# Patient Record
Sex: Male | Born: 1965
Health system: Southern US, Community
[De-identification: ages and names within clinical notes are randomized; demographics above are authoritative.]

## PROBLEM LIST (undated history)

## (undated) DIAGNOSIS — C801 Malignant (primary) neoplasm, unspecified: Secondary | ICD-10-CM

## (undated) DIAGNOSIS — I10 Essential (primary) hypertension: Secondary | ICD-10-CM

## (undated) DIAGNOSIS — C649 Malignant neoplasm of unspecified kidney, except renal pelvis: Secondary | ICD-10-CM

## (undated) DIAGNOSIS — G4730 Sleep apnea, unspecified: Secondary | ICD-10-CM

## (undated) DIAGNOSIS — N289 Disorder of kidney and ureter, unspecified: Secondary | ICD-10-CM

## (undated) DIAGNOSIS — E039 Hypothyroidism, unspecified: Secondary | ICD-10-CM

## (undated) HISTORY — DX: Disorder of kidney and ureter, unspecified: N28.9

## (undated) HISTORY — PX: TONSILLECTOMY: SUR1361

---

## 2019-08-05 HISTORY — PX: OTHER SURGICAL HISTORY: SHX169

## 2019-11-05 HISTORY — PX: LUNG BIOPSY: SHX232

## 2019-12-08 IMAGING — CT PET^[id number]_[ID] (ADULT)
1 of 3 series · 5 of 20 positions shown, 7 images · non-contrast
Comparison: none

____________________________________________

CEOLA [DATE] CEOLA [DATE]
REASON FOR CONSULTATION: F/U PE [PHONE_NUMBER]
EXAM: CHEST ROUTINE
No significant interval changes are seen as compared to a prior exam of
[DATE].
Again noted are multiple rib fracture deformities on the LEFT. A
moderate LEFT pleural effusion or LEFT hemothorax persists but does not
appear to have changed appreciably. Also noted is a small pneumothorax
on the RIGHT.

[Series 4: ct wb · axial · 0.98mm/px · z∈[+514,+1159]mm · 5 of 323 slices shown, 7 images]
[im 54/323  soft-tissue]
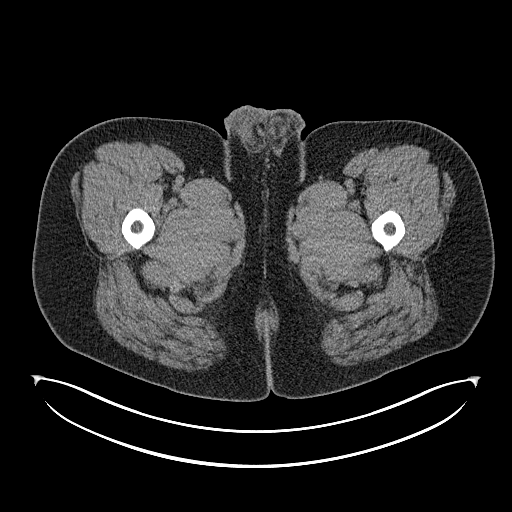
[im 54/323  bone]
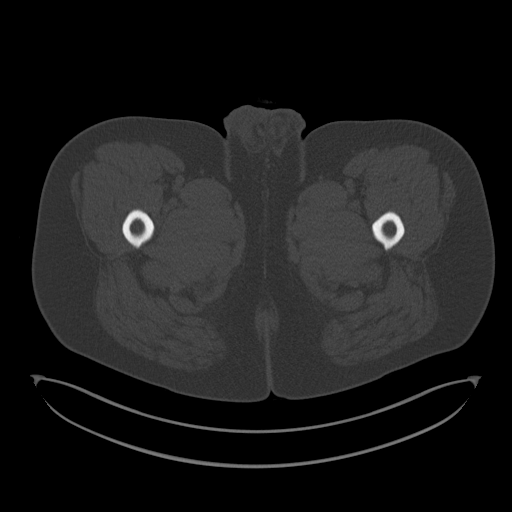
[im 108/323  bone]
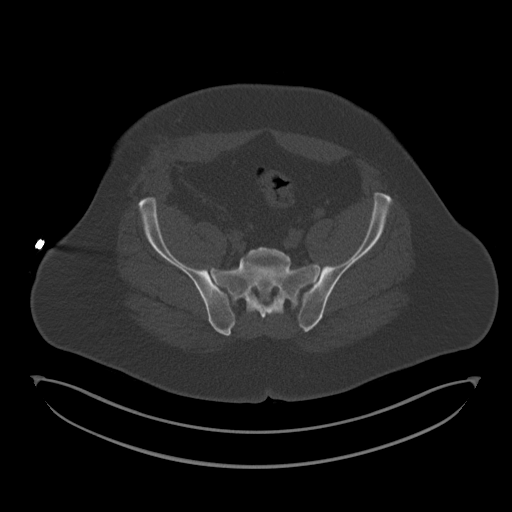
[im 162/323  bone]
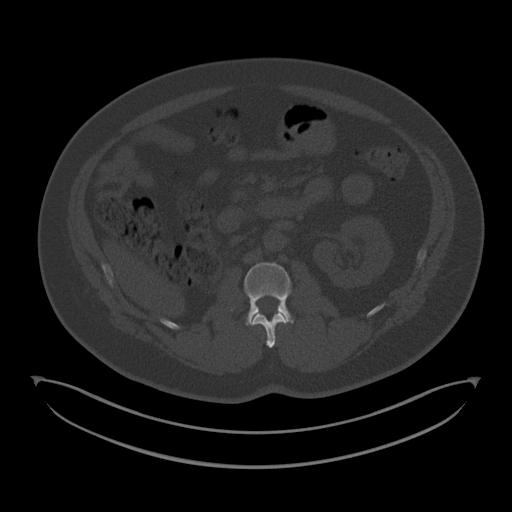
[im 215/323  bone]
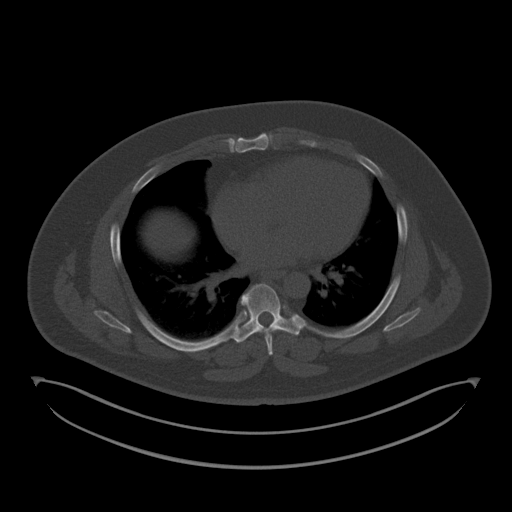
[im 269/323  soft-tissue]
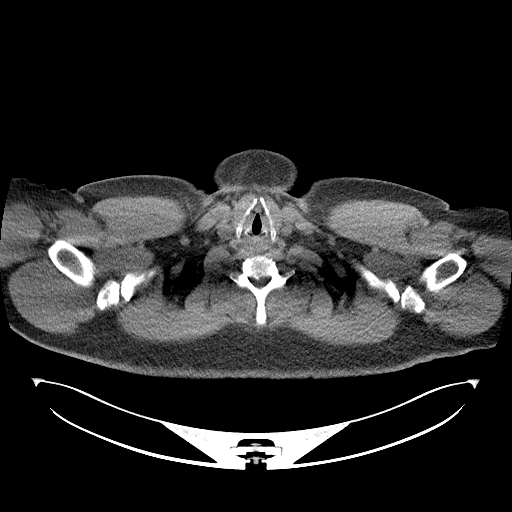
[im 269/323  bone]
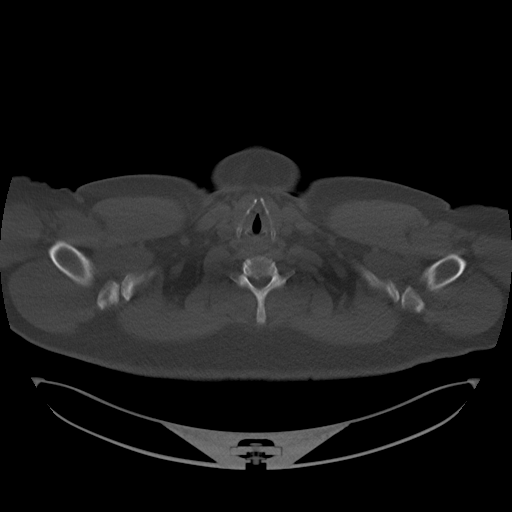

[5 of 20 positions shown; findings below may reference images not displayed]

CONCLUSION: Stable appearing chest as compared to the exam of 2 days
 ago.
 Multiple LEFT rib fractures are again seen.
 There are bilateral effusions with that on the LEFT being larger.

## 2020-05-26 ENCOUNTER — Telehealth: Payer: Self-pay | Admitting: Oncology

## 2020-05-26 NOTE — Telephone Encounter (Signed)
Received a new pt referral from [facility] in [place] for renal cell carcinoma. Michael [name] has been cld and scheduled to see Dr. Alen Blew on 7/29 at 2pm. Pt aware to arrive 15 minutes early.

## 2020-06-01 ENCOUNTER — Other Ambulatory Visit: Payer: Self-pay

## 2020-06-01 ENCOUNTER — Inpatient Hospital Stay: Payer: BC Managed Care – PPO | Attending: Oncology | Admitting: Oncology

## 2020-06-01 VITALS — BP 142/91 | HR 76 | Temp 97.9°F | Resp 18 | Ht 66.0 in | Wt 250.0 lb

## 2020-06-01 DIAGNOSIS — C649 Malignant neoplasm of unspecified kidney, except renal pelvis: Secondary | ICD-10-CM | POA: Diagnosis not present

## 2020-06-01 MED ORDER — PREDNISONE 5 MG PO TABS
5.0000 mg | ORAL_TABLET | Freq: Every day | ORAL | 0 refills | Status: DC
Start: 2020-06-01 — End: 2021-05-17

## 2020-06-01 NOTE — Progress Notes (Signed)
Reason for the request:    Renal cell carcinoma  HPI: I was asked by Dr. Michel Harrow to evaluate [name] for the diagnosis of advanced renal cell carcinoma.  He is a 54 year old man found to have a right kidney mass on a CT scan in September 2020.  At that time the right kidney mass was measuring 7.7 x 7.5 x 4.8 cm with central necrosis.  He underwent a right nephrectomy on August 27, 2019 performed while he was living in [place].  The final pathology showed clear cell renal cell carcinoma, nuclear grade 3 with 10% necrosis negative margins.  Final pathological stage was T2ANX.  He remained on active surveillance at that time and developed lymphadenopathy based on his chest CT scan in January 2021.  He had a right paratracheal and bilateral hilar adenopathy.  PET CT scan in February 2021 showed FDG avid disease although inflammation could not be ruled out.  He underwent a bronchoscopy and EBUS and biopsy at that time showed a metastatic renal cell carcinoma.  CT scan in March 2021 showed a 1.2 cm perihilar left lower lobe nodule multiple small pulmonary nodules as well as his lymphadenopathy.  On January 28, 2020 he was started on axitinib 5 mg twice daily with Pembrolizumab.  On April 16 he was evaluated and found to have elevated liver function tests and treatment was withheld at that time.  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  In June 2021 his AST was down to 31 ALT of 77 and was prednisone was tapered almost to off.  He relocated from [place] to this area and here to establish care.   Clinically, he reports no major complaints at this time.  He denies any nausea, vomiting or abdominal pain.  He denies any worsening shortness of breath or difficulty breathing.  He does not report any headaches, blurry vision, syncope or seizures. Does not report any fevers, chills or sweats.  Does not report any cough, wheezing or hemoptysis.  Does not report any chest  pain, palpitation, orthopnea or leg edema.  Does not report any nausea, vomiting or abdominal pain.  Does not report any constipation or diarrhea.  Does not report any skeletal complaints.    Does not report frequency, urgency or hematuria.  Does not report any skin rashes or lesions. Does not report any heat or cold intolerance.  Does not report any lymphadenopathy or petechiae.  Does not report any anxiety or depression.  Remaining review of systems is negative.    Past medical history significant for hypertension and hypothyroidism.     Medication: He is currently on amlodipine, prednisone 10 mg and levothyroxine.    No family with malignancy or blood disorder.  Social History   Socioeconomic History  . Marital status: Married    Spouse name: Not on file  . Number of children: Not on file  . Years of education: Not on file  . Highest education level: Not on file  Occupational History  . Not on file  Tobacco Use  . Smoking status: Not on file  Substance and Sexual Activity  . Alcohol use: Not on file  . Drug use: Not on file  . Sexual activity: Not on file  Other Topics Concern  . Not on file  Social History Narrative  . Not on file   Social Determinants of Health   Financial Resource Strain:   . Difficulty of Paying Living Expenses:   Food Insecurity:   .  Worried About Charity fundraiser in the Last Year:   . Arboriculturist in the Last Year:   Transportation Needs:   . Film/video editor (Medical):   Marland Kitchen Lack of Transportation (Non-Medical):   Physical Activity:   . Days of Exercise per Week:   . Minutes of Exercise per Session:   Stress:   . Feeling of Stress :   Social Connections:   . Frequency of Communication with Friends and Family:   . Frequency of Social Gatherings with Friends and Family:   . Attends Religious Services:   . Active Member of Clubs or Organizations:   . Attends Archivist Meetings:   Marland Kitchen Marital Status:   Intimate Partner  Violence:   . Fear of Current or Ex-Partner:   . Emotionally Abused:   Marland Kitchen Physically Abused:   . Sexually Abused:   :  Pertinent items are noted in HPI.  Exam: Blood pressure (!) 142/91, pulse 76, temperature 97.9 F (36.6 C), temperature source Temporal, resp. rate 18, height 5\' 6"  (1.676 m), weight (!) 250 lb (113.4 kg), SpO2 98 %.  ECOG 0 General appearance: alert and cooperative appeared without distress. Head: atraumatic without any abnormalities. Eyes: conjunctivae/corneas clear. PERRL.  Sclera anicteric. Throat: lips, mucosa, and tongue normal; without oral thrush or ulcers. Resp: clear to auscultation bilaterally without rhonchi, wheezes or dullness to percussion. Cardio: regular rate and rhythm, S1, S2 normal, no murmur, click, rub or gallop GI: soft, non-tender; bowel sounds normal; no masses,  no organomegaly Skin: Skin color, texture, turgor normal. No rashes or lesions Lymph nodes: Cervical, supraclavicular, and axillary nodes normal. Neurologic: Grossly normal without any motor, sensory or deep tendon reflexes. Musculoskeletal: No joint deformity or effusion.    Assessment and Plan:   54 year old with:  1.  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  He was initially diagnosed with large kidney mass and nephrectomy in October 2020 showed clear-cell histology with localized disease and T2 lesion.  He was started therapy with axitinib and Pembrolizumab although therapy was interrupted in April 2021 after increase in his liver function tests of therapy was withheld and was treated with prednisone taper.  The natural course of this disease and treatment options were discussed.  His options would include restarting his therapy with Pembrolizumab alone, Pembrolizumab and axitinib, axitinib alone or combination of immunotherapy with ipilimumab and nivolumab.     Risks and benefits of all these options were discussed at this time and after discussion, the plan is to repeat imaging studies to update his staging work-up.  If his liver function tests has not normalized I will be open to rechallenging him with Pembrolizumab and lower dose of axitinib.  He did experience hand-foot syndrome as well as mucositis and weight loss associated with axitinib and will consider restarting it at a lower dose pending the results of the scan.   2.  Immune mediated complications: He did develop hepatitis which appears to have resolved at this time.  I have recommended tapering his prednisone completely.  He will take 5 mg daily for a week and then discontinue.  Other complications that include pneumonitis, colitis, thyroid disease will be monitored periodically.  3.  Hypertension: He is currently on amlodipine blood pressure manageable at this time.  4.  Goals of care and prognosis: Therapy is palliative at this time.  Curative options do not exist.  Aggressive measures are warranted given his excellent performance status.  5.  Follow-up: Will be in the near future after completing his staging work-up.  60  minutes were dedicated to this visit. The time was spent on reviewing laboratory data, imaging studies, discussing treatment options, and answering questions regarding future plan.     A copy of this consult has been forwarded to the requesting physician.

## 2020-06-02 ENCOUNTER — Telehealth: Payer: Self-pay | Admitting: Oncology

## 2020-06-02 NOTE — Telephone Encounter (Signed)
Scheduled appointment per 7/30 scheduling message. Patient is aware of appointment date and time.

## 2020-06-08 ENCOUNTER — Other Ambulatory Visit: Payer: BC Managed Care – PPO

## 2020-06-12 ENCOUNTER — Other Ambulatory Visit: Payer: Self-pay

## 2020-06-12 ENCOUNTER — Ambulatory Visit: Payer: BC Managed Care – PPO | Admitting: Oncology

## 2020-06-12 ENCOUNTER — Inpatient Hospital Stay: Payer: BC Managed Care – PPO | Attending: Oncology

## 2020-06-12 DIAGNOSIS — J984 Other disorders of lung: Secondary | ICD-10-CM | POA: Insufficient documentation

## 2020-06-12 DIAGNOSIS — C649 Malignant neoplasm of unspecified kidney, except renal pelvis: Secondary | ICD-10-CM

## 2020-06-12 DIAGNOSIS — C641 Malignant neoplasm of right kidney, except renal pelvis: Secondary | ICD-10-CM | POA: Insufficient documentation

## 2020-06-12 DIAGNOSIS — Z905 Acquired absence of kidney: Secondary | ICD-10-CM | POA: Insufficient documentation

## 2020-06-12 DIAGNOSIS — Z880 Allergy status to penicillin: Secondary | ICD-10-CM | POA: Diagnosis not present

## 2020-06-12 DIAGNOSIS — R7989 Other specified abnormal findings of blood chemistry: Secondary | ICD-10-CM | POA: Diagnosis not present

## 2020-06-12 DIAGNOSIS — I700 Atherosclerosis of aorta: Secondary | ICD-10-CM | POA: Diagnosis not present

## 2020-06-12 DIAGNOSIS — R911 Solitary pulmonary nodule: Secondary | ICD-10-CM | POA: Insufficient documentation

## 2020-06-12 DIAGNOSIS — K754 Autoimmune hepatitis: Secondary | ICD-10-CM | POA: Insufficient documentation

## 2020-06-12 DIAGNOSIS — I10 Essential (primary) hypertension: Secondary | ICD-10-CM | POA: Insufficient documentation

## 2020-06-12 DIAGNOSIS — Z79899 Other long term (current) drug therapy: Secondary | ICD-10-CM | POA: Diagnosis not present

## 2020-06-12 LAB — CMP (CANCER CENTER ONLY)
ALT: 30 U/L (ref 0–44)
AST: 26 U/L (ref 15–41)
Albumin: 3.8 g/dL (ref 3.5–5.0)
Alkaline Phosphatase: 78 U/L (ref 38–126)
Anion gap: 7 (ref 5–15)
BUN: 14 mg/dL (ref 6–20)
CO2: 28 mmol/L (ref 22–32)
Calcium: 9.4 mg/dL (ref 8.9–10.3)
Chloride: 106 mmol/L (ref 98–111)
Creatinine: 1.49 mg/dL — ABNORMAL HIGH (ref 0.61–1.24)
GFR, Est AFR Am: >60 mL/min (ref >60)
GFR, Estimated: 53 mL/min — ABNORMAL LOW (ref >60)
Glucose, Bld: 94 mg/dL (ref 70–99)
Potassium: 4.3 mmol/L (ref 3.5–5.1)
Sodium: 141 mmol/L (ref 135–145)
Total Bilirubin: 0.8 mg/dL (ref 0.3–1.2)
Total Protein: 6.8 g/dL (ref 6.5–8.1)

## 2020-06-12 LAB — CBC WITH DIFFERENTIAL (CANCER CENTER ONLY)
Abs Immature Granulocytes: 0.04 10*3/uL (ref 0.00–0.07)
Basophils Absolute: 0 10*3/uL (ref 0.0–0.1)
Basophils Relative: 1 %
Eosinophils Absolute: 0.2 10*3/uL (ref 0.0–0.5)
Eosinophils Relative: 3 %
HCT: 43.8 % (ref 39.0–52.0)
Hemoglobin: 14.7 g/dL (ref 13.0–17.0)
Immature Granulocytes: 1 %
Lymphocytes Relative: 33 %
Lymphs Abs: 2.4 10*3/uL (ref 0.7–4.0)
MCH: 30.8 pg (ref 26.0–34.0)
MCHC: 33.6 g/dL (ref 30.0–36.0)
MCV: 91.8 fL (ref 80.0–100.0)
Monocytes Absolute: 0.6 10*3/uL (ref 0.1–1.0)
Monocytes Relative: 9 %
Neutro Abs: 4 10*3/uL (ref 1.7–7.7)
Neutrophils Relative %: 53 %
Platelet Count: 222 10*3/uL (ref 150–400)
RBC: 4.77 MIL/uL (ref 4.22–5.81)
RDW: 15.1 % (ref 11.5–15.5)
WBC Count: 7.3 10*3/uL (ref 4.0–10.5)
nRBC: 0 % (ref 0.0–0.2)

## 2020-06-12 LAB — TSH: TSH: 82.301 u[IU]/mL — ABNORMAL HIGH (ref 0.320–4.118)

## 2020-06-13 ENCOUNTER — Ambulatory Visit (HOSPITAL_COMMUNITY)
Admission: RE | Admit: 2020-06-13 | Discharge: 2020-06-13 | Disposition: A | Discharge status: Home / Self Care | Payer: BC Managed Care – PPO | Source: Ambulatory Visit | Attending: Oncology | Admitting: Oncology

## 2020-06-13 ENCOUNTER — Encounter (HOSPITAL_COMMUNITY): Payer: Self-pay

## 2020-06-13 DIAGNOSIS — R918 Other nonspecific abnormal finding of lung field: Secondary | ICD-10-CM | POA: Diagnosis not present

## 2020-06-13 DIAGNOSIS — C7800 Secondary malignant neoplasm of unspecified lung: Secondary | ICD-10-CM | POA: Diagnosis not present

## 2020-06-13 DIAGNOSIS — C649 Malignant neoplasm of unspecified kidney, except renal pelvis: Secondary | ICD-10-CM | POA: Insufficient documentation

## 2020-06-13 DIAGNOSIS — K7689 Other specified diseases of liver: Secondary | ICD-10-CM | POA: Diagnosis not present

## 2020-06-13 DIAGNOSIS — R590 Localized enlarged lymph nodes: Secondary | ICD-10-CM | POA: Diagnosis not present

## 2020-06-13 DIAGNOSIS — I700 Atherosclerosis of aorta: Secondary | ICD-10-CM | POA: Diagnosis not present

## 2020-06-13 MED ORDER — IOHEXOL 300 MG/ML  SOLN
100.0000 mL | Freq: Once | INTRAMUSCULAR | Status: AC | PRN
  Administered 2020-06-13: 100 mL via INTRAVENOUS

## 2020-06-13 MED ORDER — SODIUM CHLORIDE (PF) 0.9 % IJ SOLN
INTRAMUSCULAR | Status: AC
  Filled 2020-06-13: qty 50

## 2020-06-14 ENCOUNTER — Other Ambulatory Visit: Payer: Self-pay

## 2020-06-14 ENCOUNTER — Inpatient Hospital Stay (HOSPITAL_BASED_OUTPATIENT_CLINIC_OR_DEPARTMENT_OTHER): Payer: BC Managed Care – PPO | Admitting: Oncology

## 2020-06-14 VITALS — BP 136/83 | HR 85 | Temp 97.8°F | Resp 18 | Ht 66.0 in | Wt 258.5 lb

## 2020-06-14 DIAGNOSIS — C649 Malignant neoplasm of unspecified kidney, except renal pelvis: Secondary | ICD-10-CM | POA: Diagnosis not present

## 2020-06-14 DIAGNOSIS — Z905 Acquired absence of kidney: Secondary | ICD-10-CM | POA: Diagnosis not present

## 2020-06-14 DIAGNOSIS — K754 Autoimmune hepatitis: Secondary | ICD-10-CM | POA: Diagnosis not present

## 2020-06-14 DIAGNOSIS — I10 Essential (primary) hypertension: Secondary | ICD-10-CM | POA: Diagnosis not present

## 2020-06-14 DIAGNOSIS — R911 Solitary pulmonary nodule: Secondary | ICD-10-CM | POA: Diagnosis not present

## 2020-06-14 DIAGNOSIS — I700 Atherosclerosis of aorta: Secondary | ICD-10-CM | POA: Diagnosis not present

## 2020-06-14 DIAGNOSIS — Z880 Allergy status to penicillin: Secondary | ICD-10-CM | POA: Diagnosis not present

## 2020-06-14 DIAGNOSIS — R7989 Other specified abnormal findings of blood chemistry: Secondary | ICD-10-CM | POA: Diagnosis not present

## 2020-06-14 DIAGNOSIS — J984 Other disorders of lung: Secondary | ICD-10-CM | POA: Diagnosis not present

## 2020-06-14 DIAGNOSIS — C641 Malignant neoplasm of right kidney, except renal pelvis: Secondary | ICD-10-CM | POA: Diagnosis not present

## 2020-06-14 DIAGNOSIS — Z79899 Other long term (current) drug therapy: Secondary | ICD-10-CM | POA: Diagnosis not present

## 2020-06-14 MED ORDER — LEVOTHYROXINE SODIUM 75 MCG PO TABS
75.0000 ug | ORAL_TABLET | Freq: Every morning | ORAL | 3 refills | Status: DC
Start: 2020-06-14 — End: 2020-08-31

## 2020-06-14 NOTE — Progress Notes (Addendum)
Hematology and Oncology Follow Up Visit  Michael [name] [id number] 12-28-1965 54 y.o. 06/14/2020 3:40 PM System, Pcp Not InNo ref. provider found   Principle Diagnosis: 54 year old man with stage IV clear-cell renal cell carcinoma documented in March 2021.  He was found to have hilar adenopathy and pulmonary nodules.  He was initially diagnosed with localized disease in 2020.   Prior Therapy: He underwent a right nephrectomy on August 27, 2019 performed while he was living in [place].  The final pathology showed clear cell renal cell carcinoma, nuclear grade 3 with 10% necrosis negative margins.  Final pathological stage was T2ANX.  He is status post Pembrolizumab and axitinib started in March 2021.  He received two cycles of therapy.  Therapy was interrupted because of increased LFTs and autoimmune hepatitis.    He has been receiving his therapy in [place] and relocated to this area.  Current therapy: Under evaluation to start therapy.  Interim History: Michael [name] returns today for a follow-up visit.  Since last visit, he reports no major changes in his health.  He denies any recent nausea, fatigue or shortness of breath.  He denies any abdominal pain or dyspnea exertion.  His performance status and quality of life intact.     Medications: I have reviewed the patient's current medications.  Current Outpatient Medications  Medication Sig Dispense Refill  . amLODipine (NORVASC) 10 MG tablet Take 10 mg by mouth daily.    . Levothyroxine Sodium (SYNTHROID PO) Take 0.5 mg by mouth every morning.    . montelukast (SINGULAIR) 10 MG tablet Take 10 mg by mouth at bedtime.    . predniSONE (DELTASONE) 5 MG tablet Take 1 tablet (5 mg total) by mouth daily with breakfast. 7 tablet 0  . tadalafil (CIALIS) 20 MG tablet Take 20 mg by mouth daily as needed for erectile dysfunction.     No current facility-administered medications for this visit.     Allergies:  Allergies  Allergen Reactions  .  Penicillins       Physical Exam: Blood pressure 136/83, pulse 85, temperature 97.8 F (36.6 C), temperature source Axillary, resp. rate 18, height 5\' 6"  (1.676 m), weight 258 lb 8 oz (117.3 kg), SpO2 98 %.   ECOG: 0   General appearance: Comfortable appearing without any discomfort Head: Normocephalic without any trauma Oropharynx: Mucous membranes are moist and pink without any thrush or ulcers. Eyes: Pupils are equal and round reactive to light. Lymph nodes: No cervical, supraclavicular, inguinal or axillary lymphadenopathy.   Heart:regular rate and rhythm.  S1 and S2 without leg edema. Lung: Clear without any rhonchi or wheezes.  No dullness to percussion. Abdomin: Soft, nontender, nondistended with good bowel sounds.  No hepatosplenomegaly. Musculoskeletal: No joint deformity or effusion.  Full range of motion noted. Neurological: No deficits noted on motor, sensory and deep tendon reflex exam. Skin: No petechial rash or dryness.  Appeared moist.      Lab Results: Lab Results  Component Value Date   WBC 7.3 06/12/2020   HGB 14.7 06/12/2020   HCT 43.8 06/12/2020   MCV 91.8 06/12/2020   PLT 222 06/12/2020     Chemistry      Component Value Date/Time   NA 141 06/12/2020 0835   K 4.3 06/12/2020 0835   CL 106 06/12/2020 0835   CO2 28 06/12/2020 0835   BUN 14 06/12/2020 0835   CREATININE 1.49 (H) 06/12/2020 0835      Component Value Date/Time   CALCIUM 9.4 06/12/2020 0835  ALKPHOS 78 06/12/2020 0835   AST 26 06/12/2020 0835   ALT 30 06/12/2020 0835   BILITOT 0.8 06/12/2020 0835       Radiological Studies: CT Abdomen Pelvis W Wo Contrast  Result Date: 06/13/2020 CLINICAL DATA:  Right renal cell carcinoma, status post right nephrectomy. Pulmonary metastases. Previous care in [place] with no comparative imaging. EXAM: CT CHEST WITH CONTRAST CT ABDOMEN AND PELVIS WITH AND WITHOUT CONTRAST TECHNIQUE: Multidetector CT imaging of the chest was performed during  intravenous contrast administration. Multidetector CT imaging of the abdomen and pelvis was performed following the standard protocol before and during bolus administration of intravenous contrast. CONTRAST:  177mL OMNIPAQUE IOHEXOL 300 MG/ML  SOLN COMPARISON:  None. FINDINGS: CT CHEST FINDINGS Cardiovascular: Heart size upper normal. No pericardial effusion. Atherosclerotic calcification is noted in the wall of the thoracic aorta. Mediastinum/Nodes: No mediastinal lymphadenopathy. 14 mm short axis left inferior hilar lymph node visible on 52/11. Small lymph nodes are noted in the right hilum including 10 mm short axis right hilar node on 56/11. The esophagus has normal imaging features. There is no axillary lymphadenopathy. Lungs/Pleura: 4 mm anterior right upper lobe nodule visible on 55/15. 2 mm right middle lobe nodule identified on 90/15. 6 mm right lower lobe nodule identified on 102/15. 4 mm left lower lobe nodule identified on 01/30 2/15. No focal airspace consolidation No pleural effusion. Musculoskeletal: No worrisome lytic or sclerotic osseous abnormality. CT ABDOMEN AND PELVIS FINDINGS Hepatobiliary: Multiple low-density hepatic lesions are evident ranging in size from several mm up to about 2.8 cm. The more dominant lesions approach water density and are compatible with cysts. The smaller lesions are too small to characterize but likely benign. No overtly suspicious enhancing abnormality within the liver parenchyma. There is no evidence for gallstones, gallbladder wall thickening, or pericholecystic fluid. No intrahepatic or extrahepatic biliary dilation. Pancreas: No focal mass lesion. No dilatation of the main duct. No intraparenchymal cyst. No peripancreatic edema. Spleen: No splenomegaly. No focal mass lesion. Adrenals/Urinary Tract: No adrenal nodule or mass. Right nephrectomy without unexpected or suspicious soft tissue in the nephrectomy bed. Left kidney unremarkable. Left ureter has normal CT  imaging features. The urinary bladder appears normal for the degree of distention. Stomach/Bowel: Stomach is unremarkable. No gastric wall thickening. No evidence of outlet obstruction. Duodenum is normally positioned as is the ligament of Treitz. No small bowel wall thickening. No small bowel dilatation. The terminal ileum is normal. The appendix is normal. No gross colonic mass. No colonic wall thickening. Vascular/Lymphatic: No abdominal aortic aneurysm. No abdominal aortic atherosclerotic calcification. There is no gastrohepatic or hepatoduodenal ligament lymphadenopathy. No retroperitoneal or mesenteric lymphadenopathy. No pelvic sidewall lymphadenopathy. Reproductive: The prostate gland and seminal vesicles are unremarkable. Other: No intraperitoneal free fluid. Musculoskeletal: No worrisome lytic or sclerotic osseous abnormality. IMPRESSION: 1. Multiple tiny bilateral pulmonary nodules measuring up to 6 mm. Given the history of renal cell carcinoma, metastatic disease is a consideration. 2. Mild left hilar lymphadenopathy, indeterminate. Small right hilar and mediastinal lymph nodes do not meet CT size criteria for pathologic enlargement. 3. Status post right nephrectomy without evidence for local recurrence. 4. Multiple low-density hepatic lesions, most consistent with cysts. The smaller lesions are too small to characterize but likely benign. Attention on follow-up recommended. 5. Aortic Atherosclerosis (ICD10-I70.0). Electronically Signed   By: Misty Stanley M.D.   On: 06/13/2020 09:36   CT Chest W Contrast  Result Date: 06/13/2020 CLINICAL DATA:  Right renal cell carcinoma, status post right nephrectomy. Pulmonary  metastases. Previous care in [place] with no comparative imaging. EXAM: CT CHEST WITH CONTRAST CT ABDOMEN AND PELVIS WITH AND WITHOUT CONTRAST TECHNIQUE: Multidetector CT imaging of the chest was performed during intravenous contrast administration. Multidetector CT imaging of the abdomen  and pelvis was performed following the standard protocol before and during bolus administration of intravenous contrast. CONTRAST:  14mL OMNIPAQUE IOHEXOL 300 MG/ML  SOLN COMPARISON:  None. FINDINGS: CT CHEST FINDINGS Cardiovascular: Heart size upper normal. No pericardial effusion. Atherosclerotic calcification is noted in the wall of the thoracic aorta. Mediastinum/Nodes: No mediastinal lymphadenopathy. 14 mm short axis left inferior hilar lymph node visible on 52/11. Small lymph nodes are noted in the right hilum including 10 mm short axis right hilar node on 56/11. The esophagus has normal imaging features. There is no axillary lymphadenopathy. Lungs/Pleura: 4 mm anterior right upper lobe nodule visible on 55/15. 2 mm right middle lobe nodule identified on 90/15. 6 mm right lower lobe nodule identified on 102/15. 4 mm left lower lobe nodule identified on 01/30 2/15. No focal airspace consolidation No pleural effusion. Musculoskeletal: No worrisome lytic or sclerotic osseous abnormality. CT ABDOMEN AND PELVIS FINDINGS Hepatobiliary: Multiple low-density hepatic lesions are evident ranging in size from several mm up to about 2.8 cm. The more dominant lesions approach water density and are compatible with cysts. The smaller lesions are too small to characterize but likely benign. No overtly suspicious enhancing abnormality within the liver parenchyma. There is no evidence for gallstones, gallbladder wall thickening, or pericholecystic fluid. No intrahepatic or extrahepatic biliary dilation. Pancreas: No focal mass lesion. No dilatation of the main duct. No intraparenchymal cyst. No peripancreatic edema. Spleen: No splenomegaly. No focal mass lesion. Adrenals/Urinary Tract: No adrenal nodule or mass. Right nephrectomy without unexpected or suspicious soft tissue in the nephrectomy bed. Left kidney unremarkable. Left ureter has normal CT imaging features. The urinary bladder appears normal for the degree of  distention. Stomach/Bowel: Stomach is unremarkable. No gastric wall thickening. No evidence of outlet obstruction. Duodenum is normally positioned as is the ligament of Treitz. No small bowel wall thickening. No small bowel dilatation. The terminal ileum is normal. The appendix is normal. No gross colonic mass. No colonic wall thickening. Vascular/Lymphatic: No abdominal aortic aneurysm. No abdominal aortic atherosclerotic calcification. There is no gastrohepatic or hepatoduodenal ligament lymphadenopathy. No retroperitoneal or mesenteric lymphadenopathy. No pelvic sidewall lymphadenopathy. Reproductive: The prostate gland and seminal vesicles are unremarkable. Other: No intraperitoneal free fluid. Musculoskeletal: No worrisome lytic or sclerotic osseous abnormality. IMPRESSION: 1. Multiple tiny bilateral pulmonary nodules measuring up to 6 mm. Given the history of renal cell carcinoma, metastatic disease is a consideration. 2. Mild left hilar lymphadenopathy, indeterminate. Small right hilar and mediastinal lymph nodes do not meet CT size criteria for pathologic enlargement. 3. Status post right nephrectomy without evidence for local recurrence. 4. Multiple low-density hepatic lesions, most consistent with cysts. The smaller lesions are too small to characterize but likely benign. Attention on follow-up recommended. 5. Aortic Atherosclerosis (ICD10-I70.0). Electronically Signed   By: Misty Stanley M.D.   On: 06/13/2020 09:36     Impression and Plan:  54 year old with:  1.    Right kidney cancer diagnosed in October 2020.  He developed stage IV clear-cell renal cell carcinoma documented in February 2021 with pulmonary involvement including a parenchymal small nodules as well as adenopathy.   He has received two cycles of therapy utilizing Pembrolizumab and axitinib was therapy discontinued and April 2021 after developing acute autoimmune hepatitis and complications of  anorexia and hand-foot syndrome from  axitinib.  CT scan obtained on 06/13/2020 for staging purposes were reviewed today which showed significant improvement in his disease including regression of his pulmonary adenopathy and pulmonary nodules.  Treatment options were discussed at this time including restarting Pembrolizumab alone, Pembrolizumab and axitinib versus continued active surveillance.  The plan at this point is to consult with Dr. Latanya Presser based on the patient's request prior to proceeding with treatment and he is agreeable to proceed.   2.  Immune mediated complications: His liver function test has normalized although his TSH remains elevated and will increase his Synthroid dose to 75 mcg.  3.  Hypertension:  Continues to be under control with amlodipine.  Blood pressure is adequately controlled.  4.  Goals of care and prognosis: His disease is incurable although aggressive measures are warranted given his excellent performance status.  5.  Follow-up:  Will be in the near future to start therapy.  30  minutes were spent on this encounter.  The time was dedicated to reviewing his disease status, reviewing imaging studies and outlining future plan of care.     Zola Button, MD 8/11/20213:40 PM    Addendum on 06/15/2020: His case was discussed with Dr. Latanya Presser and after discussion, we opted to restart axitinib first at a lower dose of 5 mg daily and assess his LFTs in 2 to 3 weeks.  If he tolerates it well then we will add Pembrolizumab at that time at a full dose and monitor his labs weekly.  This was discussed with him and he is agreeable to proceed.

## 2020-06-15 ENCOUNTER — Telehealth: Payer: Self-pay | Admitting: Pharmacist

## 2020-06-15 ENCOUNTER — Telehealth: Payer: Self-pay | Admitting: Oncology

## 2020-06-15 DIAGNOSIS — C649 Malignant neoplasm of unspecified kidney, except renal pelvis: Secondary | ICD-10-CM

## 2020-06-15 MED ORDER — AXITINIB 5 MG PO TABS
5.0000 mg | ORAL_TABLET | Freq: Every day | ORAL | 0 refills | Status: DC
Start: 2020-06-15 — End: 2020-06-21

## 2020-06-15 NOTE — Addendum Note (Signed)
Addended by: Wyatt Portela on: 06/15/2020 10:01 AM   Modules accepted: Orders

## 2020-06-15 NOTE — Telephone Encounter (Signed)
Scheduled appointments per 8/12 scheduling message. Patient is aware of appointments dates and times.

## 2020-06-15 NOTE — Telephone Encounter (Signed)
Oral Oncology Pharmacist Encounter  Received new prescription for reinitiation of Inlyta (axitinib) for the treatment of stage IV clear-cell renal cell carcinoma in conjunction with pembrolizumab, planned duration until disease progression or unacceptable drug toxicity.  Prescription dose and frequency assessed for appropriateness. Patient will be started on once daily dosing of Inlyta versus the typical twice daily dosing due to issues with anorexia and hand-foot syndrome with Inlyta.  CBC w/ Diff, CMP and TSH from 06/12/2020 assessed, Scr 1.49 mg/dL (CrCl ~96 mL/min), likely secondary to disease - no dose adjustments required. TSH elevated (82.301) - patient started on levothyroxine 75 mcg/d  Current medication list in Epic reviewed, no relevant/significant DDIs with Inlyta identified.  Evaluated chart and no patient barriers to medication adherence noted.   Prescription has been e-scribed to the [facility] for benefits analysis and approval.  Oral Oncology Clinic will continue to follow for insurance authorization, copayment issues, initial counseling and start date.  Leron Croak, PharmD, BCPS Hematology/Oncology Clinical Pharmacist [facility] [phone] 06/15/2020 10:33 AM

## 2020-06-20 ENCOUNTER — Telehealth: Payer: Self-pay

## 2020-06-20 NOTE — Telephone Encounter (Signed)
PROGRESS NOTE: Faxed signed forms from Dr Alen Blew for pt to [facility]ology to fax# [phone]. Fax came back as complete and placed in bin at nurse desk

## 2020-06-21 ENCOUNTER — Telehealth: Payer: Self-pay

## 2020-06-21 MED ORDER — AXITINIB 5 MG PO TABS: 5.0000 mg | ORAL_TABLET | Freq: Every day | ORAL | 0 refills | Status: DC

## 2020-06-21 NOTE — Telephone Encounter (Signed)
Oral Oncology Patient Advocate Encounter  Received notification from [place] that prior authorization for Inlyta is required.  PA submitted on CoverMyMeds Key [id number] Status is pending  Oral Oncology Clinic will continue to follow.  [place] [facility] [place] Phone [phone] Fax [phone] 06/21/2020 12:18 PM

## 2020-06-21 NOTE — Telephone Encounter (Signed)
PA request was cancelled due to a previous authorized PA good through 01/17/21  [place] [facility] [place] Phone [phone] Fax [phone] 06/21/2020 12:37 PM

## 2020-06-22 NOTE — Telephone Encounter (Signed)
Oral Chemotherapy Pharmacist Encounter   Attempted to reach patient to provide update and offer for counseling on oral medication: Inlyta (axitinib).  No answer.  Left voicemail for patient to call back to discuss details of medication acquisition and medication counseling session.  Leron Croak, PharmD, BCPS Hematology/Oncology Clinical Pharmacist [facility] [phone] 06/22/2020 10:16 AM

## 2020-06-22 NOTE — Telephone Encounter (Signed)
Oral Chemotherapy Pharmacist Encounter Inlyta is required to be filled through [place] - patient is aware [facility] will reach out to patient regarding shipment. Patient knows to start medication once it is delivered.   I spoke with patient for overview of: Inlyta (axitinib) for the treatment of stage IV clear-cell renal cell carcinoma, planned duration until disease progression or unacceptable toxicity.   Counseled patient on administration, dosing, side effects, monitoring, drug-food interactions, safe handling, storage, and disposal.  Patient will take Inlyta 5mg  tablets, 1 tablet by mouth once daily, without regard to food, with a glass of water.  Patient instructed to avoid grapefruit and grapefruit juice while on therapy with Inlyta.  Inlyta start date: Patient instructed to start medication once received from [facility]  Adverse effects include but are not limited to: hypertension, hand-foot syndrome, nausea, vomiting, diarrhea, fatigue, dysphonia (hoarseness), and abnormal laboratory values.   Patient will obtain anti diarrheal and alert the office of 4 or more loose stools above baseline.  Inlyta will be held at least 24 hours prior to surgery and resumed at discretion of treating physician based on wound healing  Reviewed with patient importance of keeping a medication schedule and plan for any missed doses. No barriers to medication adherence identified.  Medication reconciliation performed and medication/allergy list updated.  All questions answered.  Mr. Rhinehart voiced understanding and appreciation.   Patient knows to call the office with questions or concerns.  Leron Croak, PharmD, BCPS Hematology/Oncology Clinical Pharmacist [facility] [phone] 06/22/2020 1:26 PM

## 2020-07-04 ENCOUNTER — Telehealth: Payer: Self-pay | Admitting: Oncology

## 2020-07-04 NOTE — Telephone Encounter (Signed)
Called pt per 8/31 sch msg - left message for patient with new appt date and time

## 2020-07-05 ENCOUNTER — Inpatient Hospital Stay: Payer: BC Managed Care – PPO

## 2020-07-06 ENCOUNTER — Inpatient Hospital Stay: Payer: BC Managed Care – PPO | Admitting: Oncology

## 2020-07-13 ENCOUNTER — Emergency Department (HOSPITAL_COMMUNITY)
Admission: EM | Admit: 2020-07-13 | Discharge: 2020-07-13 | Disposition: A | Discharge status: Home / Self Care | Payer: BC Managed Care – PPO | Attending: Emergency Medicine | Admitting: Emergency Medicine

## 2020-07-13 ENCOUNTER — Other Ambulatory Visit: Payer: Self-pay

## 2020-07-13 ENCOUNTER — Emergency Department (HOSPITAL_COMMUNITY): Payer: BC Managed Care – PPO

## 2020-07-13 ENCOUNTER — Encounter (HOSPITAL_COMMUNITY): Payer: Self-pay

## 2020-07-13 ENCOUNTER — Inpatient Hospital Stay: Payer: BC Managed Care – PPO | Attending: Oncology | Admitting: Oncology

## 2020-07-13 ENCOUNTER — Inpatient Hospital Stay: Payer: BC Managed Care – PPO

## 2020-07-13 VITALS — BP 104/76 | HR 80 | Temp 98.9°F | Resp 18 | Ht 66.0 in | Wt 255.9 lb

## 2020-07-13 DIAGNOSIS — R7401 Elevation of levels of liver transaminase levels: Secondary | ICD-10-CM | POA: Insufficient documentation

## 2020-07-13 DIAGNOSIS — R109 Unspecified abdominal pain: Secondary | ICD-10-CM | POA: Diagnosis not present

## 2020-07-13 DIAGNOSIS — Z79899 Other long term (current) drug therapy: Secondary | ICD-10-CM | POA: Diagnosis not present

## 2020-07-13 DIAGNOSIS — E039 Hypothyroidism, unspecified: Secondary | ICD-10-CM | POA: Diagnosis not present

## 2020-07-13 DIAGNOSIS — Z20822 Contact with and (suspected) exposure to covid-19: Secondary | ICD-10-CM | POA: Diagnosis not present

## 2020-07-13 DIAGNOSIS — R5383 Other fatigue: Secondary | ICD-10-CM | POA: Diagnosis not present

## 2020-07-13 DIAGNOSIS — R509 Fever, unspecified: Secondary | ICD-10-CM

## 2020-07-13 DIAGNOSIS — R7989 Other specified abnormal findings of blood chemistry: Secondary | ICD-10-CM | POA: Diagnosis not present

## 2020-07-13 DIAGNOSIS — Z905 Acquired absence of kidney: Secondary | ICD-10-CM | POA: Diagnosis not present

## 2020-07-13 DIAGNOSIS — K754 Autoimmune hepatitis: Secondary | ICD-10-CM | POA: Insufficient documentation

## 2020-07-13 DIAGNOSIS — R079 Chest pain, unspecified: Secondary | ICD-10-CM | POA: Diagnosis not present

## 2020-07-13 DIAGNOSIS — I10 Essential (primary) hypertension: Secondary | ICD-10-CM | POA: Diagnosis not present

## 2020-07-13 DIAGNOSIS — C641 Malignant neoplasm of right kidney, except renal pelvis: Secondary | ICD-10-CM | POA: Insufficient documentation

## 2020-07-13 DIAGNOSIS — R1011 Right upper quadrant pain: Secondary | ICD-10-CM | POA: Diagnosis not present

## 2020-07-13 DIAGNOSIS — K820 Obstruction of gallbladder: Secondary | ICD-10-CM | POA: Diagnosis not present

## 2020-07-13 DIAGNOSIS — C649 Malignant neoplasm of unspecified kidney, except renal pelvis: Secondary | ICD-10-CM | POA: Diagnosis not present

## 2020-07-13 DIAGNOSIS — K7689 Other specified diseases of liver: Secondary | ICD-10-CM | POA: Diagnosis not present

## 2020-07-13 DIAGNOSIS — Z880 Allergy status to penicillin: Secondary | ICD-10-CM | POA: Insufficient documentation

## 2020-07-13 DIAGNOSIS — Z7952 Long term (current) use of systemic steroids: Secondary | ICD-10-CM | POA: Diagnosis not present

## 2020-07-13 HISTORY — DX: Malignant (primary) neoplasm, unspecified: C80.1

## 2020-07-13 LAB — CBC WITH DIFFERENTIAL/PLATELET
Abs Immature Granulocytes: 0.02 10*3/uL (ref 0.00–0.07)
Basophils Absolute: 0.1 10*3/uL (ref 0.0–0.1)
Basophils Relative: 1 %
Eosinophils Absolute: 0.3 10*3/uL (ref 0.0–0.5)
Eosinophils Relative: 3 %
HCT: 42.4 % (ref 39.0–52.0)
Hemoglobin: 14.6 g/dL (ref 13.0–17.0)
Immature Granulocytes: 0 %
Lymphocytes Relative: 23 %
Lymphs Abs: 2.1 10*3/uL (ref 0.7–4.0)
MCH: 30.9 pg (ref 26.0–34.0)
MCHC: 34.4 g/dL (ref 30.0–36.0)
MCV: 89.6 fL (ref 80.0–100.0)
Monocytes Absolute: 1.1 10*3/uL — ABNORMAL HIGH (ref 0.1–1.0)
Monocytes Relative: 12 %
Neutro Abs: 5.6 10*3/uL (ref 1.7–7.7)
Neutrophils Relative %: 61 %
Platelets: 184 10*3/uL (ref 150–400)
RBC: 4.73 MIL/uL (ref 4.22–5.81)
RDW: 12.9 % (ref 11.5–15.5)
WBC: 9.2 10*3/uL (ref 4.0–10.5)
nRBC: 0 % (ref 0.0–0.2)

## 2020-07-13 LAB — SARS CORONAVIRUS 2 BY RT PCR (HOSPITAL ORDER, PERFORMED IN ~~LOC~~ HOSPITAL LAB): SARS Coronavirus 2: NEGATIVE

## 2020-07-13 LAB — COMPREHENSIVE METABOLIC PANEL
ALT: 533 U/L — ABNORMAL HIGH (ref 0–44)
AST: 337 U/L — ABNORMAL HIGH (ref 15–41)
Albumin: 4 g/dL (ref 3.5–5.0)
Alkaline Phosphatase: 75 U/L (ref 38–126)
Anion gap: 9 (ref 5–15)
BUN: 17 mg/dL (ref 6–20)
CO2: 23 mmol/L (ref 22–32)
Calcium: 8.9 mg/dL (ref 8.9–10.3)
Chloride: 101 mmol/L (ref 98–111)
Creatinine, Ser: 1.61 mg/dL — ABNORMAL HIGH (ref 0.61–1.24)
GFR calc Af Amer: 56 mL/min — ABNORMAL LOW (ref >60)
GFR calc non Af Amer: 48 mL/min — ABNORMAL LOW (ref >60)
Glucose, Bld: 119 mg/dL — ABNORMAL HIGH (ref 70–99)
Potassium: 3.6 mmol/L (ref 3.5–5.1)
Sodium: 133 mmol/L — ABNORMAL LOW (ref 135–145)
Total Bilirubin: 1.5 mg/dL — ABNORMAL HIGH (ref 0.3–1.2)
Total Protein: 7.6 g/dL (ref 6.5–8.1)

## 2020-07-13 LAB — URINALYSIS, ROUTINE W REFLEX MICROSCOPIC
Bacteria, UA: NONE SEEN
Bilirubin Urine: NEGATIVE
Glucose, UA: NEGATIVE mg/dL
Ketones, ur: NEGATIVE mg/dL
Leukocytes,Ua: NEGATIVE
Nitrite: NEGATIVE
Protein, ur: 30 mg/dL — AB
Specific Gravity, Urine: 1.023 (ref 1.005–1.030)
pH: 5 (ref 5.0–8.0)

## 2020-07-13 LAB — LACTIC ACID, PLASMA: Lactic Acid, Venous: 1 mmol/L (ref 0.5–1.9)

## 2020-07-13 IMAGING — DX DG CHEST 1V PORT
1 series · 1 of 1 positions shown · non-contrast
Comparison: None.

CLINICAL DATA: Fever beginning earlier today. Renal cancer patient.

EXAM:
PORTABLE CHEST 1 VIEW

[chest ap]
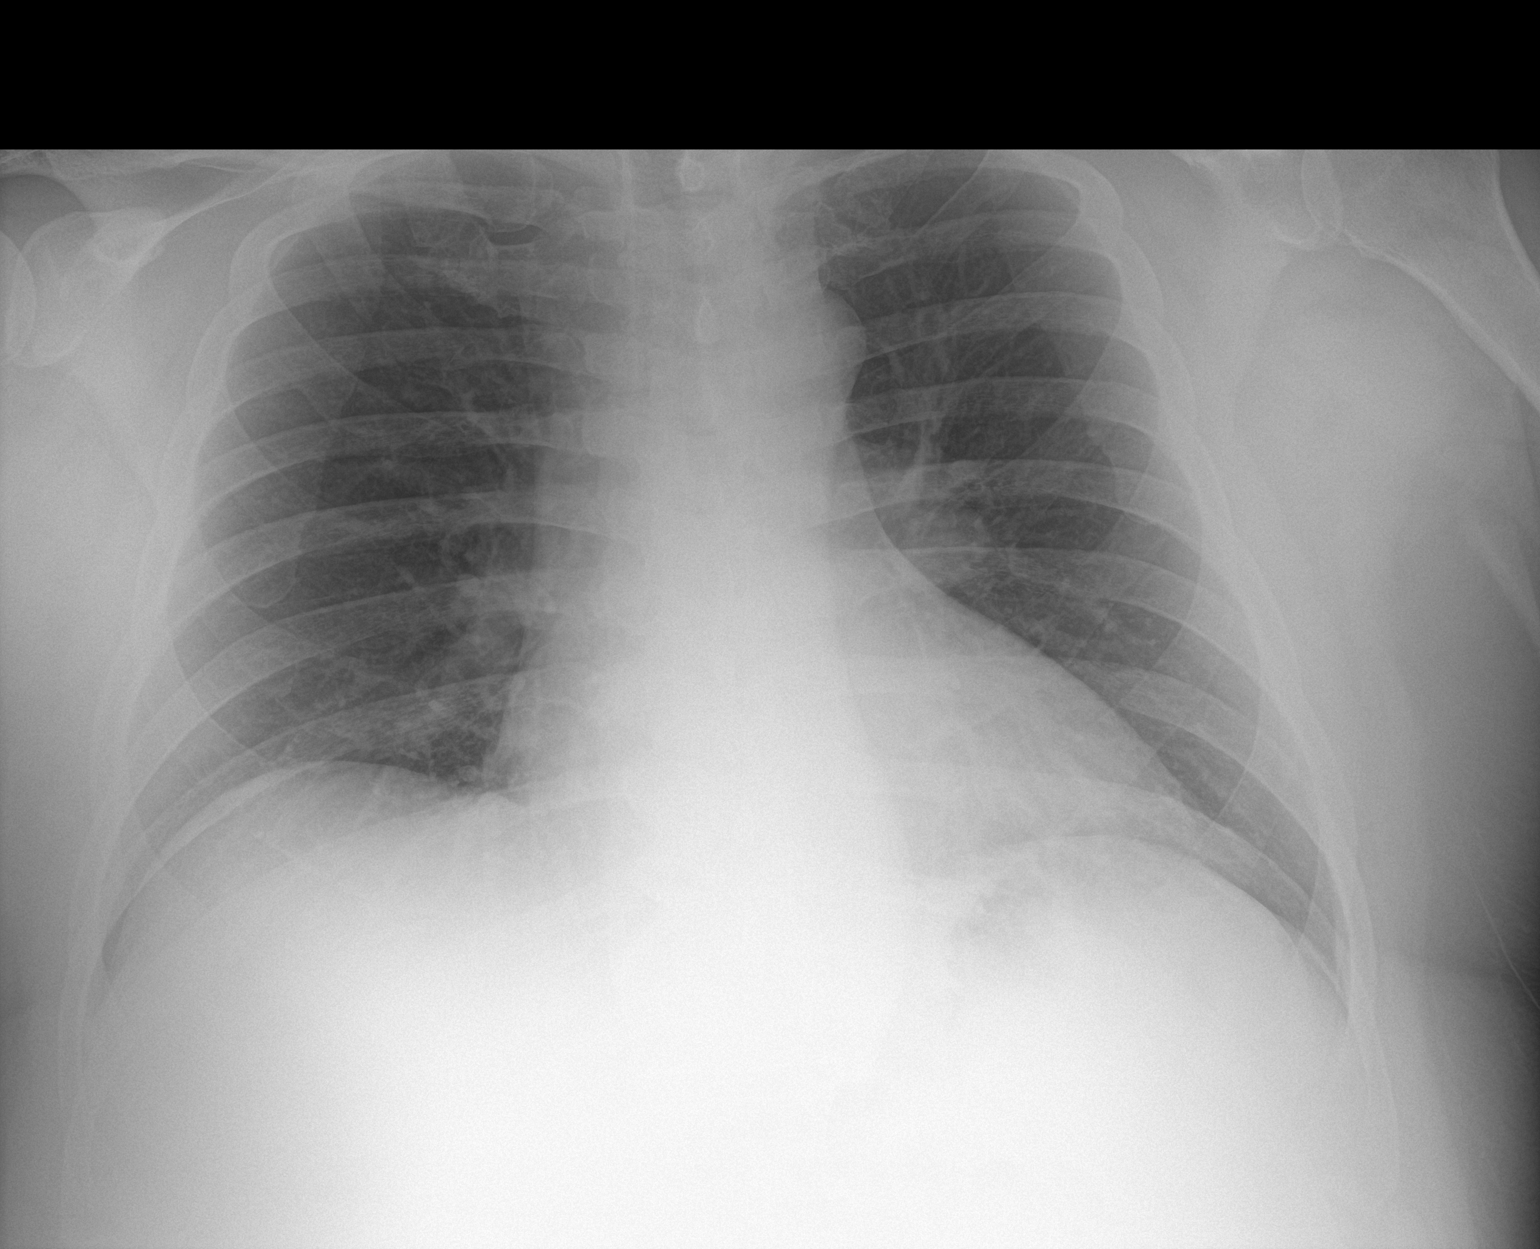

[1 of 1 positions shown; findings below may reference images not displayed]

FINDINGS: Shallow inspiration. Heart size is normal for technique. No vascular
congestion, edema, or consolidation. No pleural effusions. No
pneumothorax. Mediastinal contours appear intact.
IMPRESSION: No active disease.

## 2020-07-13 IMAGING — US US ABDOMEN LIMITED
1 series · 14 of 25 positions shown · non-contrast
Comparison: Abdominal CT from earlier today

CLINICAL DATA: Right upper quadrant pain

EXAM:
ULTRASOUND ABDOMEN LIMITED RIGHT UPPER QUADRANT

[Series 1: us abdomen limited · 14 of 85 slices shown]
[im 1/85]
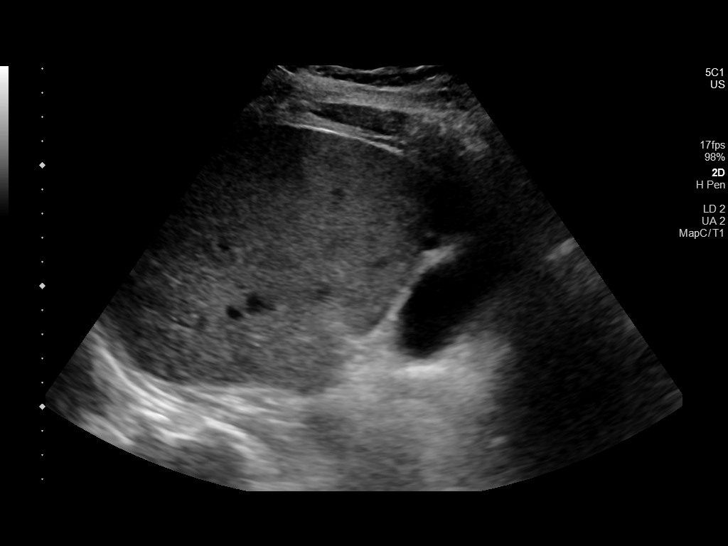
[im 8/85]
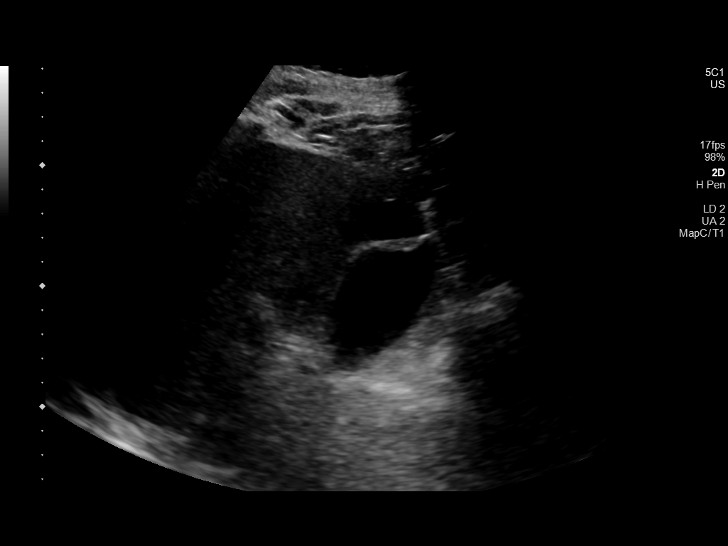
[im 15/85]
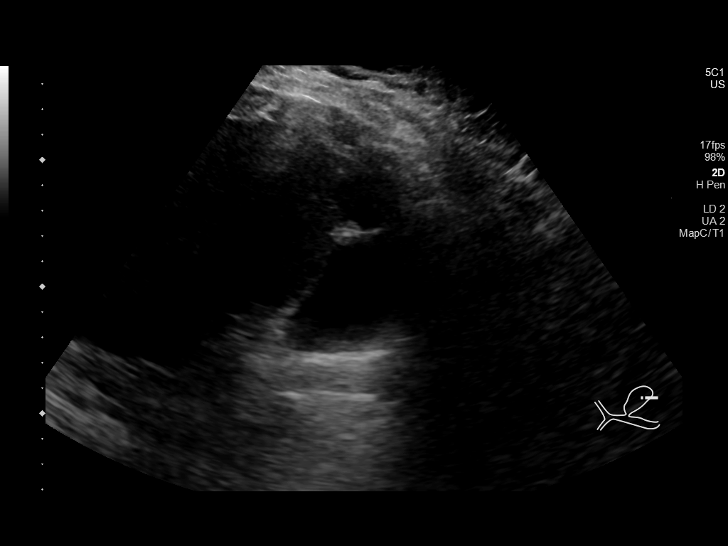
[im 22/85]
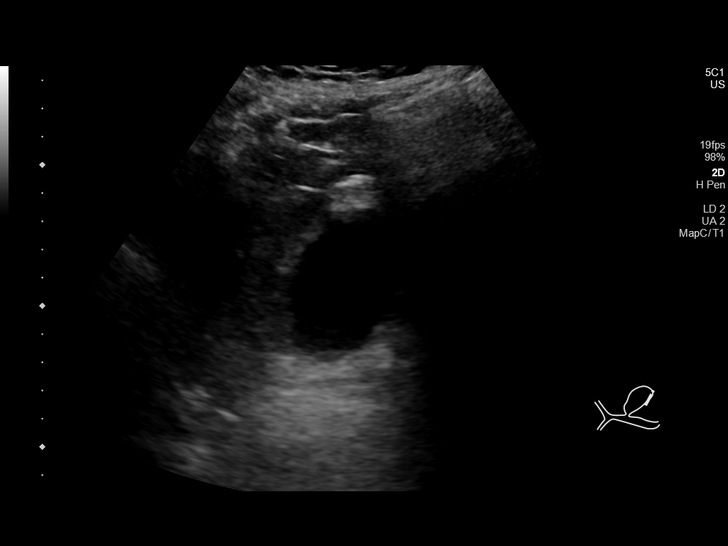
[im 29/85]
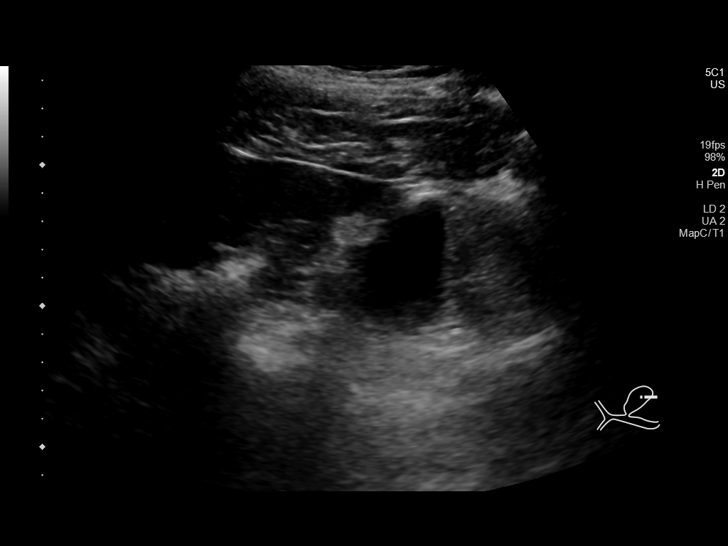
[im 32/85]
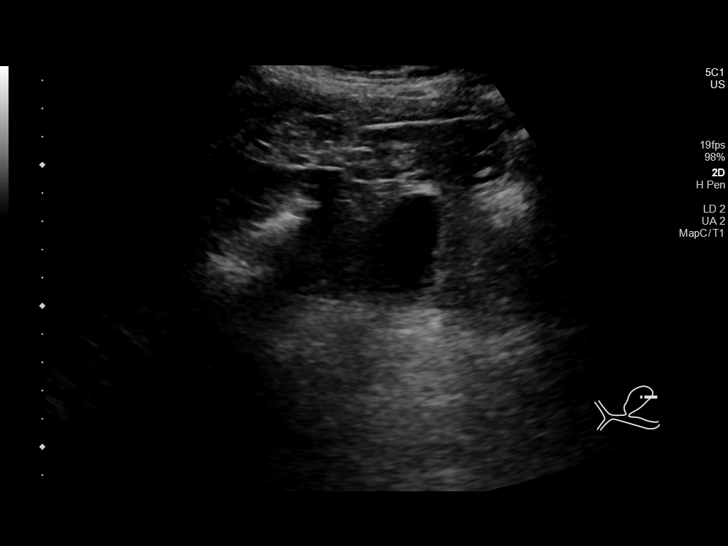
[im 39/85]
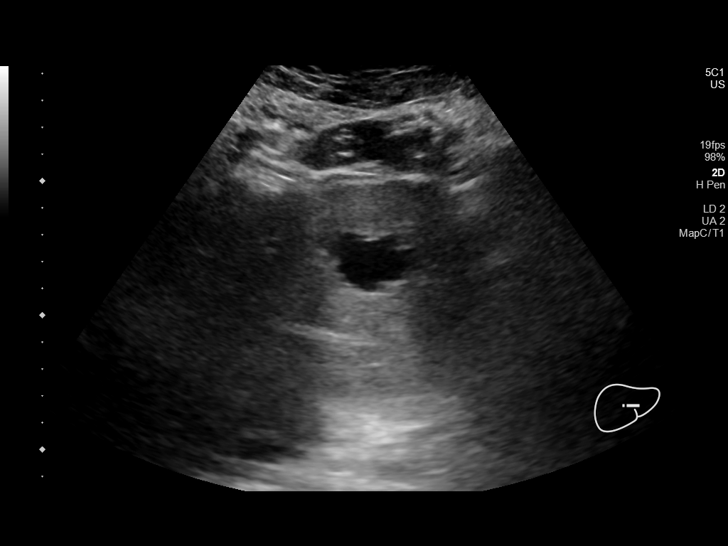
[im 46/85]
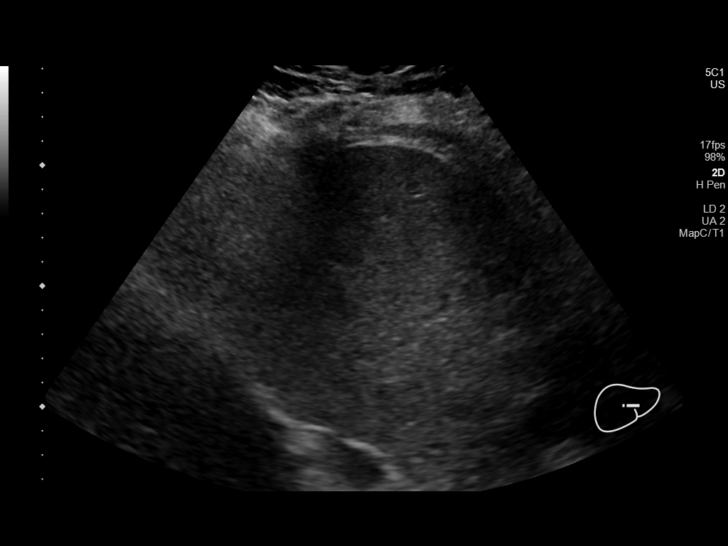
[im 53/85]
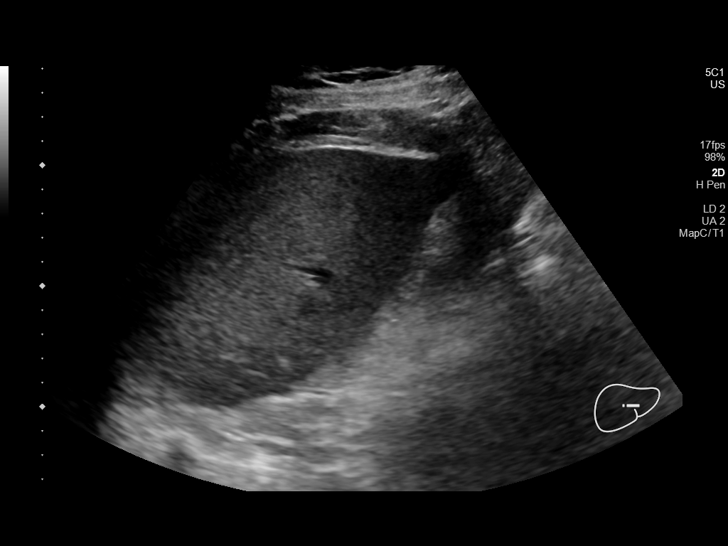
[im 57/85]
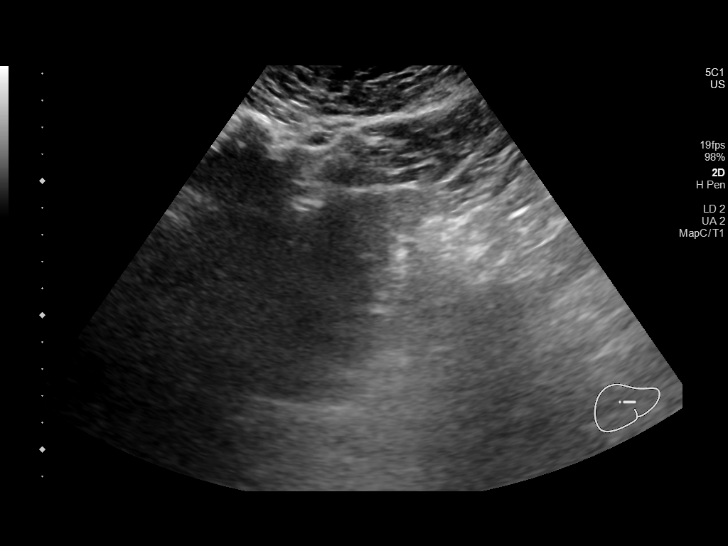
[im 64/85]
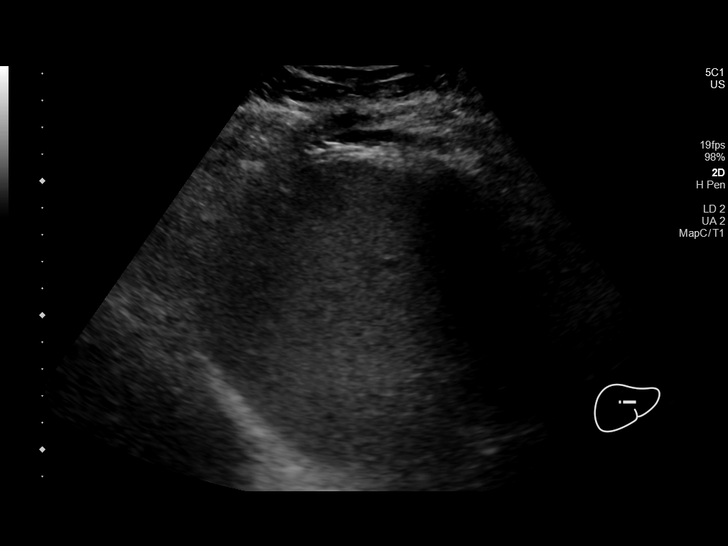
[im 71/85]
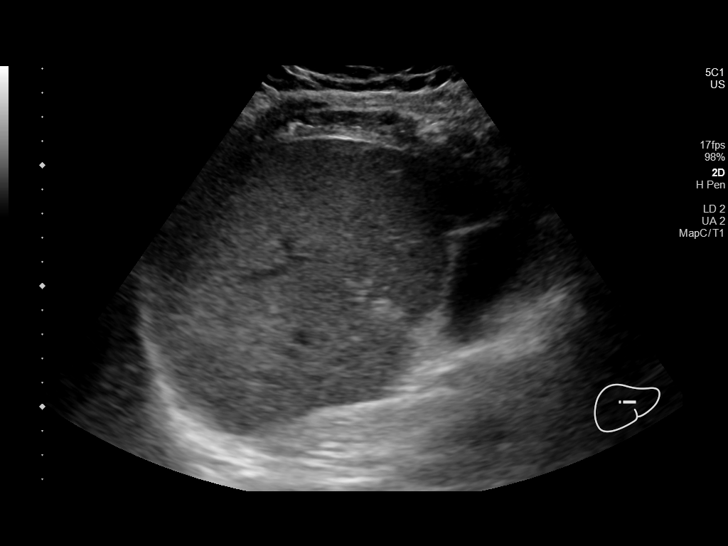
[im 78/85]
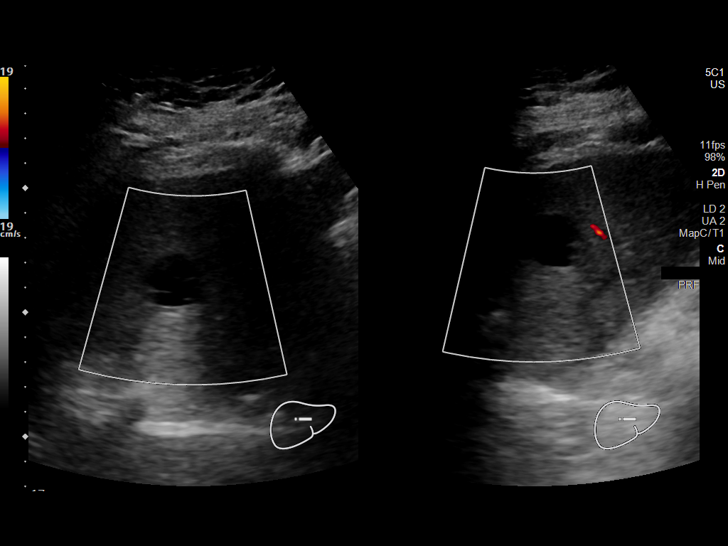
[im 85/85]
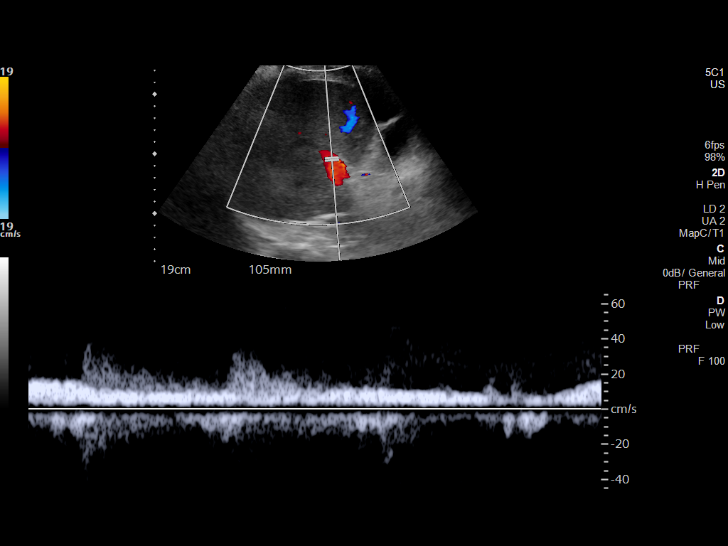

[14 of 25 positions shown; findings below may reference images not displayed]

FINDINGS: Gallbladder:

No gallstones or wall thickening visualized. No sonographic Murphy
sign noted by sonographer.

Common bile duct:

Diameter: 5 mm

Liver:

Scattered simple liver cysts, including at the gallbladder fossa, as
confirmed by CT. Maximal cyst diameter is 3.5 cm in the left lobe.
Portal vein is patent on color Doppler imaging with normal direction
of blood flow towards the liver.
IMPRESSION: 1. No acute finding.  Unremarkable gallbladder.
2. Scattered simple liver cysts.

## 2020-07-13 IMAGING — CT CT ABD-PELV W/O CM
2 of 4 series · 16 of 46 positions shown, 18 images · non-contrast
Comparison: [DATE]

CLINICAL DATA: Abdominal pain and fever.  Cancer patient.

EXAM:
CT ABDOMEN AND PELVIS WITHOUT CONTRAST
TECHNIQUE: Multidetector CT imaging of the abdomen and pelvis was performed
following the standard protocol without IV contrast.

[Series 2: axial st · axial · 0.85mm/px · z∈[+836,+1301]mm · 13 of 107 slices shown, 15 images]
[im 7/107  soft-tissue]
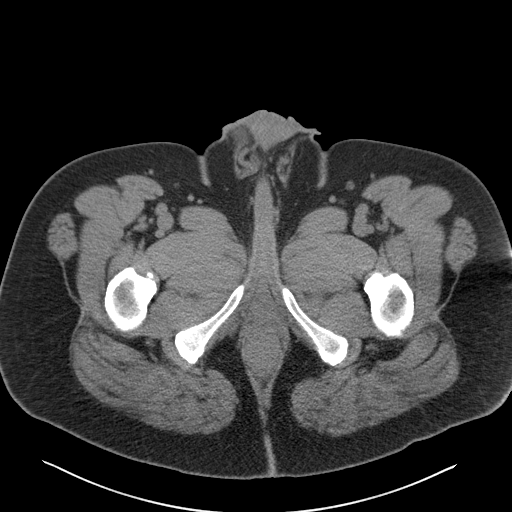
[im 7/107  bone]
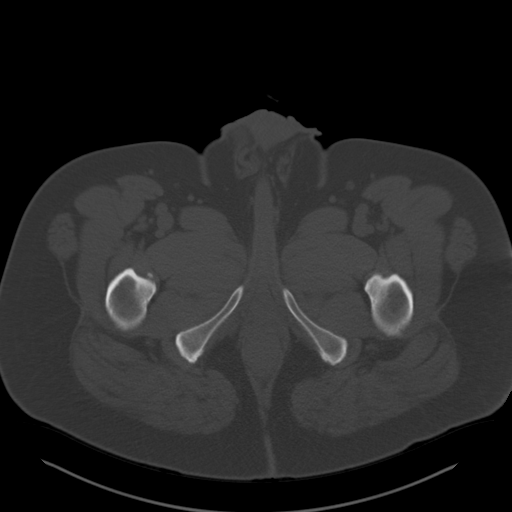
[im 14/107  soft-tissue]
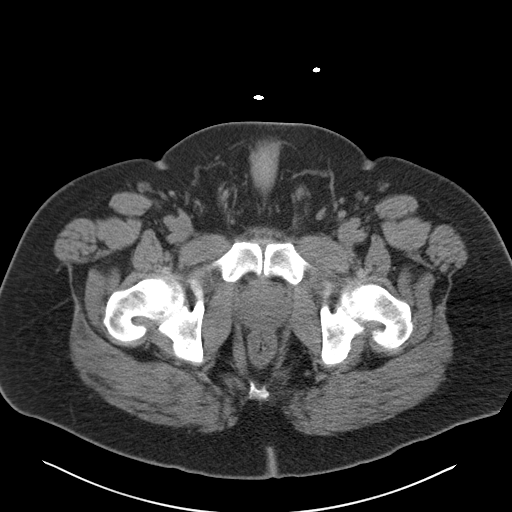
[im 20/107  soft-tissue]
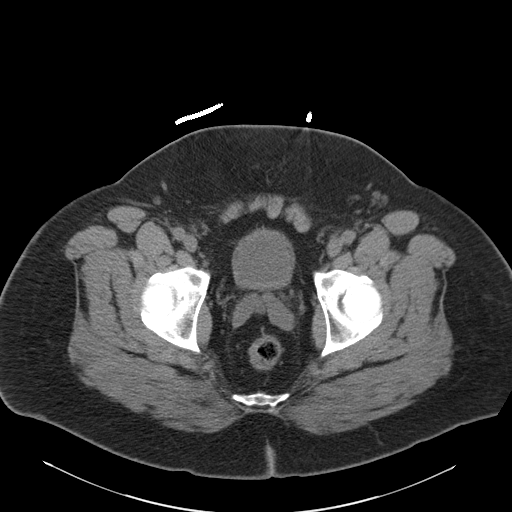
[im 34/107  soft-tissue]
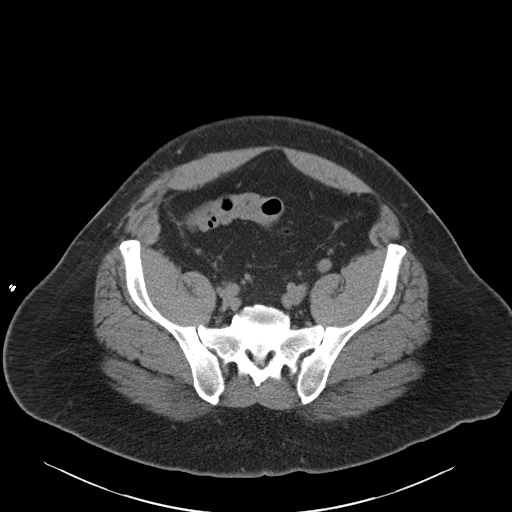
[im 40/107  soft-tissue]
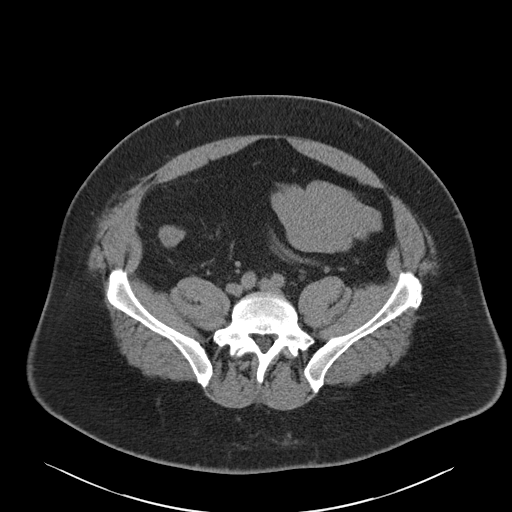
[im 47/107  soft-tissue]
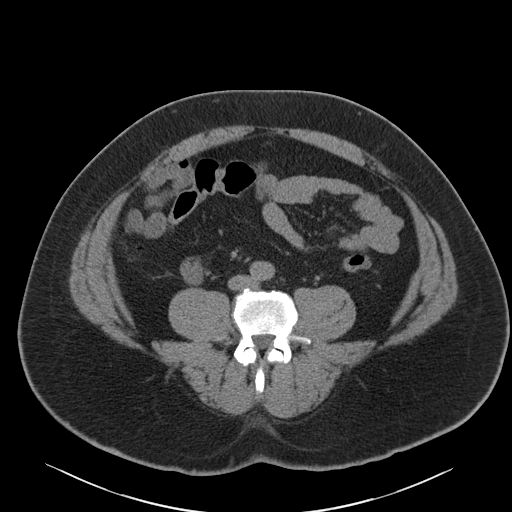
[im 54/107  soft-tissue]
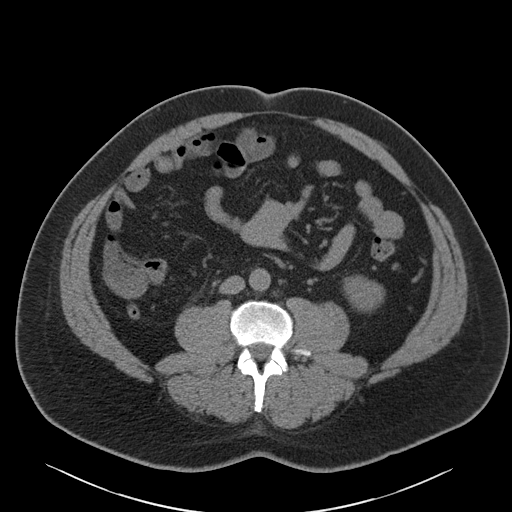
[im 60/107  soft-tissue]
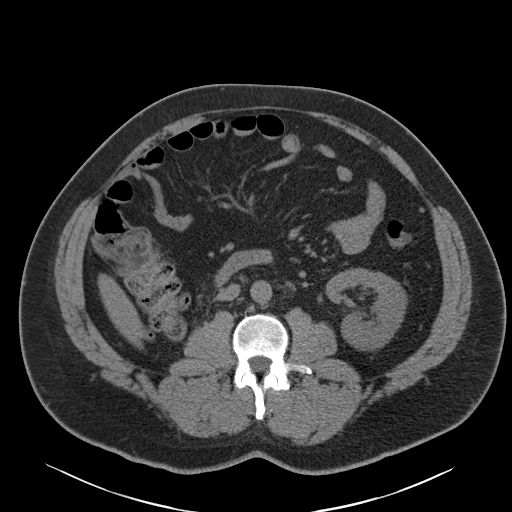
[im 67/107  soft-tissue]
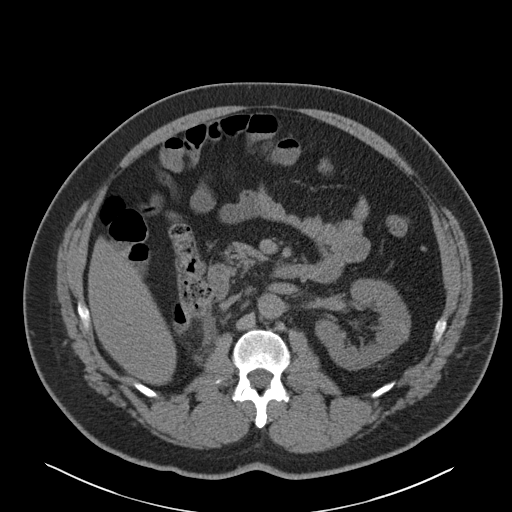
[im 67/107  bone]
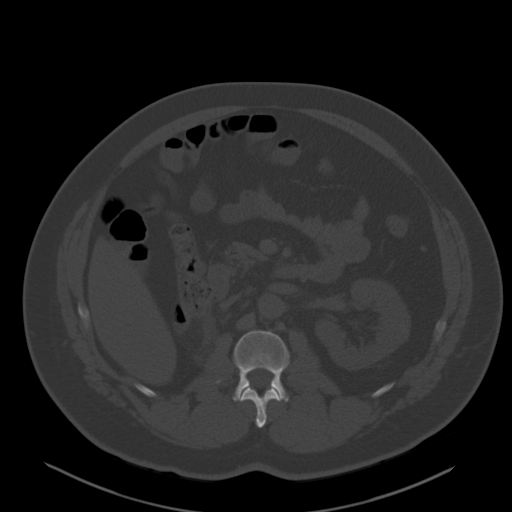
[im 73/107  soft-tissue]
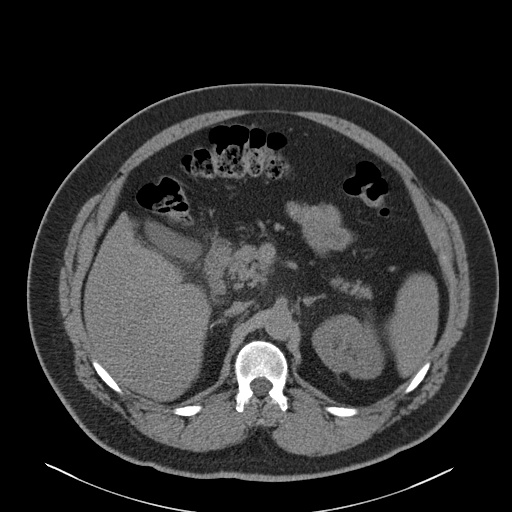
[im 87/107  soft-tissue]
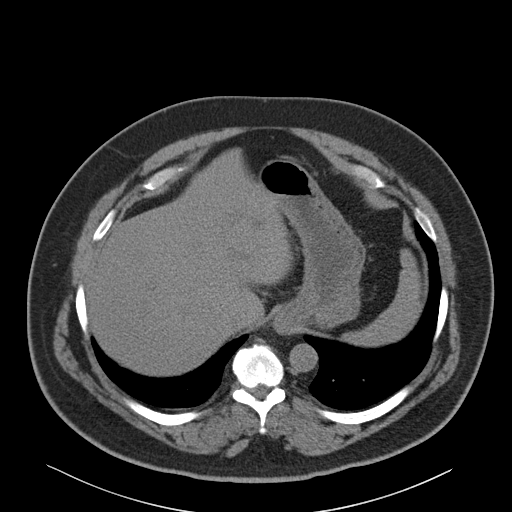
[im 93/107  soft-tissue]
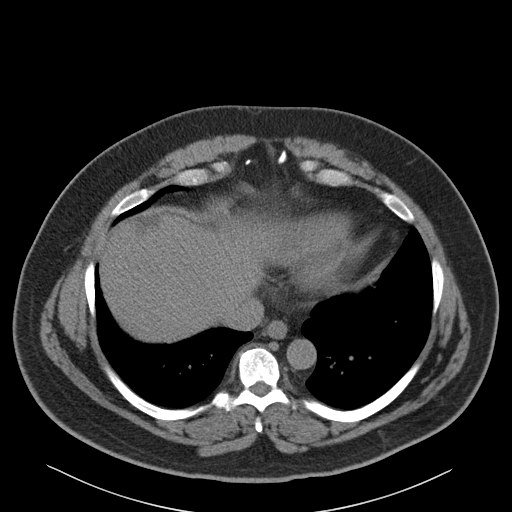
[im 100/107  soft-tissue]
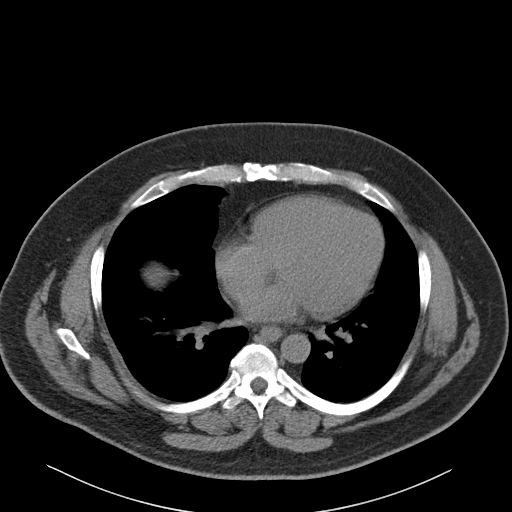

[Series 4: coronal st · coronal · 0.99mm/px · 3 of 173 slices shown]
[im 58/173  soft-tissue]
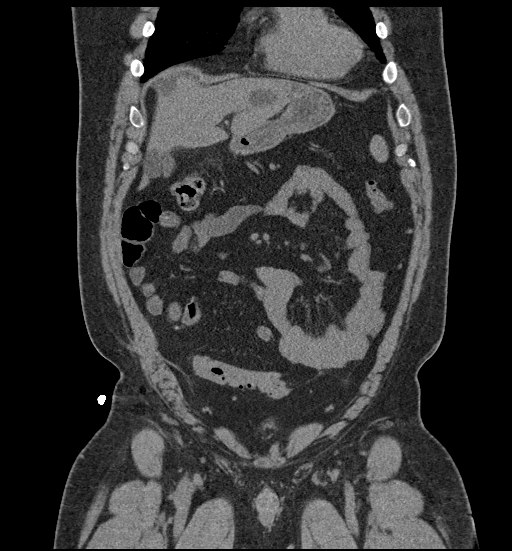
[im 77/173  soft-tissue]
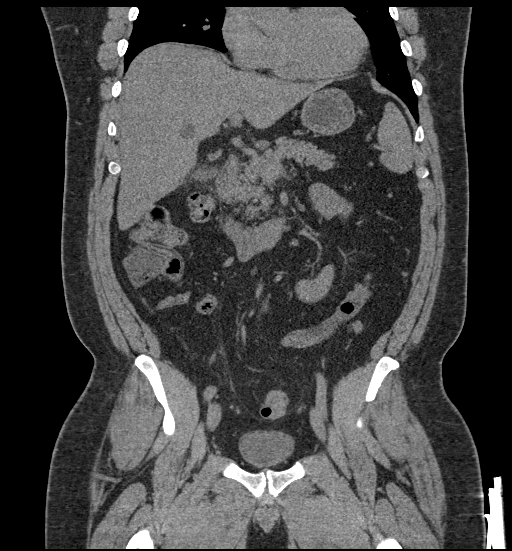
[im 96/173  soft-tissue]
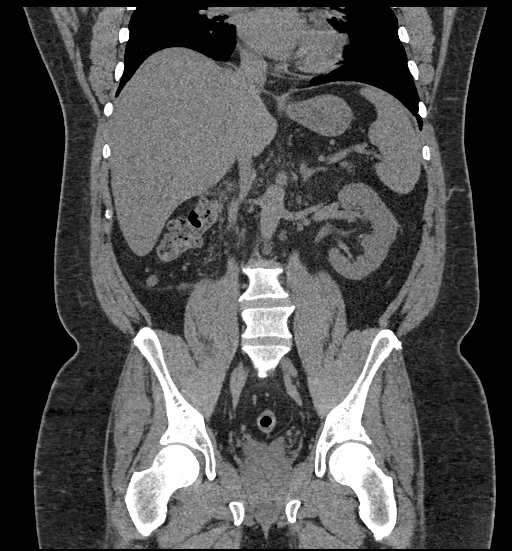

[16 of 46 positions shown; findings below may reference images not displayed]

FINDINGS: Lower chest: 4 mm nodule in the right lung base, unchanged since
prior study.

Hepatobiliary: Multiple hepatic cysts are unchanged. The gallbladder
is contracted. No bile duct dilatation.

Pancreas: Unremarkable. No pancreatic ductal dilatation or
surrounding inflammatory changes.

Spleen: Normal in size without focal abnormality.

Adrenals/Urinary Tract: No adrenal gland nodules. Surgical absence
of the right kidney. Left kidney is unremarkable. Bladder is
decompressed but appears normal.

Stomach/Bowel: The stomach, small bowel, and colon are mostly
decompressed. No wall thickening is demonstrated. Stool fills the
colon. The appendix is normal.

Vascular/Lymphatic: Normal caliber abdominal aorta and IVC.
Moderately prominent lymph nodes in the porta hepatis and right
pericolic gutter. They appear more prominent than on the prior
study, suggesting more likely reactive nodes. Scattered
retroperitoneal and mesenteric lymph nodes are not pathologically
enlarged.

Reproductive: Prostate is unremarkable.

Other: No free air or free fluid in the abdomen. There is
infiltration in the right upper quadrant fat involving the
periumbilical region, falciform ligament, and posterior perihepatic
spaces. This is new since previous study and suggests an
inflammatory process. The source may be the gallbladder or duodenum.
A vascular process causing edema is not entirely excluded.
Infiltration in the right inguinal region and extending along the
right flank musculature is also present but unchanged since previous
study. Consider ultrasound of the gallbladder for possibility of
cholecystitis.

Musculoskeletal: No destructive bone lesions.
IMPRESSION: 1. Infiltration in the right upper quadrant fat involving the
periumbilical veins, falciform ligament, and posterior perihepatic
spaces. This is new since previous study and suggests an
inflammatory process. The source may be the gallbladder or duodenum.
A vascular process causing edema is not entirely excluded. Consider
ultrasound of the gallbladder for possibility of cholecystitis.
2. Surgical absence of the right kidney.
3. Multiple hepatic cysts are unchanged.
4. 4 mm nodule in the right lung base is unchanged since prior
study.

## 2020-07-13 MED ORDER — LEVOFLOXACIN IN D5W 750 MG/150ML IV SOLN: 750.0000 mg | Freq: Once | INTRAVENOUS | Status: DC

## 2020-07-13 MED ORDER — SODIUM CHLORIDE 0.9 % IV BOLUS
500.0000 mL | Freq: Once | INTRAVENOUS | Status: AC
  Administered 2020-07-13: 500 mL via INTRAVENOUS

## 2020-07-13 MED ORDER — LACTATED RINGERS IV SOLN: INTRAVENOUS | Status: DC

## 2020-07-13 MED ORDER — SODIUM CHLORIDE 0.9 % IV SOLN
2.0000 g | Freq: Once | INTRAVENOUS | Status: AC
  Administered 2020-07-13: 2 g via INTRAVENOUS
  Filled 2020-07-13: qty 20

## 2020-07-13 MED ORDER — METRONIDAZOLE IN NACL 5-0.79 MG/ML-% IV SOLN
500.0000 mg | Freq: Once | INTRAVENOUS | Status: AC
  Administered 2020-07-13: 500 mg via INTRAVENOUS
  Filled 2020-07-13: qty 100

## 2020-07-13 NOTE — Discharge Instructions (Addendum)
Please follow up with your oncologist Dr Alen Blew today in the office for your scheduled visit.  You should skip your dose of axitinib today until you've talked to Dr Alen Blew.  Your oncologist will decide whether to continue this medication, and whether you may need antibiotics.  It is possible that your fever, fatigue, and liver enzyme abnormalities are side effects of your medications.

## 2020-07-13 NOTE — ED Provider Notes (Signed)
Clinical Course as of Jul 14 743  Thu Jul 13, 2020  0704 Patient signed out to me by Dr Laverta Baltimore.  Briefly this is a 54 yo male w/ renal cell carcinoma s/p nephrectomy, recently restarted on immunotherapy, presenting with fatigue and fevers x 1 day.  He has a transminitis here.  He's had similar issues in the past.  T Bili also 1.5.  CT scan showed haziness in RUQ.  Patient now pending RUQ Ultrasound. He has minimal tenderness on exam.  He has received IV rocephin and flagyl and 500 cc NS bolus.  Plan to f/u RUQ ultrasound results and touch base with oncology.  Patient has appointment today with Dr Alen Blew his oncologist.   [MT]  0725 IMPRESSION: 1. No acute finding. Unremarkable gallbladder. 2. Scattered simple liver cysts.    [MT]  [phone] I spoke to Dr Benay Spice from oncology who reviewed the patient's workup and feels it is reasonable to discharge the patient home with office follow up today with Dr Alen Blew.  Dr Benay Spice reports that transminitis and a low-grade fever could be expected side effects of axitinib, which was restarted on 07/05/2020. This information was relayed to the patient and his wife who agree with the plan.  The patient remains asymptomatic and states he feels "fine" on my assessment.  Dr Benay Spice did not recommend initiating antibiotics empirically at this time, but felt Dr Alen Blew could make this decision in the office.  Okay for discharge.   [MT]    Clinical Course User Index [MT] Avery Eustice, Carola Rhine, MD      Wyvonnia Dusky, MD 07/13/20 [phone]

## 2020-07-13 NOTE — ED Triage Notes (Signed)
Patient arrived stating that he is a cancer patient and was recently started back on medication. Reports since he has had similar symptoms as prior by feeling weak and having chills. Today developed a fever.

## 2020-07-13 NOTE — Progress Notes (Signed)
Hematology and Oncology Follow Up Visit  Michael [name] [id number] 03-01-1966 53 y.o. 07/13/2020 1:23 PM System, Pcp Not InNo ref. provider found   Principle Diagnosis: 54 year old man with stage IV clear-cell renal cell carcinoma documented in March 2021.  He was found to have hilar adenopathy and pulmonary nodules.  He was initially diagnosed with localized disease in 2020.   Prior Therapy: He underwent a right nephrectomy on August 27, 2019 performed while he was living in [place].  The final pathology showed clear cell renal cell carcinoma, nuclear grade 3 with 10% necrosis negative margins.  Final pathological stage was T2ANX.  He is status post Pembrolizumab and axitinib started in March 2021.  He received two cycles of therapy.  Therapy was interrupted because of increased LFTs and autoimmune hepatitis.    He has been receiving his therapy in [place] and relocated to this area.  Current therapy: Axitinib 5 mg daily started on July 05, 2020.  Interim History: Michael [name] is here for repeat evaluation.  Since the last visit, he has started axitinib in the last 2 weeks and was seen in the emergency department earlier this morning for complaints of fever.  He reported 10 days of axitinib before he started presenting with fatigue and a temperature of 102.  In the emergency department laboratory testing showed AST of 337 and ALT 533.  His bilirubin was 1.5.  CBC was normal and a creatinine of 1.6.  Imaging studies including CT scan of the abdomen and pelvis showed an infiltration of the right upper quadrant fat suggestive of an inflammatory process.  Ultrasound did not show any evidence of gallbladder disease.  Since his discharge from the emergency department earlier today, he reports feeling well without any complaints.  He denied any nausea, vomiting or abdominal pain.  He denied any fevers or chills.     Medications: Updated on review Current Outpatient Medications  Medication Sig  Dispense Refill  . amLODipine (NORVASC) 10 MG tablet Take 10 mg by mouth daily.    Marland Kitchen axitinib (INLYTA) 5 MG tablet Take 1 tablet (5 mg total) by mouth daily. (Patient not taking: Reported on 07/13/2020) 30 tablet 0  . cholecalciferol (VITAMIN D3) 25 MCG (1000 UNIT) tablet Take 2,000 Units by mouth daily.    Marland Kitchen levothyroxine (SYNTHROID) 75 MCG tablet Take 1 tablet (75 mcg total) by mouth every morning. 90 tablet 3  . montelukast (SINGULAIR) 10 MG tablet Take 10 mg by mouth at bedtime.    . predniSONE (DELTASONE) 5 MG tablet Take 1 tablet (5 mg total) by mouth daily with breakfast. (Patient not taking: Reported on 07/13/2020) 7 tablet 0  . tadalafil (CIALIS) 20 MG tablet Take 20 mg by mouth daily as needed for erectile dysfunction.    Marland Kitchen zinc gluconate 50 MG tablet Take 50 mg by mouth daily.     No current facility-administered medications for this visit.     Allergies:  Allergies  Allergen Reactions  . Penicillins Other (See Comments)    Childhood allergies- unk      Physical Exam: Blood pressure 104/76, pulse 80, temperature 98.9 F (37.2 C), temperature source Tympanic, resp. rate 18, height 5\' 6"  (1.676 m), weight 255 lb 14.4 oz (116.1 kg), SpO2 98 %.    ECOG: 0    General appearance: Alert, awake without any distress. Head: Atraumatic without abnormalities Oropharynx: Without any thrush or ulcers. Eyes: No scleral icterus. Lymph nodes: No lymphadenopathy noted in the cervical, supraclavicular, or axillary nodes Heart:regular rate and  rhythm, without any murmurs or gallops.   Lung: Clear to auscultation without any rhonchi, wheezes or dullness to percussion. Abdomin: Soft, nontender without any shifting dullness or ascites. Musculoskeletal: No clubbing or cyanosis. Neurological: No motor or sensory deficits. Skin: No rashes or lesions.     Lab Results: Lab Results  Component Value Date   WBC 9.2 07/13/2020   HGB 14.6 07/13/2020   HCT 42.4 07/13/2020   MCV 89.6  07/13/2020   PLT 184 07/13/2020     Chemistry      Component Value Date/Time   NA 133 (L) 07/13/2020 0115   K 3.6 07/13/2020 0115   CL 101 07/13/2020 0115   CO2 23 07/13/2020 0115   BUN 17 07/13/2020 0115   CREATININE 1.61 (H) 07/13/2020 0115   CREATININE 1.49 (H) 06/12/2020 0835      Component Value Date/Time   CALCIUM 8.9 07/13/2020 0115   ALKPHOS 75 07/13/2020 0115   AST 337 (H) 07/13/2020 0115   AST 26 06/12/2020 0835   ALT 533 (H) 07/13/2020 0115   ALT 30 06/12/2020 0835   BILITOT 1.5 (H) 07/13/2020 0115   BILITOT 0.8 06/12/2020 0835       Radiological Studies: CT ABDOMEN PELVIS WO CONTRAST  Result Date: 07/13/2020 CLINICAL DATA:  Abdominal pain and fever.  Cancer patient. EXAM: CT ABDOMEN AND PELVIS WITHOUT CONTRAST TECHNIQUE: Multidetector CT imaging of the abdomen and pelvis was performed following the standard protocol without IV contrast. COMPARISON:  06/13/2020 FINDINGS: Lower chest: 4 mm nodule in the right lung base, unchanged since prior study. Hepatobiliary: Multiple hepatic cysts are unchanged. The gallbladder is contracted. No bile duct dilatation. Pancreas: Unremarkable. No pancreatic ductal dilatation or surrounding inflammatory changes. Spleen: Normal in size without focal abnormality. Adrenals/Urinary Tract: No adrenal gland nodules. Surgical absence of the right kidney. Left kidney is unremarkable. Bladder is decompressed but appears normal. Stomach/Bowel: The stomach, small bowel, and colon are mostly decompressed. No wall thickening is demonstrated. Stool fills the colon. The appendix is normal. Vascular/Lymphatic: Normal caliber abdominal aorta and IVC. Moderately prominent lymph nodes in the porta hepatis and right pericolic gutter. They appear more prominent than on the prior study, suggesting more likely reactive nodes. Scattered retroperitoneal and mesenteric lymph nodes are not pathologically enlarged. Reproductive: Prostate is unremarkable. Other: No free  air or free fluid in the abdomen. There is infiltration in the right upper quadrant fat involving the periumbilical region, falciform ligament, and posterior perihepatic spaces. This is new since previous study and suggests an inflammatory process. The source may be the gallbladder or duodenum. A vascular process causing edema is not entirely excluded. Infiltration in the right inguinal region and extending along the right flank musculature is also present but unchanged since previous study. Consider ultrasound of the gallbladder for possibility of cholecystitis. Musculoskeletal: No destructive bone lesions. IMPRESSION: 1. Infiltration in the right upper quadrant fat involving the periumbilical veins, falciform ligament, and posterior perihepatic spaces. This is new since previous study and suggests an inflammatory process. The source may be the gallbladder or duodenum. A vascular process causing edema is not entirely excluded. Consider ultrasound of the gallbladder for possibility of cholecystitis. 2. Surgical absence of the right kidney. 3. Multiple hepatic cysts are unchanged. 4. 4 mm nodule in the right lung base is unchanged since prior study. Electronically Signed   By: Lucienne Capers M.D.   On: 07/13/2020 04:17   DG Chest Portable 1 View  Result Date: 07/13/2020 CLINICAL DATA:  Fever beginning earlier  today. Renal cancer patient. EXAM: PORTABLE CHEST 1 VIEW COMPARISON:  None. FINDINGS: Shallow inspiration. Heart size is normal for technique. No vascular congestion, edema, or consolidation. No pleural effusions. No pneumothorax. Mediastinal contours appear intact. IMPRESSION: No active disease. Electronically Signed   By: Lucienne Capers M.D.   On: 07/13/2020 01:14   US Abdomen Limited RUQ  Result Date: 07/13/2020 CLINICAL DATA:  Right upper quadrant pain EXAM: ULTRASOUND ABDOMEN LIMITED RIGHT UPPER QUADRANT COMPARISON:  Abdominal CT from earlier today FINDINGS: Gallbladder: No gallstones or wall  thickening visualized. No sonographic Murphy sign noted by sonographer. Common bile duct: Diameter: 5 mm Liver: Scattered simple liver cysts, including at the gallbladder fossa, as confirmed by CT. Maximal cyst diameter is 3.5 cm in the left lobe. Portal vein is patent on color Doppler imaging with normal direction of blood flow towards the liver. IMPRESSION: 1. No acute finding.  Unremarkable gallbladder. 2. Scattered simple liver cysts. Electronically Signed   By: Monte Fantasia M.D.   On: 07/13/2020 07:12     Impression and Plan:  53 year old with:  1.    Stage IV clear-cell renal cell carcinoma documented in February 2021.  He was found to have right kidney tumor that is localized in October 2020.    He is status post therapy outlined above and the recently restarted on axitinib with reduced dose of 5 mg daily.  After 10 days of therapy his LFTs are abnormal with inflammatory process in the abdomen indicating likely drug related reaction.  Treatment options moving forward were reviewed.  I recommended discontinuation of axitinib at this time given these second incident of hepatotoxicity associated with this medication.  Alternative treatment options including cabozantinib, nivolumab or combination of the above.  I will not start any active cancer treatment till his liver function test normalizes.   2.  Hypothyroidism: He is currently on Synthroid and will continue to monitor his TSH.  3.  Hypertension:  Blood pressure is within normal range at this time.  4.  Goals of care and prognosis: Therapy remains palliative although aggressive are warranted given his excellent performance status.  5.  Elevated AST and ALT: Likely related to axitinib and I will discontinue this medication for the time being.  We will monitor closely to ensure recovery.  Referral to gastroenterology will be considered if no improvement noted in the next few weeks.  6.  Follow-up:  follow-up next week for repeat  laboratory testing and MD follow-up in 3 weeks.  30  minutes were dedicated to this visit.  The time was spent on reviewing laboratory data, imaging studies, treatment options and future plan of care review.     Zola Button, MD 9/9/20211:23 PM

## 2020-07-13 NOTE — Progress Notes (Signed)
Pharmacy Note   A consult was received from an ED physician for levofloxacin per pharmacy dosing.    The consults also states "Pharmacist to investigate beta-lactam allergy. If history of intolerance, mild allergy, or documented history of use of cephalosporins, pharmacy can adjust levofloxacin to ceftriaxone. "  Pt's allergy to PCN was an unknown childhood allergy.  Therefore, levofloxacin has been discontinued and a one time order has been placed for ceftriaxone 2 gr IV x1 .    Further antibiotics/pharmacy consults should be ordered by admitting physician if indicated.                       Thank you,  Royetta Asal, PharmD, BCPS 07/13/2020 4:53 AM

## 2020-07-13 NOTE — ED Provider Notes (Signed)
Emergency Department Provider Note   I have reviewed the triage vital signs and the nursing notes.   HISTORY  Chief Complaint Fever   HPI Michael [name] is a 54 y.o. male with past medical history of renal cell carcinoma recently back on immunotherapy followed by Dr. Alen Blew presents to the emergency department with fever.  Patient states that he restarted his immunotherapy 10 days ago and has developed some fatigue and feeling very cold which is to be expected.  He is experiencing symptoms in the past with this therapy.  In the past 24 hours he has developed fever at home with T-max of 102 F.  He called the nurse hotline for his oncology office and was referred to the emergency department for evaluation.  He has not taken anything for fever as he is not supposed to take NSAIDs given his renal issue and had elevated LFTs with his last round of immunotherapy so was advised against Tylenol as well.  Other than fatigue and feeling cold all the time he has no other symptoms to point to an infection source.  He has been vaccinated for COVID-19.  He is now experiencing chest pain, cough, shortness of breath.  No sore throat.  No diarrhea or vomiting.  No abdominal pain.  No dysuria, hesitancy, urgency.   Past Medical History:  Diagnosis Date  . Cancer ([place])   . Renal disorder     There are no problems to display for this patient.   Allergies Penicillins  No family history on file.  Social History Social History   Tobacco Use  . Smoking status: Not on file  Substance Use Topics  . Alcohol use: Not on file  . Drug use: Not on file    Review of Systems  Constitutional: Positive fever/chills Eyes: No visual changes. ENT: No sore throat. Cardiovascular: Denies chest pain. Respiratory: Denies shortness of breath. Gastrointestinal: No abdominal pain.  No nausea, no vomiting.  No diarrhea.  No constipation. Genitourinary: Negative for dysuria. Musculoskeletal: Negative for back  pain. Skin: Negative for rash. Neurological: Negative for headaches, focal weakness or numbness.  10-point ROS otherwise negative.  ____________________________________________   PHYSICAL EXAM:  VITAL SIGNS: ED Triage Vitals  Enc Vitals Group     BP 07/13/20 0031 (!) 148/89     Pulse Rate 07/13/20 0031 98     Resp 07/13/20 0031 16     Temp 07/13/20 0031 (!) 101.1 F (38.4 C)     Temp Source 07/13/20 0031 Oral     SpO2 07/13/20 0031 96 %   Constitutional: Alert and oriented. Well appearing and in no acute distress. Eyes: Conjunctivae are normal.  Head: Atraumatic. Nose: No congestion/rhinnorhea. Mouth/Throat: Mucous membranes are moist.  Neck: No stridor.  Cardiovascular: Normal rate, regular rhythm. Good peripheral circulation. Grossly normal heart sounds.   Respiratory: Normal respiratory effort.  No retractions. Lungs CTAB. Gastrointestinal: Soft and nontender. No distention.  Musculoskeletal: No lower extremity tenderness nor edema. No gross deformities of extremities. Neurologic:  Normal speech and language. No gross focal neurologic deficits are appreciated.  Skin:  Skin is warm, dry and intact. No rash noted.  ____________________________________________   LABS (all labs ordered are listed, but only abnormal results are displayed)  Labs Reviewed  COMPREHENSIVE METABOLIC PANEL - Abnormal; Notable for the following components:      Result Value   Sodium 133 (*)    Glucose, Bld 119 (*)    Creatinine, Ser 1.61 (*)    AST 337 (*)  ALT 533 (*)    Total Bilirubin 1.5 (*)    GFR calc non Af Amer 48 (*)    GFR calc Af Amer 56 (*)    All other components within normal limits  CBC WITH DIFFERENTIAL/PLATELET - Abnormal; Notable for the following components:   Monocytes Absolute 1.1 (*)    All other components within normal limits  URINALYSIS, ROUTINE W REFLEX MICROSCOPIC - Abnormal; Notable for the following components:   Hgb urine dipstick MODERATE (*)     Protein, ur 30 (*)    All other components within normal limits  SARS CORONAVIRUS 2 BY RT PCR (HOSPITAL ORDER, [place] [facility])  CULTURE, BLOOD (ROUTINE X 2)  CULTURE, BLOOD (ROUTINE X 2)  URINE CULTURE  LACTIC ACID, PLASMA  LACTIC ACID, PLASMA   ____________________________________________  RADIOLOGY  CT ABDOMEN PELVIS WO CONTRAST  Result Date: 07/13/2020 CLINICAL DATA:  Abdominal pain and fever.  Cancer patient. EXAM: CT ABDOMEN AND PELVIS WITHOUT CONTRAST TECHNIQUE: Multidetector CT imaging of the abdomen and pelvis was performed following the standard protocol without IV contrast. COMPARISON:  06/13/2020 FINDINGS: Lower chest: 4 mm nodule in the right lung base, unchanged since prior study. Hepatobiliary: Multiple hepatic cysts are unchanged. The gallbladder is contracted. No bile duct dilatation. Pancreas: Unremarkable. No pancreatic ductal dilatation or surrounding inflammatory changes. Spleen: Normal in size without focal abnormality. Adrenals/Urinary Tract: No adrenal gland nodules. Surgical absence of the right kidney. Left kidney is unremarkable. Bladder is decompressed but appears normal. Stomach/Bowel: The stomach, small bowel, and colon are mostly decompressed. No wall thickening is demonstrated. Stool fills the colon. The appendix is normal. Vascular/Lymphatic: Normal caliber abdominal aorta and IVC. Moderately prominent lymph nodes in the porta hepatis and right pericolic gutter. They appear more prominent than on the prior study, suggesting more likely reactive nodes. Scattered retroperitoneal and mesenteric lymph nodes are not pathologically enlarged. Reproductive: Prostate is unremarkable. Other: No free air or free fluid in the abdomen. There is infiltration in the right upper quadrant fat involving the periumbilical region, falciform ligament, and posterior perihepatic spaces. This is new since previous study and suggests an inflammatory process. The source may  be the gallbladder or duodenum. A vascular process causing edema is not entirely excluded. Infiltration in the right inguinal region and extending along the right flank musculature is also present but unchanged since previous study. Consider ultrasound of the gallbladder for possibility of cholecystitis. Musculoskeletal: No destructive bone lesions. IMPRESSION: 1. Infiltration in the right upper quadrant fat involving the periumbilical veins, falciform ligament, and posterior perihepatic spaces. This is new since previous study and suggests an inflammatory process. The source may be the gallbladder or duodenum. A vascular process causing edema is not entirely excluded. Consider ultrasound of the gallbladder for possibility of cholecystitis. 2. Surgical absence of the right kidney. 3. Multiple hepatic cysts are unchanged. 4. 4 mm nodule in the right lung base is unchanged since prior study. Electronically Signed   By: Lucienne Capers M.D.   On: 07/13/2020 04:17   DG Chest Portable 1 View  Result Date: 07/13/2020 CLINICAL DATA:  Fever beginning earlier today. Renal cancer patient. EXAM: PORTABLE CHEST 1 VIEW COMPARISON:  None. FINDINGS: Shallow inspiration. Heart size is normal for technique. No vascular congestion, edema, or consolidation. No pleural effusions. No pneumothorax. Mediastinal contours appear intact. IMPRESSION: No active disease. Electronically Signed   By: Lucienne Capers M.D.   On: 07/13/2020 01:14    ____________________________________________   PROCEDURES  Procedure(s)  performed:   Procedures  None ____________________________________________   INITIAL IMPRESSION / ASSESSMENT AND PLAN / ED COURSE  Pertinent labs & imaging results that were available during my care of the patient were reviewed by me and considered in my medical decision making (see chart for details).   Patient presents to the emergency department with fever in the setting of starting his PO immunotherapy.   He is very well-appearing.  Despite his fever his vital signs are otherwise unremarkable.  No clear source of infection by history or exam.  Plan for labs including lactate, blood cultures, UA with culture, CXR, and COVID test.   04:50 AM  Labs with elevated LFTs and mild elevation in bilirubin. With fever CT abdomen/pelvis obtained with no [place] available at this hour. RUQ with inflammation but no duct dilation. Question cholangitis vs acute cholecystitis vs medication side effect. [place] arrives in the next hour. Will cover with abx and follow RUQ [place]. COVID negative. Normal lactate and no leukocytosis. Doubt sepsis clinically.   Care transferred to Dr. Langston Masker pending RUQ [place].  ____________________________________________  FINAL CLINICAL IMPRESSION(S) / ED DIAGNOSES  Final diagnoses:  RUQ abdominal pain     MEDICATIONS GIVEN DURING THIS VISIT:  Medications  lactated ringers infusion (0 mLs Intravenous Hold 07/13/20 0456)  metroNIDAZOLE (FLAGYL) IVPB 500 mg (500 mg Intravenous New Bag/Given 07/13/20 0456)  cefTRIAXone (ROCEPHIN) 2 g in sodium chloride 0.9 % 100 mL IVPB (has no administration in time range)  sodium chloride 0.9 % bolus 500 mL (0 mLs Intravenous Stopped 07/13/20 0226)    Note:  This document was prepared using Dragon voice recognition software and may include unintentional dictation errors.  Nanda Quinton, MD, [facility] Emergency Medicine    Reinhard Schack, [facility], MD 07/16/20 [id number]

## 2020-07-14 ENCOUNTER — Telehealth: Payer: Self-pay | Admitting: Oncology

## 2020-07-14 DIAGNOSIS — E039 Hypothyroidism, unspecified: Secondary | ICD-10-CM | POA: Diagnosis not present

## 2020-07-14 DIAGNOSIS — I10 Essential (primary) hypertension: Secondary | ICD-10-CM | POA: Diagnosis not present

## 2020-07-14 DIAGNOSIS — C649 Malignant neoplasm of unspecified kidney, except renal pelvis: Secondary | ICD-10-CM | POA: Diagnosis not present

## 2020-07-14 DIAGNOSIS — Z7189 Other specified counseling: Secondary | ICD-10-CM | POA: Diagnosis not present

## 2020-07-14 LAB — URINE CULTURE: Culture: NO GROWTH

## 2020-07-14 NOTE — Telephone Encounter (Signed)
Scheduled appointments per 9/9 los. Patient is aware of all appointments.

## 2020-07-18 LAB — CULTURE, BLOOD (ROUTINE X 2)
Culture: NO GROWTH
Culture: NO GROWTH

## 2020-07-20 ENCOUNTER — Other Ambulatory Visit: Payer: BC Managed Care – PPO

## 2020-07-24 ENCOUNTER — Other Ambulatory Visit: Payer: Self-pay

## 2020-07-24 ENCOUNTER — Inpatient Hospital Stay: Payer: BC Managed Care – PPO

## 2020-07-24 ENCOUNTER — Telehealth: Payer: Self-pay | Admitting: *Deleted

## 2020-07-24 DIAGNOSIS — Z905 Acquired absence of kidney: Secondary | ICD-10-CM | POA: Diagnosis not present

## 2020-07-24 DIAGNOSIS — E039 Hypothyroidism, unspecified: Secondary | ICD-10-CM | POA: Diagnosis not present

## 2020-07-24 DIAGNOSIS — C649 Malignant neoplasm of unspecified kidney, except renal pelvis: Secondary | ICD-10-CM

## 2020-07-24 DIAGNOSIS — I10 Essential (primary) hypertension: Secondary | ICD-10-CM | POA: Diagnosis not present

## 2020-07-24 DIAGNOSIS — K754 Autoimmune hepatitis: Secondary | ICD-10-CM | POA: Diagnosis not present

## 2020-07-24 DIAGNOSIS — R7989 Other specified abnormal findings of blood chemistry: Secondary | ICD-10-CM | POA: Diagnosis not present

## 2020-07-24 DIAGNOSIS — C641 Malignant neoplasm of right kidney, except renal pelvis: Secondary | ICD-10-CM | POA: Diagnosis not present

## 2020-07-24 DIAGNOSIS — R509 Fever, unspecified: Secondary | ICD-10-CM | POA: Diagnosis not present

## 2020-07-24 DIAGNOSIS — K7689 Other specified diseases of liver: Secondary | ICD-10-CM | POA: Diagnosis not present

## 2020-07-24 DIAGNOSIS — Z7952 Long term (current) use of systemic steroids: Secondary | ICD-10-CM | POA: Diagnosis not present

## 2020-07-24 DIAGNOSIS — Z880 Allergy status to penicillin: Secondary | ICD-10-CM | POA: Diagnosis not present

## 2020-07-24 DIAGNOSIS — R109 Unspecified abdominal pain: Secondary | ICD-10-CM | POA: Diagnosis not present

## 2020-07-24 DIAGNOSIS — Z79899 Other long term (current) drug therapy: Secondary | ICD-10-CM | POA: Diagnosis not present

## 2020-07-24 DIAGNOSIS — R5383 Other fatigue: Secondary | ICD-10-CM | POA: Diagnosis not present

## 2020-07-24 DIAGNOSIS — R7401 Elevation of levels of liver transaminase levels: Secondary | ICD-10-CM | POA: Diagnosis not present

## 2020-07-24 LAB — CBC WITH DIFFERENTIAL (CANCER CENTER ONLY)
Abs Immature Granulocytes: 0.01 10*3/uL (ref 0.00–0.07)
Basophils Absolute: 0.1 10*3/uL (ref 0.0–0.1)
Basophils Relative: 1 %
Eosinophils Absolute: 0.3 10*3/uL (ref 0.0–0.5)
Eosinophils Relative: 5 %
HCT: 40.1 % (ref 39.0–52.0)
Hemoglobin: 13.4 g/dL (ref 13.0–17.0)
Immature Granulocytes: 0 %
Lymphocytes Relative: 37 %
Lymphs Abs: 2.4 10*3/uL (ref 0.7–4.0)
MCH: 30.7 pg (ref 26.0–34.0)
MCHC: 33.4 g/dL (ref 30.0–36.0)
MCV: 92 fL (ref 80.0–100.0)
Monocytes Absolute: 0.7 10*3/uL (ref 0.1–1.0)
Monocytes Relative: 10 %
Neutro Abs: 3 10*3/uL (ref 1.7–7.7)
Neutrophils Relative %: 47 %
Platelet Count: 284 10*3/uL (ref 150–400)
RBC: 4.36 MIL/uL (ref 4.22–5.81)
RDW: 13.1 % (ref 11.5–15.5)
WBC Count: 6.5 10*3/uL (ref 4.0–10.5)
nRBC: 0 % (ref 0.0–0.2)

## 2020-07-24 LAB — CMP (CANCER CENTER ONLY)
ALT: 339 U/L (ref 0–44)
AST: 227 U/L (ref 15–41)
Albumin: 3.8 g/dL (ref 3.5–5.0)
Alkaline Phosphatase: 97 U/L (ref 38–126)
Anion gap: 8 (ref 5–15)
BUN: 15 mg/dL (ref 6–20)
CO2: 26 mmol/L (ref 22–32)
Calcium: 8.9 mg/dL (ref 8.9–10.3)
Chloride: 105 mmol/L (ref 98–111)
Creatinine: 1.45 mg/dL — ABNORMAL HIGH (ref 0.61–1.24)
GFR, Est AFR Am: >60 mL/min (ref >60)
GFR, Estimated: 55 mL/min — ABNORMAL LOW (ref >60)
Glucose, Bld: 97 mg/dL (ref 70–99)
Potassium: 4.9 mmol/L (ref 3.5–5.1)
Sodium: 139 mmol/L (ref 135–145)
Total Bilirubin: 0.8 mg/dL (ref 0.3–1.2)
Total Protein: 7.3 g/dL (ref 6.5–8.1)

## 2020-07-24 LAB — TSH: TSH: 71.688 u[IU]/mL — ABNORMAL HIGH (ref 0.320–4.118)

## 2020-07-24 NOTE — Telephone Encounter (Signed)
Received call from [facility] in the lab with results-AST 227, ALT 339  Pt's LFTs still elevated but coming down.  Dr. Alen Blew notified.

## 2020-08-01 ENCOUNTER — Telehealth: Payer: Self-pay

## 2020-08-01 ENCOUNTER — Inpatient Hospital Stay: Payer: BC Managed Care – PPO

## 2020-08-01 ENCOUNTER — Other Ambulatory Visit: Payer: Self-pay

## 2020-08-01 DIAGNOSIS — I10 Essential (primary) hypertension: Secondary | ICD-10-CM | POA: Diagnosis not present

## 2020-08-01 DIAGNOSIS — R109 Unspecified abdominal pain: Secondary | ICD-10-CM | POA: Diagnosis not present

## 2020-08-01 DIAGNOSIS — E039 Hypothyroidism, unspecified: Secondary | ICD-10-CM | POA: Diagnosis not present

## 2020-08-01 DIAGNOSIS — Z880 Allergy status to penicillin: Secondary | ICD-10-CM | POA: Diagnosis not present

## 2020-08-01 DIAGNOSIS — R5383 Other fatigue: Secondary | ICD-10-CM | POA: Diagnosis not present

## 2020-08-01 DIAGNOSIS — R7989 Other specified abnormal findings of blood chemistry: Secondary | ICD-10-CM | POA: Diagnosis not present

## 2020-08-01 DIAGNOSIS — Z79899 Other long term (current) drug therapy: Secondary | ICD-10-CM | POA: Diagnosis not present

## 2020-08-01 DIAGNOSIS — K7689 Other specified diseases of liver: Secondary | ICD-10-CM | POA: Diagnosis not present

## 2020-08-01 DIAGNOSIS — Z905 Acquired absence of kidney: Secondary | ICD-10-CM | POA: Diagnosis not present

## 2020-08-01 DIAGNOSIS — Z7952 Long term (current) use of systemic steroids: Secondary | ICD-10-CM | POA: Diagnosis not present

## 2020-08-01 DIAGNOSIS — R7401 Elevation of levels of liver transaminase levels: Secondary | ICD-10-CM | POA: Diagnosis not present

## 2020-08-01 DIAGNOSIS — C649 Malignant neoplasm of unspecified kidney, except renal pelvis: Secondary | ICD-10-CM

## 2020-08-01 DIAGNOSIS — R509 Fever, unspecified: Secondary | ICD-10-CM | POA: Diagnosis not present

## 2020-08-01 DIAGNOSIS — K754 Autoimmune hepatitis: Secondary | ICD-10-CM | POA: Diagnosis not present

## 2020-08-01 DIAGNOSIS — C641 Malignant neoplasm of right kidney, except renal pelvis: Secondary | ICD-10-CM | POA: Diagnosis not present

## 2020-08-01 LAB — CBC WITH DIFFERENTIAL (CANCER CENTER ONLY)
Abs Immature Granulocytes: 0.02 10*3/uL (ref 0.00–0.07)
Basophils Absolute: 0 10*3/uL (ref 0.0–0.1)
Basophils Relative: 0 %
Eosinophils Absolute: 0.2 10*3/uL (ref 0.0–0.5)
Eosinophils Relative: 3 %
HCT: 45.5 % (ref 39.0–52.0)
Hemoglobin: 15 g/dL (ref 13.0–17.0)
Immature Granulocytes: 0 %
Lymphocytes Relative: 32 %
Lymphs Abs: 2.3 10*3/uL (ref 0.7–4.0)
MCH: 30.3 pg (ref 26.0–34.0)
MCHC: 33 g/dL (ref 30.0–36.0)
MCV: 91.9 fL (ref 80.0–100.0)
Monocytes Absolute: 0.6 10*3/uL (ref 0.1–1.0)
Monocytes Relative: 8 %
Neutro Abs: 4.1 10*3/uL (ref 1.7–7.7)
Neutrophils Relative %: 57 %
Platelet Count: 252 10*3/uL (ref 150–400)
RBC: 4.95 MIL/uL (ref 4.22–5.81)
RDW: 12.9 % (ref 11.5–15.5)
WBC Count: 7.3 10*3/uL (ref 4.0–10.5)
nRBC: 0 % (ref 0.0–0.2)

## 2020-08-01 LAB — CMP (CANCER CENTER ONLY)
ALT: 313 U/L (ref 0–44)
AST: 174 U/L (ref 15–41)
Albumin: 4.3 g/dL (ref 3.5–5.0)
Alkaline Phosphatase: 107 U/L (ref 38–126)
Anion gap: 9 (ref 5–15)
BUN: 15 mg/dL (ref 6–20)
CO2: 30 mmol/L (ref 22–32)
Calcium: 9.6 mg/dL (ref 8.9–10.3)
Chloride: 102 mmol/L (ref 98–111)
Creatinine: 1.51 mg/dL — ABNORMAL HIGH (ref 0.61–1.24)
GFR, Est AFR Am: >60 mL/min (ref >60)
GFR, Estimated: 52 mL/min — ABNORMAL LOW (ref >60)
Glucose, Bld: 85 mg/dL (ref 70–99)
Potassium: 5.1 mmol/L (ref 3.5–5.1)
Sodium: 141 mmol/L (ref 135–145)
Total Bilirubin: 1.1 mg/dL (ref 0.3–1.2)
Total Protein: 8.1 g/dL (ref 6.5–8.1)

## 2020-08-01 LAB — TSH: TSH: 59.097 u[IU]/mL — ABNORMAL HIGH (ref 0.320–4.118)

## 2020-08-01 NOTE — Telephone Encounter (Signed)
CRITICAL VALUE STICKER  CRITICAL VALUE: AST 174 and ALT 313  RECEIVER (on-site recipient of call): Paula Compton  DATE & TIME NOTIFIED: 08/01/20 @ 1418  MESSENGER: Aura Fey, RN  MD NOTIFIED: Dr. Zola Button  TIME OF NOTIFICATION: 8830  RESPONSE: Acknowledged.

## 2020-08-02 ENCOUNTER — Other Ambulatory Visit: Payer: Self-pay

## 2020-08-02 ENCOUNTER — Inpatient Hospital Stay (HOSPITAL_BASED_OUTPATIENT_CLINIC_OR_DEPARTMENT_OTHER): Payer: BC Managed Care – PPO | Admitting: Oncology

## 2020-08-02 ENCOUNTER — Telehealth: Payer: Self-pay | Admitting: Oncology

## 2020-08-02 VITALS — BP 105/75 | HR 65 | Temp 97.3°F | Resp 18 | Wt 254.4 lb

## 2020-08-02 DIAGNOSIS — R109 Unspecified abdominal pain: Secondary | ICD-10-CM | POA: Diagnosis not present

## 2020-08-02 DIAGNOSIS — R509 Fever, unspecified: Secondary | ICD-10-CM | POA: Diagnosis not present

## 2020-08-02 DIAGNOSIS — K754 Autoimmune hepatitis: Secondary | ICD-10-CM | POA: Diagnosis not present

## 2020-08-02 DIAGNOSIS — K7689 Other specified diseases of liver: Secondary | ICD-10-CM | POA: Diagnosis not present

## 2020-08-02 DIAGNOSIS — C649 Malignant neoplasm of unspecified kidney, except renal pelvis: Secondary | ICD-10-CM | POA: Diagnosis not present

## 2020-08-02 DIAGNOSIS — C641 Malignant neoplasm of right kidney, except renal pelvis: Secondary | ICD-10-CM | POA: Diagnosis not present

## 2020-08-02 DIAGNOSIS — Z7952 Long term (current) use of systemic steroids: Secondary | ICD-10-CM | POA: Diagnosis not present

## 2020-08-02 DIAGNOSIS — Z880 Allergy status to penicillin: Secondary | ICD-10-CM | POA: Diagnosis not present

## 2020-08-02 DIAGNOSIS — R7989 Other specified abnormal findings of blood chemistry: Secondary | ICD-10-CM | POA: Diagnosis not present

## 2020-08-02 DIAGNOSIS — E039 Hypothyroidism, unspecified: Secondary | ICD-10-CM | POA: Diagnosis not present

## 2020-08-02 DIAGNOSIS — Z79899 Other long term (current) drug therapy: Secondary | ICD-10-CM | POA: Diagnosis not present

## 2020-08-02 DIAGNOSIS — I10 Essential (primary) hypertension: Secondary | ICD-10-CM | POA: Diagnosis not present

## 2020-08-02 DIAGNOSIS — R5383 Other fatigue: Secondary | ICD-10-CM | POA: Diagnosis not present

## 2020-08-02 DIAGNOSIS — R7401 Elevation of levels of liver transaminase levels: Secondary | ICD-10-CM | POA: Diagnosis not present

## 2020-08-02 DIAGNOSIS — Z905 Acquired absence of kidney: Secondary | ICD-10-CM | POA: Diagnosis not present

## 2020-08-02 NOTE — Progress Notes (Signed)
Hematology and Oncology Follow Up Visit  Michael [name] [id number] September 22, 1966 54 y.o. 08/02/2020 9:28 AM Pcp, NoNo ref. provider found   Principle Diagnosis: 54 year old man with kidney cancer diagnosed in 2020.  Today he developed stage IV clear-cell histology in March 2021 with pulmonary involvement.  Prior Therapy: He underwent a right nephrectomy on August 27, 2019 performed while he was living in [place].  The final pathology showed clear cell renal cell carcinoma, nuclear grade 3 with 10% necrosis negative margins.  Final pathological stage was T2ANX.  He is status post Pembrolizumab and axitinib started in March 2021.  He received two cycles of therapy.  Therapy was interrupted because of increased LFTs and autoimmune hepatitis.    He has been receiving his therapy in [place] and relocated to this area.  Axitinib 5 mg daily started on July 05, 2020.  Therapy discontinued on July 13, 2020 because of elevated LFTs.  Current therapy: Active surveillance.  Interim History: Michael [name] is here for a follow-up visit.  Since the last visit, he reports no major changes in his health.  He denies any recent hospitalization or illnesses.  He denies any abdominal pain chest pain or difficulty breathing.  His performance status and activity level remain excellent.     Medications: Unchanged on review. Current Outpatient Medications  Medication Sig Dispense Refill  . amLODipine (NORVASC) 10 MG tablet Take 10 mg by mouth daily.    . cholecalciferol (VITAMIN D3) 25 MCG (1000 UNIT) tablet Take 2,000 Units by mouth daily.    Marland Kitchen levothyroxine (SYNTHROID) 75 MCG tablet Take 1 tablet (75 mcg total) by mouth every morning. 90 tablet 3  . montelukast (SINGULAIR) 10 MG tablet Take 10 mg by mouth at bedtime.    . predniSONE (DELTASONE) 5 MG tablet Take 1 tablet (5 mg total) by mouth daily with breakfast. (Patient not taking: Reported on 07/13/2020) 7 tablet 0  . tadalafil (CIALIS) 20 MG tablet Take  20 mg by mouth daily as needed for erectile dysfunction.    Marland Kitchen zinc gluconate 50 MG tablet Take 50 mg by mouth daily.     No current facility-administered medications for this visit.     Allergies:  Allergies  Allergen Reactions  . Penicillins Other (See Comments)    Childhood allergies- unk      Physical Exam:    ECOG: 0     General appearance: Comfortable appearing without any discomfort Head: Normocephalic without any trauma Oropharynx: Mucous membranes are moist and pink without any thrush or ulcers. Eyes: Pupils are equal and round reactive to light. Lymph nodes: No cervical, supraclavicular, inguinal or axillary lymphadenopathy.   Heart:regular rate and rhythm.  S1 and S2 without leg edema. Lung: Clear without any rhonchi or wheezes.  No dullness to percussion. Abdomin: Soft, nontender, nondistended with good bowel sounds.  No hepatosplenomegaly. Musculoskeletal: No joint deformity or effusion.  Full range of motion noted. Neurological: No deficits noted on motor, sensory and deep tendon reflex exam. Skin: No petechial rash or dryness.  Appeared moist.        Lab Results: Lab Results  Component Value Date   WBC 7.3 08/01/2020   HGB 15.0 08/01/2020   HCT 45.5 08/01/2020   MCV 91.9 08/01/2020   PLT 252 08/01/2020     Chemistry      Component Value Date/Time   NA 141 08/01/2020 1317   K 5.1 08/01/2020 1317   CL 102 08/01/2020 1317   CO2 30 08/01/2020 1317   BUN 15  08/01/2020 1317   CREATININE 1.51 (H) 08/01/2020 1317      Component Value Date/Time   CALCIUM 9.6 08/01/2020 1317   ALKPHOS 107 08/01/2020 1317   AST 174 (HH) 08/01/2020 1317   ALT 313 (HH) 08/01/2020 1317   BILITOT 1.1 08/01/2020 1317       Impression and Plan:  54 year old with:  1.    Kidney cancer diagnosed in October 2020.  He developed stage IV clear-cell with pulmonary involvement in March 2021.    He is off treatment at this time because of increase liver function test  related to axitinib.  Laboratory data on 08/01/2020 were reviewed and continues to show gradual improvement in his liver function tests including AST, ALT and bilirubin.  His bilirubin is normal with normal electrolytes.  Treatment options moving forward were discussed at this time.  I recommended continuing: Treatments given his increase in his transaminases though they have completely normalized.  Alternative treatment options would include immunotherapy with nivolumab versus cabozantinib or combination proved.  Given his limited disease at this time there will be no urgency to start treatment.  He is agreeable with this plan.   2.  Hypothyroidism: His TSH continues to improve at this time on thyroid replacement.  3.  Hypertension:  His blood pressure is within normal range on amlodipine.  We will continue to monitor.  4.  Goals of care and prognosis: His disease is incurable although aggressive measures are warranted given his excellent performance status.  5.  Elevated AST and ALT: Medication related and continues to improve.  No intervention is needed at this time.  6.  Follow-up:  He will return in 2 weeks for laboratory testing in 4 weeks for MD follow-up.  30  minutes were spent on this encounter.  The time was dedicated to updating his disease status, reviewing laboratory data and addressing future plan.    Zola Button, MD 9/29/20219:28 AM

## 2020-08-02 NOTE — Telephone Encounter (Signed)
Released records to [facility] to [phone]  Release:  [id number]

## 2020-08-16 ENCOUNTER — Other Ambulatory Visit: Payer: Self-pay

## 2020-08-16 ENCOUNTER — Inpatient Hospital Stay: Payer: BC Managed Care – PPO | Attending: Oncology

## 2020-08-16 DIAGNOSIS — C641 Malignant neoplasm of right kidney, except renal pelvis: Secondary | ICD-10-CM | POA: Diagnosis not present

## 2020-08-16 DIAGNOSIS — Z905 Acquired absence of kidney: Secondary | ICD-10-CM | POA: Insufficient documentation

## 2020-08-16 DIAGNOSIS — Z79899 Other long term (current) drug therapy: Secondary | ICD-10-CM | POA: Insufficient documentation

## 2020-08-16 DIAGNOSIS — Z880 Allergy status to penicillin: Secondary | ICD-10-CM | POA: Diagnosis not present

## 2020-08-16 DIAGNOSIS — R7989 Other specified abnormal findings of blood chemistry: Secondary | ICD-10-CM | POA: Diagnosis not present

## 2020-08-16 DIAGNOSIS — C649 Malignant neoplasm of unspecified kidney, except renal pelvis: Secondary | ICD-10-CM

## 2020-08-16 DIAGNOSIS — I10 Essential (primary) hypertension: Secondary | ICD-10-CM | POA: Diagnosis not present

## 2020-08-16 DIAGNOSIS — E039 Hypothyroidism, unspecified: Secondary | ICD-10-CM | POA: Insufficient documentation

## 2020-08-16 DIAGNOSIS — K754 Autoimmune hepatitis: Secondary | ICD-10-CM | POA: Diagnosis not present

## 2020-08-16 DIAGNOSIS — R600 Localized edema: Secondary | ICD-10-CM | POA: Diagnosis not present

## 2020-08-16 LAB — CMP (CANCER CENTER ONLY)
ALT: 64 U/L — ABNORMAL HIGH (ref 0–44)
AST: 44 U/L — ABNORMAL HIGH (ref 15–41)
Albumin: 3.9 g/dL (ref 3.5–5.0)
Alkaline Phosphatase: 82 U/L (ref 38–126)
Anion gap: 5 (ref 5–15)
BUN: 15 mg/dL (ref 6–20)
CO2: 33 mmol/L — ABNORMAL HIGH (ref 22–32)
Calcium: 9.5 mg/dL (ref 8.9–10.3)
Chloride: 105 mmol/L (ref 98–111)
Creatinine: 1.36 mg/dL — ABNORMAL HIGH (ref 0.61–1.24)
GFR, Estimated: 59 mL/min — ABNORMAL LOW (ref >60)
Glucose, Bld: 131 mg/dL — ABNORMAL HIGH (ref 70–99)
Potassium: 4.4 mmol/L (ref 3.5–5.1)
Sodium: 143 mmol/L (ref 135–145)
Total Bilirubin: 0.7 mg/dL (ref 0.3–1.2)
Total Protein: 7.3 g/dL (ref 6.5–8.1)

## 2020-08-16 LAB — TSH: TSH: 35.587 u[IU]/mL — ABNORMAL HIGH (ref 0.320–4.118)

## 2020-08-16 LAB — CBC WITH DIFFERENTIAL (CANCER CENTER ONLY)
Abs Immature Granulocytes: 0.02 10*3/uL (ref 0.00–0.07)
Basophils Absolute: 0 10*3/uL (ref 0.0–0.1)
Basophils Relative: 1 %
Eosinophils Absolute: 0.2 10*3/uL (ref 0.0–0.5)
Eosinophils Relative: 4 %
HCT: 42.3 % (ref 39.0–52.0)
Hemoglobin: 13.8 g/dL (ref 13.0–17.0)
Immature Granulocytes: 0 %
Lymphocytes Relative: 35 %
Lymphs Abs: 2.2 10*3/uL (ref 0.7–4.0)
MCH: 30.3 pg (ref 26.0–34.0)
MCHC: 32.6 g/dL (ref 30.0–36.0)
MCV: 93 fL (ref 80.0–100.0)
Monocytes Absolute: 0.4 10*3/uL (ref 0.1–1.0)
Monocytes Relative: 7 %
Neutro Abs: 3.4 10*3/uL (ref 1.7–7.7)
Neutrophils Relative %: 53 %
Platelet Count: 193 10*3/uL (ref 150–400)
RBC: 4.55 MIL/uL (ref 4.22–5.81)
RDW: 12.9 % (ref 11.5–15.5)
WBC Count: 6.3 10*3/uL (ref 4.0–10.5)
nRBC: 0 % (ref 0.0–0.2)

## 2020-08-25 ENCOUNTER — Telehealth: Payer: Self-pay | Admitting: Oncology

## 2020-08-25 NOTE — Telephone Encounter (Signed)
Scheduled per 09/29 los, patient has been called and notified.

## 2020-08-30 ENCOUNTER — Inpatient Hospital Stay: Payer: BC Managed Care – PPO

## 2020-08-30 ENCOUNTER — Other Ambulatory Visit: Payer: Self-pay

## 2020-08-30 DIAGNOSIS — E039 Hypothyroidism, unspecified: Secondary | ICD-10-CM | POA: Diagnosis not present

## 2020-08-30 DIAGNOSIS — Z79899 Other long term (current) drug therapy: Secondary | ICD-10-CM | POA: Diagnosis not present

## 2020-08-30 DIAGNOSIS — Z880 Allergy status to penicillin: Secondary | ICD-10-CM | POA: Diagnosis not present

## 2020-08-30 DIAGNOSIS — C641 Malignant neoplasm of right kidney, except renal pelvis: Secondary | ICD-10-CM | POA: Diagnosis not present

## 2020-08-30 DIAGNOSIS — C649 Malignant neoplasm of unspecified kidney, except renal pelvis: Secondary | ICD-10-CM

## 2020-08-30 DIAGNOSIS — R7989 Other specified abnormal findings of blood chemistry: Secondary | ICD-10-CM | POA: Diagnosis not present

## 2020-08-30 DIAGNOSIS — R600 Localized edema: Secondary | ICD-10-CM | POA: Diagnosis not present

## 2020-08-30 DIAGNOSIS — I10 Essential (primary) hypertension: Secondary | ICD-10-CM | POA: Diagnosis not present

## 2020-08-30 DIAGNOSIS — Z905 Acquired absence of kidney: Secondary | ICD-10-CM | POA: Diagnosis not present

## 2020-08-30 DIAGNOSIS — K754 Autoimmune hepatitis: Secondary | ICD-10-CM | POA: Diagnosis not present

## 2020-08-30 LAB — CBC WITH DIFFERENTIAL (CANCER CENTER ONLY)
Abs Immature Granulocytes: 0.02 K/uL (ref 0.00–0.07)
Basophils Absolute: 0 K/uL (ref 0.0–0.1)
Basophils Relative: 0 %
Eosinophils Absolute: 0.2 K/uL (ref 0.0–0.5)
Eosinophils Relative: 3 %
HCT: 43.5 % (ref 39.0–52.0)
Hemoglobin: 14.5 g/dL (ref 13.0–17.0)
Immature Granulocytes: 0 %
Lymphocytes Relative: 32 %
Lymphs Abs: 2.1 K/uL (ref 0.7–4.0)
MCH: 30 pg (ref 26.0–34.0)
MCHC: 33.3 g/dL (ref 30.0–36.0)
MCV: 90.1 fL (ref 80.0–100.0)
Monocytes Absolute: 0.6 K/uL (ref 0.1–1.0)
Monocytes Relative: 9 %
Neutro Abs: 3.7 K/uL (ref 1.7–7.7)
Neutrophils Relative %: 56 %
Platelet Count: 235 K/uL (ref 150–400)
RBC: 4.83 MIL/uL (ref 4.22–5.81)
RDW: 12.8 % (ref 11.5–15.5)
WBC Count: 6.6 K/uL (ref 4.0–10.5)
nRBC: 0 % (ref 0.0–0.2)

## 2020-08-30 LAB — CMP (CANCER CENTER ONLY)
ALT: 37 U/L (ref 0–44)
AST: 33 U/L (ref 15–41)
Albumin: 4.2 g/dL (ref 3.5–5.0)
Alkaline Phosphatase: 85 U/L (ref 38–126)
Anion gap: 8 (ref 5–15)
BUN: 18 mg/dL (ref 6–20)
CO2: 27 mmol/L (ref 22–32)
Calcium: 9.5 mg/dL (ref 8.9–10.3)
Chloride: 105 mmol/L (ref 98–111)
Creatinine: 1.43 mg/dL — ABNORMAL HIGH (ref 0.61–1.24)
GFR, Estimated: 59 mL/min — ABNORMAL LOW
Glucose, Bld: 105 mg/dL — ABNORMAL HIGH (ref 70–99)
Potassium: 4.2 mmol/L (ref 3.5–5.1)
Sodium: 140 mmol/L (ref 135–145)
Total Bilirubin: 0.8 mg/dL (ref 0.3–1.2)
Total Protein: 7.4 g/dL (ref 6.5–8.1)

## 2020-08-30 LAB — TSH: TSH: 35.06 u[IU]/mL — ABNORMAL HIGH (ref 0.320–4.118)

## 2020-08-31 ENCOUNTER — Inpatient Hospital Stay (HOSPITAL_BASED_OUTPATIENT_CLINIC_OR_DEPARTMENT_OTHER): Payer: BC Managed Care – PPO | Admitting: Oncology

## 2020-08-31 VITALS — BP 135/88 | HR 70 | Temp 98.0°F | Resp 17 | Ht 66.0 in | Wt 255.7 lb

## 2020-08-31 DIAGNOSIS — Z880 Allergy status to penicillin: Secondary | ICD-10-CM | POA: Diagnosis not present

## 2020-08-31 DIAGNOSIS — Z79899 Other long term (current) drug therapy: Secondary | ICD-10-CM | POA: Diagnosis not present

## 2020-08-31 DIAGNOSIS — R600 Localized edema: Secondary | ICD-10-CM | POA: Diagnosis not present

## 2020-08-31 DIAGNOSIS — C649 Malignant neoplasm of unspecified kidney, except renal pelvis: Secondary | ICD-10-CM | POA: Diagnosis not present

## 2020-08-31 DIAGNOSIS — Z905 Acquired absence of kidney: Secondary | ICD-10-CM | POA: Diagnosis not present

## 2020-08-31 DIAGNOSIS — E039 Hypothyroidism, unspecified: Secondary | ICD-10-CM | POA: Diagnosis not present

## 2020-08-31 DIAGNOSIS — R7989 Other specified abnormal findings of blood chemistry: Secondary | ICD-10-CM | POA: Diagnosis not present

## 2020-08-31 DIAGNOSIS — C641 Malignant neoplasm of right kidney, except renal pelvis: Secondary | ICD-10-CM | POA: Diagnosis not present

## 2020-08-31 DIAGNOSIS — I10 Essential (primary) hypertension: Secondary | ICD-10-CM | POA: Diagnosis not present

## 2020-08-31 DIAGNOSIS — K754 Autoimmune hepatitis: Secondary | ICD-10-CM | POA: Diagnosis not present

## 2020-08-31 MED ORDER — LEVOTHYROXINE SODIUM 100 MCG PO TABS: 100.0000 ug | ORAL_TABLET | Freq: Every day | ORAL | 0 refills | Status: DC

## 2020-08-31 NOTE — Progress Notes (Signed)
Hematology and Oncology Follow Up Visit  Michael [name] [id number] 1965/12/10 54 y.o. 08/31/2020 3:57 PM Pcp, NoNo ref. provider found   Principle Diagnosis: 54 year old man with stage IV clear-cell renal cell carcinoma diagnosed in 2020.  Developed pulmonary involvement after right nephrectomy.  Prior Therapy: He underwent a right nephrectomy on August 27, 2019 performed while he was living in [place].  The final pathology showed clear cell renal cell carcinoma, nuclear grade 3 with 10% necrosis negative margins.  Final pathological stage was T2ANX.  He is status post Pembrolizumab and axitinib started in March 2021.  He received two cycles of therapy.  Therapy was interrupted because of increased LFTs and autoimmune hepatitis.    He has been receiving his therapy in [place] and relocated to this area.  Axitinib 5 mg daily started on July 05, 2020.  Therapy discontinued on July 13, 2020 because of elevated LFTs.  Current therapy: Active surveillance.  Interim History: Michael [name] is here for return evaluation.  Since the last visit, he reports no major changes in his health.  He denies any nausea vomiting or abdominal pain.  He denies any recent hospitalization or illnesses.  Full status quality of life remained excellent.  He still has residual ankle edema.    Medications: Updated on review. Current Outpatient Medications  Medication Sig Dispense Refill  . amLODipine (NORVASC) 10 MG tablet Take 10 mg by mouth daily.    . cholecalciferol (VITAMIN D3) 25 MCG (1000 UNIT) tablet Take 2,000 Units by mouth daily.    Marland Kitchen levothyroxine (SYNTHROID) 75 MCG tablet Take 1 tablet (75 mcg total) by mouth every morning. 90 tablet 3  . montelukast (SINGULAIR) 10 MG tablet Take 10 mg by mouth at bedtime.    . predniSONE (DELTASONE) 5 MG tablet Take 1 tablet (5 mg total) by mouth daily with breakfast. (Patient not taking: Reported on 07/13/2020) 7 tablet 0  . tadalafil (CIALIS) 20 MG tablet Take  20 mg by mouth daily as needed for erectile dysfunction.    Marland Kitchen zinc gluconate 50 MG tablet Take 50 mg by mouth daily.     No current facility-administered medications for this visit.     Allergies:  Allergies  Allergen Reactions  . Penicillins Other (See Comments)    Childhood allergies- unk      Physical Exam:  Blood pressure 135/88, pulse 70, temperature 98 F (36.7 C), temperature source Tympanic, resp. rate 17, height 5\' 6"  (1.676 m), weight 255 lb 11.2 oz (116 kg), SpO2 100 %.    ECOG: 0    General appearance: Alert, awake without any distress. Head: Atraumatic without abnormalities Oropharynx: Without any thrush or ulcers. Eyes: No scleral icterus. Lymph nodes: No lymphadenopathy noted in the cervical, supraclavicular, or axillary nodes Heart:regular rate and rhythm, without any murmurs or gallops.   Lung: Clear to auscultation without any rhonchi, wheezes or dullness to percussion. Abdomin: Soft, nontender without any shifting dullness or ascites. Musculoskeletal: No clubbing or cyanosis. Neurological: No motor or sensory deficits. Skin: No rashes or lesions.        Lab Results: Lab Results  Component Value Date   WBC 6.6 08/30/2020   HGB 14.5 08/30/2020   HCT 43.5 08/30/2020   MCV 90.1 08/30/2020   PLT 235 08/30/2020     Chemistry      Component Value Date/Time   NA 140 08/30/2020 1014   K 4.2 08/30/2020 1014   CL 105 08/30/2020 1014   CO2 27 08/30/2020 1014   BUN 18  08/30/2020 1014   CREATININE 1.43 (H) 08/30/2020 1014      Component Value Date/Time   CALCIUM 9.5 08/30/2020 1014   ALKPHOS 85 08/30/2020 1014   AST 33 08/30/2020 1014   ALT 37 08/30/2020 1014   BILITOT 0.8 08/30/2020 1014       Impression and Plan:  54 year old with:  1.    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     The natural course of this disease was updated and treatment options were reviewed.  He has been off  treatment for the last 7 weeks after developing hepatotoxicity to axitinib.  Treatment options moving forward were reviewed at this time.  Combination immunotherapy, single agent immunotherapy or different oral targeted therapy were reviewed.  Pembrolizumab alone versus ipilimumab and nivolumab were discussed.  Single agent cabozantinib could also be used in the setting.  Continued active surveillance could also be a possibility given his very minimal residual disease based on imaging studies in August 2021.  I recommended updating his CT scan in November and consider salvage therapy at that time if he has progression of disease.  He is agreeable with this plan.   2.  Hypothyroidism: He continues to have a TSH and I will increase his Synthroid replacement.  3.  Hypertension:  His blood pressure is close to normal range on amlodipine.  4.  Goals of care and prognosis: Therapy remains palliative although aggressive measures are warranted given his excellent performance status.   5.  Elevated AST and ALT: Resolved at this time after discontinuation of axitinib.  6.  Follow-up:  Will be in the next 2 to 3 weeks for repeat evaluation after imaging studies.  30  minutes were dedicated to this visit.  Time was spent on reviewing his disease status, discussing treatment options and future plan of care review.    Zola Button, MD 10/28/20213:57 PM

## 2020-09-11 ENCOUNTER — Telehealth: Payer: Self-pay

## 2020-09-11 NOTE — Telephone Encounter (Signed)
Pt LM wanting to know if his upcoming CT Scan is with or without contrast.   I attempted to call the tp back to advise it is w/o contrast. His voicemailbox is full. I was unable to leave message.

## 2020-09-14 ENCOUNTER — Inpatient Hospital Stay: Payer: BC Managed Care – PPO

## 2020-09-18 ENCOUNTER — Other Ambulatory Visit: Payer: Self-pay

## 2020-09-18 ENCOUNTER — Ambulatory Visit (HOSPITAL_COMMUNITY)
Admission: RE | Admit: 2020-09-18 | Discharge: 2020-09-18 | Disposition: A | Discharge status: Home / Self Care | Payer: BC Managed Care – PPO | Source: Ambulatory Visit | Attending: Oncology | Admitting: Oncology

## 2020-09-18 ENCOUNTER — Inpatient Hospital Stay: Payer: BC Managed Care – PPO | Attending: Oncology

## 2020-09-18 DIAGNOSIS — R911 Solitary pulmonary nodule: Secondary | ICD-10-CM | POA: Diagnosis not present

## 2020-09-18 DIAGNOSIS — Z905 Acquired absence of kidney: Secondary | ICD-10-CM | POA: Diagnosis not present

## 2020-09-18 DIAGNOSIS — Z880 Allergy status to penicillin: Secondary | ICD-10-CM | POA: Insufficient documentation

## 2020-09-18 DIAGNOSIS — Z7952 Long term (current) use of systemic steroids: Secondary | ICD-10-CM | POA: Diagnosis not present

## 2020-09-18 DIAGNOSIS — C641 Malignant neoplasm of right kidney, except renal pelvis: Secondary | ICD-10-CM | POA: Insufficient documentation

## 2020-09-18 DIAGNOSIS — Z79899 Other long term (current) drug therapy: Secondary | ICD-10-CM | POA: Insufficient documentation

## 2020-09-18 DIAGNOSIS — R918 Other nonspecific abnormal finding of lung field: Secondary | ICD-10-CM | POA: Diagnosis not present

## 2020-09-18 DIAGNOSIS — K754 Autoimmune hepatitis: Secondary | ICD-10-CM | POA: Diagnosis not present

## 2020-09-18 DIAGNOSIS — C649 Malignant neoplasm of unspecified kidney, except renal pelvis: Secondary | ICD-10-CM | POA: Insufficient documentation

## 2020-09-18 DIAGNOSIS — I10 Essential (primary) hypertension: Secondary | ICD-10-CM | POA: Insufficient documentation

## 2020-09-18 DIAGNOSIS — N400 Enlarged prostate without lower urinary tract symptoms: Secondary | ICD-10-CM | POA: Insufficient documentation

## 2020-09-18 DIAGNOSIS — E039 Hypothyroidism, unspecified: Secondary | ICD-10-CM | POA: Diagnosis not present

## 2020-09-18 LAB — CMP (CANCER CENTER ONLY)
ALT: 32 U/L (ref 0–44)
AST: 30 U/L (ref 15–41)
Albumin: 4.1 g/dL (ref 3.5–5.0)
Alkaline Phosphatase: 77 U/L (ref 38–126)
Anion gap: 7 (ref 5–15)
BUN: 12 mg/dL (ref 6–20)
CO2: 30 mmol/L (ref 22–32)
Calcium: 8.8 mg/dL — ABNORMAL LOW (ref 8.9–10.3)
Chloride: 106 mmol/L (ref 98–111)
Creatinine: 1.39 mg/dL — ABNORMAL HIGH (ref 0.61–1.24)
GFR, Estimated: >60 mL/min (ref >60)
Glucose, Bld: 106 mg/dL — ABNORMAL HIGH (ref 70–99)
Potassium: 4.9 mmol/L (ref 3.5–5.1)
Sodium: 143 mmol/L (ref 135–145)
Total Bilirubin: 0.7 mg/dL (ref 0.3–1.2)
Total Protein: 7.3 g/dL (ref 6.5–8.1)

## 2020-09-18 LAB — CBC WITH DIFFERENTIAL (CANCER CENTER ONLY)
Abs Immature Granulocytes: 0.01 10*3/uL (ref 0.00–0.07)
Basophils Absolute: 0 10*3/uL (ref 0.0–0.1)
Basophils Relative: 1 %
Eosinophils Absolute: 0.2 10*3/uL (ref 0.0–0.5)
Eosinophils Relative: 4 %
HCT: 43.6 % (ref 39.0–52.0)
Hemoglobin: 14.7 g/dL (ref 13.0–17.0)
Immature Granulocytes: 0 %
Lymphocytes Relative: 32 %
Lymphs Abs: 2 10*3/uL (ref 0.7–4.0)
MCH: 30.1 pg (ref 26.0–34.0)
MCHC: 33.7 g/dL (ref 30.0–36.0)
MCV: 89.3 fL (ref 80.0–100.0)
Monocytes Absolute: 0.5 10*3/uL (ref 0.1–1.0)
Monocytes Relative: 8 %
Neutro Abs: 3.5 10*3/uL (ref 1.7–7.7)
Neutrophils Relative %: 55 %
Platelet Count: 231 10*3/uL (ref 150–400)
RBC: 4.88 MIL/uL (ref 4.22–5.81)
RDW: 12.8 % (ref 11.5–15.5)
WBC Count: 6.3 10*3/uL (ref 4.0–10.5)
nRBC: 0 % (ref 0.0–0.2)

## 2020-09-18 LAB — TSH: TSH: 20.349 u[IU]/mL — ABNORMAL HIGH (ref 0.320–4.118)

## 2020-09-18 IMAGING — CT CT CHEST W/O CM
2 of 4 series · 14 of 36 positions shown, 17 images · non-contrast
Comparison: [DATE]

CLINICAL DATA: Follow-up metastatic renal cell carcinoma. Previous
right nephrectomy, chemotherapy, and immunotherapy.

EXAM:
CT CHEST, ABDOMEN AND PELVIS WITHOUT CONTRAST
TECHNIQUE: Multidetector CT imaging of the chest, abdomen and pelvis was
performed following the standard protocol without IV contrast.

[Series 2: cap w/o · axial · non-contrast · 0.98mm/px · z∈[-704,-154]mm · 11 of 132 slices shown, 14 images]
[im 11/132  mediastinal]
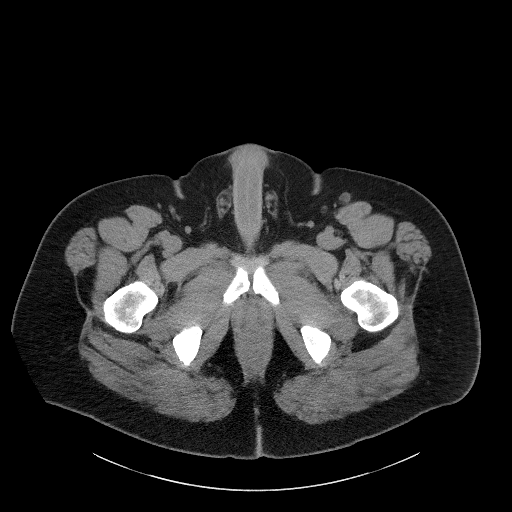
[im 11/132  lung]
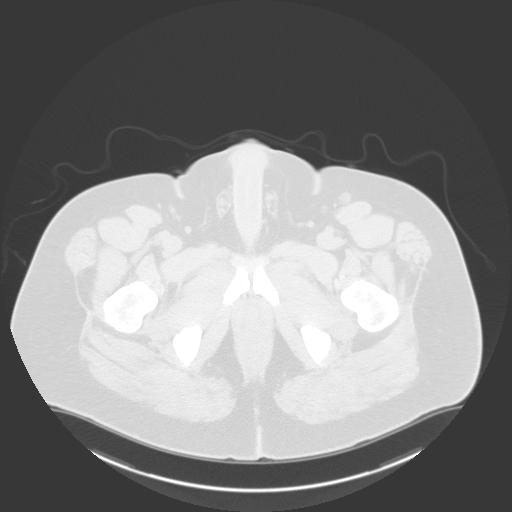
[im 22/132  lung]
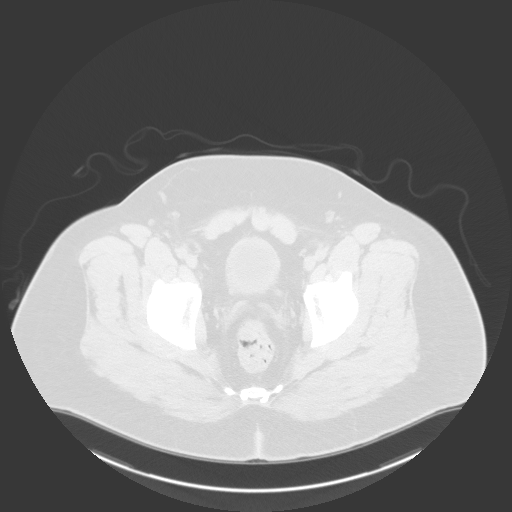
[im 33/132  lung]
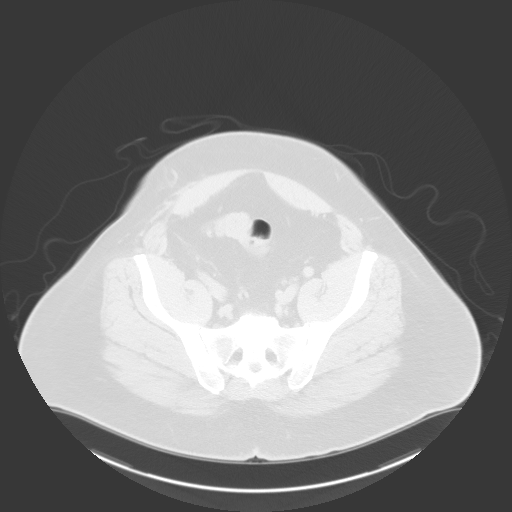
[im 44/132  lung]
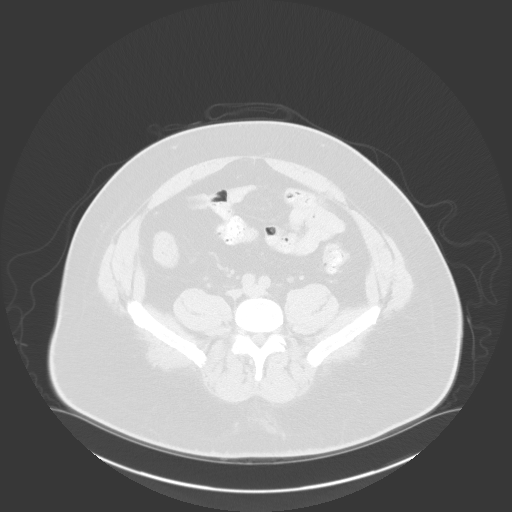
[im 55/132  mediastinal]
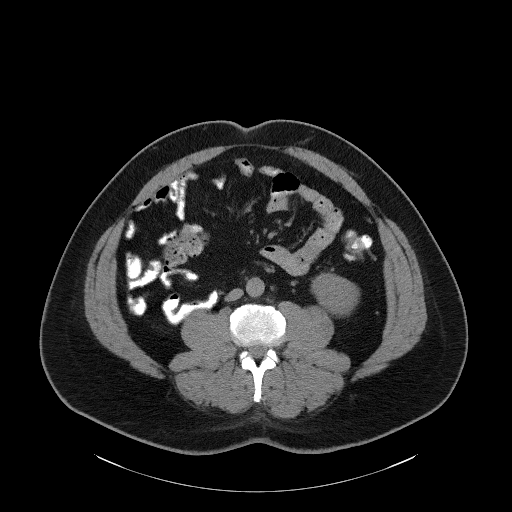
[im 55/132  lung]
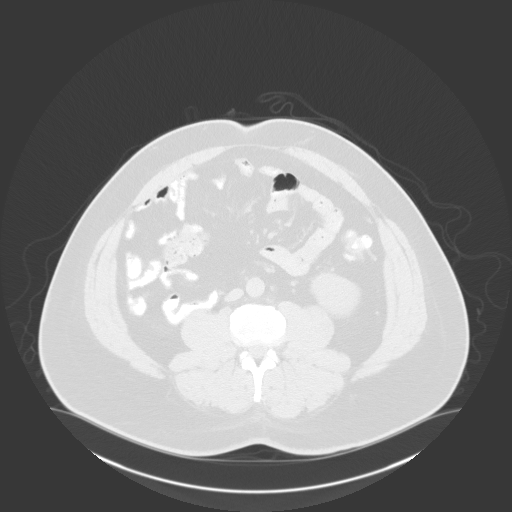
[im 66/132  lung]
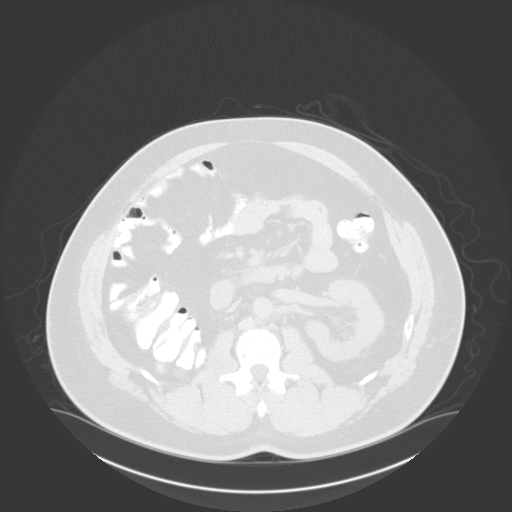
[im 77/132  lung]
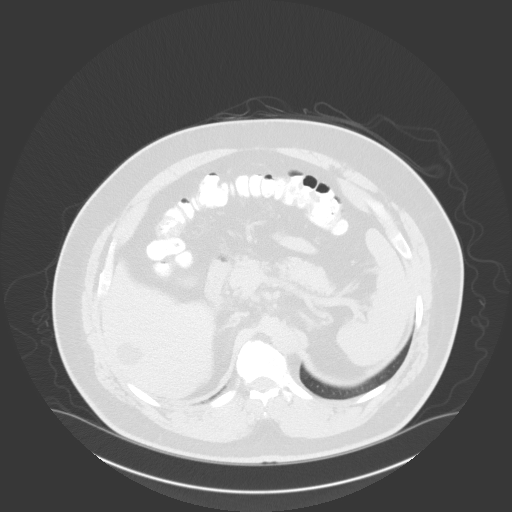
[im 88/132  lung]
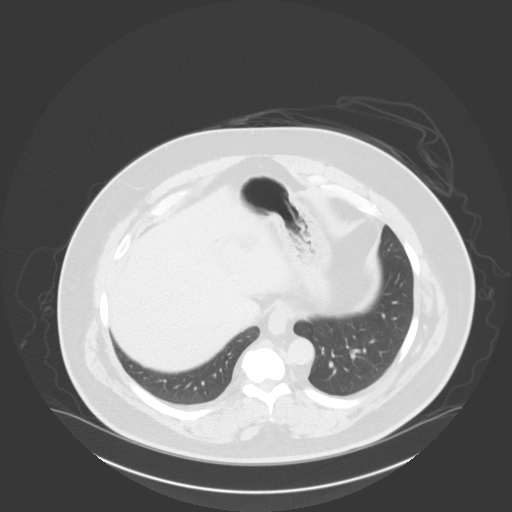
[im 99/132  mediastinal]
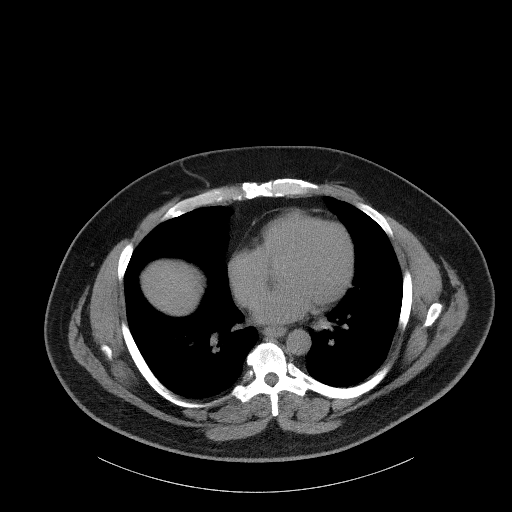
[im 99/132  lung]
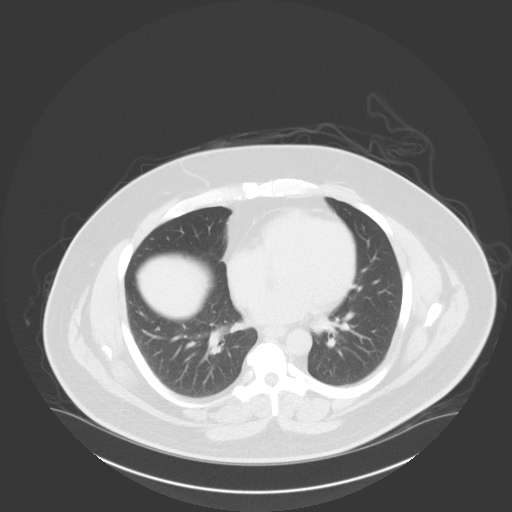
[im 110/132  lung]
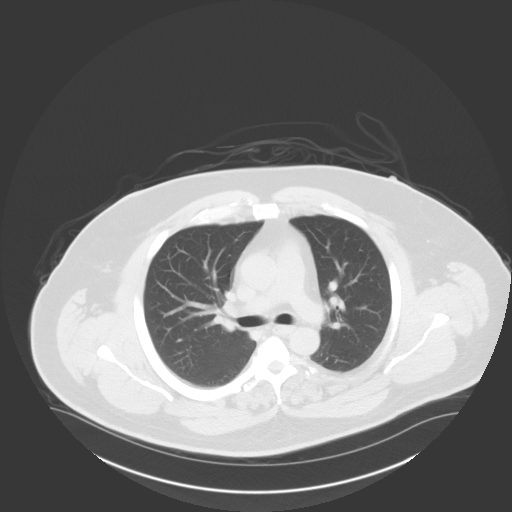
[im 121/132  lung]
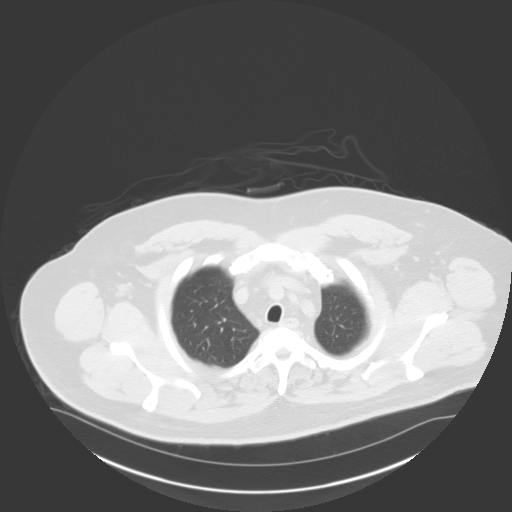

[Series 5: coronals · coronal · 0.82mm/px · 3 of 177 slices shown]
[im 36/177  lung]
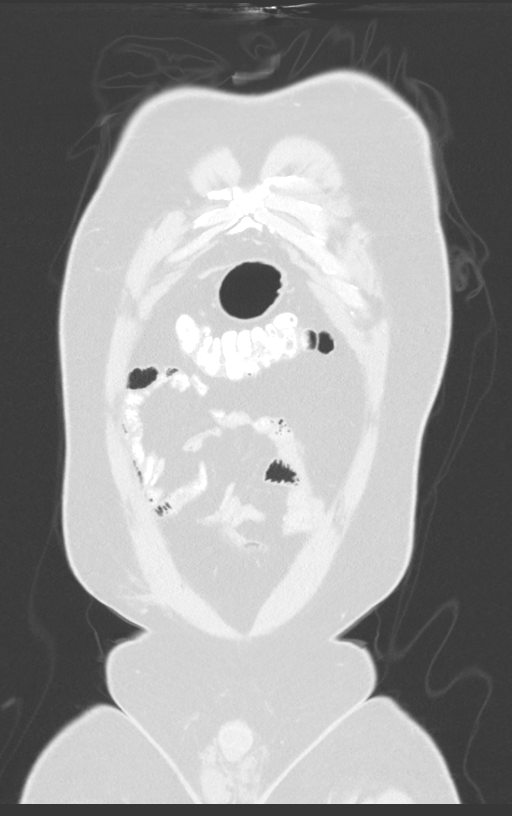
[im 71/177  lung]
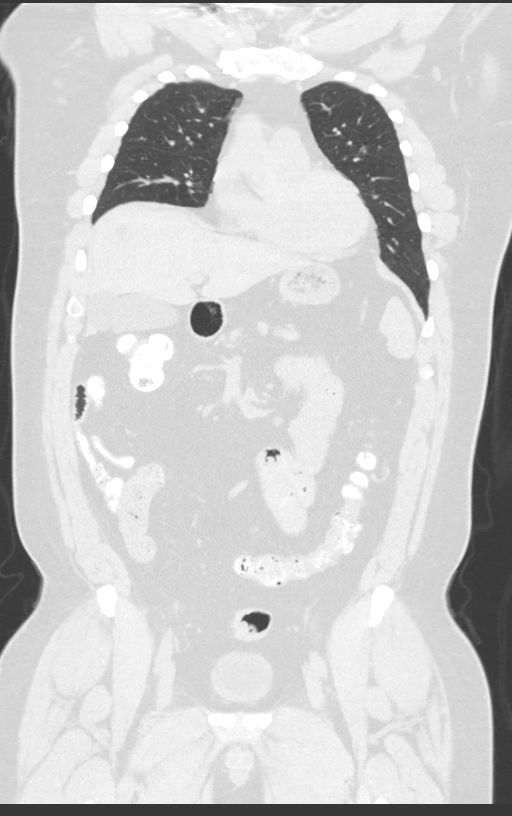
[im 106/177  lung]
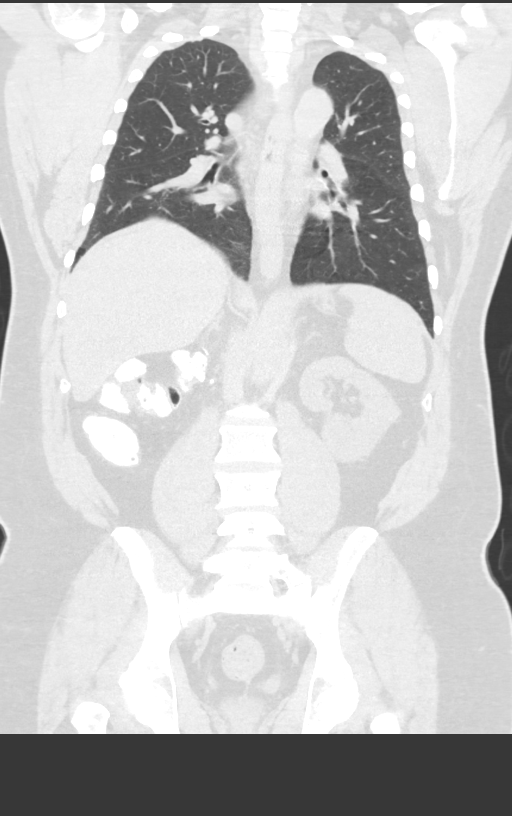

[14 of 36 positions shown; findings below may reference images not displayed]

FINDINGS: CT CHEST FINDINGS

Cardiovascular: No acute findings.

Mediastinum/Lymph Nodes: No masses or pathologically enlarged lymph
nodes identified on this unenhanced exam.

Lungs/Pleura: Several tiny less than 5 mm pulmonary nodules are
again seen in both lungs, which remains stable. 5 mm pulmonary
nodule in the anterior right middle lobe on image 68/4 has increased
from 2 mm on prior study. 4 mm pulmonary nodule in the posterior
right middle lobe on image 66/4 is also increased from 2 mm on prior
study. No new pulmonary nodules or masses identified. No evidence of
pulmonary infiltrate or pleural effusion.

Musculoskeletal:  No suspicious bone lesions identified.

CT ABDOMEN AND PELVIS FINDINGS

Hepatobiliary: Multiple fluid attenuation hepatic cysts show no
significant change. No masses visualized on this unenhanced exam.
Gallbladder is unremarkable. No evidence of biliary ductal
dilatation.

Pancreas: No mass or inflammatory changes identified on this
unenhanced exam.

Spleen:  Within normal limits in size.

Adrenals/Urinary Tract: Prior right nephrectomy again noted. No
evidence of mass in the nephrectomy bed. Left kidney is unremarkable
in appearance. No evidence of urolithiasis or hydronephrosis.

Stomach/Bowel: No evidence of obstruction, inflammatory process, or
abnormal fluid collections. Normal appendix visualized.

Vascular/Lymphatic: No pathologically enlarged lymph nodes
identified. No abdominal aortic aneurysm.

Reproductive:  Stable mildly enlarged prostate.

Other:  None.

Musculoskeletal:  No suspicious bone lesions identified.
IMPRESSION: Mild increase in size of sub-cm right middle lobe pulmonary nodules,
largest measuring 5 mm. Other sub-cm pulmonary nodules remain
stable.

No evidence of recurrent or metastatic carcinoma within the abdomen
or pelvis.

Stable mildly enlarged prostate.

## 2020-09-18 MED ORDER — IOHEXOL 9 MG/ML PO SOLN
500.0000 mL | ORAL | Status: AC
  Administered 2020-09-18: 1000 mL via ORAL

## 2020-09-18 MED ORDER — IOHEXOL 9 MG/ML PO SOLN
ORAL | Status: AC
  Filled 2020-09-18: qty 1000

## 2020-09-20 ENCOUNTER — Inpatient Hospital Stay (HOSPITAL_BASED_OUTPATIENT_CLINIC_OR_DEPARTMENT_OTHER): Payer: BC Managed Care – PPO | Admitting: Oncology

## 2020-09-20 ENCOUNTER — Other Ambulatory Visit: Payer: Self-pay

## 2020-09-20 VITALS — BP 132/89 | HR 78 | Temp 98.1°F | Resp 18 | Wt 254.6 lb

## 2020-09-20 DIAGNOSIS — K754 Autoimmune hepatitis: Secondary | ICD-10-CM | POA: Diagnosis not present

## 2020-09-20 DIAGNOSIS — Z7952 Long term (current) use of systemic steroids: Secondary | ICD-10-CM | POA: Diagnosis not present

## 2020-09-20 DIAGNOSIS — N400 Enlarged prostate without lower urinary tract symptoms: Secondary | ICD-10-CM | POA: Diagnosis not present

## 2020-09-20 DIAGNOSIS — I10 Essential (primary) hypertension: Secondary | ICD-10-CM | POA: Diagnosis not present

## 2020-09-20 DIAGNOSIS — C641 Malignant neoplasm of right kidney, except renal pelvis: Secondary | ICD-10-CM | POA: Diagnosis not present

## 2020-09-20 DIAGNOSIS — Z905 Acquired absence of kidney: Secondary | ICD-10-CM | POA: Diagnosis not present

## 2020-09-20 DIAGNOSIS — C649 Malignant neoplasm of unspecified kidney, except renal pelvis: Secondary | ICD-10-CM | POA: Diagnosis not present

## 2020-09-20 DIAGNOSIS — Z880 Allergy status to penicillin: Secondary | ICD-10-CM | POA: Diagnosis not present

## 2020-09-20 DIAGNOSIS — E039 Hypothyroidism, unspecified: Secondary | ICD-10-CM | POA: Diagnosis not present

## 2020-09-20 DIAGNOSIS — Z79899 Other long term (current) drug therapy: Secondary | ICD-10-CM | POA: Diagnosis not present

## 2020-09-20 NOTE — Progress Notes (Signed)
Hematology and Oncology Follow Up Visit  Michael [name] [id number] May 22, 1966 54 y.o. 09/20/2020 3:33 PM Pcp, NoNo ref. provider found   Principle Diagnosis: 54 year old man with kidney cancer diagnosed in October 2020.  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   Prior Therapy: He underwent a right nephrectomy on August 27, 2019 performed while he was living in [place].  The final pathology showed clear cell renal cell carcinoma, nuclear grade 3 with 10% necrosis negative margins.  Final pathological stage was T2ANX.  He is status post Pembrolizumab and axitinib started in March 2021.  He received two cycles of therapy.  Therapy was interrupted because of increased LFTs and autoimmune hepatitis.    He has been receiving his therapy in [place] and relocated to this area.  Axitinib 5 mg daily started on July 05, 2020.  Therapy discontinued on July 13, 2020 because of elevated LFTs.  Current therapy: Active surveillance.  Interim History: Michael [name] is here for a follow-up visit.  Since the last visit, he reports no major changes in his health.  He denies any recent hospitalization or illnesses.  He denies any shortness of breath, difficulty breathing or hemoptysis.  His lower extremity edema has improved at this time.  He denies any respiratory status complaint cough, wheezing or hemoptysis.    Medications: Unchanged on review Current Outpatient Medications  Medication Sig Dispense Refill  . amLODipine (NORVASC) 10 MG tablet Take 10 mg by mouth daily.    . cholecalciferol (VITAMIN D3) 25 MCG (1000 UNIT) tablet Take 2,000 Units by mouth daily.    Marland Kitchen levothyroxine (SYNTHROID) 100 MCG tablet Take 1 tablet (100 mcg total) by mouth daily before breakfast. 90 tablet 0  . montelukast (SINGULAIR) 10 MG tablet Take 10 mg by mouth at bedtime.    . predniSONE (DELTASONE) 5 MG tablet Take 1 tablet (5 mg total) by mouth daily with  breakfast. (Patient not taking: Reported on 07/13/2020) 7 tablet 0  . tadalafil (CIALIS) 20 MG tablet Take 20 mg by mouth daily as needed for erectile dysfunction.    Marland Kitchen zinc gluconate 50 MG tablet Take 50 mg by mouth daily.     No current facility-administered medications for this visit.     Allergies:  Allergies  Allergen Reactions  . Penicillins Other (See Comments)    Childhood allergies- unk      Physical Exam:  Blood pressure 132/89, pulse 78, temperature 98.1 F (36.7 C), temperature source Tympanic, resp. rate 18, weight 254 lb 9.6 oz (115.5 kg), SpO2 100 %.     ECOG: 0    General appearance: Comfortable appearing without any discomfort Head: Normocephalic without any trauma Oropharynx: Mucous membranes are moist and pink without any thrush or ulcers. Eyes: Pupils are equal and round reactive to light. Lymph nodes: No cervical, supraclavicular, inguinal or axillary lymphadenopathy.   Heart:regular rate and rhythm.  S1 and S2 without leg edema. Lung: Clear without any rhonchi or wheezes.  No dullness to percussion. Abdomin: Soft, nontender, nondistended with good bowel sounds.  No hepatosplenomegaly. Musculoskeletal: No joint deformity or effusion.  Full range of motion noted. Neurological: No deficits noted on motor, sensory and deep tendon reflex exam. Skin: No petechial rash or dryness.  Appeared moist.           Lab Results: Lab Results  Component Value Date   WBC 6.3 09/18/2020   HGB 14.7 09/18/2020   HCT 43.6 09/18/2020   MCV 89.3 09/18/2020   PLT  231 09/18/2020     Chemistry      Component Value Date/Time   NA 143 09/18/2020 0821   K 4.9 09/18/2020 0821   CL 106 09/18/2020 0821   CO2 30 09/18/2020 0821   BUN 12 09/18/2020 0821   CREATININE 1.39 (H) 09/18/2020 0821      Component Value Date/Time   CALCIUM 8.8 (L) 09/18/2020 0821   ALKPHOS 77 09/18/2020 0821   AST 30 09/18/2020 0821   ALT 32 09/18/2020 0821   BILITOT 0.7 09/18/2020  0821    Results for [name], [name] (MRN [id number]) as of 09/20/2020 15:07  Ref. Range 08/30/2020 10:15 09/18/2020 08:21  TSH Latest Ref Range: 0.320 - 4.118 uIU/mL 35.060 (H) 20.349 (H)   IMPRESSION: Mild increase in size of sub-cm right middle lobe pulmonary nodules, largest measuring 5 mm. Other sub-cm pulmonary nodules remain stable.  No evidence of recurrent or metastatic carcinoma within the abdomen or pelvis.  Stable mildly enlarged prostate.  Impression and Plan:  54 year old with:  1.    Clear-cell renal cell carcinoma diagnosed in October 2020.  She developed stage IV disease with pulmonary involvement as well as hilar lymphadenopathy.  He has been on 5 treatment outlined above because of hepatotoxicity predominantly related to axitinib.  CT scan obtained on 09/18/2020 was personally reviewed and discussed today with the patient.  He has a very small subcentimeter noduls noted in his lung that could represent metastatic disease but no gross adenopathy noted.  Treatment options moving forward were reviewed which include continued active surveillance versus instituting single agent immunotherapy with Pembrolizumab or combination with ipilimumab and nivolumab.  Given the very low volume disease and the hepatic toxicity she experienced, I would recommend continued active surveillance at this time and institute therapy if he has rapid progression of his pulmonary nodules.  He is agreeable with this plan.  We will repeat imaging studies in 3 months.   2.  Hypothyroidism: His TSH remains elevated but responding well to higher doses of Synthroid.  We will recheck TSH in 6 weeks.  3.  Hypertension:  Blood pressures back to normal range.  4.  Goals of care and prognosis: His disease is incurable although aggressive measures are warranted given his excellent performance status..   5.  Elevated AST and ALT: No issues reported at this time.  This is related to  axitinib.  6.  Follow-up:  In 6 weeks for repeat labs and in 3 months for repeat follow-up and imaging studies.  30  minutes were spent on this encounter.  The time was dedicated to reviewing imaging studies, discussing treatment options and future plan reviewed.    Zola Button, MD 11/17/20213:33 PM

## 2020-09-21 ENCOUNTER — Telehealth: Payer: Self-pay | Admitting: Oncology

## 2020-09-21 NOTE — Telephone Encounter (Signed)
Scheduled per los, patient has been called and no voicemail was set up. Patient will receive a mailed copy of calender.

## 2020-10-30 DIAGNOSIS — G4733 Obstructive sleep apnea (adult) (pediatric): Secondary | ICD-10-CM | POA: Diagnosis not present

## 2020-10-31 ENCOUNTER — Other Ambulatory Visit: Payer: Self-pay

## 2020-10-31 ENCOUNTER — Inpatient Hospital Stay: Payer: BC Managed Care – PPO | Attending: Oncology

## 2020-10-31 DIAGNOSIS — E039 Hypothyroidism, unspecified: Secondary | ICD-10-CM | POA: Insufficient documentation

## 2020-10-31 DIAGNOSIS — U071 COVID-19: Secondary | ICD-10-CM | POA: Diagnosis not present

## 2020-10-31 DIAGNOSIS — R748 Abnormal levels of other serum enzymes: Secondary | ICD-10-CM | POA: Insufficient documentation

## 2020-10-31 DIAGNOSIS — C641 Malignant neoplasm of right kidney, except renal pelvis: Secondary | ICD-10-CM | POA: Diagnosis not present

## 2020-10-31 DIAGNOSIS — Z79899 Other long term (current) drug therapy: Secondary | ICD-10-CM | POA: Diagnosis not present

## 2020-10-31 DIAGNOSIS — I10 Essential (primary) hypertension: Secondary | ICD-10-CM | POA: Diagnosis not present

## 2020-10-31 DIAGNOSIS — C649 Malignant neoplasm of unspecified kidney, except renal pelvis: Secondary | ICD-10-CM

## 2020-10-31 DIAGNOSIS — R7401 Elevation of levels of liver transaminase levels: Secondary | ICD-10-CM | POA: Insufficient documentation

## 2020-10-31 LAB — CMP (CANCER CENTER ONLY)
ALT: 31 U/L (ref 0–44)
AST: 28 U/L (ref 15–41)
Albumin: 3.9 g/dL (ref 3.5–5.0)
Alkaline Phosphatase: 73 U/L (ref 38–126)
Anion gap: 5 (ref 5–15)
BUN: 16 mg/dL (ref 6–20)
CO2: 30 mmol/L (ref 22–32)
Calcium: 9.2 mg/dL (ref 8.9–10.3)
Chloride: 106 mmol/L (ref 98–111)
Creatinine: 1.29 mg/dL — ABNORMAL HIGH (ref 0.61–1.24)
GFR, Estimated: >60 mL/min (ref >60)
Glucose, Bld: 121 mg/dL — ABNORMAL HIGH (ref 70–99)
Potassium: 4.1 mmol/L (ref 3.5–5.1)
Sodium: 141 mmol/L (ref 135–145)
Total Bilirubin: 0.6 mg/dL (ref 0.3–1.2)
Total Protein: 7.2 g/dL (ref 6.5–8.1)

## 2020-10-31 LAB — CBC WITH DIFFERENTIAL (CANCER CENTER ONLY)
Abs Immature Granulocytes: 0.02 10*3/uL (ref 0.00–0.07)
Basophils Absolute: 0 10*3/uL (ref 0.0–0.1)
Basophils Relative: 1 %
Eosinophils Absolute: 0.2 10*3/uL (ref 0.0–0.5)
Eosinophils Relative: 4 %
HCT: 42.7 % (ref 39.0–52.0)
Hemoglobin: 14.5 g/dL (ref 13.0–17.0)
Immature Granulocytes: 0 %
Lymphocytes Relative: 32 %
Lymphs Abs: 1.9 10*3/uL (ref 0.7–4.0)
MCH: 29.8 pg (ref 26.0–34.0)
MCHC: 34 g/dL (ref 30.0–36.0)
MCV: 87.7 fL (ref 80.0–100.0)
Monocytes Absolute: 0.4 10*3/uL (ref 0.1–1.0)
Monocytes Relative: 7 %
Neutro Abs: 3.3 10*3/uL (ref 1.7–7.7)
Neutrophils Relative %: 56 %
Platelet Count: 245 10*3/uL (ref 150–400)
RBC: 4.87 MIL/uL (ref 4.22–5.81)
RDW: 12.9 % (ref 11.5–15.5)
WBC Count: 6 10*3/uL (ref 4.0–10.5)
nRBC: 0 % (ref 0.0–0.2)

## 2020-10-31 LAB — TSH: TSH: 16.345 u[IU]/mL — ABNORMAL HIGH (ref 0.320–4.118)

## 2020-11-02 DIAGNOSIS — Z20822 Contact with and (suspected) exposure to covid-19: Secondary | ICD-10-CM | POA: Diagnosis not present

## 2020-11-07 ENCOUNTER — Telehealth: Payer: Self-pay | Admitting: Unknown Physician Specialty

## 2020-11-07 NOTE — Telephone Encounter (Signed)
Called to discuss with patient about Covid symptoms and the use of a monoclonal antibody infusion for those with mild to moderate Covid symptoms and at a high risk of hospitalization.   Pt is qualified for the monoclonal antibody infusion, but is asymptomatic at this time and has had 2 recent negative tests.    Gabriel Cirri, NP  11/07/2020 4:04 PM

## 2020-11-10 DIAGNOSIS — Z1152 Encounter for screening for COVID-19: Secondary | ICD-10-CM | POA: Diagnosis not present

## 2020-11-14 DIAGNOSIS — C641 Malignant neoplasm of right kidney, except renal pelvis: Secondary | ICD-10-CM | POA: Diagnosis not present

## 2020-11-14 DIAGNOSIS — Z1339 Encounter for screening examination for other mental health and behavioral disorders: Secondary | ICD-10-CM | POA: Diagnosis not present

## 2020-11-14 DIAGNOSIS — Z1331 Encounter for screening for depression: Secondary | ICD-10-CM | POA: Diagnosis not present

## 2020-12-03 ENCOUNTER — Other Ambulatory Visit: Payer: Self-pay | Admitting: Oncology

## 2020-12-26 ENCOUNTER — Other Ambulatory Visit: Payer: Self-pay

## 2020-12-26 ENCOUNTER — Inpatient Hospital Stay: Payer: BC Managed Care – PPO | Attending: Oncology

## 2020-12-26 ENCOUNTER — Ambulatory Visit (HOSPITAL_COMMUNITY)
Admission: RE | Admit: 2020-12-26 | Discharge: 2020-12-26 | Disposition: A | Discharge status: Home / Self Care | Payer: BC Managed Care – PPO | Source: Ambulatory Visit | Attending: Oncology | Admitting: Oncology

## 2020-12-26 DIAGNOSIS — I10 Essential (primary) hypertension: Secondary | ICD-10-CM | POA: Diagnosis not present

## 2020-12-26 DIAGNOSIS — R918 Other nonspecific abnormal finding of lung field: Secondary | ICD-10-CM | POA: Diagnosis not present

## 2020-12-26 DIAGNOSIS — R7989 Other specified abnormal findings of blood chemistry: Secondary | ICD-10-CM | POA: Insufficient documentation

## 2020-12-26 DIAGNOSIS — K754 Autoimmune hepatitis: Secondary | ICD-10-CM | POA: Insufficient documentation

## 2020-12-26 DIAGNOSIS — Z880 Allergy status to penicillin: Secondary | ICD-10-CM | POA: Insufficient documentation

## 2020-12-26 DIAGNOSIS — E039 Hypothyroidism, unspecified: Secondary | ICD-10-CM | POA: Diagnosis not present

## 2020-12-26 DIAGNOSIS — R600 Localized edema: Secondary | ICD-10-CM | POA: Insufficient documentation

## 2020-12-26 DIAGNOSIS — C649 Malignant neoplasm of unspecified kidney, except renal pelvis: Secondary | ICD-10-CM

## 2020-12-26 DIAGNOSIS — N400 Enlarged prostate without lower urinary tract symptoms: Secondary | ICD-10-CM | POA: Diagnosis not present

## 2020-12-26 DIAGNOSIS — C641 Malignant neoplasm of right kidney, except renal pelvis: Secondary | ICD-10-CM | POA: Insufficient documentation

## 2020-12-26 DIAGNOSIS — Z905 Acquired absence of kidney: Secondary | ICD-10-CM | POA: Diagnosis not present

## 2020-12-26 DIAGNOSIS — C7800 Secondary malignant neoplasm of unspecified lung: Secondary | ICD-10-CM | POA: Diagnosis not present

## 2020-12-26 DIAGNOSIS — Z79899 Other long term (current) drug therapy: Secondary | ICD-10-CM | POA: Diagnosis not present

## 2020-12-26 DIAGNOSIS — Z85528 Personal history of other malignant neoplasm of kidney: Secondary | ICD-10-CM | POA: Diagnosis not present

## 2020-12-26 DIAGNOSIS — R911 Solitary pulmonary nodule: Secondary | ICD-10-CM | POA: Diagnosis not present

## 2020-12-26 LAB — CBC WITH DIFFERENTIAL (CANCER CENTER ONLY)
Abs Immature Granulocytes: 0 10*3/uL (ref 0.00–0.07)
Basophils Absolute: 0 10*3/uL (ref 0.0–0.1)
Basophils Relative: 1 %
Eosinophils Absolute: 0.2 10*3/uL (ref 0.0–0.5)
Eosinophils Relative: 3 %
HCT: 44.8 % (ref 39.0–52.0)
Hemoglobin: 15.1 g/dL (ref 13.0–17.0)
Immature Granulocytes: 0 %
Lymphocytes Relative: 36 %
Lymphs Abs: 2.1 10*3/uL (ref 0.7–4.0)
MCH: 29.7 pg (ref 26.0–34.0)
MCHC: 33.7 g/dL (ref 30.0–36.0)
MCV: 88 fL (ref 80.0–100.0)
Monocytes Absolute: 0.5 10*3/uL (ref 0.1–1.0)
Monocytes Relative: 9 %
Neutro Abs: 2.9 10*3/uL (ref 1.7–7.7)
Neutrophils Relative %: 51 %
Platelet Count: 236 10*3/uL (ref 150–400)
RBC: 5.09 MIL/uL (ref 4.22–5.81)
RDW: 12.6 % (ref 11.5–15.5)
WBC Count: 5.7 10*3/uL (ref 4.0–10.5)
nRBC: 0 % (ref 0.0–0.2)

## 2020-12-26 LAB — CMP (CANCER CENTER ONLY)
ALT: 30 U/L (ref 0–44)
AST: 30 U/L (ref 15–41)
Albumin: 4.3 g/dL (ref 3.5–5.0)
Alkaline Phosphatase: 85 U/L (ref 38–126)
Anion gap: 7 (ref 5–15)
BUN: 15 mg/dL (ref 6–20)
CO2: 27 mmol/L (ref 22–32)
Calcium: 9.1 mg/dL (ref 8.9–10.3)
Chloride: 105 mmol/L (ref 98–111)
Creatinine: 1.4 mg/dL — ABNORMAL HIGH (ref 0.61–1.24)
GFR, Estimated: >60 mL/min (ref >60)
Glucose, Bld: 102 mg/dL — ABNORMAL HIGH (ref 70–99)
Potassium: 4.6 mmol/L (ref 3.5–5.1)
Sodium: 139 mmol/L (ref 135–145)
Total Bilirubin: 0.8 mg/dL (ref 0.3–1.2)
Total Protein: 7.7 g/dL (ref 6.5–8.1)

## 2020-12-26 LAB — TSH: TSH: 10.578 u[IU]/mL — ABNORMAL HIGH (ref 0.320–4.118)

## 2020-12-26 IMAGING — CT CT CHEST W/O CM
2 of 4 series · 14 of 36 positions shown, 17 images · non-contrast
Comparison: Multiple priors including most recent CT chest abdomen
and pelvis [DATE]

CLINICAL DATA: 54-year-old male with kidney cancer history
diagnosed [DATE] status post nephrectomy. History of pulmonary
metastases

EXAM:
CT CHEST, ABDOMEN AND PELVIS WITHOUT CONTRAST
TECHNIQUE: Multidetector CT imaging of the chest, abdomen and pelvis was
performed following the standard protocol without IV contrast.

[Series 2: cap w/o · axial · non-contrast · 0.85mm/px · z∈[-578,-33]mm · 11 of 131 slices shown, 14 images]
[im 11/131  mediastinal]
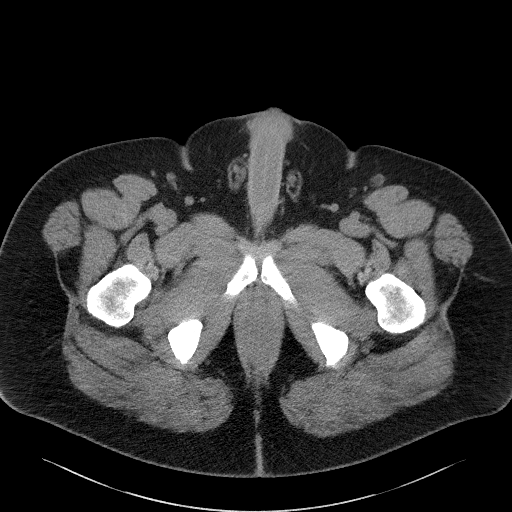
[im 11/131  lung]
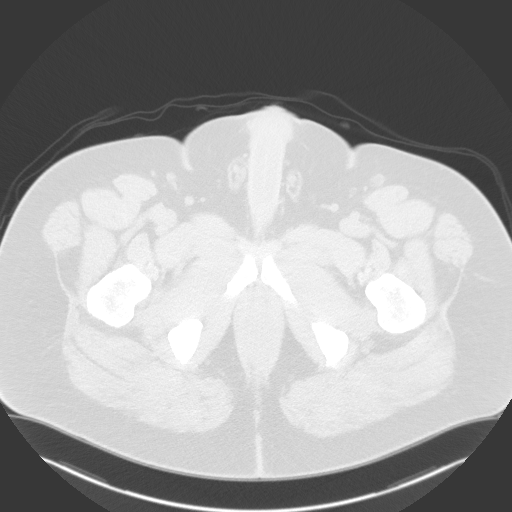
[im 22/131  lung]
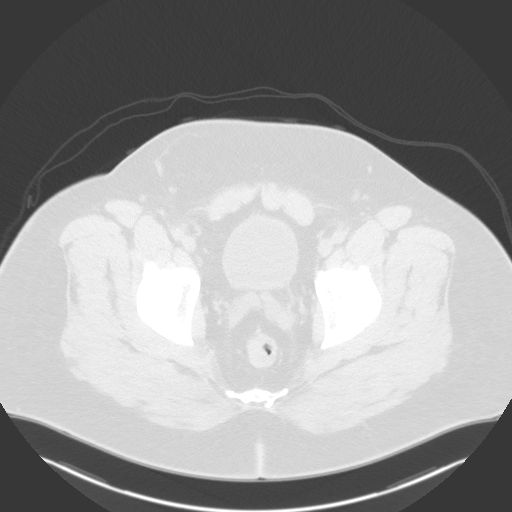
[im 33/131  lung]
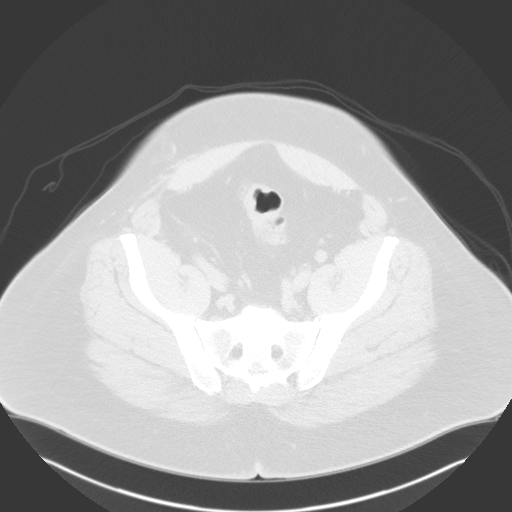
[im 44/131  lung]
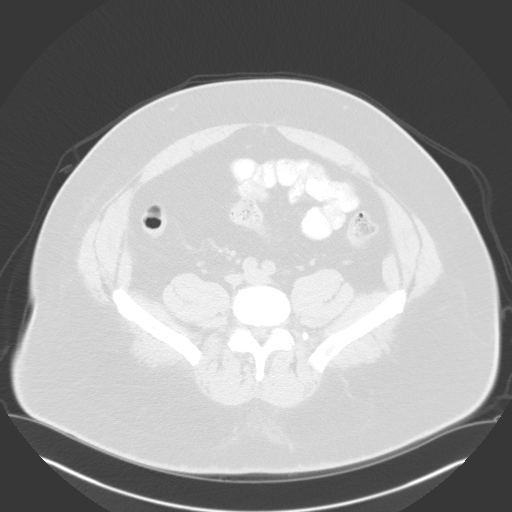
[im 55/131  mediastinal]
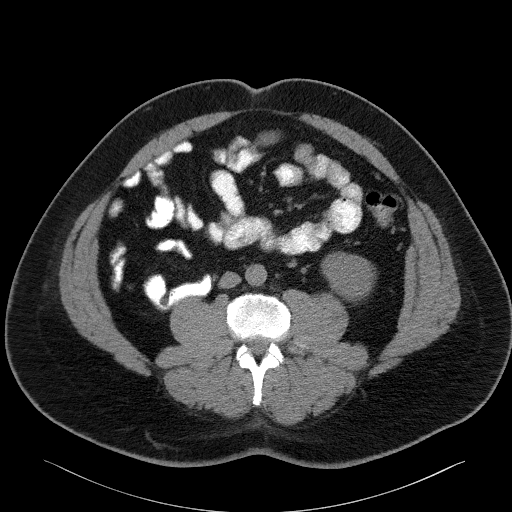
[im 55/131  lung]
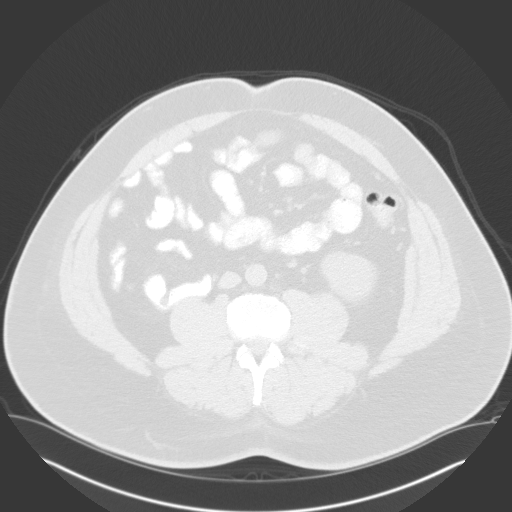
[im 66/131  lung]
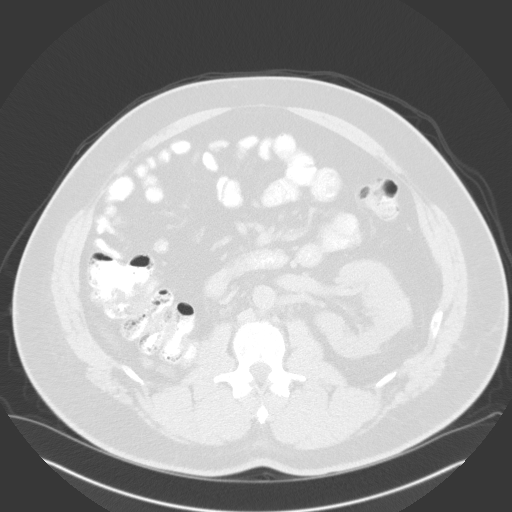
[im 76/131  lung]
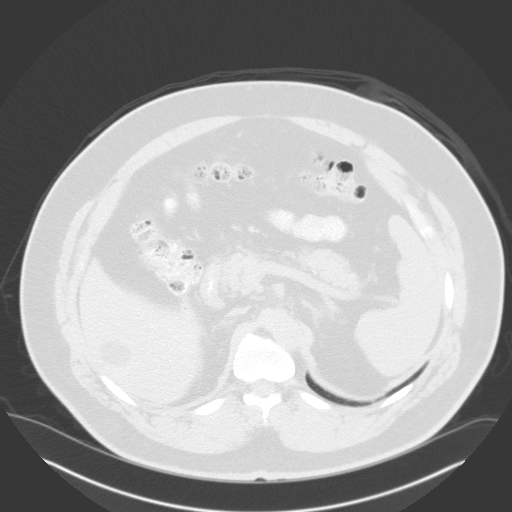
[im 87/131  lung]
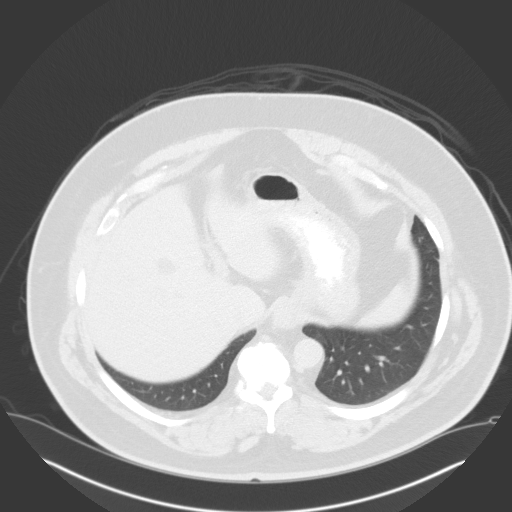
[im 98/131  mediastinal]
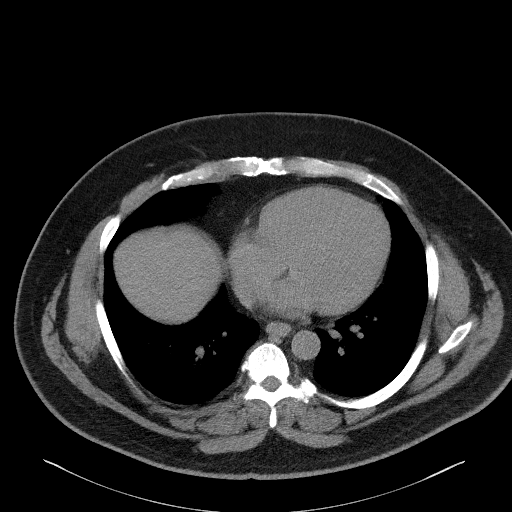
[im 98/131  lung]
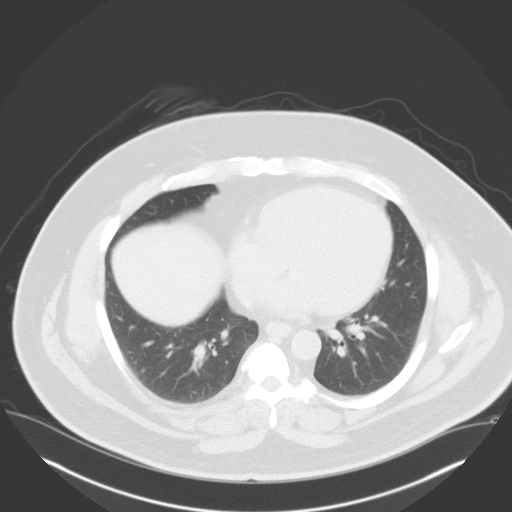
[im 109/131  lung]
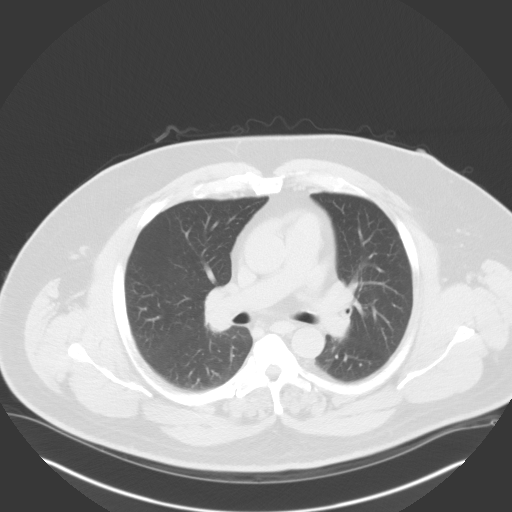
[im 120/131  lung]
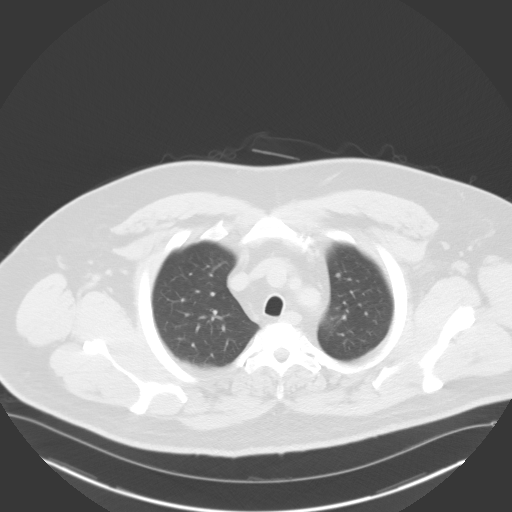

[Series 4: coronals · coronal · 1.10mm/px · 3 of 157 slices shown]
[im 32/157  lung]
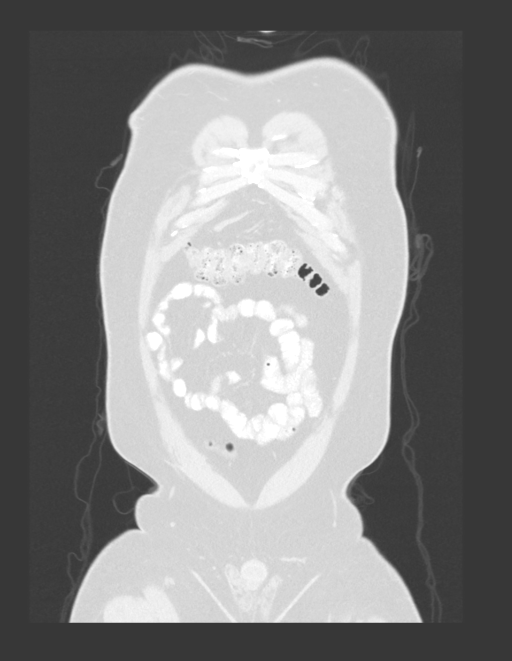
[im 63/157  lung]
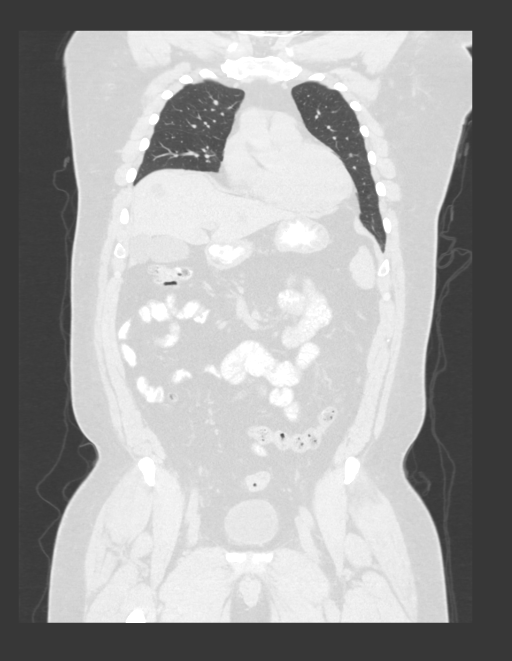
[im 94/157  lung]
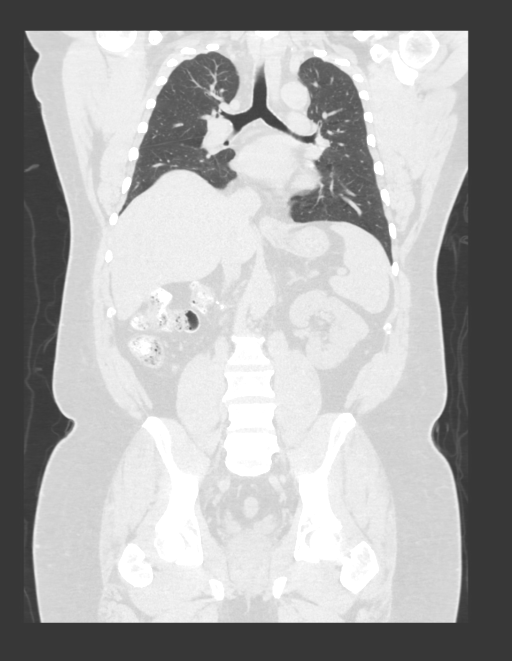

[14 of 36 positions shown; findings below may reference images not displayed]

FINDINGS: CT CHEST FINDINGS

Cardiovascular: No significant vascular findings. Normal heart size.
No pericardial effusion.

Mediastinum/Nodes: No pathologically enlarged mediastinal, hilar, or
axillary lymph nodes visualized on this unenhanced exam. Thyroid
gland, trachea, and esophagus demonstrate no significant findings.

Lungs/Pleura: The majority of the bilateral 5 mm or smaller
pulmonary nodules for instance the pulmonary nodule in the anterior
right middle lobe measures 5 mm on image 6/59, unchanged. The 4 mm
pulmonary nodule in the posterior right middle lobe is also
unchanged in size measuring 4 mm on image 6/56. Finding crease size
of few pulmonary nodules for instance in the anterior aspect of the
right middle lobe now measuring 3 mm previously 2 mm on image 6/65
and a subpleural nodule in the anterior left upper lobe now
measuring 3 mm previously 1-2 mm on image 6/53. No pleural effusion.
No pneumothorax.

Musculoskeletal: No suspicious lytic or blastic lesion of bone.

CT ABDOMEN PELVIS FINDINGS

Hepatobiliary: No significant change in the multiple bilobar hepatic
cysts. No suspicious masses visualized on this unenhanced exam.
Gallbladder is unremarkable. No biliary ductal dilatation.

Pancreas: Unremarkable

Spleen: Unremarkable

Adrenals/Urinary Tract: Prior in right nephrectomy without new
suspicious soft tissue in the nephrectomy bed. Left kidney is
unremarkable in noncontrast appearance. No hydronephrosis. Bladder
is unremarkable.

Stomach/Bowel: No evidence of bowel obstruction, inflammatory
process or abnormal fluid collections. Normal appendix.

Vascular/Lymphatic: No significant vascular findings are present. No
pathologically enlarged abdominal or pelvic lymph nodes.

Reproductive: Mild prostatic enlargement.

Other: No abdominopelvic ascites.

Musculoskeletal: Multilevel degenerative changes spine. No
suspicious lytic or blastic lesion of bone.
IMPRESSION: 1. Slight increase in size of subcentimeter right middle lobe and
left upper lobe pulmonary nodules. The other bilateral subcentimeter
pulmonary nodules remain stable in size.
2. No evidence of local recurrence or metastatic disease in the
abdomen/pelvis.

## 2020-12-28 ENCOUNTER — Inpatient Hospital Stay (HOSPITAL_BASED_OUTPATIENT_CLINIC_OR_DEPARTMENT_OTHER): Payer: BC Managed Care – PPO | Admitting: Oncology

## 2020-12-28 ENCOUNTER — Other Ambulatory Visit: Payer: Self-pay

## 2020-12-28 VITALS — BP 138/92 | HR 64 | Temp 97.0°F | Resp 18 | Ht 66.0 in | Wt 261.3 lb

## 2020-12-28 DIAGNOSIS — E039 Hypothyroidism, unspecified: Secondary | ICD-10-CM | POA: Diagnosis not present

## 2020-12-28 DIAGNOSIS — K754 Autoimmune hepatitis: Secondary | ICD-10-CM | POA: Diagnosis not present

## 2020-12-28 DIAGNOSIS — R918 Other nonspecific abnormal finding of lung field: Secondary | ICD-10-CM | POA: Diagnosis not present

## 2020-12-28 DIAGNOSIS — R600 Localized edema: Secondary | ICD-10-CM | POA: Diagnosis not present

## 2020-12-28 DIAGNOSIS — Z79899 Other long term (current) drug therapy: Secondary | ICD-10-CM | POA: Diagnosis not present

## 2020-12-28 DIAGNOSIS — R7989 Other specified abnormal findings of blood chemistry: Secondary | ICD-10-CM | POA: Diagnosis not present

## 2020-12-28 DIAGNOSIS — Z880 Allergy status to penicillin: Secondary | ICD-10-CM | POA: Diagnosis not present

## 2020-12-28 DIAGNOSIS — Z905 Acquired absence of kidney: Secondary | ICD-10-CM | POA: Diagnosis not present

## 2020-12-28 DIAGNOSIS — C649 Malignant neoplasm of unspecified kidney, except renal pelvis: Secondary | ICD-10-CM

## 2020-12-28 DIAGNOSIS — I10 Essential (primary) hypertension: Secondary | ICD-10-CM | POA: Diagnosis not present

## 2020-12-28 DIAGNOSIS — C641 Malignant neoplasm of right kidney, except renal pelvis: Secondary | ICD-10-CM | POA: Diagnosis not present

## 2020-12-28 NOTE — Progress Notes (Signed)
Hematology and Oncology Follow Up Visit  Michael [name] [id number] 1966-10-15 55 y.o. 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   Prior Therapy: He underwent a right nephrectomy on August 27, 2019 performed while he was living in [place].  The final pathology showed clear cell renal cell carcinoma, nuclear grade 3 with 10% necrosis negative margins.  Final pathological stage was T2ANX.  He is status post Pembrolizumab and axitinib started in March 2021.  He received two cycles of therapy.  Therapy was interrupted because of increased LFTs and autoimmune hepatitis.    He has been receiving his therapy in [place] and relocated to this area.  Axitinib 5 mg daily started on July 05, 2020.  Therapy discontinued on July 13, 2020 because of elevated LFTs.  Current therapy: Active surveillance.  Interim History: Michael [name] returns today for a repeat evaluation.  Since last visit, he reports no major changes in his health.  His amlodipine dose was decreased to 7.5 mg and helped with his lower extremity edema.  He had denies any chest pain shortness of breath.  He denies any hospitalization or illnesses.    Medications: Updated on review. Current Outpatient Medications  Medication Sig Dispense Refill  . amLODipine (NORVASC) 10 MG tablet Take 10 mg by mouth daily.    . cholecalciferol (VITAMIN D3) 25 MCG (1000 UNIT) tablet Take 2,000 Units by mouth daily.    Marland Kitchen levothyroxine (SYNTHROID) 100 MCG tablet TAKE 1 TABLET(100 MCG) BY MOUTH DAILY BEFORE BREAKFAST 90 tablet 0  . montelukast (SINGULAIR) 10 MG tablet Take 10 mg by mouth at bedtime.    . predniSONE (DELTASONE) 5 MG tablet Take 1 tablet (5 mg total) by mouth daily with breakfast. (Patient not taking: Reported on 07/13/2020) 7 tablet 0  . tadalafil (CIALIS) 20 MG tablet Take 20 mg by mouth  daily as needed for erectile dysfunction.    Marland Kitchen zinc gluconate 50 MG tablet Take 50 mg by mouth daily.     No current facility-administered medications for this visit.     Allergies:  Allergies  Allergen Reactions  . Penicillins Other (See Comments)    Childhood allergies- unk      Physical Exam:   Blood pressure (!) 138/92, pulse 64, temperature (!) 97 F (36.1 C), temperature source Tympanic, resp. rate 18, height 5\' 6"  (1.676 m), weight 261 lb 4.8 oz (118.5 kg), SpO2 100 %.     ECOG: 0    General appearance: Alert, awake without any distress. Head: Atraumatic without abnormalities Oropharynx: Without any thrush or ulcers. Eyes: No scleral icterus. Lymph nodes: No lymphadenopathy noted in the cervical, supraclavicular, or axillary nodes Heart:regular rate and rhythm, without any murmurs or gallops.   Lung: Clear to auscultation without any rhonchi, wheezes or dullness to percussion. Abdomin: Soft, nontender without any shifting dullness or ascites. Musculoskeletal: No clubbing or cyanosis. Neurological: No motor or sensory deficits. Skin: No rashes or lesions.           Lab Results: Lab Results  Component Value Date   WBC 5.7 12/26/2020   HGB 15.1 12/26/2020   HCT 44.8 12/26/2020   MCV 88.0 12/26/2020   PLT 236 12/26/2020     Chemistry      Component Value Date/Time   NA 139 12/26/2020 1100   K 4.6 12/26/2020 1100   CL 105 12/26/2020 1100   CO2 27 12/26/2020  1100   BUN 15 12/26/2020 1100   CREATININE 1.40 (H) 12/26/2020 1100      Component Value Date/Time   CALCIUM 9.1 12/26/2020 1100   ALKPHOS 85 12/26/2020 1100   AST 30 12/26/2020 1100   ALT 30 12/26/2020 1100   BILITOT 0.8 12/26/2020 1100       Results for Michael [name], Michael "[name]" (MRN [id number]) as of 12/28/2020 15:49  Ref. Range 12/26/2020 11:01  TSH Latest Ref Range: 0.320 - 4.118 uIU/mL 10.578 (H)   IMPRESSION: 1. Slight increase in size of subcentimeter right middle lobe and left  upper lobe pulmonary nodules. The other bilateral subcentimeter pulmonary nodules remain stable in size. 2. No evidence of local recurrence or metastatic disease in the abdomen/pelvis.   Impression and Plan:  55 year old with:  1.    Stage IV clear-cell renal cell carcinoma diagnosed in October 2020 with pulmonary involvement.  His disease status was updated at this time including review of his imaging studies on February 22.  His CT scan showed very little change in his pulmonary nodules that remain very small at 4 to 5 millimeters.  There were few without increased very slightly but no lymphadenopathy or new metastasis noted.  Treatment options moving forward were discussed.  Continued active surveillance versus restarting Pembrolizumab versus ipilimumab and nivolumab were discussed.  At this time, I recommended continued active surveillance and reinitiate therapy if there is clear-cut progression.  He is agreeable with this plan.   2.  Hypothyroidism: TSH continues to improve on thyroid replacement.  We will continue to check periodically.  3.  Hypertension:  His blood pressure is under control with amlodipine dose has decreased.  4.  Goals of care and prognosis: Therapy remains palliative although aggressive measures are warranted given his young age.   5.  Elevated AST and ALT: Resolved at this time.  This is attributed to axitinib not Pembrolizumab.  6.  Follow-up:  In 6 weeks for laboratory testing and will repeat imaging studies in 3 months.  30  minutes were dedicated to this visit.  The time was spent on reviewing disease status, reviewing laboratory data, imaging studies discussing treatment options and answering questions regarding future plan of care.    Michael Button, MD 2/24/20223:48 PM

## 2021-01-10 ENCOUNTER — Other Ambulatory Visit: Payer: Self-pay | Admitting: Oncology

## 2021-02-01 ENCOUNTER — Other Ambulatory Visit: Payer: Self-pay | Admitting: Oncology

## 2021-02-08 ENCOUNTER — Other Ambulatory Visit: Payer: Self-pay

## 2021-02-08 ENCOUNTER — Inpatient Hospital Stay: Payer: BC Managed Care – PPO | Attending: Oncology

## 2021-02-08 DIAGNOSIS — C641 Malignant neoplasm of right kidney, except renal pelvis: Secondary | ICD-10-CM | POA: Insufficient documentation

## 2021-02-08 DIAGNOSIS — I10 Essential (primary) hypertension: Secondary | ICD-10-CM | POA: Diagnosis not present

## 2021-02-08 DIAGNOSIS — E039 Hypothyroidism, unspecified: Secondary | ICD-10-CM | POA: Diagnosis not present

## 2021-02-08 DIAGNOSIS — R600 Localized edema: Secondary | ICD-10-CM | POA: Insufficient documentation

## 2021-02-08 DIAGNOSIS — R918 Other nonspecific abnormal finding of lung field: Secondary | ICD-10-CM | POA: Insufficient documentation

## 2021-02-08 DIAGNOSIS — Z9221 Personal history of antineoplastic chemotherapy: Secondary | ICD-10-CM | POA: Diagnosis not present

## 2021-02-08 DIAGNOSIS — Z905 Acquired absence of kidney: Secondary | ICD-10-CM | POA: Diagnosis not present

## 2021-02-08 DIAGNOSIS — Z79899 Other long term (current) drug therapy: Secondary | ICD-10-CM | POA: Diagnosis not present

## 2021-02-08 DIAGNOSIS — K754 Autoimmune hepatitis: Secondary | ICD-10-CM | POA: Diagnosis not present

## 2021-02-08 DIAGNOSIS — C649 Malignant neoplasm of unspecified kidney, except renal pelvis: Secondary | ICD-10-CM

## 2021-02-08 LAB — CMP (CANCER CENTER ONLY)
ALT: 26 U/L (ref 0–44)
AST: 27 U/L (ref 15–41)
Albumin: 4.2 g/dL (ref 3.5–5.0)
Alkaline Phosphatase: 99 U/L (ref 38–126)
Anion gap: 12 (ref 5–15)
BUN: 16 mg/dL (ref 6–20)
CO2: 25 mmol/L (ref 22–32)
Calcium: 8.9 mg/dL (ref 8.9–10.3)
Chloride: 106 mmol/L (ref 98–111)
Creatinine: 1.33 mg/dL — ABNORMAL HIGH (ref 0.61–1.24)
GFR, Estimated: >60 mL/min (ref >60)
Glucose, Bld: 96 mg/dL (ref 70–99)
Potassium: 4.6 mmol/L (ref 3.5–5.1)
Sodium: 143 mmol/L (ref 135–145)
Total Bilirubin: 0.7 mg/dL (ref 0.3–1.2)
Total Protein: 7.7 g/dL (ref 6.5–8.1)

## 2021-02-08 LAB — CBC WITH DIFFERENTIAL (CANCER CENTER ONLY)
Abs Immature Granulocytes: 0.01 10*3/uL (ref 0.00–0.07)
Basophils Absolute: 0 10*3/uL (ref 0.0–0.1)
Basophils Relative: 1 %
Eosinophils Absolute: 0.2 10*3/uL (ref 0.0–0.5)
Eosinophils Relative: 3 %
HCT: 44.2 % (ref 39.0–52.0)
Hemoglobin: 15 g/dL (ref 13.0–17.0)
Immature Granulocytes: 0 %
Lymphocytes Relative: 32 %
Lymphs Abs: 2.1 10*3/uL (ref 0.7–4.0)
MCH: 29.9 pg (ref 26.0–34.0)
MCHC: 33.9 g/dL (ref 30.0–36.0)
MCV: 88 fL (ref 80.0–100.0)
Monocytes Absolute: 0.5 10*3/uL (ref 0.1–1.0)
Monocytes Relative: 8 %
Neutro Abs: 3.7 10*3/uL (ref 1.7–7.7)
Neutrophils Relative %: 56 %
Platelet Count: 263 10*3/uL (ref 150–400)
RBC: 5.02 MIL/uL (ref 4.22–5.81)
RDW: 12.9 % (ref 11.5–15.5)
WBC Count: 6.5 10*3/uL (ref 4.0–10.5)
nRBC: 0 % (ref 0.0–0.2)

## 2021-02-08 LAB — TSH: TSH: 19.941 u[IU]/mL — ABNORMAL HIGH (ref 0.320–4.118)

## 2021-03-27 ENCOUNTER — Other Ambulatory Visit: Payer: Self-pay

## 2021-03-27 ENCOUNTER — Inpatient Hospital Stay: Payer: BC Managed Care – PPO | Attending: Oncology

## 2021-03-27 ENCOUNTER — Ambulatory Visit (HOSPITAL_COMMUNITY)
Admission: RE | Admit: 2021-03-27 | Discharge: 2021-03-27 | Disposition: A | Discharge status: Home / Self Care | Payer: BC Managed Care – PPO | Source: Ambulatory Visit | Attending: Oncology | Admitting: Oncology

## 2021-03-27 DIAGNOSIS — R918 Other nonspecific abnormal finding of lung field: Secondary | ICD-10-CM | POA: Insufficient documentation

## 2021-03-27 DIAGNOSIS — C649 Malignant neoplasm of unspecified kidney, except renal pelvis: Secondary | ICD-10-CM

## 2021-03-27 DIAGNOSIS — E039 Hypothyroidism, unspecified: Secondary | ICD-10-CM | POA: Diagnosis not present

## 2021-03-27 DIAGNOSIS — K754 Autoimmune hepatitis: Secondary | ICD-10-CM | POA: Diagnosis not present

## 2021-03-27 DIAGNOSIS — N400 Enlarged prostate without lower urinary tract symptoms: Secondary | ICD-10-CM | POA: Diagnosis not present

## 2021-03-27 DIAGNOSIS — Z880 Allergy status to penicillin: Secondary | ICD-10-CM | POA: Diagnosis not present

## 2021-03-27 DIAGNOSIS — Z79899 Other long term (current) drug therapy: Secondary | ICD-10-CM | POA: Diagnosis not present

## 2021-03-27 DIAGNOSIS — K5730 Diverticulosis of large intestine without perforation or abscess without bleeding: Secondary | ICD-10-CM | POA: Insufficient documentation

## 2021-03-27 DIAGNOSIS — R7989 Other specified abnormal findings of blood chemistry: Secondary | ICD-10-CM | POA: Insufficient documentation

## 2021-03-27 DIAGNOSIS — Z85528 Personal history of other malignant neoplasm of kidney: Secondary | ICD-10-CM | POA: Diagnosis not present

## 2021-03-27 DIAGNOSIS — Z905 Acquired absence of kidney: Secondary | ICD-10-CM | POA: Insufficient documentation

## 2021-03-27 DIAGNOSIS — K5750 Diverticulosis of both small and large intestine without perforation or abscess without bleeding: Secondary | ICD-10-CM | POA: Diagnosis not present

## 2021-03-27 DIAGNOSIS — N2889 Other specified disorders of kidney and ureter: Secondary | ICD-10-CM | POA: Diagnosis not present

## 2021-03-27 DIAGNOSIS — C641 Malignant neoplasm of right kidney, except renal pelvis: Secondary | ICD-10-CM | POA: Insufficient documentation

## 2021-03-27 DIAGNOSIS — Z7952 Long term (current) use of systemic steroids: Secondary | ICD-10-CM | POA: Diagnosis not present

## 2021-03-27 LAB — CMP (CANCER CENTER ONLY)
ALT: 23 U/L (ref 0–44)
AST: 24 U/L (ref 15–41)
Albumin: 4.2 g/dL (ref 3.5–5.0)
Alkaline Phosphatase: 105 U/L (ref 38–126)
Anion gap: 9 (ref 5–15)
BUN: 13 mg/dL (ref 6–20)
CO2: 28 mmol/L (ref 22–32)
Calcium: 9.3 mg/dL (ref 8.9–10.3)
Chloride: 104 mmol/L (ref 98–111)
Creatinine: 1.22 mg/dL (ref 0.61–1.24)
GFR, Estimated: >60 mL/min (ref >60)
Glucose, Bld: 100 mg/dL — ABNORMAL HIGH (ref 70–99)
Potassium: 4.5 mmol/L (ref 3.5–5.1)
Sodium: 141 mmol/L (ref 135–145)
Total Bilirubin: 0.7 mg/dL (ref 0.3–1.2)
Total Protein: 7.7 g/dL (ref 6.5–8.1)

## 2021-03-27 LAB — CBC WITH DIFFERENTIAL (CANCER CENTER ONLY)
Abs Immature Granulocytes: 0.01 10*3/uL (ref 0.00–0.07)
Basophils Absolute: 0 10*3/uL (ref 0.0–0.1)
Basophils Relative: 1 %
Eosinophils Absolute: 0.2 10*3/uL (ref 0.0–0.5)
Eosinophils Relative: 3 %
HCT: 45.7 % (ref 39.0–52.0)
Hemoglobin: 15.3 g/dL (ref 13.0–17.0)
Immature Granulocytes: 0 %
Lymphocytes Relative: 35 %
Lymphs Abs: 2.1 10*3/uL (ref 0.7–4.0)
MCH: 29.1 pg (ref 26.0–34.0)
MCHC: 33.5 g/dL (ref 30.0–36.0)
MCV: 87 fL (ref 80.0–100.0)
Monocytes Absolute: 0.4 10*3/uL (ref 0.1–1.0)
Monocytes Relative: 7 %
Neutro Abs: 3.3 10*3/uL (ref 1.7–7.7)
Neutrophils Relative %: 54 %
Platelet Count: 250 10*3/uL (ref 150–400)
RBC: 5.25 MIL/uL (ref 4.22–5.81)
RDW: 12.7 % (ref 11.5–15.5)
WBC Count: 6 10*3/uL (ref 4.0–10.5)
nRBC: 0 % (ref 0.0–0.2)

## 2021-03-27 LAB — TSH: TSH: 2.622 u[IU]/mL (ref 0.320–4.118)

## 2021-03-27 IMAGING — CT CT ABD-PELV W/O CM
2 of 4 series · 12 of 36 positions shown, 15 images · non-contrast
Comparison: Multiple priors including most recent CT chest abdomen
and pelvis [DATE].

CLINICAL DATA: Renal cancer diagnosed [DATE] status post
right nephrectomy. History of pulmonary metastases.

EXAM:
CT CHEST, ABDOMEN AND PELVIS WITHOUT CONTRAST
TECHNIQUE: Multidetector CT imaging of the chest, abdomen and pelvis was
performed following the standard protocol without IV contrast.

[Series 2: cap w/o · axial · non-contrast · 0.89mm/px · z∈[+933,+1483]mm · 9 of 136 slices shown, 12 images]
[im 13/136  mediastinal]
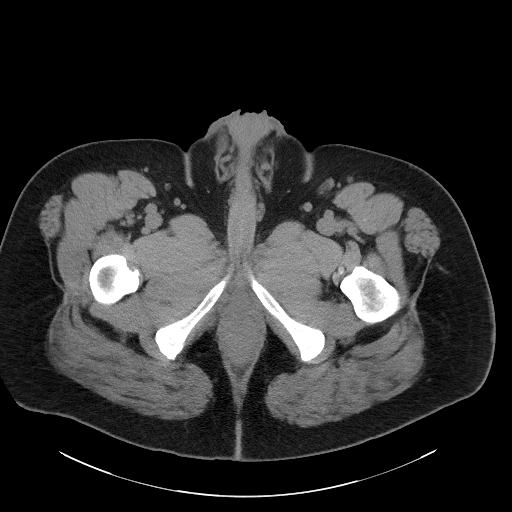
[im 13/136  lung]
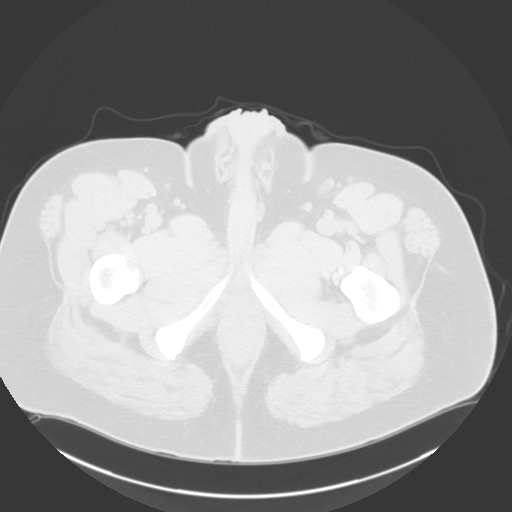
[im 25/136  lung]
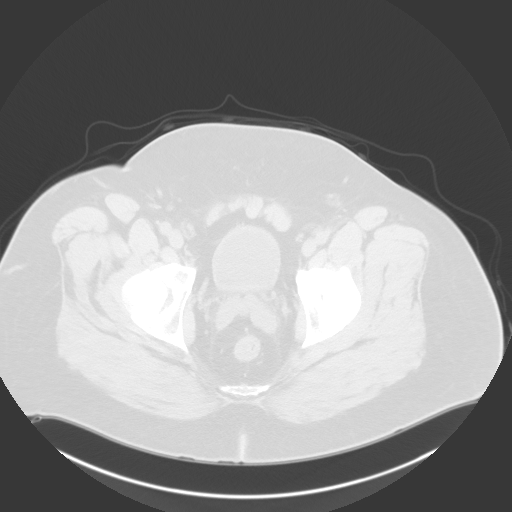
[im 37/136  lung]
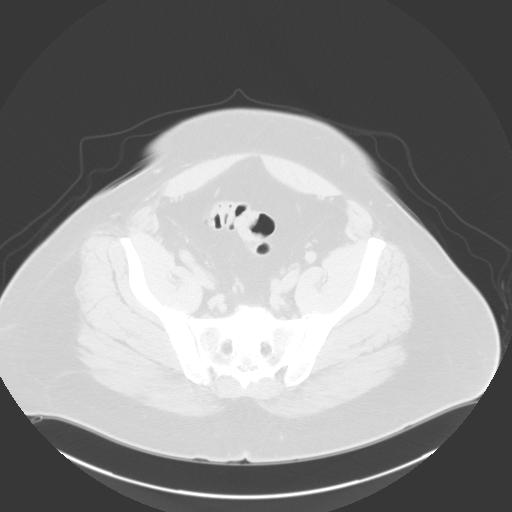
[im 50/136  lung]
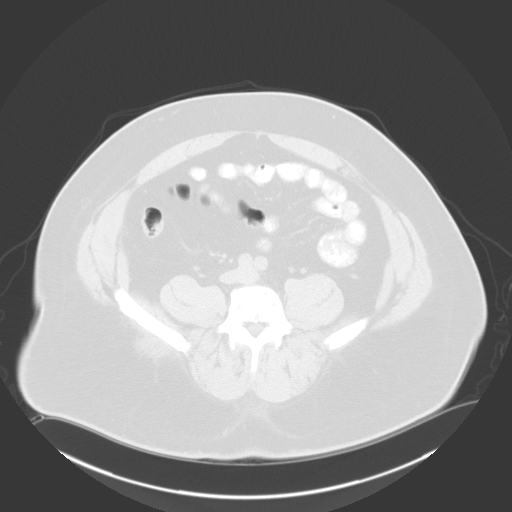
[im 74/136  mediastinal]
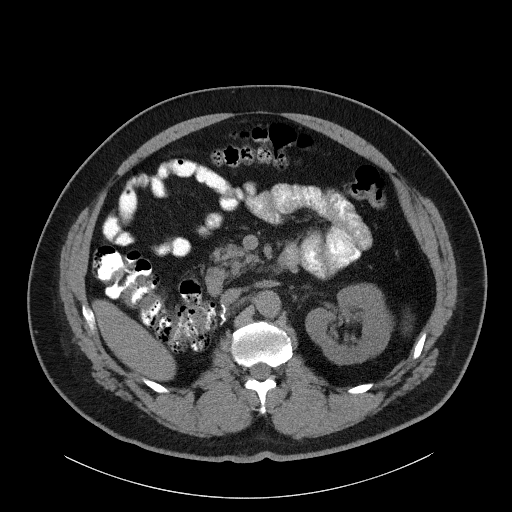
[im 74/136  lung]
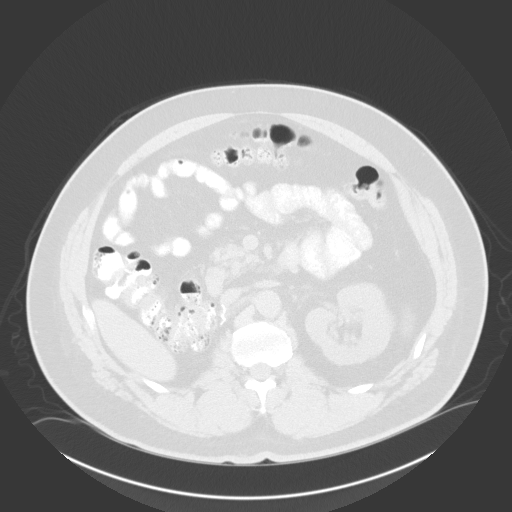
[im 86/136  lung]
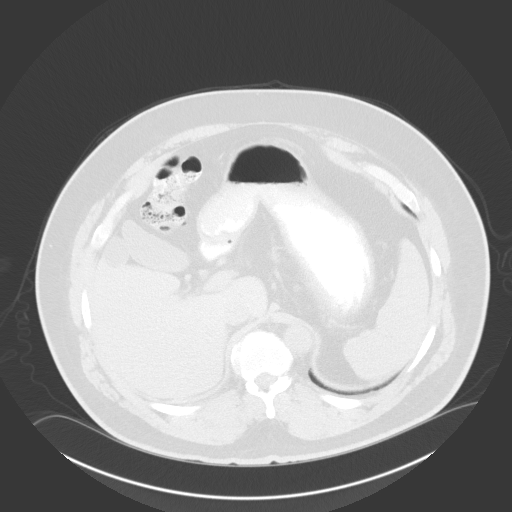
[im 99/136  lung]
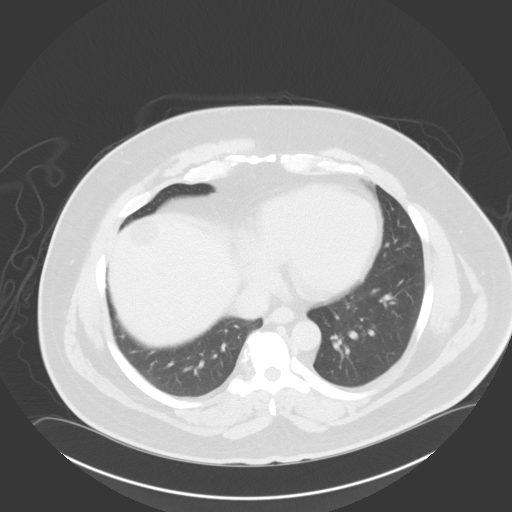
[im 111/136  lung]
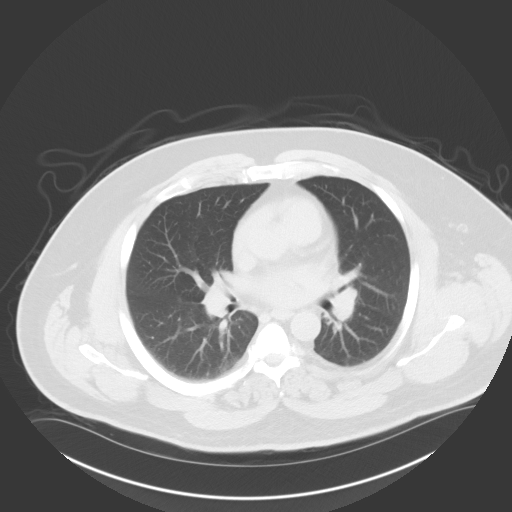
[im 123/136  mediastinal]
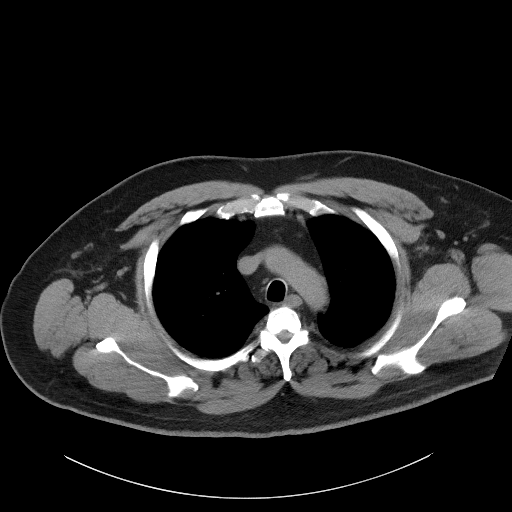
[im 123/136  lung]
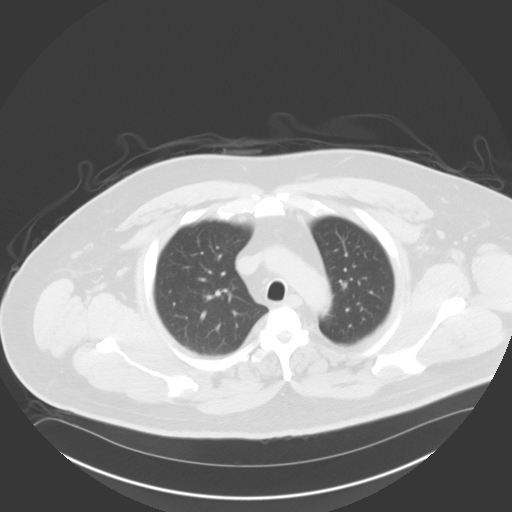

[Series 5: coronals · coronal · 0.82mm/px · 3 of 170 slices shown]
[im 34/170  lung]
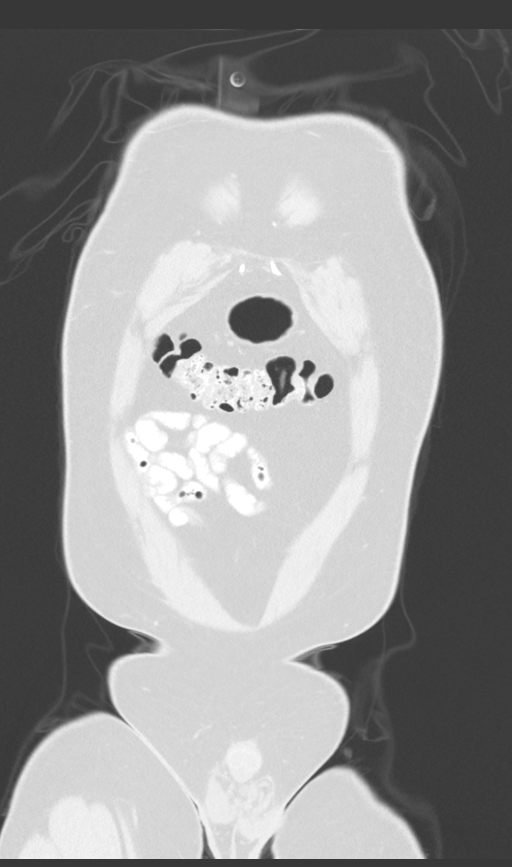
[im 68/170  lung]
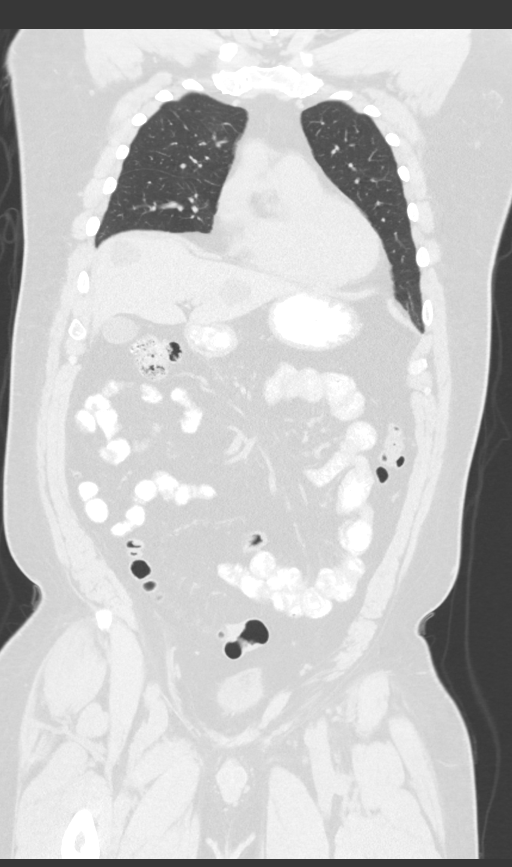
[im 102/170  lung]
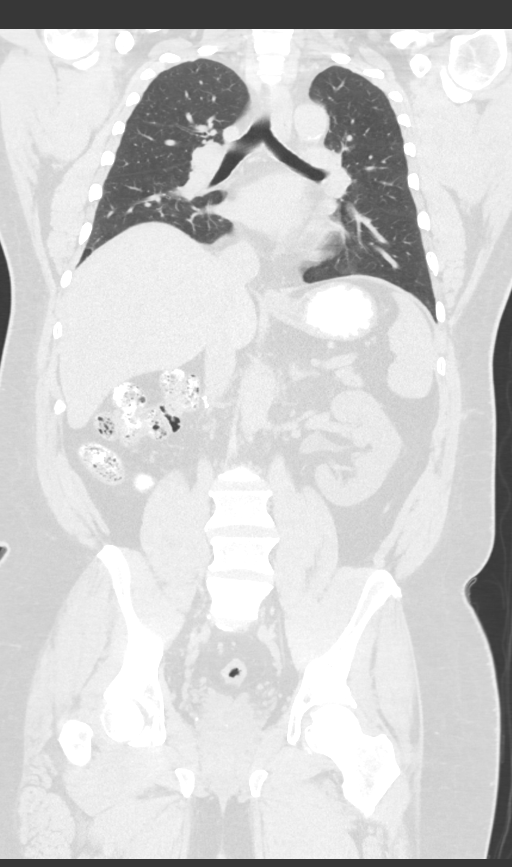

[12 of 36 positions shown; findings below may reference images not displayed]

FINDINGS: CT CHEST FINDINGS

Cardiovascular: No significant vascular findings. Normal heart size.
No pericardial effusion.

Mediastinum/Nodes: No suspicious thyroid nodule aortic. No
pathologically enlarged mediastinal, hilar or axillary lymph nodes
within the limitation of noncontrast examination. The trachea and
esophagus are grossly unremarkable.

Lungs/Pleura: Several tiny 5 mm or smaller pulmonary nodules are
again seen in both lungs which overall are stable to decreased in
size, for instance a 5 mm pulmonary nodule in the anterior right
middle lobe is stable in size on image 44/4, nodule in the anterior
right middle lobe on image 74/4 and measuring 2-3 mm is also stable
in size in the 3 mm subpleural nodule in the anterior left upper
lobe on image 60/4 is also stable. No new or enlarging suspicious
pulmonary nodules or masses visualized. No airspace consolidations.
No pleural effusion. No pneumothorax.

Musculoskeletal: No aggressive lytic or blastic lesion of bone.

CT ABDOMEN PELVIS FINDINGS

Hepatobiliary: No significant change in the bilobar fluid
attenuation hepatic cysts. No suspicious hepatic lesion. Gallbladder
is unremarkable. No biliary ductal dilation.

Pancreas: Within normal limits.

Spleen: Within normal limits.

Adrenals/Urinary Tract: Bilateral adrenal glands are unremarkable.

Prior right nephrectomy without new suspicious soft tissue in the
nephrectomy bed.

Exophytic nodular masslike area extending from the lateral aspect of
the interpolar region of the left kidney measuring approximately
cm on image 119/5, which appears to have been subtly present and
slowly enlarging over multiple prior imaging studies, previously
measuring 1.6 cm on CT [DATE]. The lesion appears
slightly hyperdense to background renal parenchyma but is
incompletely evaluated without intravenous contrast material. No
hydronephrosis.

Stomach/Bowel: Stomach is grossly unremarkable. Enteric contrast
traverses the ascending colon. No pathologic dilation of small
bowel. Normal appendix. Colonic diverticulosis without findings of
acute diverticulitis.

Vascular/Lymphatic: No abdominal aortic aneurysm. No pathologically
enlarged abdominal or pelvic lymph nodes.

Reproductive: Stable mild enlargement the prostate gland.

Other: No abdominopelvic ascites.  No discrete omental nodularity.

Musculoskeletal: No aggressive lytic or blastic lesions of bone.
IMPRESSION: 1. Stable examination status post prior right nephrectomy without
evidence of local recurrence and no evidence of new or enlarging
metastatic disease in the chest abdomen or pelvis.
2. No significant change in the several bilateral 5 mm or smaller
pulmonary nodules. No new or enlarging suspicious pulmonary nodules
or masses visualized.
3. Exophytic nodular mass like area measuring 2.2 cm extending from
the lateral aspect of the interpolar region on the left kidney,
which is slightly hyperdense to adjacent renal parenchyma and in
retrospect was subtly present and slowly increasing in size over
multiple prior imaging studies, but is incompletely evaluated
without intravenous contrast material. While this may represent a
proteinaceous or hemorrhagic cyst, a renal cell carcinoma is also
possible. Further characterization with renal protocol MRI with and
without contrast is recommended.
4. Colonic diverticulosis without findings of acute diverticulitis.

These results will be called to the ordering clinician or
representative by the Radiologist Assistant, and communication
documented in the PACS or [REDACTED].

## 2021-03-29 ENCOUNTER — Inpatient Hospital Stay: Payer: BC Managed Care – PPO | Admitting: Oncology

## 2021-03-30 ENCOUNTER — Other Ambulatory Visit: Payer: Self-pay

## 2021-03-30 ENCOUNTER — Inpatient Hospital Stay (HOSPITAL_BASED_OUTPATIENT_CLINIC_OR_DEPARTMENT_OTHER): Payer: BC Managed Care – PPO | Admitting: Oncology

## 2021-03-30 VITALS — BP 130/86 | HR 61 | Temp 96.8°F | Resp 16 | Wt 252.6 lb

## 2021-03-30 DIAGNOSIS — C641 Malignant neoplasm of right kidney, except renal pelvis: Secondary | ICD-10-CM | POA: Diagnosis not present

## 2021-03-30 DIAGNOSIS — K5730 Diverticulosis of large intestine without perforation or abscess without bleeding: Secondary | ICD-10-CM | POA: Diagnosis not present

## 2021-03-30 DIAGNOSIS — C649 Malignant neoplasm of unspecified kidney, except renal pelvis: Secondary | ICD-10-CM | POA: Diagnosis not present

## 2021-03-30 DIAGNOSIS — Z7952 Long term (current) use of systemic steroids: Secondary | ICD-10-CM | POA: Diagnosis not present

## 2021-03-30 DIAGNOSIS — E039 Hypothyroidism, unspecified: Secondary | ICD-10-CM | POA: Diagnosis not present

## 2021-03-30 DIAGNOSIS — Z905 Acquired absence of kidney: Secondary | ICD-10-CM | POA: Diagnosis not present

## 2021-03-30 DIAGNOSIS — Z880 Allergy status to penicillin: Secondary | ICD-10-CM | POA: Diagnosis not present

## 2021-03-30 DIAGNOSIS — K754 Autoimmune hepatitis: Secondary | ICD-10-CM | POA: Diagnosis not present

## 2021-03-30 DIAGNOSIS — Z79899 Other long term (current) drug therapy: Secondary | ICD-10-CM | POA: Diagnosis not present

## 2021-03-30 DIAGNOSIS — R7989 Other specified abnormal findings of blood chemistry: Secondary | ICD-10-CM | POA: Diagnosis not present

## 2021-03-30 DIAGNOSIS — R918 Other nonspecific abnormal finding of lung field: Secondary | ICD-10-CM | POA: Diagnosis not present

## 2021-03-30 NOTE — Progress Notes (Signed)
Hematology and Oncology Follow Up Visit  Michael [name] [id number] 1966-10-12 55 y.o. 03/30/2021 10:35 AM [name], MDRusso, [name], MD   Principle Diagnosis: 55 year old man with right kidney cancer diagnosed in 2020.  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   Prior Therapy: He underwent a right nephrectomy on August 27, 2019 performed while he was living in [place].  The final pathology showed clear cell renal cell carcinoma, nuclear grade 3 with 10% necrosis negative margins.  Final pathological stage was T2ANX.  He is status post Pembrolizumab and axitinib started in March 2021.  He received two cycles of therapy.  Therapy was interrupted because of increased LFTs and autoimmune hepatitis.    He has been receiving his therapy in [place] and relocated to this area.  Axitinib 5 mg daily started on July 05, 2020.  Therapy discontinued on July 13, 2020 because of elevated LFTs.  Current therapy: Active surveillance.  Interim History: Michael [name] is here for repeat follow-up.  Since the last visit, he reports no major changes in his health.  He denies any abdominal pain nausea or excessive fatigue.  He denies any shortness of breath or difficulty breathing.  His performance status and quality of life remained excellent.    Medications: Unchanged on review. Current Outpatient Medications  Medication Sig Dispense Refill  . amLODipine (NORVASC) 10 MG tablet Take 10 mg by mouth daily.    . cholecalciferol (VITAMIN D3) 25 MCG (1000 UNIT) tablet Take 2,000 Units by mouth daily.    Marland Kitchen levothyroxine (SYNTHROID) 100 MCG tablet TAKE 1 TABLET(100 MCG) BY MOUTH DAILY BEFORE BREAKFAST 90 tablet 0  . montelukast (SINGULAIR) 10 MG tablet Take 10 mg by mouth at bedtime.    . predniSONE (DELTASONE) 5 MG tablet Take 1 tablet (5 mg total) by mouth daily with breakfast. (Patient not taking: Reported on 07/13/2020) 7 tablet 0  . tadalafil (CIALIS) 20 MG tablet Take 20 mg  by mouth daily as needed for erectile dysfunction.    Marland Kitchen zinc gluconate 50 MG tablet Take 50 mg by mouth daily.     No current facility-administered medications for this visit.     Allergies:  Allergies  Allergen Reactions  . Penicillins Other (See Comments)    Childhood allergies- unk      Physical Exam:    Blood pressure 130/86, pulse 61, temperature (!) 96.8 F (36 C), temperature source Tympanic, resp. rate 16, weight 252 lb 9.6 oz (114.6 kg), SpO2 100 %.     ECOG: 0     General appearance: Comfortable appearing without any discomfort Head: Normocephalic without any trauma Oropharynx: Mucous membranes are moist and pink without any thrush or ulcers. Eyes: Pupils are equal and round reactive to light. Lymph nodes: No cervical, supraclavicular, inguinal or axillary lymphadenopathy.   Heart:regular rate and rhythm.  S1 and S2 without leg edema. Lung: Clear without any rhonchi or wheezes.  No dullness to percussion. Abdomin: Soft, nontender, nondistended with good bowel sounds.  No hepatosplenomegaly. Musculoskeletal: No joint deformity or effusion.  Full range of motion noted. Neurological: No deficits noted on motor, sensory and deep tendon reflex exam. Skin: No petechial rash or dryness.  Appeared moist.              Lab Results: Lab Results  Component Value Date   WBC 6.0 03/27/2021   HGB 15.3 03/27/2021   HCT 45.7 03/27/2021   MCV 87.0 03/27/2021   PLT 250 03/27/2021     Chemistry  Component Value Date/Time   NA 141 03/27/2021 1100   K 4.5 03/27/2021 1100   CL 104 03/27/2021 1100   CO2 28 03/27/2021 1100   BUN 13 03/27/2021 1100   CREATININE 1.22 03/27/2021 1100      Component Value Date/Time   CALCIUM 9.3 03/27/2021 1100   ALKPHOS 105 03/27/2021 1100   AST 24 03/27/2021 1100   ALT 23 03/27/2021 1100   BILITOT 0.7 03/27/2021 1100      FINDINGS: CT CHEST FINDINGS  Cardiovascular: No significant vascular findings. Normal  heart size. No pericardial effusion.  Mediastinum/Nodes: No suspicious thyroid nodule aortic. No pathologically enlarged mediastinal, hilar or axillary lymph nodes within the limitation of noncontrast examination. The trachea and esophagus are grossly unremarkable.  Lungs/Pleura: Several tiny 5 mm or smaller pulmonary nodules are again seen in both lungs which overall are stable to decreased in size, for instance a 5 mm pulmonary nodule in the anterior right middle lobe is stable in size on image 44/4, nodule in the anterior right middle lobe on image 74/4 and measuring 2-3 mm is also stable in size in the 3 mm subpleural nodule in the anterior left upper lobe on image 60/4 is also stable. No new or enlarging suspicious pulmonary nodules or masses visualized. No airspace consolidations. No pleural effusion. No pneumothorax.  Musculoskeletal: No aggressive lytic or blastic lesion of bone.  CT ABDOMEN PELVIS FINDINGS  Hepatobiliary: No significant change in the bilobar fluid attenuation hepatic cysts. No suspicious hepatic lesion. Gallbladder is unremarkable. No biliary ductal dilation.  Pancreas: Within normal limits.  Spleen: Within normal limits.  Adrenals/Urinary Tract: Bilateral adrenal glands are unremarkable.  Prior right nephrectomy without new suspicious soft tissue in the nephrectomy bed.  Exophytic nodular masslike area extending from the lateral aspect of the interpolar region of the left kidney measuring approximately 2.2 cm on image 119/5, which appears to have been subtly present and slowly enlarging over multiple prior imaging studies, previously measuring 1.6 cm on CT December 26, 2020. The lesion appears slightly hyperdense to background renal parenchyma but is incompletely evaluated without intravenous contrast material. No hydronephrosis.  Stomach/Bowel: Stomach is grossly unremarkable. Enteric contrast traverses the ascending colon. No  pathologic dilation of small bowel. Normal appendix. Colonic diverticulosis without findings of acute diverticulitis.  Vascular/Lymphatic: No abdominal aortic aneurysm. No pathologically enlarged abdominal or pelvic lymph nodes.  Reproductive: Stable mild enlargement the prostate gland.  Other: No abdominopelvic ascites.  No discrete omental nodularity.  Musculoskeletal: No aggressive lytic or blastic lesions of bone.  IMPRESSION: 1. Stable examination status post prior right nephrectomy without evidence of local recurrence and no evidence of new or enlarging metastatic disease in the chest abdomen or pelvis. 2. No significant change in the several bilateral 5 mm or smaller pulmonary nodules. No new or enlarging suspicious pulmonary nodules or masses visualized. 3. Exophytic nodular mass like area measuring 2.2 cm extending from the lateral aspect of the interpolar region on the left kidney, which is slightly hyperdense to adjacent renal parenchyma and in retrospect was subtly present and slowly increasing in size over multiple prior imaging studies, but is incompletely evaluated without intravenous contrast material. While this may represent a proteinaceous or hemorrhagic cyst, a renal cell carcinoma is also possible. Further characterization with renal protocol MRI with and without contrast is recommended. 4. Colonic diverticulosis without findings of acute diverticulitis.  These results will be called to the ordering clinician or representative by the Radiologist Assistant, and communication documented in the PACS  or [employer].      Impression and Plan:  54 year old with:  1.    Right kidney cancer diagnosed in 2020.  He developed stage IV clear-cell with pulmonary involvement.  He continues to be on active surveillance after achieving a reasonable response to therapy outlined above.  CT scan on Mar 27, 2021 was personally reviewed and continues to show  stable disease.  His pulmonary nodules continue to be very minimal and not changing at this time.  For the time being I recommended continued active surveillance.  Reinstituting therapy with Pembrolizumab or oral targeted therapy with cabozantinib could be a possibility in the future.   2.  Hypothyroidism: TSH has normalized at this time with thyroid replacement.  3.    Left kidney mass: This is measuring at 2.2 cm with hyperdense component associated with it.  This could represent a new kidney neoplasm.  I will refer him to urology for evaluation for possible further assessment.  Partial nephrectomy versus cryoablation could be an option for him.  We will obtain an MRI to evaluate prior to referrals.  4.  Goals of care and prognosis: His disease is incurable although aggressive measures are warranted given his reasonable performance status.   5.  Elevated AST and ALT: Likely related to axitinib and normalized at this time.  6.  Follow-up:  Repeat evaluation in 6 weeks for laboratory testing and MD follow-up in 3 months.  30  minutes were spent on this encounter.  The time was dedicated to reviewing laboratory data, disease status update and outlining future plan of care options.    Zola Button, MD 5/27/202210:35 AM

## 2021-04-04 ENCOUNTER — Encounter: Payer: Self-pay | Admitting: *Deleted

## 2021-04-04 ENCOUNTER — Other Ambulatory Visit: Payer: Self-pay | Admitting: Oncology

## 2021-04-04 ENCOUNTER — Ambulatory Visit (HOSPITAL_COMMUNITY): Payer: BC Managed Care – PPO

## 2021-04-04 ENCOUNTER — Ambulatory Visit
Admission: RE | Admit: 2021-04-04 | Discharge: 2021-04-04 | Disposition: A | Discharge status: Home / Self Care | Payer: BC Managed Care – PPO | Source: Ambulatory Visit | Attending: Oncology | Admitting: Oncology

## 2021-04-04 ENCOUNTER — Other Ambulatory Visit: Payer: Self-pay | Admitting: Interventional Radiology

## 2021-04-04 DIAGNOSIS — C649 Malignant neoplasm of unspecified kidney, except renal pelvis: Secondary | ICD-10-CM

## 2021-04-04 HISTORY — PX: IR RADIOLOGIST EVAL & MGMT: IMG5224

## 2021-04-04 NOTE — Consult Note (Signed)
Chief Complaint: Patient was seen in consultation today for No chief complaint on file.  at the request of Shadad,Firas N  Referring Physician(s): EVOJJK,KXFGH N  History of Present Illness: Michael [name] is a 55 y.o. male with history of stage IV right clear cell renal carcinoma with pulmonary metastases, status post right nephrectomy.  Recent follow-up CT shows enlarging left renal mass, suspicious for recurrent malignancy.  He presents to interventional radiology clinic today for discussion of ablation options for treatment of this mass.   He feels well.  He denies flank pain, hematuria, shortness of breath, fatigue or unintentional weight loss.    Past Medical History:  Diagnosis Date  . Cancer ([place])   . Renal disorder     Allergies: Penicillins  Medications: Prior to Admission medications   Medication Sig Start Date End Date Taking? Authorizing Provider  amLODipine (NORVASC) 10 MG tablet Take 10 mg by mouth daily.    [provider]  cholecalciferol (VITAMIN D3) 25 MCG (1000 UNIT) tablet Take 2,000 Units by mouth daily.    [provider]  levothyroxine (SYNTHROID) 100 MCG tablet TAKE 1 TABLET(100 MCG) BY MOUTH DAILY BEFORE BREAKFAST 02/01/21   Wyatt Portela, MD  montelukast (SINGULAIR) 10 MG tablet Take 10 mg by mouth at bedtime.    [provider]  predniSONE (DELTASONE) 5 MG tablet Take 1 tablet (5 mg total) by mouth daily with breakfast. Patient not taking: Reported on 07/13/2020 06/01/20   Wyatt Portela, MD  tadalafil (CIALIS) 20 MG tablet Take 20 mg by mouth daily as needed for erectile dysfunction.    [provider]  zinc gluconate 50 MG tablet Take 50 mg by mouth daily.    [provider]     No family history on file.  Social History   Socioeconomic History  . Marital status: Married    Spouse name: Not on file  . Number of children: Not on file  . Years of education: Not on file  . Highest education  level: Not on file  Occupational History  . Not on file  Tobacco Use  . Smoking status: Not on file  . Smokeless tobacco: Not on file  Substance and Sexual Activity  . Alcohol use: Not on file  . Drug use: Not on file  . Sexual activity: Not on file  Other Topics Concern  . Not on file  Social History Narrative  . Not on file   Social Determinants of Health   Financial Resource Strain: Not on file  Food Insecurity: Not on file  Transportation Needs: Not on file  Physical Activity: Not on file  Stress: Not on file  Social Connections: Not on file   Review of Systems: A 12 point ROS discussed and pertinent positives are indicated in the HPI above.  All other systems are negative.  Review of Systems  Vital Signs: BP 128/88 (BP Location: Left Arm)   Pulse (!) 57   SpO2 98%   Physical Exam Constitutional:      General: He is not in acute distress.    Appearance: Normal appearance.  Cardiovascular:     Rate and Rhythm: Normal rate and regular rhythm.     Heart sounds: Normal heart sounds.  Pulmonary:     Effort: Pulmonary effort is normal.     Breath sounds: Normal breath sounds.  Abdominal:     Palpations: Abdomen is soft.  Skin:    General: Skin is warm and dry.  Neurological:     Mental Status: He is oriented to person, place, and time.  Psychiatric:        Mood and Affect: Mood normal.    Imaging: CT ABDOMEN PELVIS WO CONTRAST  Result Date: 03/28/2021 CLINICAL DATA:  Renal cancer diagnosed October 2020 status post right nephrectomy. History of pulmonary metastases. EXAM: CT CHEST, ABDOMEN AND PELVIS WITHOUT CONTRAST TECHNIQUE: Multidetector CT imaging of the chest, abdomen and pelvis was performed following the standard protocol without IV contrast. COMPARISON:  Multiple priors including most recent CT chest abdomen and pelvis December 26, 2020. FINDINGS: CT CHEST FINDINGS Cardiovascular: No significant vascular findings. Normal heart size. No pericardial  effusion. Mediastinum/Nodes: No suspicious thyroid nodule aortic. No pathologically enlarged mediastinal, hilar or axillary lymph nodes within the limitation of noncontrast examination. The trachea and esophagus are grossly unremarkable. Lungs/Pleura: Several tiny 5 mm or smaller pulmonary nodules are again seen in both lungs which overall are stable to decreased in size, for instance a 5 mm pulmonary nodule in the anterior right middle lobe is stable in size on image 44/4, nodule in the anterior right middle lobe on image 74/4 and measuring 2-3 mm is also stable in size in the 3 mm subpleural nodule in the anterior left upper lobe on image 60/4 is also stable. No new or enlarging suspicious pulmonary nodules or masses visualized. No airspace consolidations. No pleural effusion. No pneumothorax. Musculoskeletal: No aggressive lytic or blastic lesion of bone. CT ABDOMEN PELVIS FINDINGS Hepatobiliary: No significant change in the bilobar fluid attenuation hepatic cysts. No suspicious hepatic lesion. Gallbladder is unremarkable. No biliary ductal dilation. Pancreas: Within normal limits. Spleen: Within normal limits. Adrenals/Urinary Tract: Bilateral adrenal glands are unremarkable. Prior right nephrectomy without new suspicious soft tissue in the nephrectomy bed. Exophytic nodular masslike area extending from the lateral aspect of the interpolar region of the left kidney measuring approximately 2.2 cm on image 119/5, which appears to have been subtly present and slowly enlarging over multiple prior imaging studies, previously measuring 1.6 cm on CT December 26, 2020. The lesion appears slightly hyperdense to background renal parenchyma but is incompletely evaluated without intravenous contrast material. No hydronephrosis. Stomach/Bowel: Stomach is grossly unremarkable. Enteric contrast traverses the ascending colon. No pathologic dilation of small bowel. Normal appendix. Colonic diverticulosis without findings of  acute diverticulitis. Vascular/Lymphatic: No abdominal aortic aneurysm. No pathologically enlarged abdominal or pelvic lymph nodes. Reproductive: Stable mild enlargement the prostate gland. Other: No abdominopelvic ascites.  No discrete omental nodularity. Musculoskeletal: No aggressive lytic or blastic lesions of bone. IMPRESSION: 1. Stable examination status post prior right nephrectomy without evidence of local recurrence and no evidence of new or enlarging metastatic disease in the chest abdomen or pelvis. 2. No significant change in the several bilateral 5 mm or smaller pulmonary nodules. No new or enlarging suspicious pulmonary nodules or masses visualized. 3. Exophytic nodular mass like area measuring 2.2 cm extending from the lateral aspect of the interpolar region on the left kidney, which is slightly hyperdense to adjacent renal parenchyma and in retrospect was subtly present and slowly increasing in size over multiple prior imaging studies, but is incompletely evaluated without intravenous contrast material. While this may represent a proteinaceous or hemorrhagic cyst, a renal cell carcinoma is also possible. Further characterization with renal protocol MRI with and without contrast is recommended. 4. Colonic diverticulosis without findings of acute diverticulitis. These results will be called to the ordering clinician or representative by the Radiologist Assistant, and communication documented in the PACS  or [employer]. Electronically Signed   By: Dahlia Bailiff MD   On: 03/28/2021 15:30   CT Chest Wo Contrast  Result Date: 03/28/2021 CLINICAL DATA:  Renal cancer diagnosed October 2020 status post right nephrectomy. History of pulmonary metastases. EXAM: CT CHEST, ABDOMEN AND PELVIS WITHOUT CONTRAST TECHNIQUE: Multidetector CT imaging of the chest, abdomen and pelvis was performed following the standard protocol without IV contrast. COMPARISON:  Multiple priors including most recent CT chest  abdomen and pelvis December 26, 2020. FINDINGS: CT CHEST FINDINGS Cardiovascular: No significant vascular findings. Normal heart size. No pericardial effusion. Mediastinum/Nodes: No suspicious thyroid nodule aortic. No pathologically enlarged mediastinal, hilar or axillary lymph nodes within the limitation of noncontrast examination. The trachea and esophagus are grossly unremarkable. Lungs/Pleura: Several tiny 5 mm or smaller pulmonary nodules are again seen in both lungs which overall are stable to decreased in size, for instance a 5 mm pulmonary nodule in the anterior right middle lobe is stable in size on image 44/4, nodule in the anterior right middle lobe on image 74/4 and measuring 2-3 mm is also stable in size in the 3 mm subpleural nodule in the anterior left upper lobe on image 60/4 is also stable. No new or enlarging suspicious pulmonary nodules or masses visualized. No airspace consolidations. No pleural effusion. No pneumothorax. Musculoskeletal: No aggressive lytic or blastic lesion of bone. CT ABDOMEN PELVIS FINDINGS Hepatobiliary: No significant change in the bilobar fluid attenuation hepatic cysts. No suspicious hepatic lesion. Gallbladder is unremarkable. No biliary ductal dilation. Pancreas: Within normal limits. Spleen: Within normal limits. Adrenals/Urinary Tract: Bilateral adrenal glands are unremarkable. Prior right nephrectomy without new suspicious soft tissue in the nephrectomy bed. Exophytic nodular masslike area extending from the lateral aspect of the interpolar region of the left kidney measuring approximately 2.2 cm on image 119/5, which appears to have been subtly present and slowly enlarging over multiple prior imaging studies, previously measuring 1.6 cm on CT December 26, 2020. The lesion appears slightly hyperdense to background renal parenchyma but is incompletely evaluated without intravenous contrast material. No hydronephrosis. Stomach/Bowel: Stomach is grossly unremarkable.  Enteric contrast traverses the ascending colon. No pathologic dilation of small bowel. Normal appendix. Colonic diverticulosis without findings of acute diverticulitis. Vascular/Lymphatic: No abdominal aortic aneurysm. No pathologically enlarged abdominal or pelvic lymph nodes. Reproductive: Stable mild enlargement the prostate gland. Other: No abdominopelvic ascites.  No discrete omental nodularity. Musculoskeletal: No aggressive lytic or blastic lesions of bone. IMPRESSION: 1. Stable examination status post prior right nephrectomy without evidence of local recurrence and no evidence of new or enlarging metastatic disease in the chest abdomen or pelvis. 2. No significant change in the several bilateral 5 mm or smaller pulmonary nodules. No new or enlarging suspicious pulmonary nodules or masses visualized. 3. Exophytic nodular mass like area measuring 2.2 cm extending from the lateral aspect of the interpolar region on the left kidney, which is slightly hyperdense to adjacent renal parenchyma and in retrospect was subtly present and slowly increasing in size over multiple prior imaging studies, but is incompletely evaluated without intravenous contrast material. While this may represent a proteinaceous or hemorrhagic cyst, a renal cell carcinoma is also possible. Further characterization with renal protocol MRI with and without contrast is recommended. 4. Colonic diverticulosis without findings of acute diverticulitis. These results will be called to the ordering clinician or representative by the Radiologist Assistant, and communication documented in the PACS or [employer]. Electronically Signed   By: Dahlia Bailiff MD   On:  03/28/2021 15:30    Labs:  CBC: Recent Labs    10/31/20 1111 12/26/20 1100 02/08/21 1028 03/27/21 1100  WBC 6.0 5.7 6.5 6.0  HGB 14.5 15.1 15.0 15.3  HCT 42.7 44.8 44.2 45.7  PLT 245 236 263 250    COAGS: No results for input(s): INR, APTT in the last 8760  hours.  BMP: Recent Labs    06/12/20 0835 07/13/20 0115 07/24/20 0855 08/01/20 1317 08/16/20 1010 10/31/20 1111 12/26/20 1100 02/08/21 1028 03/27/21 1100  NA 141 133* 139 141   < > 141 139 143 141  K 4.3 3.6 4.9 5.1   < > 4.1 4.6 4.6 4.5  CL 106 101 105 102   < > 106 105 106 104  CO2 28 23 26 30    < > 30 27 25 28   GLUCOSE 94 119* 97 85   < > 121* 102* 96 100*  BUN 14 17 15 15    < > 16 15 16 13   CALCIUM 9.4 8.9 8.9 9.6   < > 9.2 9.1 8.9 9.3  CREATININE 1.49* 1.61* 1.45* 1.51*   < > 1.29* 1.40* 1.33* 1.22  GFRNONAA 53* 48* 55* 52*   < > >60 >60 >60 >60  GFRAA >60 56* >60 >60  --   --   --   --   --    < > = values in this interval not displayed.    LIVER FUNCTION TESTS: Recent Labs    10/31/20 1111 12/26/20 1100 02/08/21 1028 03/27/21 1100  BILITOT 0.6 0.8 0.7 0.7  AST 28 30 27 24   ALT 31 30 26 23   ALKPHOS 73 85 99 105  PROT 7.2 7.7 7.7 7.7  ALBUMIN 3.9 4.3 4.2 4.2    TUMOR MARKERS: No results for input(s): AFPTM, CEA, CA199, CHROMGRNA in the last 8760 hours.  Assessment and Plan:  55 year old gentleman with prior history of stage IV renal cell carcinoma with pulmonary metastases, status post right nephrectomy presents today measure radiology for discussion for treatment options for enlarging left renal mass.   His new left renal mass has only been evaluated thus far on noncontrast CT.  First step will be to obtain a contrast enhanced MRI of his abdomen and determine the size and location of the entire left renal mass.  Once we determine the size and shape of the lesion on the MRI, I will be better able to determine if the patient is a candidate for microwave or cryoablation.   I discussed the risks of ablation including but not limited to bleeding, infection, incomplete ablation, damage to adjacent structures such as the renal collecting system and bowel.  I discussed with him that there is an increased risk of bleeding with larger tumors which can require  embolization or even nephrectomy.  He understood the risks and benefits of ablation.  We will obtain MRI of the abdomen and follow-up with Mr. Nance regarding the treatment options.  We will also obtain his records from [place] where he had his right nephrectomy performed.    Thank you for this interesting consult.  I greatly enjoyed meeting Donyea Beverlin and look forward to participating in their care.  A copy of this report was sent to the requesting provider on this date.  Electronically Signed: Paula Libra Kelin Borum 04/04/2021, 12:13 PM   I spent a total of 40 Minutes  in face to face in clinical consultation, greater than 50% of which was counseling/coordinating care for left renal mass.

## 2021-04-05 ENCOUNTER — Other Ambulatory Visit (HOSPITAL_COMMUNITY): Payer: Self-pay | Admitting: Oncology

## 2021-04-05 DIAGNOSIS — C649 Malignant neoplasm of unspecified kidney, except renal pelvis: Secondary | ICD-10-CM

## 2021-04-16 ENCOUNTER — Ambulatory Visit (HOSPITAL_COMMUNITY)
Admission: RE | Admit: 2021-04-16 | Discharge: 2021-04-16 | Disposition: A | Discharge status: Home / Self Care | Payer: BC Managed Care – PPO | Source: Ambulatory Visit | Attending: Interventional Radiology | Admitting: Interventional Radiology

## 2021-04-16 ENCOUNTER — Encounter (HOSPITAL_COMMUNITY): Payer: Self-pay

## 2021-04-16 ENCOUNTER — Other Ambulatory Visit: Payer: Self-pay

## 2021-04-16 ENCOUNTER — Ambulatory Visit (HOSPITAL_COMMUNITY): Payer: BC Managed Care – PPO

## 2021-04-16 DIAGNOSIS — R1900 Intra-abdominal and pelvic swelling, mass and lump, unspecified site: Secondary | ICD-10-CM | POA: Diagnosis not present

## 2021-04-16 DIAGNOSIS — C649 Malignant neoplasm of unspecified kidney, except renal pelvis: Secondary | ICD-10-CM | POA: Diagnosis not present

## 2021-04-16 DIAGNOSIS — K7689 Other specified diseases of liver: Secondary | ICD-10-CM | POA: Diagnosis not present

## 2021-04-16 DIAGNOSIS — N2889 Other specified disorders of kidney and ureter: Secondary | ICD-10-CM | POA: Diagnosis not present

## 2021-04-16 IMAGING — MR MR ABDOMEN WO/W CM
19 series · 47 of 48 positions shown · IV contrast (10 GADAVIST)
Comparison: Abdomen and pelvis CT [DATE]

CLINICAL DATA: Renal cancer status post right nephrectomy.

EXAM:
MRI ABDOMEN WITHOUT AND WITH CONTRAST
TECHNIQUE: Multiplanar multisequence MR imaging of the abdomen was performed
both before and after the administration of intravenous contrast.
CONTRAST:  10mL GADAVIST GADOBUTROL 1 MMOL/ML IV SOLN

[Series 4: T2 · coronal · 6.0mm · 1.64mm/px · 2 of 39 slices shown (1 of 2)]
[im 1/39]
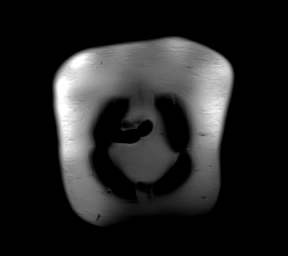
[im 39/39]
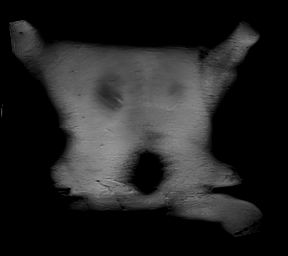

[Series 5: T2 fat-sat · axial · 6.0mm · 1.25mm/px · z∈[-110,+163]mm · 2 of 39 slices shown]
[im 1/39]
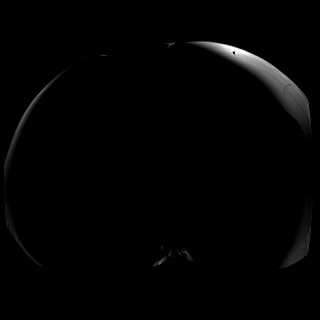
[im 39/39]
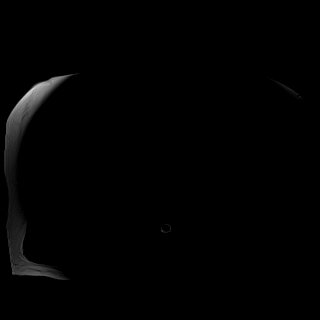

[Series 7: DWI · axial · 6.0mm · 1.49mm/px · z∈[-124,+171]mm · 3 of 84 slices shown (1 of 2)]
[im 1/84]
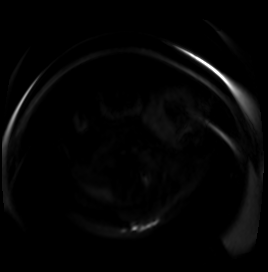
[im 42/84]
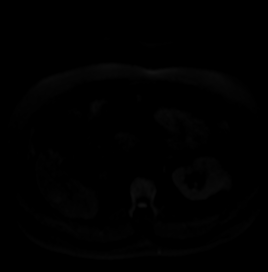
[im 84/84]
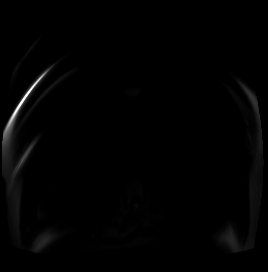

[Series 8: DWI · axial · 6.0mm · 1.49mm/px · 1 of 42 slices shown (2 of 2)]
[im 1/42]
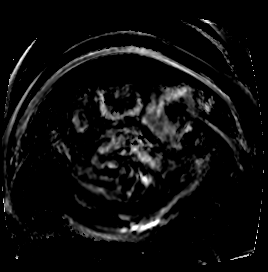

[Series 9: T1 · axial · 3.0mm · 1.25mm/px · z∈[-122,+139]mm · 3 of 88 slices shown (1 of 2)]
[im 1/88]
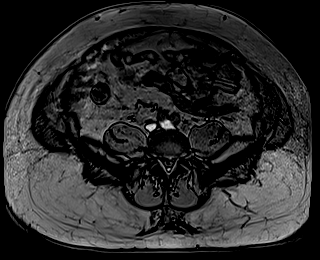
[im 44/88]
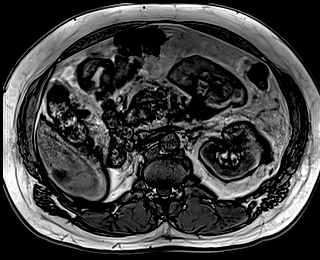
[im 88/88]
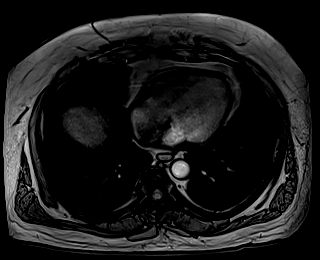

[Series 10: T1 · axial · 3.0mm · 1.25mm/px · z∈[-122,+139]mm · 3 of 88 slices shown (2 of 2)]
[im 1/88]
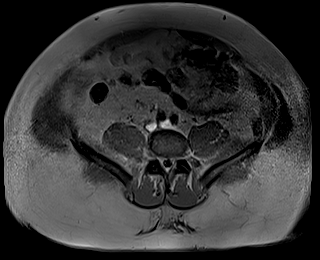
[im 44/88]
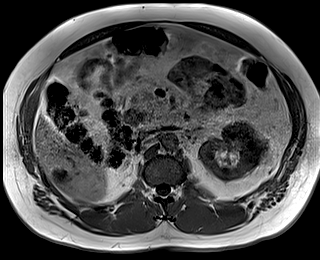
[im 88/88]
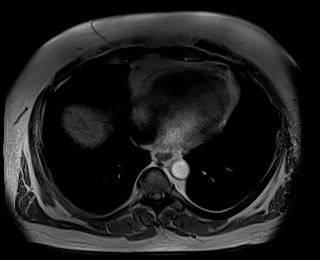

[Series 11: bSSFP · axial · 5.0mm · 0.84mm/px · z∈[-134,+163]mm · 2 of 55 slices shown]
[im 1/55]
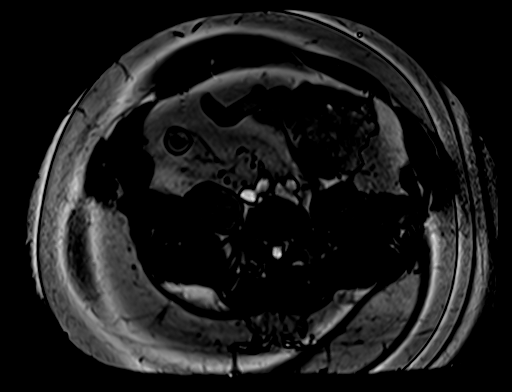
[im 55/55]
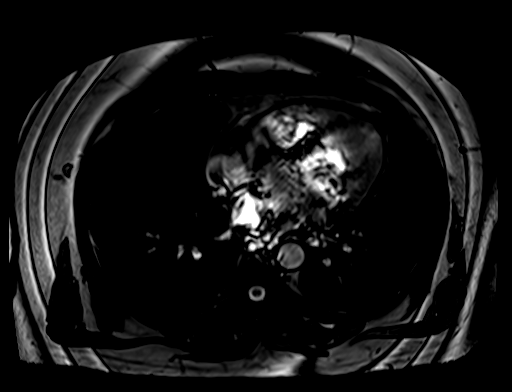

[Series 13: T1 dynamic · axial · 3.0mm · 1.25mm/px · z∈[-138,+147]mm · 3 of 96 slices shown (1 of 11)]
[im 1/96]
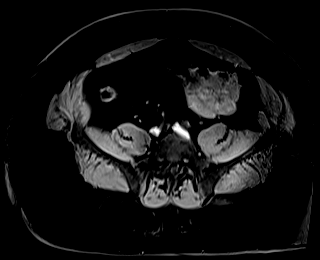
[im 48/96]
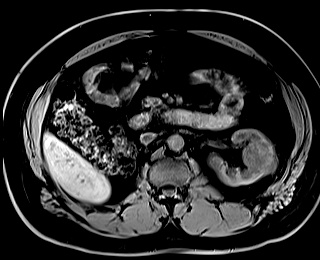
[im 96/96]
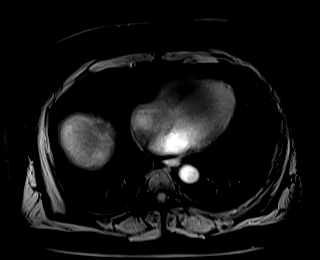

[Series 17: T1 dynamic · axial · 3.0mm · 1.25mm/px · z∈[-138,+147]mm · 3 of 96 slices shown (2 of 11)]
[im 1/96]
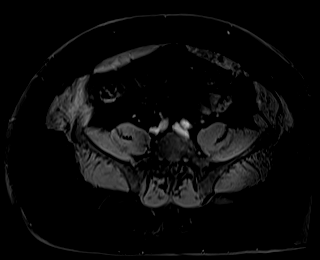
[im 48/96]
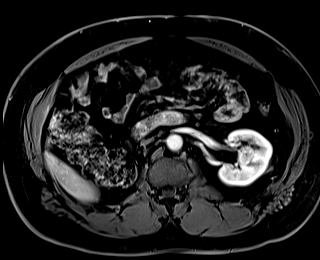
[im 96/96]
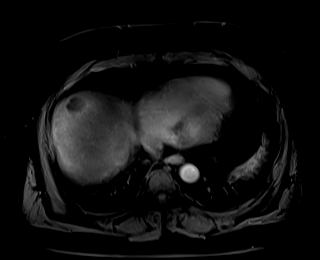

[Series 18: T1 dynamic · axial · 3.0mm · 1.25mm/px · z∈[-138,+147]mm · 3 of 96 slices shown (3 of 11)]
[im 1/96]
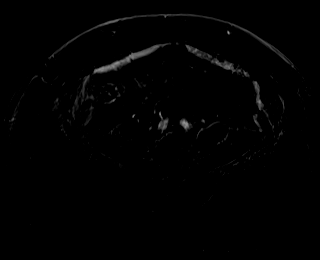
[im 48/96]
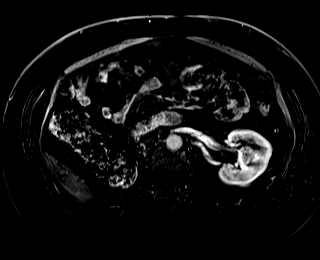
[im 96/96]
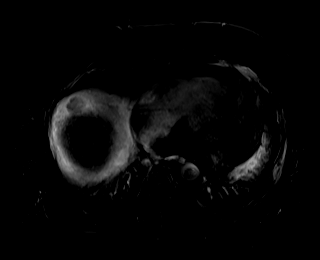

[Series 21: T1 dynamic · axial · 3.0mm · 1.25mm/px · z∈[-138,+147]mm · 3 of 96 slices shown (4 of 11)]
[im 1/96]
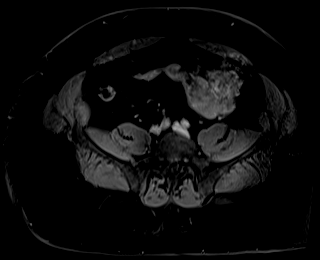
[im 48/96]
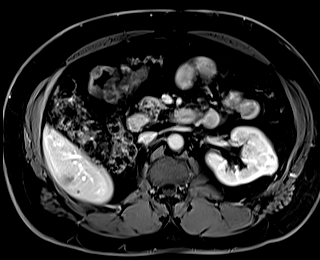
[im 96/96]
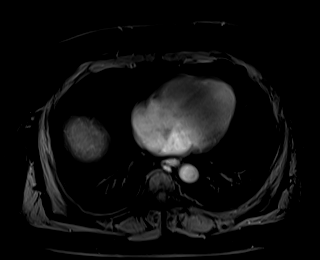

[Series 22: T1 dynamic · axial · 3.0mm · 1.25mm/px · z∈[-138,+147]mm · 3 of 96 slices shown (5 of 11)]
[im 1/96]
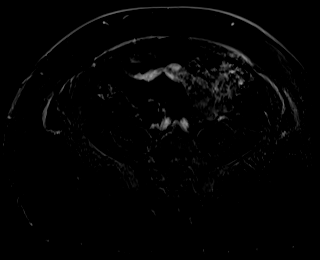
[im 48/96]
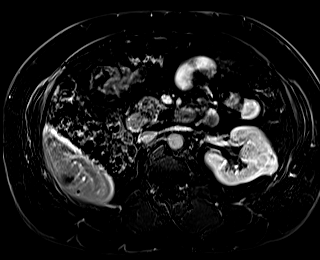
[im 96/96]
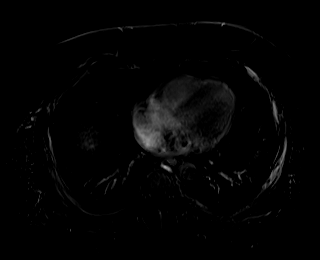

[Series 25: T1 dynamic · axial · 3.0mm · 1.25mm/px · z∈[-138,+147]mm · 3 of 96 slices shown (6 of 11)]
[im 1/96]
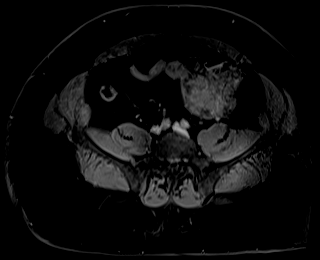
[im 48/96]
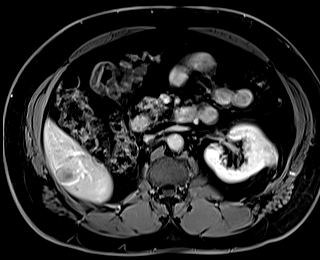
[im 96/96]
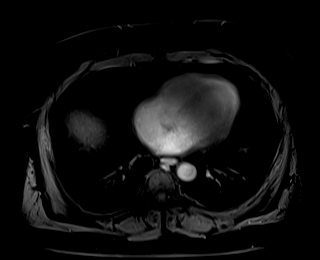

[Series 26: T1 dynamic · axial · 3.0mm · 1.25mm/px · z∈[-138,+147]mm · 3 of 96 slices shown (7 of 11)]
[im 1/96]
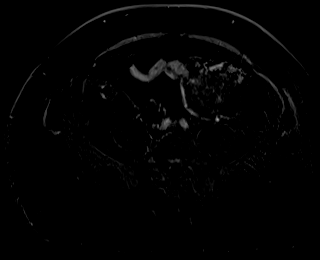
[im 48/96]
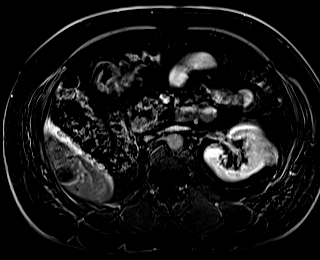
[im 96/96]
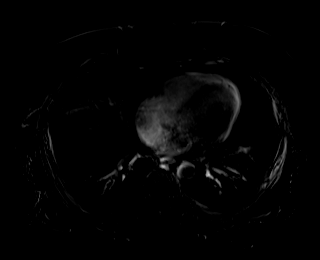

[Series 28: T1 dynamic · coronal · 5.0mm · 1.41mm/px · 2 of 60 slices shown (8 of 11)]
[im 1/60]
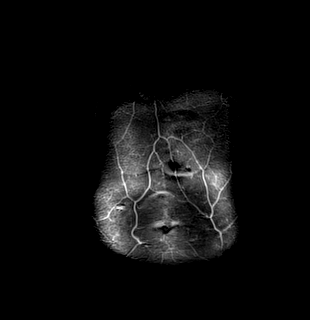
[im 60/60]
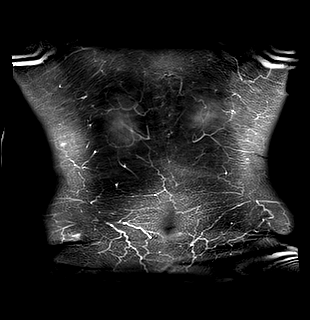

[Series 29: T2 · axial · 6.0mm · 1.56mm/px · 1 of 42 slices shown (2 of 2)]
[im 1/42]
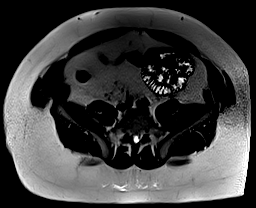

[Series 32: T1 dynamic · axial · 3.0mm · 1.25mm/px · z∈[-138,+147]mm · 3 of 96 slices shown (9 of 11)]
[im 1/96]
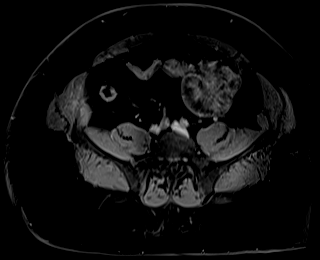
[im 48/96]
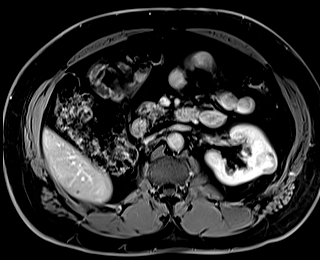
[im 96/96]
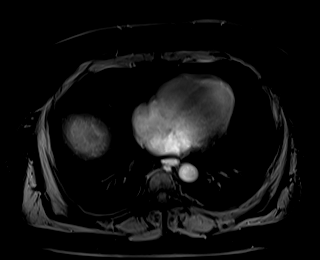

[Series 33: T1 dynamic · axial · 3.0mm · 1.25mm/px · z∈[-138,+147]mm · 3 of 96 slices shown (10 of 11)]
[im 1/96]
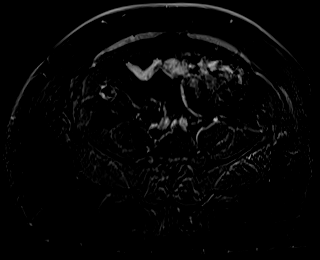
[im 48/96]
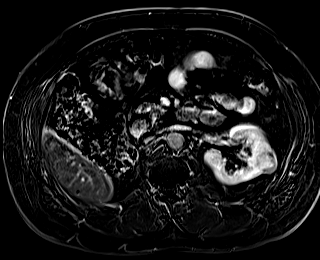
[im 96/96]
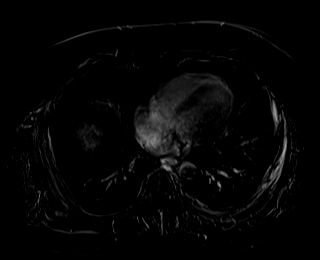

[Series 35: T1 dynamic · coronal · 5.0mm · 1.41mm/px · 1 of 52 slices shown (11 of 11)]
[im 1/52]
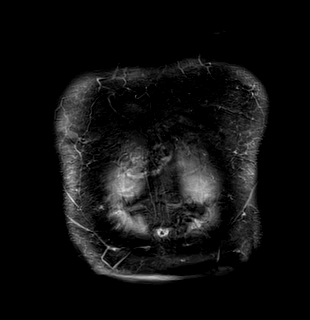

[47 of 48 positions shown; findings below may reference images not displayed]

FINDINGS: Lower chest: Patient's known tiny pulmonary nodules are not well
demonstrated on MRI.

Hepatobiliary: Scattered hepatic cysts of varying size are seen in
both lobes of the liver. There is no evidence for gallstones,
gallbladder wall thickening, or pericholecystic fluid. No
intrahepatic or extrahepatic biliary dilation.

Pancreas: No focal mass lesion. No dilatation of the main duct. No
intraparenchymal cyst. No peripancreatic edema.

Spleen:  No splenomegaly. No focal mass lesion.

Adrenals/Urinary Tract: No adrenal nodule or mass. Right kidney
surgically absent with unremarkable appearance of the nephrectomy
bed.

The area of contour deformation in the interpolar left kidney
laterally noted on recent CT appears to be 2 distinct lesions,
probably best appreciated on axial T2 series 5 images 22 and 23. The
more cranial and lateral of the 2 lesions measures 2.8 x 2.8 x
cm and is relatively well-defined but has heterogeneous internal
signal characteristics. This lesion also shows heterogeneous
enhancement after IV contrast administration. This lesion bulges the
lateral capsule.

The second lesion is just inferior and medial to the above described
lesion. This lesion measures approximately 2.1 x 2.0 x 1.6 cm and
deforms the renal contour medially into the central sinus. This
lesion is also relatively well defined and shows heterogeneous
enhancement after IV contrast administration.

There may be a third lesion in the posterior lip of the interpolar
left kidney although this is only visible on the axial T2 images
(image 25 of series 5). This shows subtle decreased signal intensity
on T2 imaging but appears isointense to background renal parenchyma
on all remaining pulse sequences.

Stomach/Bowel: Stomach is unremarkable. No gastric wall thickening.
No evidence of outlet obstruction. Duodenum is normally positioned
as is the ligament of Treitz. No small bowel or colonic dilatation
within the visualized abdomen.

Vascular/Lymphatic: No abdominal aortic aneurysm. No abdominal
lymphadenopathy. No evidence for filling defect in the left renal
vein. Although this is not a dedicated MRA, only a single left renal
artery can be identified in there is no evidence for retroaortic or
circumaortic renal vein.

Other:  No intraperitoneal free fluid.

Musculoskeletal: No focal suspicious marrow enhancement within the
visualized bony anatomy.
IMPRESSION: 1. Two distinct lesions identified in the interpolar left kidney
corresponding to the region of contour deformation on recent CT
imaging. Both lesions demonstrate well-defined margins and diffusely
heterogeneous enhancement after IV contrast administration. Imaging
features are compatible with neoplasm/renal cell carcinoma.
2. There may be a third lesion in the posterior lip of the
interpolar left kidney although this is only visible on the axial T2
images and appears isointense to background renal parenchyma on all
remaining pulse sequences. This finding is considered indeterminate.
3. No evidence for filling defect in the left renal vein. No
evidence for lymphadenopathy or metastatic disease in the abdomen.
4. Hepatic cysts.

## 2021-04-16 MED ORDER — GADOBUTROL 1 MMOL/ML IV SOLN
10.0000 mL | Freq: Once | INTRAVENOUS | Status: AC | PRN
  Administered 2021-04-16: 10 mL via INTRAVENOUS

## 2021-04-17 ENCOUNTER — Other Ambulatory Visit: Payer: Self-pay | Admitting: Interventional Radiology

## 2021-04-17 DIAGNOSIS — N2889 Other specified disorders of kidney and ureter: Secondary | ICD-10-CM

## 2021-04-18 ENCOUNTER — Telehealth: Payer: Self-pay | Admitting: Oncology

## 2021-04-18 ENCOUNTER — Other Ambulatory Visit: Payer: Self-pay | Admitting: Oncology

## 2021-04-18 NOTE — Telephone Encounter (Signed)
Scheduled appt per 6/14 sch msg. Called pt no answer. Left msg with appt date and time. Pt had requested to be seen before 9/2.

## 2021-04-18 NOTE — Progress Notes (Signed)
The results of the MRI was personally reviewed and discussed with the patient today.  His case was also discussed with Dr. Dwaine Gale from interventional radiology as well as Dr. Latanya Presser patient's previous oncologist at [facility].  Risks and benefits of proceeding with local therapy with ablation versus restarting Pembrolizumab were discussed at this time.   The risk of worsening kidney function and the possible need for systemic therapy in the future weight against treating only with systemic therapy with no guarantee of treating his kidney recurrence were discussed.  Both choices were reviewed and discussed with the patient extensively and he will make his decision in the near future.  All his questions are answered to his satisfaction.

## 2021-04-24 ENCOUNTER — Other Ambulatory Visit: Payer: Self-pay | Admitting: Oncology

## 2021-04-24 ENCOUNTER — Telehealth: Payer: Self-pay

## 2021-04-24 DIAGNOSIS — N2889 Other specified disorders of kidney and ureter: Secondary | ICD-10-CM

## 2021-04-24 NOTE — Telephone Encounter (Signed)
Pt called and states he has several questions regarding referral to IR and asks if Dr Alen Blew can call him, as he may want a 2nd opinion. This LPN asked pt if there were questions I could answer, and pt declines. Please advise if you would like virtual visit scheduled.

## 2021-05-01 ENCOUNTER — Other Ambulatory Visit: Payer: BC Managed Care – PPO

## 2021-05-04 ENCOUNTER — Other Ambulatory Visit: Payer: Self-pay | Admitting: Oncology

## 2021-05-04 ENCOUNTER — Ambulatory Visit
Admission: RE | Admit: 2021-05-04 | Discharge: 2021-05-04 | Disposition: A | Discharge status: Home / Self Care | Payer: Self-pay | Source: Ambulatory Visit | Attending: Oncology | Admitting: Oncology

## 2021-05-04 ENCOUNTER — Telehealth: Payer: Self-pay | Admitting: Oncology

## 2021-05-04 DIAGNOSIS — N2889 Other specified disorders of kidney and ureter: Secondary | ICD-10-CM

## 2021-05-04 DIAGNOSIS — Z125 Encounter for screening for malignant neoplasm of prostate: Secondary | ICD-10-CM | POA: Diagnosis not present

## 2021-05-04 DIAGNOSIS — E559 Vitamin D deficiency, unspecified: Secondary | ICD-10-CM | POA: Diagnosis not present

## 2021-05-04 DIAGNOSIS — E785 Hyperlipidemia, unspecified: Secondary | ICD-10-CM | POA: Diagnosis not present

## 2021-05-04 DIAGNOSIS — E039 Hypothyroidism, unspecified: Secondary | ICD-10-CM | POA: Diagnosis not present

## 2021-05-04 NOTE — Telephone Encounter (Signed)
Called patient regarding upcoming July and August appointments, left a voicemail. 

## 2021-05-08 ENCOUNTER — Encounter: Payer: Self-pay | Admitting: *Deleted

## 2021-05-08 ENCOUNTER — Other Ambulatory Visit: Payer: Self-pay | Admitting: Oncology

## 2021-05-08 ENCOUNTER — Ambulatory Visit
Admission: RE | Admit: 2021-05-08 | Discharge: 2021-05-08 | Disposition: A | Discharge status: Home / Self Care | Payer: Self-pay | Source: Ambulatory Visit | Attending: Oncology | Admitting: Oncology

## 2021-05-08 ENCOUNTER — Ambulatory Visit
Admission: RE | Admit: 2021-05-08 | Discharge: 2021-05-08 | Disposition: A | Discharge status: Home / Self Care | Payer: BC Managed Care – PPO | Source: Ambulatory Visit | Attending: Oncology | Admitting: Oncology

## 2021-05-08 DIAGNOSIS — N2889 Other specified disorders of kidney and ureter: Secondary | ICD-10-CM

## 2021-05-08 HISTORY — PX: IR RADIOLOGIST EVAL & MGMT: IMG5224

## 2021-05-08 NOTE — Progress Notes (Signed)
Chief Complaint: Patient was seen in consultation today for   Referring Physician(s): Shadad,Firas N  History of Present Illness: Michael [name] is a 55 y.o. male seen previously by my partner, Dr. Sharen Heck Mir, on 04/04/21 for consultation regarding a left renal mass.  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  He developed metastatic disease to the left hilum in 2021 and began immunotherapy which resulted in resolution of left hilar disease.  He has had some small pulmonary nodules that have been followed by serial imaging and have been stable.  Recent unenhanced CT suggested the presence of lateral contour bulge of the mid left kidney suspicious for potential underlying mass. He saw Dr. Dwaine Gale who recommended MRI characterization of the mass. MRI was performed on 04/16/21 and shows two separate enhancing left renal masses with a more peripheral rounded interpolar lesion measuring 2.8 cm and a deeper enhancing lesion near the renal sinus measuring 2.1 x 1.6 x 2.0 cm. A subtle rounded area of the posterior left kidney was considered indeterminate.  Mr. Wrage remains asymptomatic. He has met with Dr. Alen Blew to discuss restarting immunotherapy in the future versus possible need for systemic therapy. He is here today for a second IR opinion after MRI results.  Past Medical History:  Diagnosis Date   Cancer [facility])    Renal disorder     Allergies: Penicillins  Medications: Prior to Admission medications   Medication Sig Start Date End Date Taking? Authorizing Provider  amLODipine (NORVASC) 10 MG tablet Take 10 mg by mouth daily.    [provider]  cholecalciferol (VITAMIN D3) 25 MCG (1000 UNIT) tablet Take 2,000 Units by mouth daily.    [provider]  levothyroxine (SYNTHROID) 100 MCG tablet TAKE 1 TABLET(100 MCG) BY MOUTH DAILY BEFORE BREAKFAST 02/01/21   Wyatt Portela, MD   montelukast (SINGULAIR) 10 MG tablet Take 10 mg by mouth at bedtime.    [provider]  predniSONE (DELTASONE) 5 MG tablet Take 1 tablet (5 mg total) by mouth daily with breakfast. Patient not taking: Reported on 07/13/2020 06/01/20   Wyatt Portela, MD  tadalafil (CIALIS) 20 MG tablet Take 20 mg by mouth daily as needed for erectile dysfunction.    [provider]  zinc gluconate 50 MG tablet Take 50 mg by mouth daily.    [provider]     No family history on file.  Social History   Socioeconomic History   Marital status: Married    Spouse name: Not on file   Number of children: Not on file   Years of education: Not on file   Highest education level: Not on file  Occupational History   Not on file  Tobacco Use   Smoking status: Not on file   Smokeless tobacco: Not on file  Substance and Sexual Activity   Alcohol use: Not on file   Drug use: Not on file   Sexual activity: Not on file  Other Topics Concern   Not on file  Social History Narrative   Not on file   Social Determinants of Health   Financial Resource Strain: Not on file  Food Insecurity: Not on file  Transportation Needs: Not on file  Physical Activity: Not on file  Stress: Not on file  Social Connections: Not on file    ECOG Status: 0 - Asymptomatic  Review of Systems: A 12  point ROS discussed and pertinent positives are indicated in the HPI above.  All other systems are negative.  Review of Systems  Constitutional: Negative.   Respiratory: Negative.    Cardiovascular: Negative.   Genitourinary: Negative.   Musculoskeletal: Negative.   Neurological: Negative.    Vital Signs: There were no vitals taken for this visit.   Imaging: MR ABDOMEN W WO CONTRAST  Result Date: 04/17/2021 CLINICAL DATA:  Renal cancer status post right nephrectomy. EXAM: MRI ABDOMEN WITHOUT AND WITH CONTRAST TECHNIQUE: Multiplanar multisequence MR imaging of the abdomen was performed both  before and after the administration of intravenous contrast. CONTRAST:  18m GADAVIST GADOBUTROL 1 MMOL/ML IV SOLN COMPARISON:  Abdomen and pelvis CT 03/27/2021 FINDINGS: Lower chest: Patient's known tiny pulmonary nodules are not well demonstrated on MRI. Hepatobiliary: Scattered hepatic cysts of varying size are seen in both lobes of the liver. There is no evidence for gallstones, gallbladder wall thickening, or pericholecystic fluid. No intrahepatic or extrahepatic biliary dilation. Pancreas: No focal mass lesion. No dilatation of the main duct. No intraparenchymal cyst. No peripancreatic edema. Spleen:  No splenomegaly. No focal mass lesion. Adrenals/Urinary Tract: No adrenal nodule or mass. Right kidney surgically absent with unremarkable appearance of the nephrectomy bed. The area of contour deformation in the interpolar left kidney laterally noted on recent CT appears to be 2 distinct lesions, probably best appreciated on axial T2 series 5 images 22 and 23. The more cranial and lateral of the 2 lesions measures 2.8 x 2.8 x 2.7 cm and is relatively well-defined but has heterogeneous internal signal characteristics. This lesion also shows heterogeneous enhancement after IV contrast administration. This lesion bulges the lateral capsule. The second lesion is just inferior and medial to the above described lesion. This lesion measures approximately 2.1 x 2.0 x 1.6 cm and deforms the renal contour medially into the central sinus. This lesion is also relatively well defined and shows heterogeneous enhancement after IV contrast administration. There may be a third lesion in the posterior lip of the interpolar left kidney although this is only visible on the axial T2 images (image 25 of series 5). This shows subtle decreased signal intensity on T2 imaging but appears isointense to background renal parenchyma on all remaining pulse sequences. Stomach/Bowel: Stomach is unremarkable. No gastric wall thickening. No  evidence of outlet obstruction. Duodenum is normally positioned as is the ligament of Treitz. No small bowel or colonic dilatation within the visualized abdomen. Vascular/Lymphatic: No abdominal aortic aneurysm. No abdominal lymphadenopathy. No evidence for filling defect in the left renal vein. Although this is not a dedicated MRA, only a single left renal artery can be identified in there is no evidence for retroaortic or circumaortic renal vein. Other:  No intraperitoneal free fluid. Musculoskeletal: No focal suspicious marrow enhancement within the visualized bony anatomy. IMPRESSION: 1. Two distinct lesions identified in the interpolar left kidney corresponding to the region of contour deformation on recent CT imaging. Both lesions demonstrate well-defined margins and diffusely heterogeneous enhancement after IV contrast administration. Imaging features are compatible with neoplasm/renal cell carcinoma. 2. There may be a third lesion in the posterior lip of the interpolar left kidney although this is only visible on the axial T2 images and appears isointense to background renal parenchyma on all remaining pulse sequences. This finding is considered indeterminate. 3. No evidence for filling defect in the left renal vein. No evidence for lymphadenopathy or metastatic disease in the abdomen. 4. Hepatic cysts. Electronically Signed   By: ERandall Hiss  Tery Sanfilippo M.D.   On: 04/17/2021 09:32    Labs:  CBC: Recent Labs    10/31/20 1111 12/26/20 1100 02/08/21 1028 03/27/21 1100  WBC 6.0 5.7 6.5 6.0  HGB 14.5 15.1 15.0 15.3  HCT 42.7 44.8 44.2 45.7  PLT 245 236 263 250    COAGS: No results for input(s): INR, APTT in the last 8760 hours.  BMP: Recent Labs    06/12/20 0835 07/13/20 0115 07/24/20 0855 08/01/20 1317 08/16/20 1010 10/31/20 1111 12/26/20 1100 02/08/21 1028 03/27/21 1100  NA 141 133* 139 141   < > 141 139 143 141  K 4.3 3.6 4.9 5.1   < > 4.1 4.6 4.6 4.5  CL 106 101 105 102   < > 106 105  106 104  CO2 _0 < > _1 GLUCOSE 94 119* 97 85   < > 121* 102* 96 100*  BUN _2 < > _3 CALCIUM 9.4 8.9 8.9 9.6   < > 9.2 9.1 8.9 9.3  CREATININE 1.49* 1.61* 1.45* 1.51*   < > 1.29* 1.40* 1.33* 1.22  GFRNONAA 53* 48* 55* 52*   < > >60 >60 >60 >60  GFRAA >60 56* >60 >60  --   --   --   --   --    < > = values in this interval not displayed.    LIVER FUNCTION TESTS: Recent Labs    10/31/20 1111 12/26/20 1100 02/08/21 1028 03/27/21 1100  BILITOT 0.6 0.8 0.7 0.7  AST _4 ALT _5 ALKPHOS 73 85 99 105  PROT 7.2 7.7 7.7 7.7  ALBUMIN 3.9 4.3 4.2 4.2    Assessment and Plan:  I met in person with Mr. Beazer and and his wife in strictly a counseling role today. We reviewed the recent MRI of the abdomen as well as multiple prior imaging studies.  There does appear to be 2 distinct separate masses of the left kidney with the larger and more peripheral 2.8 cm lesion demonstrating slightly more prominent internal contrast enhancement.  The smaller, deeper lesion encroaching upon the renal sinus does appear to be discrete on unenhanced images but enhances fairly equally to adjacent renal parenchyma after administration of contrast.  Given his history of clear-cell carcinoma in the contralateral kidney, there is a high likelihood of renal malignancy in the left kidney given imaging appearance.  We discussed local treatment options including combinations of percutaneous ablative therapies and biopsy for tissue diagnosis.  The peripheral mass centered in the interpolar cortex is easier to treat with percutaneous cryoablation and can be sampled at the time of ablation.  In the setting of a solitary kidney and with a history of some degree of prior renal insufficiency, I recommended not immediately trying to also treat the deeper lesion with ablation in the same setting.  Biopsy of the second lesion could be performed at that time to try to establish  a tissue diagnosis.  Should ablation become necessary of this lesion, a staged approach could be performed allowing recovery from the first ablation procedure and determining what impact it has on overall renal function.  After discussion, Mr. Weatherall and his wife would like to proceed with ablation and biopsy of the larger mass and biopsy of the smaller/deeper mass at that time.  I did recommend considering eventually being under the care of a nephrologist,  especially if we eventually have to perform two staged ablation procedures of his solitary left kidney.  Mr. Angelino is out of town the first week of August and also is planning a trip abroad in September.  We will begin the scheduling process for renal cryoablation and biopsy of both renal lesions at [facility] under general anesthesia.  We will plan for overnight observation after the procedure.  Electronically Signed: Azzie Roup 05/08/2021, 9:01 AM    I spent a total of 40 Minutes   in face to face in clinical consultation, greater than 50% of which was counseling/coordinating care for left renal masses.

## 2021-05-10 ENCOUNTER — Other Ambulatory Visit (HOSPITAL_COMMUNITY): Payer: Self-pay | Admitting: Interventional Radiology

## 2021-05-10 DIAGNOSIS — N2889 Other specified disorders of kidney and ureter: Secondary | ICD-10-CM

## 2021-05-11 ENCOUNTER — Other Ambulatory Visit: Payer: Self-pay

## 2021-05-11 ENCOUNTER — Inpatient Hospital Stay: Payer: BC Managed Care – PPO | Attending: Oncology

## 2021-05-11 DIAGNOSIS — Z905 Acquired absence of kidney: Secondary | ICD-10-CM | POA: Diagnosis not present

## 2021-05-11 DIAGNOSIS — E039 Hypothyroidism, unspecified: Secondary | ICD-10-CM | POA: Diagnosis not present

## 2021-05-11 DIAGNOSIS — C7800 Secondary malignant neoplasm of unspecified lung: Secondary | ICD-10-CM | POA: Insufficient documentation

## 2021-05-11 DIAGNOSIS — R7989 Other specified abnormal findings of blood chemistry: Secondary | ICD-10-CM | POA: Diagnosis not present

## 2021-05-11 DIAGNOSIS — Z7952 Long term (current) use of systemic steroids: Secondary | ICD-10-CM | POA: Diagnosis not present

## 2021-05-11 DIAGNOSIS — K754 Autoimmune hepatitis: Secondary | ICD-10-CM | POA: Diagnosis not present

## 2021-05-11 DIAGNOSIS — C641 Malignant neoplasm of right kidney, except renal pelvis: Secondary | ICD-10-CM | POA: Insufficient documentation

## 2021-05-11 DIAGNOSIS — Z79899 Other long term (current) drug therapy: Secondary | ICD-10-CM | POA: Insufficient documentation

## 2021-05-11 DIAGNOSIS — C649 Malignant neoplasm of unspecified kidney, except renal pelvis: Secondary | ICD-10-CM

## 2021-05-11 DIAGNOSIS — Z880 Allergy status to penicillin: Secondary | ICD-10-CM | POA: Diagnosis not present

## 2021-05-11 LAB — CMP (CANCER CENTER ONLY)
ALT: 24 U/L (ref 0–44)
AST: 23 U/L (ref 15–41)
Albumin: 4 g/dL (ref 3.5–5.0)
Alkaline Phosphatase: 112 U/L (ref 38–126)
Anion gap: 10 (ref 5–15)
BUN: 17 mg/dL (ref 6–20)
CO2: 26 mmol/L (ref 22–32)
Calcium: 9 mg/dL (ref 8.9–10.3)
Chloride: 104 mmol/L (ref 98–111)
Creatinine: 1.26 mg/dL — ABNORMAL HIGH (ref 0.61–1.24)
GFR, Estimated: >60 mL/min (ref >60)
Glucose, Bld: 96 mg/dL (ref 70–99)
Potassium: 4.2 mmol/L (ref 3.5–5.1)
Sodium: 140 mmol/L (ref 135–145)
Total Bilirubin: 0.7 mg/dL (ref 0.3–1.2)
Total Protein: 7.6 g/dL (ref 6.5–8.1)

## 2021-05-11 LAB — CBC WITH DIFFERENTIAL (CANCER CENTER ONLY)
Abs Immature Granulocytes: 0.01 10*3/uL (ref 0.00–0.07)
Basophils Absolute: 0 10*3/uL (ref 0.0–0.1)
Basophils Relative: 0 %
Eosinophils Absolute: 0.2 10*3/uL (ref 0.0–0.5)
Eosinophils Relative: 4 %
HCT: 44.9 % (ref 39.0–52.0)
Hemoglobin: 15.4 g/dL (ref 13.0–17.0)
Immature Granulocytes: 0 %
Lymphocytes Relative: 30 %
Lymphs Abs: 1.7 10*3/uL (ref 0.7–4.0)
MCH: 29.7 pg (ref 26.0–34.0)
MCHC: 34.3 g/dL (ref 30.0–36.0)
MCV: 86.7 fL (ref 80.0–100.0)
Monocytes Absolute: 0.4 10*3/uL (ref 0.1–1.0)
Monocytes Relative: 7 %
Neutro Abs: 3.4 10*3/uL (ref 1.7–7.7)
Neutrophils Relative %: 59 %
Platelet Count: 254 10*3/uL (ref 150–400)
RBC: 5.18 MIL/uL (ref 4.22–5.81)
RDW: 12.8 % (ref 11.5–15.5)
WBC Count: 5.8 10*3/uL (ref 4.0–10.5)
nRBC: 0 % (ref 0.0–0.2)

## 2021-05-11 LAB — TSH: TSH: 4.315 u[IU]/mL — ABNORMAL HIGH (ref 0.320–4.118)

## 2021-05-14 DIAGNOSIS — I129 Hypertensive chronic kidney disease with stage 1 through stage 4 chronic kidney disease, or unspecified chronic kidney disease: Secondary | ICD-10-CM | POA: Diagnosis not present

## 2021-05-14 DIAGNOSIS — Z23 Encounter for immunization: Secondary | ICD-10-CM | POA: Diagnosis not present

## 2021-05-14 DIAGNOSIS — C641 Malignant neoplasm of right kidney, except renal pelvis: Secondary | ICD-10-CM | POA: Diagnosis not present

## 2021-05-14 DIAGNOSIS — R82998 Other abnormal findings in urine: Secondary | ICD-10-CM | POA: Diagnosis not present

## 2021-05-14 DIAGNOSIS — Z0000 Encounter for general adult medical examination without abnormal findings: Secondary | ICD-10-CM | POA: Diagnosis not present

## 2021-05-14 DIAGNOSIS — Z1212 Encounter for screening for malignant neoplasm of rectum: Secondary | ICD-10-CM | POA: Diagnosis not present

## 2021-05-16 DIAGNOSIS — M79672 Pain in left foot: Secondary | ICD-10-CM | POA: Diagnosis not present

## 2021-05-23 ENCOUNTER — Encounter (HOSPITAL_COMMUNITY)
Admission: RE | Admit: 2021-05-23 | Discharge: 2021-05-23 | Disposition: A | Discharge status: Home / Self Care | Payer: BC Managed Care – PPO | Source: Ambulatory Visit | Attending: Interventional Radiology | Admitting: Interventional Radiology

## 2021-05-23 ENCOUNTER — Other Ambulatory Visit: Payer: Self-pay | Admitting: Student

## 2021-05-23 ENCOUNTER — Encounter (HOSPITAL_COMMUNITY): Payer: Self-pay

## 2021-05-23 ENCOUNTER — Other Ambulatory Visit: Payer: Self-pay

## 2021-05-23 DIAGNOSIS — Z01818 Encounter for other preprocedural examination: Secondary | ICD-10-CM | POA: Insufficient documentation

## 2021-05-23 DIAGNOSIS — M65322 Trigger finger, left index finger: Secondary | ICD-10-CM | POA: Diagnosis not present

## 2021-05-23 HISTORY — DX: Hypothyroidism, unspecified: E03.9

## 2021-05-23 HISTORY — DX: Essential (primary) hypertension: I10

## 2021-05-23 HISTORY — DX: Sleep apnea, unspecified: G47.30

## 2021-05-23 NOTE — Progress Notes (Addendum)
PCP - Dr.  [name] Cardiologist - no  PPM/ICD -  Device Orders -  Rep Notified -   Chest x-ray - CT chest- 12-27-20 epic EKG -  Stress Test -  ECHO -  Cardiac Cath -  CBC/diff, CMP 05-11-21 epic  Sleep Study -  CPAP -   Fasting Blood Sugar -  Checks Blood Sugar _____ times a day  Blood Thinner Instructions: Aspirin Instructions:  ERAS Protcol - PRE-SURGERY Ensure or G2-   COVID TEST- 05-28-21  Activity--Able to walk a flight of stairs without SOB  Anesthesia review: HTN, Right nephrectomy  Patient denies shortness of breath, fever, cough and chest pain at PAT appointment   All instructions explained to the patient, with a verbal understanding of the material. Patient agrees to go over the instructions while at home for a better understanding. Patient also instructed to self quarantine after being tested for COVID-19. The opportunity to ask questions was provided.

## 2021-05-23 NOTE — Patient Instructions (Signed)
DUE TO COVID-19 ONLY ONE VISITOR IS ALLOWED TO COME WITH YOU AND STAY IN THE WAITING ROOM ONLY DURING PRE OP AND PROCEDURE DAY OF SURGERY.   TWO VISITOR  MAY VISIT WITH YOU AFTER SURGERY IN YOUR PRIVATE ROOM DURING VISITING HOURS ONLY!  YOU NEED TO HAVE A COVID 19 TEST ON__7-25-22_____ @_______ , THIS TEST MUST BE DONE BEFORE SURGERY,    COVID TESTING SITE [address] [place] [address],   IT IS ON THE RIGHT GOING OUT [address] APPROXIMATELY  2 MINUTES PAST [employer] ON THE RIGHT. ONCE YOUR COVID TEST IS COMPLETED,  PLEASE Wear a mask when in public           Your procedure is scheduled on: 05-30-21   Report to [facility] Main  Entrance   Report to admitting at      10:00 AM     Call this number if you have problems the morning of surgery [phone]    Remember: Do not eat food or drink liquids :After Midnight.   BRUSH YOUR TEETH MORNING OF SURGERY AND RINSE YOUR MOUTH OUT, NO CHEWING GUM CANDY OR MINTS.     Take these medicines the morning of surgery with A SIP OF WATER: amlodipine, singulair  DO NOT TAKE ANY DIABETIC MEDICATIONS DAY OF YOUR SURGERY                               You may not have any metal on your body including hair pins and              piercings  Do not wear jewelry, lotions, powders or perfumes, deodorant                     Men may shave face and neck.   Do not bring valuables to the hospital. [place].  Contacts, dentures or bridgework may not be worn into surgery.       Patients discharged the day of surgery will not be allowed to drive home. IF YOU ARE HAVING SURGERY AND GOING HOME THE SAME DAY, YOU MUST HAVE AN ADULT TO DRIVE YOU HOME AND BE WITH YOU FOR 24 HOURS. YOU MAY GO HOME BY TAXI OR UBER OR ORTHERWISE, BUT AN ADULT MUST ACCOMPANY YOU HOME AND STAY WITH YOU FOR 24 HOURS.  Name and phone number of your driver:  Special Instructions: N/A               Please read over the following fact sheets you were given: _____________________________________________________________________             [facility] - Preparing for Surgery Before surgery, you can play an important role.  Because skin is not sterile, your skin needs to be as free of germs as possible.  You can reduce the number of germs on your skin by washing with CHG (chlorahexidine gluconate) soap before surgery.  CHG is an antiseptic cleaner which kills germs and bonds with the skin to continue killing germs even after washing. Please DO NOT use if you have an allergy to CHG or antibacterial soaps.  If your skin becomes reddened/irritated stop using the CHG and inform your nurse when you arrive at Short Stay. Do not shave (including legs and underarms) for at least 48 hours  prior to the first CHG shower.  You may shave your face/neck. Please follow these instructions carefully:  1.  Shower with CHG Soap the night before surgery and the  morning of Surgery.  2.  If you choose to wash your hair, wash your hair first as usual with your  normal  shampoo.  3.  After you shampoo, rinse your hair and body thoroughly to remove the  shampoo.                           4.  Use CHG as you would any other liquid soap.  You can apply chg directly  to the skin and wash                       Gently with a scrungie or clean washcloth.  5.  Apply the CHG Soap to your body ONLY FROM THE NECK DOWN.   Do not use on face/ open                           Wound or open sores. Avoid contact with eyes, ears mouth and genitals (private parts).                       Wash face,  Genitals (private parts) with your normal soap.             6.  Wash thoroughly, paying special attention to the area where your surgery  will be performed.  7.  Thoroughly rinse your body with warm water from the neck down.  8.  DO NOT shower/wash with your normal soap after using and rinsing off  the CHG Soap.                9.  Pat yourself  dry with a clean towel.            10.  Wear clean pajamas.            11.  Place clean sheets on your bed the night of your first shower and do not  sleep with pets. Day of Surgery : Do not apply any lotions/deodorants the morning of surgery.  Please wear clean clothes to the hospital/surgery center.  FAILURE TO FOLLOW THESE INSTRUCTIONS MAY RESULT IN THE CANCELLATION OF YOUR SURGERY PATIENT SIGNATURE_________________________________  NURSE SIGNATURE__________________________________  ________________________________________________________________________

## 2021-05-28 ENCOUNTER — Other Ambulatory Visit (HOSPITAL_COMMUNITY)
Admission: RE | Admit: 2021-05-28 | Discharge: 2021-05-28 | Disposition: A | Discharge status: Home / Self Care | Payer: BC Managed Care – PPO | Source: Ambulatory Visit | Attending: Interventional Radiology | Admitting: Interventional Radiology

## 2021-05-28 DIAGNOSIS — Z01812 Encounter for preprocedural laboratory examination: Secondary | ICD-10-CM | POA: Insufficient documentation

## 2021-05-28 DIAGNOSIS — Z20822 Contact with and (suspected) exposure to covid-19: Secondary | ICD-10-CM | POA: Insufficient documentation

## 2021-05-28 LAB — SARS CORONAVIRUS 2 (TAT 6-24 HRS): SARS Coronavirus 2: NEGATIVE

## 2021-05-29 ENCOUNTER — Other Ambulatory Visit: Payer: Self-pay | Admitting: Radiology

## 2021-05-29 ENCOUNTER — Inpatient Hospital Stay (HOSPITAL_BASED_OUTPATIENT_CLINIC_OR_DEPARTMENT_OTHER): Payer: BC Managed Care – PPO | Admitting: Oncology

## 2021-05-29 ENCOUNTER — Other Ambulatory Visit: Payer: Self-pay

## 2021-05-29 VITALS — BP 128/83 | HR 68 | Temp 98.3°F | Resp 17 | Ht 66.0 in | Wt 256.3 lb

## 2021-05-29 DIAGNOSIS — C649 Malignant neoplasm of unspecified kidney, except renal pelvis: Secondary | ICD-10-CM

## 2021-05-29 DIAGNOSIS — Z880 Allergy status to penicillin: Secondary | ICD-10-CM | POA: Diagnosis not present

## 2021-05-29 DIAGNOSIS — C641 Malignant neoplasm of right kidney, except renal pelvis: Secondary | ICD-10-CM | POA: Diagnosis not present

## 2021-05-29 DIAGNOSIS — Z7952 Long term (current) use of systemic steroids: Secondary | ICD-10-CM | POA: Diagnosis not present

## 2021-05-29 DIAGNOSIS — Z905 Acquired absence of kidney: Secondary | ICD-10-CM | POA: Diagnosis not present

## 2021-05-29 DIAGNOSIS — K754 Autoimmune hepatitis: Secondary | ICD-10-CM | POA: Diagnosis not present

## 2021-05-29 DIAGNOSIS — C7800 Secondary malignant neoplasm of unspecified lung: Secondary | ICD-10-CM | POA: Diagnosis not present

## 2021-05-29 DIAGNOSIS — R7989 Other specified abnormal findings of blood chemistry: Secondary | ICD-10-CM | POA: Diagnosis not present

## 2021-05-29 DIAGNOSIS — E039 Hypothyroidism, unspecified: Secondary | ICD-10-CM | POA: Diagnosis not present

## 2021-05-29 DIAGNOSIS — Z79899 Other long term (current) drug therapy: Secondary | ICD-10-CM | POA: Diagnosis not present

## 2021-05-29 MED ORDER — LEVOFLOXACIN IN D5W 500 MG/100ML IV SOLN
500.0000 mg | INTRAVENOUS | Status: AC
  Administered 2021-05-30: 500 mg via INTRAVENOUS
  Filled 2021-05-29: qty 100

## 2021-05-29 NOTE — Anesthesia Preprocedure Evaluation (Addendum)
Anesthesia Evaluation  Patient identified by MRN, date of birth, ID band Patient awake    Reviewed: Allergy & Precautions, NPO status , Patient's Chart, lab work & pertinent test results  Airway Mallampati: III  TM Distance: >3 FB Neck ROM: Full    Dental no notable dental hx. (+) Teeth Intact, Dental Advisory Given   Pulmonary sleep apnea and Continuous Positive Airway Pressure Ventilation ,    Pulmonary exam normal breath sounds clear to auscultation       Cardiovascular hypertension, Normal cardiovascular exam Rhythm:Regular Rate:Normal     Neuro/Psych negative neurological ROS  negative psych ROS   GI/Hepatic HCC ca   Endo/Other  negative endocrine ROSHypothyroidism Morbid obesity (BMI 41.37)  Renal/GU Lab Results      Component                Value               Date                      CREATININE               1.26 (H)            05/11/2021                BUN                      17                  05/11/2021                NA                       140                 05/11/2021                K                        4.2                 05/11/2021                CL                       104                 05/11/2021                CO2                      26                  05/11/2021                Musculoskeletal negative musculoskeletal ROS (+)   Abdominal (+) + obese,   Peds  Hematology Lab Results      Component                Value               Date                      WBC  5.8                 05/11/2021                HGB                      15.4                05/11/2021                HCT                      44.9                05/11/2021                MCV                      86.7                05/11/2021                PLT                      254                 05/11/2021              Anesthesia Other Findings All PCN  Reproductive/Obstetrics                             Anesthesia Physical Anesthesia Plan  ASA: 3  Anesthesia Plan: General   Post-op Pain Management:    Induction: Intravenous  PONV Risk Score and Plan: 3 and Treatment may vary due to age or medical condition, Midazolam, Ondansetron and Dexamethasone  Airway Management Planned: Oral ETT  Additional Equipment: None  Intra-op Plan:   Post-operative Plan: Extubation in OR  Informed Consent: I have reviewed the patients History and Physical, chart, labs and discussed the procedure including the risks, benefits and alternatives for the proposed anesthesia with the patient or authorized representative who has indicated his/her understanding and acceptance.     Dental advisory given  Plan Discussed with: CRNA and Anesthesiologist  Anesthesia Plan Comments:        Anesthesia Quick Evaluation

## 2021-05-29 NOTE — Progress Notes (Signed)
Hematology and Oncology Follow Up Visit  Michael [name] [id number] September 09, 1966 55 y.o. 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  He developed pulmonary metastasis shortly after his surgical resection.    Secondary diagnosis: Left kidney tumors noted in May 2022.  He was found to have 2.8 x 2.8 x 2.7 mm lesion measuring 2.1 x 2.0 x 1.6.   Prior Therapy: He underwent a right nephrectomy on August 27, 2019 performed while he was living in [place].  The final pathology showed clear cell renal cell carcinoma, nuclear grade 3 with 10% necrosis negative margins.  Final pathological stage was T2ANX.  He is status post Pembrolizumab and axitinib started in March 2021.  He received two cycles of therapy.  Therapy was interrupted because of increased LFTs and autoimmune hepatitis.    He has been receiving his therapy in [place] and relocated to this area.  Axitinib 5 mg daily started on July 05, 2020.  Therapy discontinued on July 13, 2020 because of elevated LFTs.  Current therapy: Under consideration for percutaneous ablation of his left kidney tumors.  Interim History: Michael [name] returns today for repeat follow-up.  Since her last visit, he reports feeling well without any major complaints.  He denies any nausea, vomiting or abdominal pain.  He denies any frequency or urgency.  He denies any urination difficulties.  He remains active and continues to attempt activities of daily living.    Medications: Reviewed without changes. Current Outpatient Medications  Medication Sig Dispense Refill   amLODipine (NORVASC) 5 MG tablet Take 7.5 mg by mouth in the morning.     cholecalciferol (VITAMIN D3) 25 MCG (1000 UNIT) tablet Take 1,000 Units by mouth in the morning.     levothyroxine (SYNTHROID) 100 MCG tablet TAKE 1 TABLET(100 MCG) BY MOUTH DAILY BEFORE BREAKFAST  (Patient taking differently: Take 100 mcg by mouth in the morning.) 90 tablet 0   montelukast (SINGULAIR) 10 MG tablet Take 10 mg by mouth in the morning.     naphazoline-pheniramine (ALLERGY EYE) 0.025-0.3 % ophthalmic solution Place 1 drop into both eyes 2 (two) times daily as needed for eye irritation or allergies.     predniSONE (STERAPRED UNI-PAK 21 TAB) 5 MG (21) TBPK tablet Take 5-30 mg by mouth as directed. Take these number of tablets 6-5-4-3-2-1 on consecutive days until finished     tadalafil (CIALIS) 20 MG tablet Take 10-20 mg by mouth daily as needed for erectile dysfunction.     No current facility-administered medications for this visit.     Allergies:  Allergies  Allergen Reactions   Penicillins Other (See Comments)    Childhood allergies- unk      Physical Exam:      Blood pressure 128/83, pulse 68, temperature 98.3 F (36.8 C), temperature source Oral, resp. rate 17, height '5\' 6"'$  (1.676 m), weight 256 lb 4.8 oz (116.3 kg), SpO2 99 %.    ECOG: 0    General appearance: Alert, awake without any distress. Head: Atraumatic without abnormalities Oropharynx: Without any thrush or ulcers. Eyes: No scleral icterus. Lymph nodes: No lymphadenopathy noted in the cervical, supraclavicular, or axillary nodes Heart:regular rate and rhythm, without any murmurs or gallops.   Lung: Clear to auscultation without any rhonchi, wheezes or dullness to percussion. Abdomin: Soft, nontender without any shifting dullness or ascites. Musculoskeletal: No clubbing or cyanosis. Neurological: No motor or sensory deficits.  Skin: No rashes or lesions.              Lab Results: Lab Results  Component Value Date   WBC 5.8 05/11/2021   HGB 15.4 05/11/2021   HCT 44.9 05/11/2021   MCV 86.7 05/11/2021   PLT 254 05/11/2021     Chemistry      Component Value Date/Time   NA 140 05/11/2021 1105   K 4.2 05/11/2021 1105   CL 104 05/11/2021 1105   CO2 26 05/11/2021 1105    BUN 17 05/11/2021 1105   CREATININE 1.26 (H) 05/11/2021 1105      Component Value Date/Time   CALCIUM 9.0 05/11/2021 1105   ALKPHOS 112 05/11/2021 1105   AST 23 05/11/2021 1105   ALT 24 05/11/2021 1105   BILITOT 0.7 05/11/2021 1105              Impression and Plan:  55 year old with:   1.  Stage IV clear-cell renal cell carcinoma of the right kidney with pulmonary involvement.  Treatment choices were discussed at this time.  He had an excellent response to Pembrolizumab and axitinib with regression of his pulmonary nodules and currently off treatment.  He developed increase in his liver function tests with axitinib.  Risks and benefits of restarting Pembrolizumab versus alternative treatment option were discussed at this time.  Active surveillance could also be considered but is concerning given his left kidney tumors.  After discussion today, we will proceed with treating his left kidney tumor and will update his staging scans in September and will consider restarting Pembrolizumab at that time.     2.  Hypothyroidism: He is currently on the thyroid replacement with TSH close to normal range.   3.    Left kidney mass: He is under consideration for percutaneous ablation.  His case was discussed extensively with interventional radiology as well as Dr. Latanya Presser whom he cared for previously.   4.  Goals of care and prognosis: Therapy remains palliative although aggressive measures are warranted given his reasonable performance status.   5.  Elevated AST and ALT: Resolved after axitinib discontinuation.   6.  Follow-up: In 2 months for repeat follow-up.   30  minutes were dedicated to this visit.  The time was spent on reviewing imaging studies, discussing his case with multidisciplinary team including interventional radiology as well as other medical oncology specialists.    Zola Button, MD 7/26/20228:23 AM

## 2021-05-30 ENCOUNTER — Ambulatory Visit (HOSPITAL_COMMUNITY): Payer: BC Managed Care – PPO | Admitting: Anesthesiology

## 2021-05-30 ENCOUNTER — Other Ambulatory Visit: Payer: Self-pay

## 2021-05-30 ENCOUNTER — Encounter (HOSPITAL_COMMUNITY)
Admission: RE | Disposition: A | Discharge status: Home / Self Care | Payer: Self-pay | Source: Ambulatory Visit | Attending: Interventional Radiology

## 2021-05-30 ENCOUNTER — Encounter (HOSPITAL_COMMUNITY): Payer: Self-pay | Admitting: Interventional Radiology

## 2021-05-30 ENCOUNTER — Observation Stay (HOSPITAL_COMMUNITY)
Admission: RE | Admit: 2021-05-30 | Discharge: 2021-05-31 | Disposition: A | Discharge status: Home / Self Care | Payer: BC Managed Care – PPO | Source: Ambulatory Visit | Attending: Interventional Radiology | Admitting: Interventional Radiology

## 2021-05-30 ENCOUNTER — Observation Stay (HOSPITAL_COMMUNITY)
Admission: RE | Admit: 2021-05-30 | Discharge: 2021-05-30 | Disposition: A | Discharge status: Home / Self Care | Payer: BC Managed Care – PPO | Source: Ambulatory Visit | Attending: Interventional Radiology | Admitting: Interventional Radiology

## 2021-05-30 ENCOUNTER — Encounter (HOSPITAL_COMMUNITY): Payer: Self-pay

## 2021-05-30 DIAGNOSIS — Z79890 Hormone replacement therapy (postmenopausal): Secondary | ICD-10-CM | POA: Insufficient documentation

## 2021-05-30 DIAGNOSIS — N2889 Other specified disorders of kidney and ureter: Secondary | ICD-10-CM | POA: Diagnosis not present

## 2021-05-30 DIAGNOSIS — Z79899 Other long term (current) drug therapy: Secondary | ICD-10-CM | POA: Insufficient documentation

## 2021-05-30 DIAGNOSIS — G4733 Obstructive sleep apnea (adult) (pediatric): Secondary | ICD-10-CM | POA: Insufficient documentation

## 2021-05-30 DIAGNOSIS — I10 Essential (primary) hypertension: Secondary | ICD-10-CM | POA: Insufficient documentation

## 2021-05-30 DIAGNOSIS — Z880 Allergy status to penicillin: Secondary | ICD-10-CM | POA: Insufficient documentation

## 2021-05-30 DIAGNOSIS — E039 Hypothyroidism, unspecified: Secondary | ICD-10-CM | POA: Diagnosis not present

## 2021-05-30 DIAGNOSIS — C642 Malignant neoplasm of left kidney, except renal pelvis: Secondary | ICD-10-CM | POA: Diagnosis not present

## 2021-05-30 DIAGNOSIS — Z9989 Dependence on other enabling machines and devices: Secondary | ICD-10-CM | POA: Diagnosis not present

## 2021-05-30 HISTORY — PX: RADIOLOGY WITH ANESTHESIA: SHX6223

## 2021-05-30 LAB — CBC WITH DIFFERENTIAL/PLATELET
Abs Immature Granulocytes: 0.03 10*3/uL (ref 0.00–0.07)
Basophils Absolute: 0 10*3/uL (ref 0.0–0.1)
Basophils Relative: 0 %
Eosinophils Absolute: 0.2 10*3/uL (ref 0.0–0.5)
Eosinophils Relative: 2 %
HCT: 45 % (ref 39.0–52.0)
Hemoglobin: 15.2 g/dL (ref 13.0–17.0)
Immature Granulocytes: 0 %
Lymphocytes Relative: 29 %
Lymphs Abs: 2 10*3/uL (ref 0.7–4.0)
MCH: 29.6 pg (ref 26.0–34.0)
MCHC: 33.8 g/dL (ref 30.0–36.0)
MCV: 87.5 fL (ref 80.0–100.0)
Monocytes Absolute: 0.4 10*3/uL (ref 0.1–1.0)
Monocytes Relative: 6 %
Neutro Abs: 4.3 10*3/uL (ref 1.7–7.7)
Neutrophils Relative %: 63 %
Platelets: 239 10*3/uL (ref 150–400)
RBC: 5.14 MIL/uL (ref 4.22–5.81)
RDW: 13.2 % (ref 11.5–15.5)
WBC: 6.9 10*3/uL (ref 4.0–10.5)
nRBC: 0 % (ref 0.0–0.2)

## 2021-05-30 LAB — PROTIME-INR
INR: 0.9 (ref 0.8–1.2)
Prothrombin Time: 12.3 seconds (ref 11.4–15.2)

## 2021-05-30 LAB — BASIC METABOLIC PANEL
Anion gap: 11 (ref 5–15)
BUN: 19 mg/dL (ref 6–20)
CO2: 26 mmol/L (ref 22–32)
Calcium: 9.2 mg/dL (ref 8.9–10.3)
Chloride: 104 mmol/L (ref 98–111)
Creatinine, Ser: 1.2 mg/dL (ref 0.61–1.24)
GFR, Estimated: >60 mL/min (ref >60)
Glucose, Bld: 103 mg/dL — ABNORMAL HIGH (ref 70–99)
Potassium: 3.9 mmol/L (ref 3.5–5.1)
Sodium: 141 mmol/L (ref 135–145)

## 2021-05-30 LAB — TYPE AND SCREEN
ABO/RH(D): B POS
Antibody Screen: NEGATIVE

## 2021-05-30 LAB — ABO/RH: ABO/RH(D): B POS

## 2021-05-30 IMAGING — CT CT BIOPSY
1 of 12 series · 6 of 32 positions shown, 11 images · non-contrast
Comparison: none

CLINICAL DATA: History of prior right nephrectomy for clear cell
renal carcinoma. Enlarging exophytic interpolar mass of the left
kidney measuring approximately 2.8 cm in maximal diameter and
additional deeper renal mass near the renal sinus measuring
approximately 2.1 cm in greatest diameter by prior imaging. The
patient presents for planned biopsy and cryoablation of the larger
mass and biopsy of the deeper, smaller mass.

[Series 4: i-spiral 5.0 bf37 · axial · 0.98mm/px · z∈[-168,-28]mm · 6 of 56 slices shown, 11 images]
[im 8/56  soft-tissue]
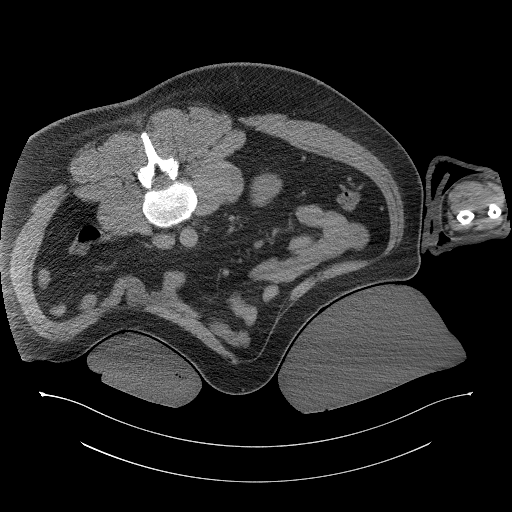
[im 8/56  bone]
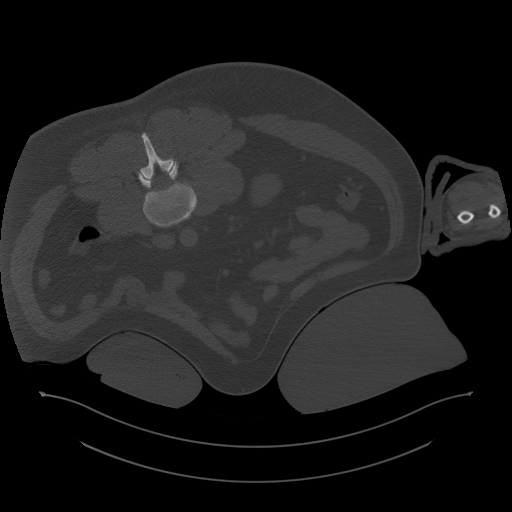
[im 16/56  soft-tissue]
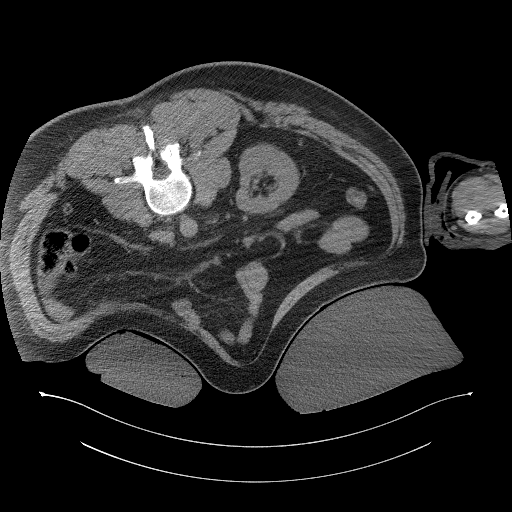
[im 24/56  soft-tissue]
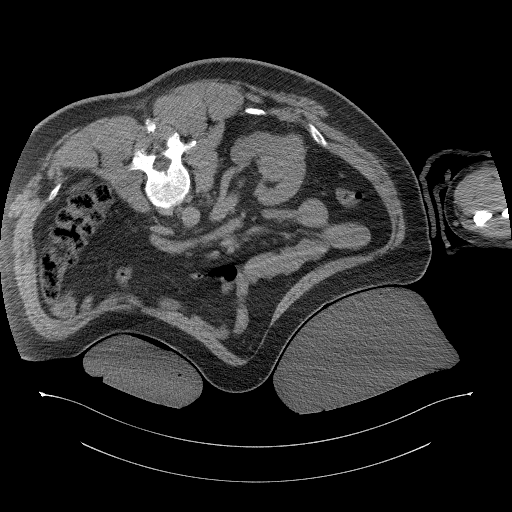
[im 24/56  lung]
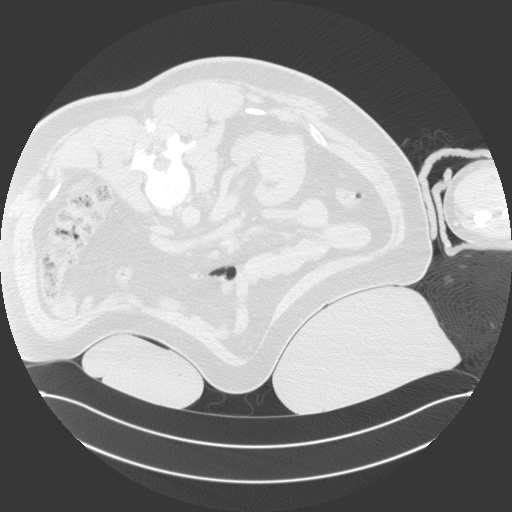
[im 32/56  soft-tissue]
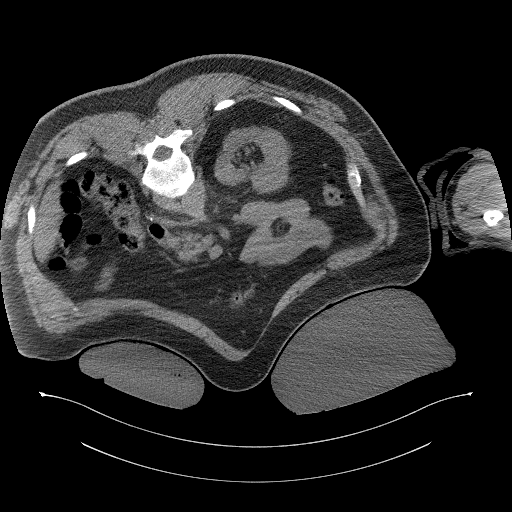
[im 32/56  lung]
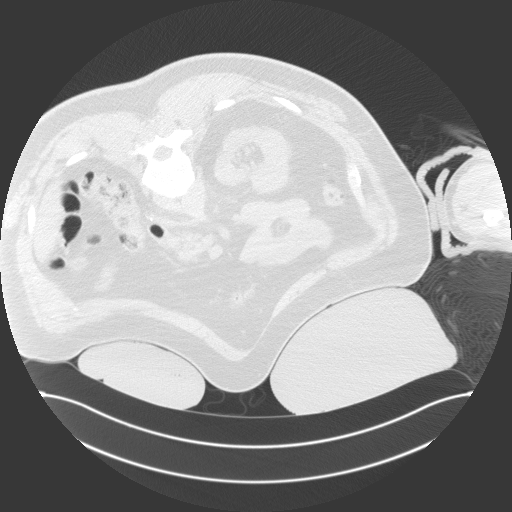
[im 40/56  soft-tissue]
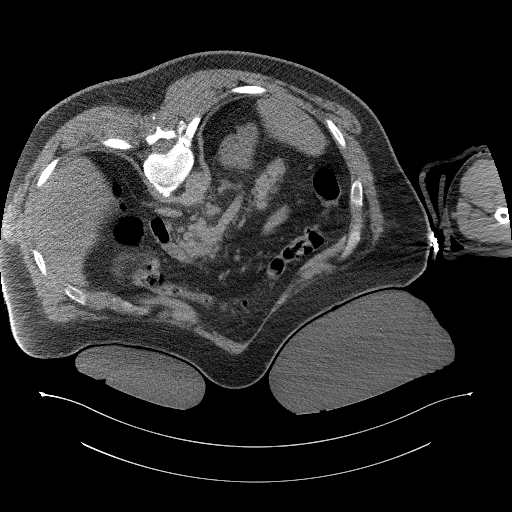
[im 40/56  lung]
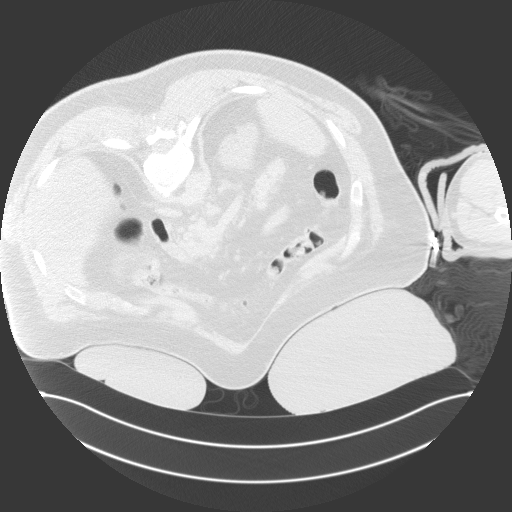
[im 48/56  soft-tissue]
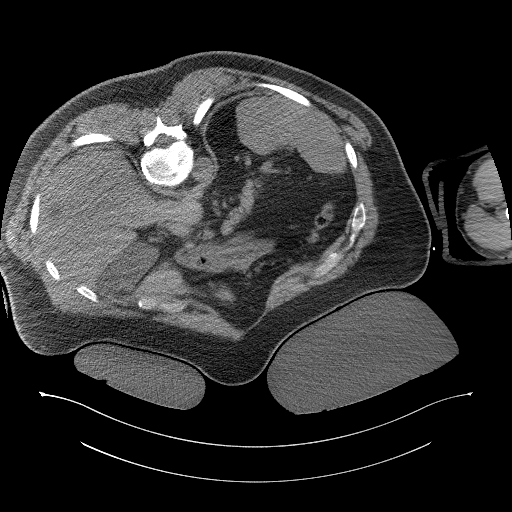
[im 48/56  lung]
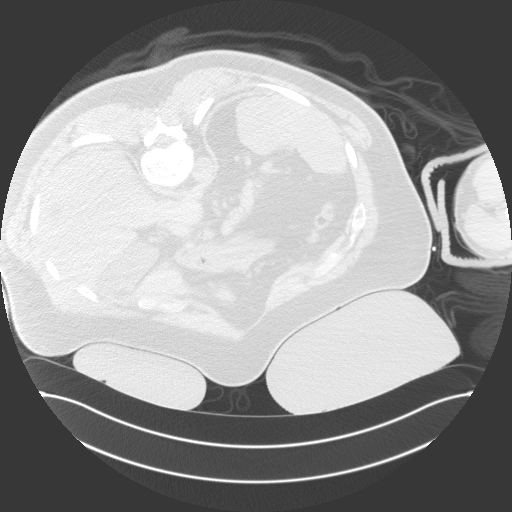

[6 of 32 positions shown; findings below may reference images not displayed]

EXAM:
1. CT-GUIDED CORE BIOPSY LATERAL LEFT RENAL MASS
2. CT-GUIDED CORE BIOPSY OF MEDIAL LEFT RENAL SINUS MASS
3. CT-GUIDED PERCUTANEOUS CRYOABLATION OF LATERAL LEFT RENAL MASS

ANESTHESIA/SEDATION:
General

MEDICATIONS:
500 mg IV Levaquin. The antibiotic was administered in an
appropriate time interval prior to needle puncture of the skin.

CONTRAST:  None

PROCEDURE:
The procedure, risks, benefits, and alternatives were explained to
the patient. Questions regarding the procedure were encouraged and
answered. The patient understands and consents to the procedure. A
time-out was performed prior to initiating the procedure.

The patient was placed under general anesthesia. Initial unenhanced
CT was performed in a prone position to localize the left kidney.
Ultrasound was also performed to localize left renal masses.

The left flank region was prepped with chlorhexidine in a sterile
fashion, and a sterile drape was applied covering the operative
field. A sterile gown and sterile gloves were used for the
procedure.

Under CT and ultrasound guidance, 3 separate Ice RTOYOTA cryoablation
probes were advanced into the lateral interpolar left renal mass.
Probe positioning was confirmed by CT prior to cryoablation.

Separate 17 gauge trocar needles were then positioned along the
outer margins of a lateral left interpolar renal mass and a deeper
interpolar mass adjacent to the renal sinus. After confirming needle
tip positioning, separate 18 gauge core biopsy samples were obtained
of both renal masses and submitted separately in formalin. Gel-Foam
pledgets were advanced through each of the outer 17 gauge needles
prior to their removal.

Cryoablation was then performed through the 3 probes simultaneously.
Initial 10 minute cycle of cryoablation was performed. This was
followed by a 8 minute thaw cycle. A second 10 minute cycle of
cryoablation was then performed. During ablation, periodic CT
imaging was performed to monitor ice ball formation and morphology.
After active thaw and a 30 second tract cautery cycle, the
cryoablation probes were removed.

COMPLICATIONS:
None
FINDINGS: By ultrasound, the lateral interpolar left renal mass which is
partially exophytic measures approximately 3.2 cm in greatest
diameter and is round in morphology. The deeper mass adjacent to the
renal sinus measures approximately 2.2-2.4 cm in greatest diameter
and is more oval in shape. Solid tissue biopsies were obtained of
each lesion.

During cryoablation of the larger 3.2 cm left renal mass, adequate
ice ball formation was identified by CT encompassing the mass.
During the procedure, a small amount perinephric hemorrhage
developed lateral to the larger mass which did not increase
significantly.
IMPRESSION: 1. CT-guided core biopsy of 3.2 cm lateral exophytic interpolar left
renal mass.
2. CT-guided core biopsy of 2.2-2.4 cm medial left renal sinus mass.
3. CT-guided cryoablation of 3.2 cm interpolar left renal mass.

## 2021-05-30 IMAGING — CT CT BIOPSY
1 of 12 series · 6 of 32 positions shown, 11 images · non-contrast
Comparison: none

CLINICAL DATA: History of prior right nephrectomy for clear cell
renal carcinoma. Enlarging exophytic interpolar mass of the left
kidney measuring approximately 2.8 cm in maximal diameter and
additional deeper renal mass near the renal sinus measuring
approximately 2.1 cm in greatest diameter by prior imaging. The
patient presents for planned biopsy and cryoablation of the larger
mass and biopsy of the deeper, smaller mass.

[Series 4: i-spiral 5.0 bf37 · axial · 0.98mm/px · z∈[-168,-28]mm · 6 of 56 slices shown, 11 images]
[im 8/56  soft-tissue]
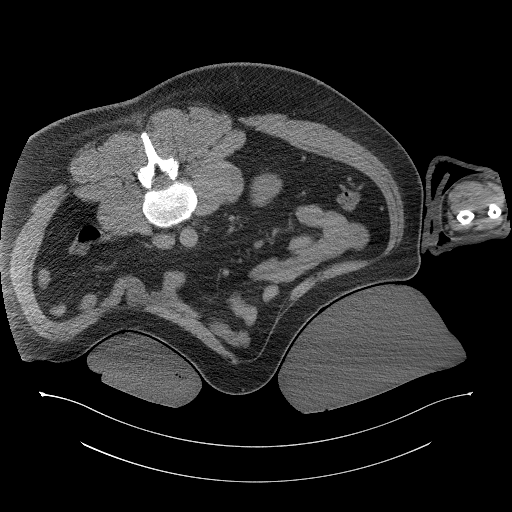
[im 8/56  bone]
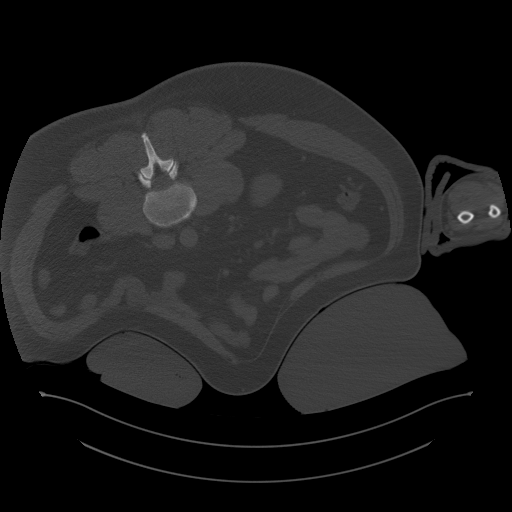
[im 16/56  soft-tissue]
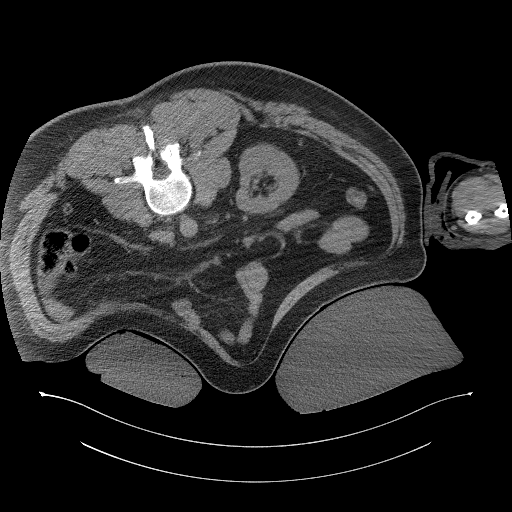
[im 24/56  soft-tissue]
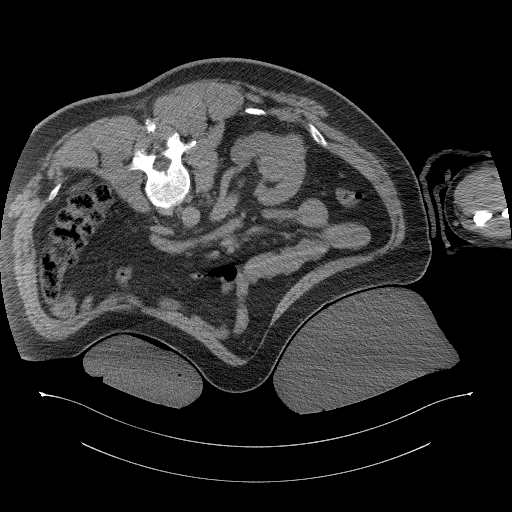
[im 24/56  lung]
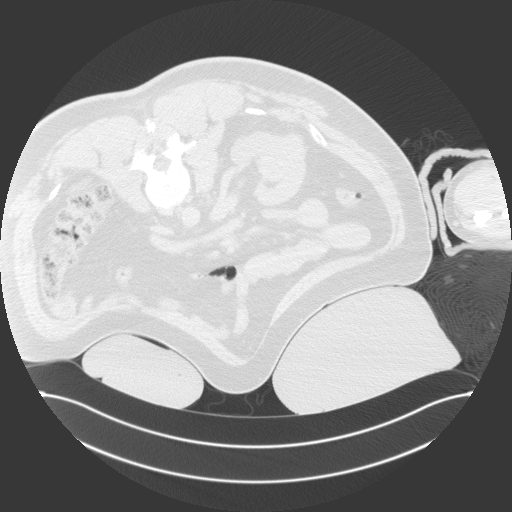
[im 32/56  soft-tissue]
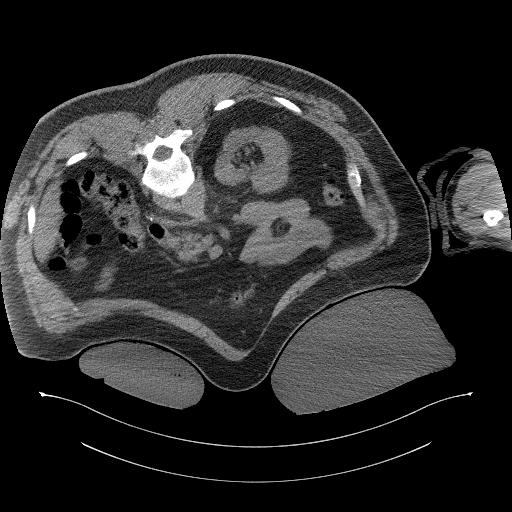
[im 32/56  lung]
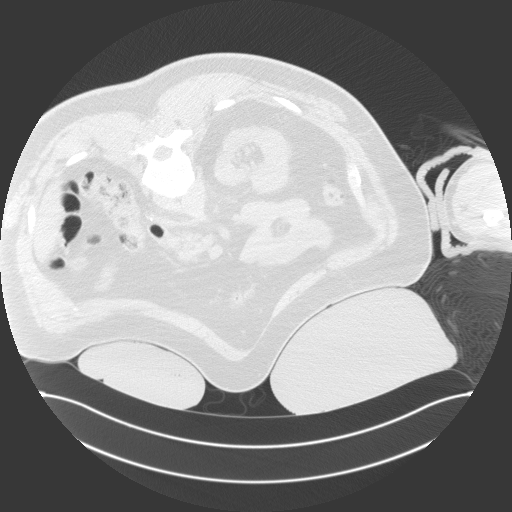
[im 40/56  soft-tissue]
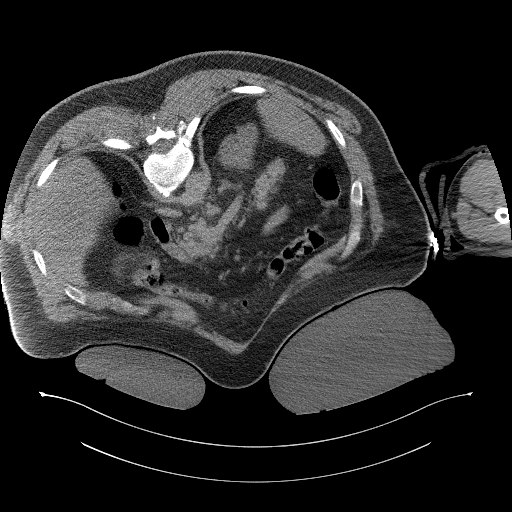
[im 40/56  lung]
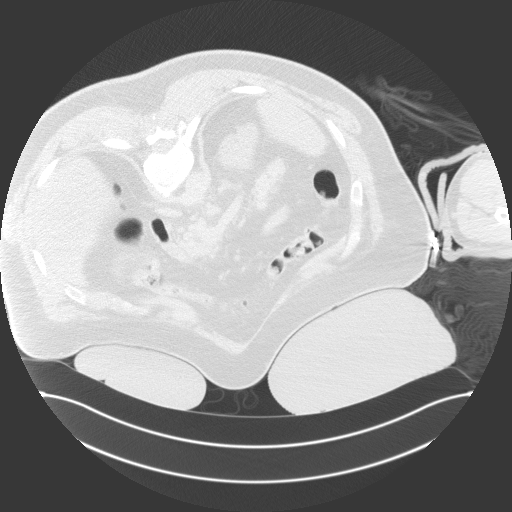
[im 48/56  soft-tissue]
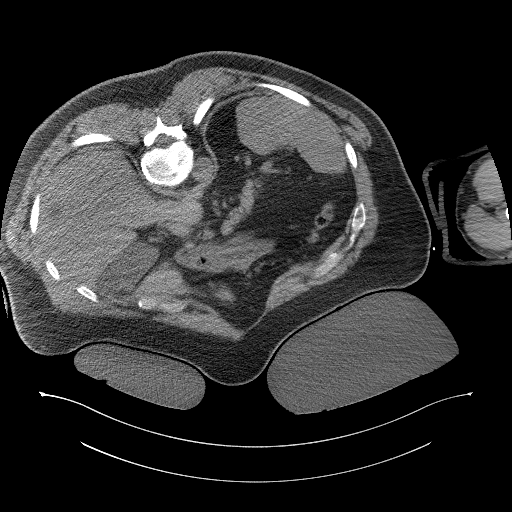
[im 48/56  lung]
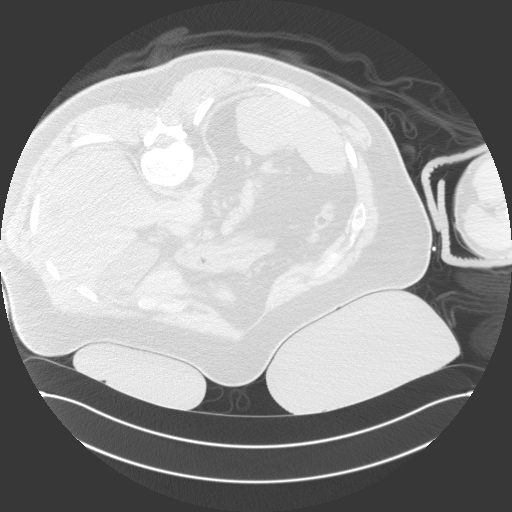

[6 of 32 positions shown; findings below may reference images not displayed]

EXAM:
1. CT-GUIDED CORE BIOPSY LATERAL LEFT RENAL MASS
2. CT-GUIDED CORE BIOPSY OF MEDIAL LEFT RENAL SINUS MASS
3. CT-GUIDED PERCUTANEOUS CRYOABLATION OF LATERAL LEFT RENAL MASS

ANESTHESIA/SEDATION:
General

MEDICATIONS:
500 mg IV Levaquin. The antibiotic was administered in an
appropriate time interval prior to needle puncture of the skin.

CONTRAST:  None

PROCEDURE:
The procedure, risks, benefits, and alternatives were explained to
the patient. Questions regarding the procedure were encouraged and
answered. The patient understands and consents to the procedure. A
time-out was performed prior to initiating the procedure.

The patient was placed under general anesthesia. Initial unenhanced
CT was performed in a prone position to localize the left kidney.
Ultrasound was also performed to localize left renal masses.

The left flank region was prepped with chlorhexidine in a sterile
fashion, and a sterile drape was applied covering the operative
field. A sterile gown and sterile gloves were used for the
procedure.

Under CT and ultrasound guidance, 3 separate Ice RTOYOTA cryoablation
probes were advanced into the lateral interpolar left renal mass.
Probe positioning was confirmed by CT prior to cryoablation.

Separate 17 gauge trocar needles were then positioned along the
outer margins of a lateral left interpolar renal mass and a deeper
interpolar mass adjacent to the renal sinus. After confirming needle
tip positioning, separate 18 gauge core biopsy samples were obtained
of both renal masses and submitted separately in formalin. Gel-Foam
pledgets were advanced through each of the outer 17 gauge needles
prior to their removal.

Cryoablation was then performed through the 3 probes simultaneously.
Initial 10 minute cycle of cryoablation was performed. This was
followed by a 8 minute thaw cycle. A second 10 minute cycle of
cryoablation was then performed. During ablation, periodic CT
imaging was performed to monitor ice ball formation and morphology.
After active thaw and a 30 second tract cautery cycle, the
cryoablation probes were removed.

COMPLICATIONS:
None
FINDINGS: By ultrasound, the lateral interpolar left renal mass which is
partially exophytic measures approximately 3.2 cm in greatest
diameter and is round in morphology. The deeper mass adjacent to the
renal sinus measures approximately 2.2-2.4 cm in greatest diameter
and is more oval in shape. Solid tissue biopsies were obtained of
each lesion.

During cryoablation of the larger 3.2 cm left renal mass, adequate
ice ball formation was identified by CT encompassing the mass.
During the procedure, a small amount perinephric hemorrhage
developed lateral to the larger mass which did not increase
significantly.
IMPRESSION: 1. CT-guided core biopsy of 3.2 cm lateral exophytic interpolar left
renal mass.
2. CT-guided core biopsy of 2.2-2.4 cm medial left renal sinus mass.
3. CT-guided cryoablation of 3.2 cm interpolar left renal mass.

## 2021-05-30 IMAGING — CT CT GUIDANCE TISSUE ABLATION
1 of 12 series · 6 of 32 positions shown, 11 images · non-contrast
Comparison: none

CLINICAL DATA: History of prior right nephrectomy for clear cell
renal carcinoma. Enlarging exophytic interpolar mass of the left
kidney measuring approximately 2.8 cm in maximal diameter and
additional deeper renal mass near the renal sinus measuring
approximately 2.1 cm in greatest diameter by prior imaging. The
patient presents for planned biopsy and cryoablation of the larger
mass and biopsy of the deeper, smaller mass.

[Series 4: i-spiral 5.0 bf37 · axial · 0.98mm/px · z∈[-168,-28]mm · 6 of 56 slices shown, 11 images]
[im 8/56  soft-tissue]
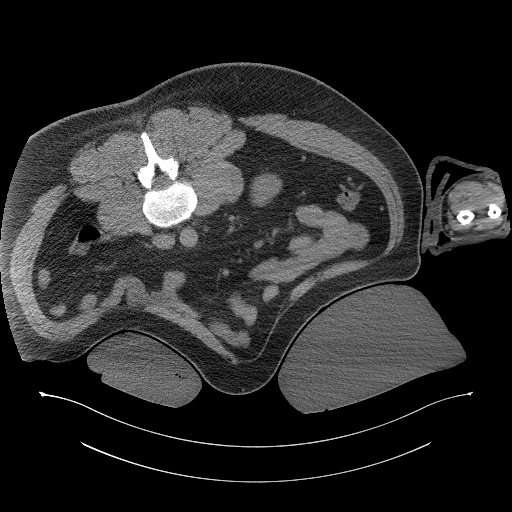
[im 8/56  bone]
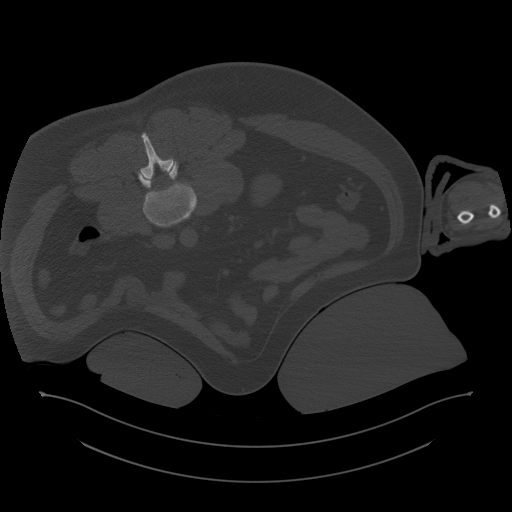
[im 16/56  soft-tissue]
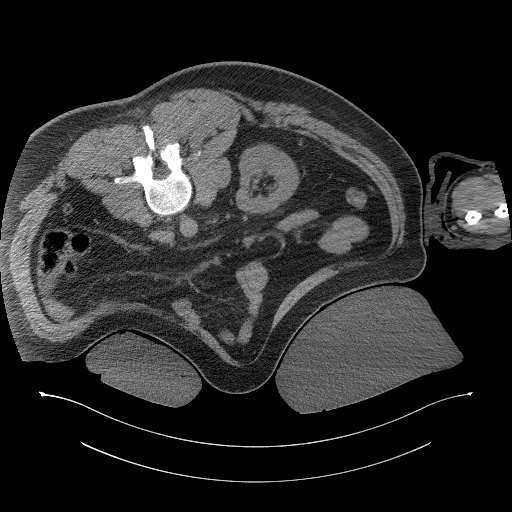
[im 24/56  soft-tissue]
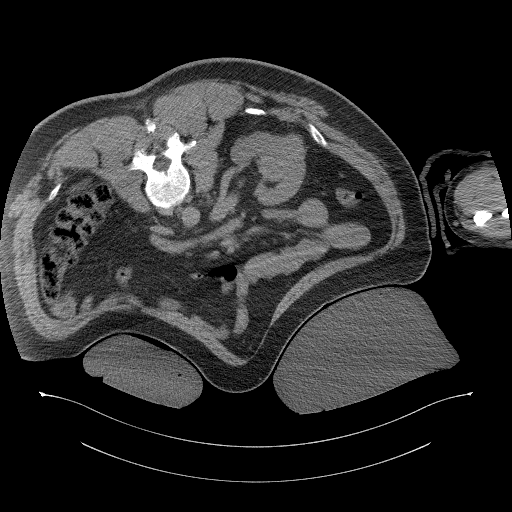
[im 24/56  lung]
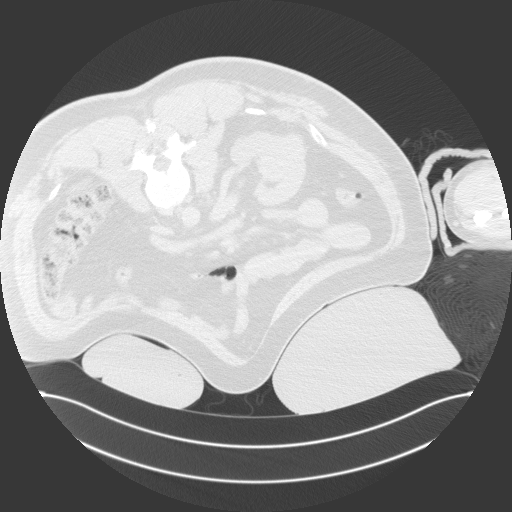
[im 32/56  soft-tissue]
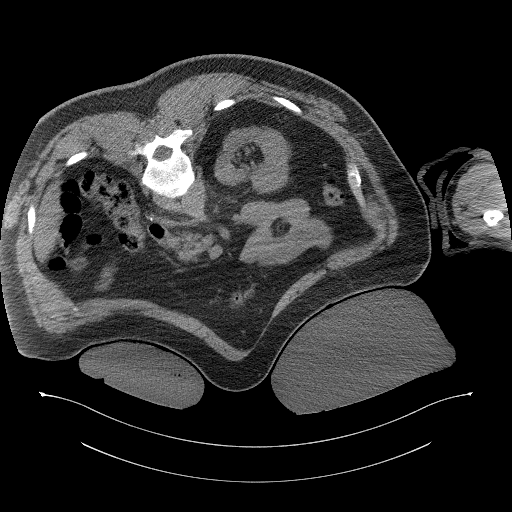
[im 32/56  lung]
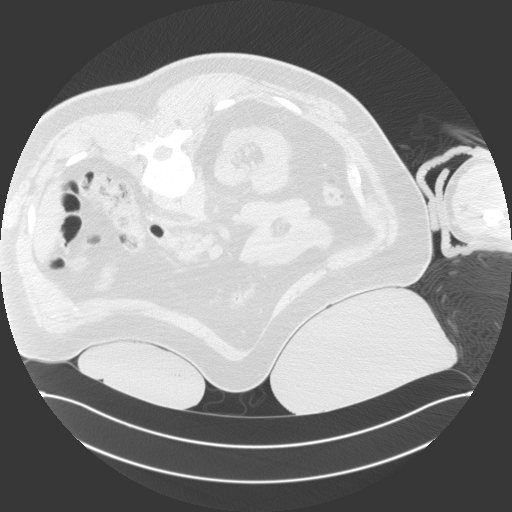
[im 40/56  soft-tissue]
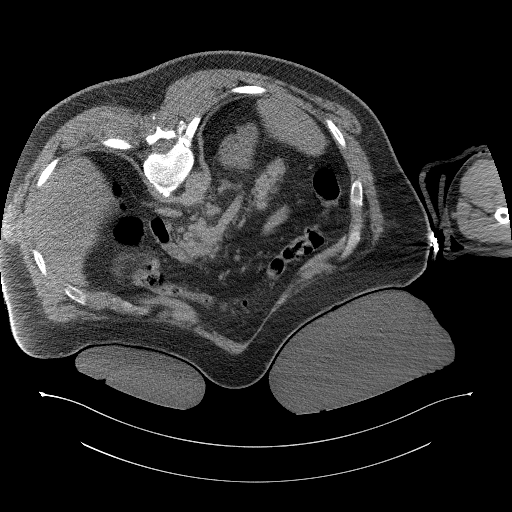
[im 40/56  lung]
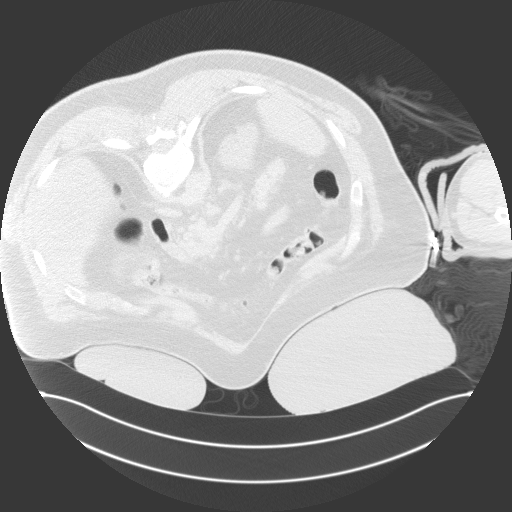
[im 48/56  soft-tissue]
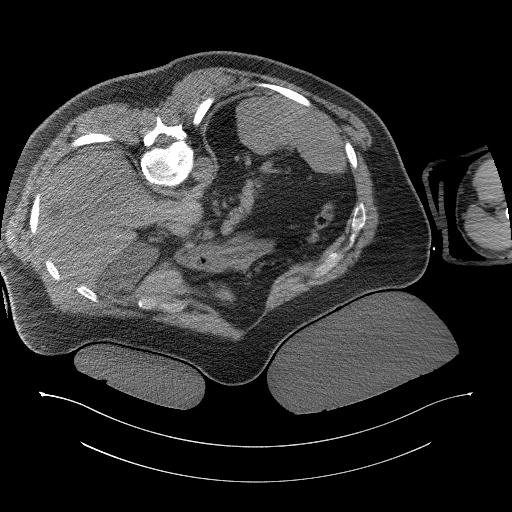
[im 48/56  lung]
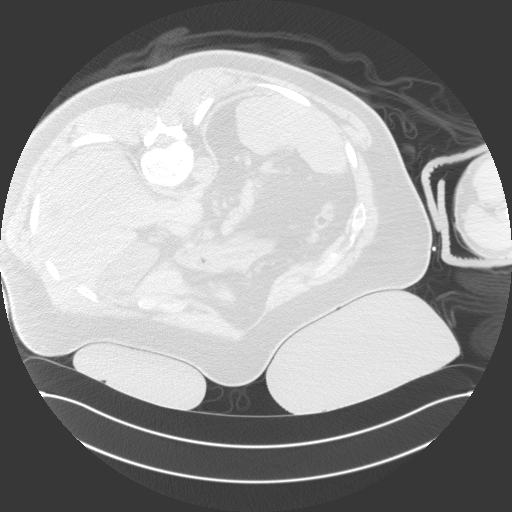

[6 of 32 positions shown; findings below may reference images not displayed]

EXAM:
1. CT-GUIDED CORE BIOPSY LATERAL LEFT RENAL MASS
2. CT-GUIDED CORE BIOPSY OF MEDIAL LEFT RENAL SINUS MASS
3. CT-GUIDED PERCUTANEOUS CRYOABLATION OF LATERAL LEFT RENAL MASS

ANESTHESIA/SEDATION:
General

MEDICATIONS:
500 mg IV Levaquin. The antibiotic was administered in an
appropriate time interval prior to needle puncture of the skin.

CONTRAST:  None

PROCEDURE:
The procedure, risks, benefits, and alternatives were explained to
the patient. Questions regarding the procedure were encouraged and
answered. The patient understands and consents to the procedure. A
time-out was performed prior to initiating the procedure.

The patient was placed under general anesthesia. Initial unenhanced
CT was performed in a prone position to localize the left kidney.
Ultrasound was also performed to localize left renal masses.

The left flank region was prepped with chlorhexidine in a sterile
fashion, and a sterile drape was applied covering the operative
field. A sterile gown and sterile gloves were used for the
procedure.

Under CT and ultrasound guidance, 3 separate Ice RTOYOTA cryoablation
probes were advanced into the lateral interpolar left renal mass.
Probe positioning was confirmed by CT prior to cryoablation.

Separate 17 gauge trocar needles were then positioned along the
outer margins of a lateral left interpolar renal mass and a deeper
interpolar mass adjacent to the renal sinus. After confirming needle
tip positioning, separate 18 gauge core biopsy samples were obtained
of both renal masses and submitted separately in formalin. Gel-Foam
pledgets were advanced through each of the outer 17 gauge needles
prior to their removal.

Cryoablation was then performed through the 3 probes simultaneously.
Initial 10 minute cycle of cryoablation was performed. This was
followed by a 8 minute thaw cycle. A second 10 minute cycle of
cryoablation was then performed. During ablation, periodic CT
imaging was performed to monitor ice ball formation and morphology.
After active thaw and a 30 second tract cautery cycle, the
cryoablation probes were removed.

COMPLICATIONS:
None
FINDINGS: By ultrasound, the lateral interpolar left renal mass which is
partially exophytic measures approximately 3.2 cm in greatest
diameter and is round in morphology. The deeper mass adjacent to the
renal sinus measures approximately 2.2-2.4 cm in greatest diameter
and is more oval in shape. Solid tissue biopsies were obtained of
each lesion.

During cryoablation of the larger 3.2 cm left renal mass, adequate
ice ball formation was identified by CT encompassing the mass.
During the procedure, a small amount perinephric hemorrhage
developed lateral to the larger mass which did not increase
significantly.
IMPRESSION: 1. CT-guided core biopsy of 3.2 cm lateral exophytic interpolar left
renal mass.
2. CT-guided core biopsy of 2.2-2.4 cm medial left renal sinus mass.
3. CT-guided cryoablation of 3.2 cm interpolar left renal mass.

## 2021-05-30 SURGERY — CT WITH ANESTHESIA
Anesthesia: General

## 2021-05-30 MED ORDER — SUGAMMADEX SODIUM 200 MG/2ML IV SOLN
INTRAVENOUS | Status: DC | PRN
  Administered 2021-05-30: 250 mg via INTRAVENOUS

## 2021-05-30 MED ORDER — DEXAMETHASONE SODIUM PHOSPHATE 10 MG/ML IJ SOLN
INTRAMUSCULAR | Status: DC | PRN
Start: 2021-05-30 — End: 2021-05-30
  Administered 2021-05-30: 8 mg via INTRAVENOUS

## 2021-05-30 MED ORDER — HYDROCODONE-ACETAMINOPHEN 5-325 MG PO TABS: 1.0000 | ORAL_TABLET | ORAL | Status: DC | PRN

## 2021-05-30 MED ORDER — ACETAMINOPHEN 10 MG/ML IV SOLN: 1000.0000 mg | Freq: Once | INTRAVENOUS | Status: DC | PRN

## 2021-05-30 MED ORDER — SENNOSIDES-DOCUSATE SODIUM 8.6-50 MG PO TABS: 1.0000 | ORAL_TABLET | Freq: Every day | ORAL | Status: DC | PRN

## 2021-05-30 MED ORDER — SODIUM CHLORIDE 0.9 % IV SOLN
INTRAVENOUS | Status: AC
  Filled 2021-05-30: qty 250

## 2021-05-30 MED ORDER — CHLORHEXIDINE GLUCONATE 0.12 % MT SOLN
15.0000 mL | Freq: Once | OROMUCOSAL | Status: AC
  Administered 2021-05-30: 15 mL via OROMUCOSAL
  Filled 2021-05-30: qty 15

## 2021-05-30 MED ORDER — OXYCODONE HCL 5 MG PO TABS: 5.0000 mg | ORAL_TABLET | Freq: Once | ORAL | Status: DC | PRN

## 2021-05-30 MED ORDER — OXYCODONE HCL 5 MG/5ML PO SOLN: 5.0000 mg | Freq: Once | ORAL | Status: DC | PRN

## 2021-05-30 MED ORDER — ONDANSETRON HCL 4 MG/2ML IJ SOLN
INTRAMUSCULAR | Status: DC | PRN
  Administered 2021-05-30: 4 mg via INTRAVENOUS

## 2021-05-30 MED ORDER — SODIUM CHLORIDE 0.9 % IV SOLN: INTRAVENOUS | Status: DC

## 2021-05-30 MED ORDER — AMLODIPINE BESYLATE 5 MG PO TABS
7.5000 mg | ORAL_TABLET | Freq: Every day | ORAL | Status: DC
  Administered 2021-05-31: 7.5 mg via ORAL
  Filled 2021-05-30: qty 1

## 2021-05-30 MED ORDER — ONDANSETRON HCL 4 MG/2ML IJ SOLN: 4.0000 mg | Freq: Four times a day (QID) | INTRAMUSCULAR | Status: DC | PRN

## 2021-05-30 MED ORDER — AMISULPRIDE (ANTIEMETIC) 5 MG/2ML IV SOLN: 10.0000 mg | Freq: Once | INTRAVENOUS | Status: DC | PRN

## 2021-05-30 MED ORDER — ORAL CARE MOUTH RINSE: 15.0000 mL | Freq: Once | OROMUCOSAL | Status: AC

## 2021-05-30 MED ORDER — LEVOTHYROXINE SODIUM 100 MCG PO TABS
100.0000 ug | ORAL_TABLET | Freq: Every day | ORAL | Status: DC
  Administered 2021-05-31: 100 ug via ORAL
  Filled 2021-05-30: qty 1

## 2021-05-30 MED ORDER — MIDAZOLAM HCL 2 MG/2ML IJ SOLN
INTRAMUSCULAR | Status: DC | PRN
  Administered 2021-05-30: 2 mg via INTRAVENOUS

## 2021-05-30 MED ORDER — LIDOCAINE 2% (20 MG/ML) 5 ML SYRINGE
INTRAMUSCULAR | Status: DC | PRN
  Administered 2021-05-30: 100 mg via INTRAVENOUS

## 2021-05-30 MED ORDER — ONDANSETRON HCL 4 MG/2ML IJ SOLN: 4.0000 mg | Freq: Once | INTRAMUSCULAR | Status: DC | PRN

## 2021-05-30 MED ORDER — LACTATED RINGERS IV SOLN: INTRAVENOUS | Status: DC

## 2021-05-30 MED ORDER — DOCUSATE SODIUM 100 MG PO CAPS
100.0000 mg | ORAL_CAPSULE | Freq: Two times a day (BID) | ORAL | Status: DC
  Filled 2021-05-30 (×2): qty 1

## 2021-05-30 MED ORDER — METRONIDAZOLE 500 MG/100ML IV SOLN
500.0000 mg | Freq: Once | INTRAVENOUS | Status: DC
  Filled 2021-05-30: qty 100

## 2021-05-30 MED ORDER — ROCURONIUM BROMIDE 10 MG/ML (PF) SYRINGE
PREFILLED_SYRINGE | INTRAVENOUS | Status: DC | PRN
  Administered 2021-05-30: 80 mg via INTRAVENOUS
  Administered 2021-05-30: 20 mg via INTRAVENOUS
  Administered 2021-05-30: 30 mg via INTRAVENOUS
  Administered 2021-05-30 (×2): 20 mg via INTRAVENOUS

## 2021-05-30 MED ORDER — FENTANYL CITRATE (PF) 100 MCG/2ML IJ SOLN
INTRAMUSCULAR | Status: DC | PRN
  Administered 2021-05-30: 100 ug via INTRAVENOUS
  Administered 2021-05-30 (×3): 50 ug via INTRAVENOUS

## 2021-05-30 MED ORDER — MONTELUKAST SODIUM 10 MG PO TABS
10.0000 mg | ORAL_TABLET | Freq: Every day | ORAL | Status: DC
  Administered 2021-05-31: 10 mg via ORAL
  Filled 2021-05-30: qty 1

## 2021-05-30 MED ORDER — HYDROMORPHONE HCL 1 MG/ML IJ SOLN: 0.2500 mg | INTRAMUSCULAR | Status: DC | PRN

## 2021-05-30 MED ORDER — FENTANYL CITRATE (PF) 250 MCG/5ML IJ SOLN
INTRAMUSCULAR | Status: AC
  Filled 2021-05-30: qty 5

## 2021-05-30 MED ORDER — MIDAZOLAM HCL 2 MG/2ML IJ SOLN
INTRAMUSCULAR | Status: AC
  Filled 2021-05-30: qty 2

## 2021-05-30 MED ORDER — PROPOFOL 10 MG/ML IV BOLUS
INTRAVENOUS | Status: DC | PRN
  Administered 2021-05-30: 200 mg via INTRAVENOUS

## 2021-05-30 NOTE — Anesthesia Procedure Notes (Signed)
Procedure Name: Intubation Date/Time: 05/30/2021 11:46 AM Performed by: Barnet Glasgow, MD Pre-anesthesia Checklist: Patient identified, Emergency Drugs available, Suction available and Patient being monitored Patient Re-evaluated:Patient Re-evaluated prior to induction Oxygen Delivery Method: Circle system utilized Preoxygenation: Pre-oxygenation with 100% oxygen Induction Type: IV induction Ventilation: Mask ventilation without difficulty and Oral airway inserted - appropriate to patient size Laryngoscope Size: Mac and 4 Grade View: Grade III Tube type: Oral Tube size: 7.5 mm Number of attempts: 3 Airway Equipment and Method: Stylet Placement Confirmation: ETT inserted through vocal cords under direct vision, positive ETCO2 and breath sounds checked- equal and bilateral Secured at: 23 cm Tube secured with: Tape Dental Injury: Teeth and Oropharynx as per pre-operative assessment  Comments: DL x1 by CRNA, grade 3 view. DL by Dr. Valma Cava, grade 3 view, bougie used. Successful ETT placement.

## 2021-05-30 NOTE — Transfer of Care (Signed)
Immediate Anesthesia Transfer of Care Note  Patient: Michael [name]  Procedure(s) Performed: CT WITH ANESTHESIA CRYOABLATION  Patient Location: PACU  Anesthesia Type:General  Level of Consciousness: awake, alert  and oriented  Airway & Oxygen Therapy: Patient Spontanous Breathing and Patient connected to face mask oxygen  Post-op Assessment: Report given to RN, Post -op Vital signs reviewed and stable and Patient moving all extremities X 4  Post vital signs: Reviewed and stable  Last Vitals:  Vitals Value Taken Time  BP 137/95   Temp    Pulse 66 05/30/21 1521  Resp 18 05/30/21 1521  SpO2 100 % 05/30/21 1521  Vitals shown include unvalidated device data.  Last Pain: There were no vitals filed for this visit.       Complications: No notable events documented.

## 2021-05-30 NOTE — Sedation Documentation (Signed)
Anesthesia in to sedate and monitor. 

## 2021-05-30 NOTE — Anesthesia Postprocedure Evaluation (Signed)
Anesthesia Post Note  Patient: Azreal Ridgell  Procedure(s) Performed: CT WITH ANESTHESIA CRYOABLATION     Patient location during evaluation: PACU Anesthesia Type: General Level of consciousness: awake and alert Pain management: pain level controlled Vital Signs Assessment: post-procedure vital signs reviewed and stable Respiratory status: spontaneous breathing, nonlabored ventilation, respiratory function stable and patient connected to nasal cannula oxygen Cardiovascular status: blood pressure returned to baseline and stable Postop Assessment: no apparent nausea or vomiting Anesthetic complications: no   No notable events documented.  Last Vitals:  Vitals:   05/30/21 1715 05/30/21 1730  BP: (!) 143/89 (!) 139/94  Pulse: 66 66  Resp: 12 11  Temp: 36.7 C   SpO2: 96% 95%    Last Pain:  Vitals:   05/30/21 1730  PainSc: 0-No pain                 Tiajuana Amass

## 2021-05-30 NOTE — H&P (Signed)
Referring Physician(s): [id number]  Supervising Physician: Aletta Edouard  Patient Status:  [facility] OP TBA  Chief Complaint: Left renal masses, prior history of right renal cell carcinoma   Subjective: Patient familiar to IR service from consultations on 04/04/2021 and 05/08/2021 to discuss treatment options for new left renal masses.  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  He developed metastatic disease to the left hilum in 2021 and began immunotherapy which resulted in resolution of left hilar disease.  He has had some small pulmonary nodules that have been followed by serial imaging and have been stable. MRI performed on 04/16/21 revealed two separate enhancing left renal masses with a more peripheral rounded interpolar lesion measuring 2.8 cm and a deeper enhancing lesion near the renal sinus measuring 2.1 x 1.6 x 2.0 cm. A subtle rounded area of the posterior left kidney was considered indeterminate.  Following discussions with Dr. Kathlene Cote patient presents today for image guided cryoablation and biopsy of the larger left renal mass as well as possible biopsy of the smaller left renal mass.  He currently denies fever, headache, chest pain, dyspnea, cough, abdominal/back pain, nausea, vomiting or bleeding.    Past Medical History:  Diagnosis Date   Cancer [facility])    Renal cell carcinoma   Hypertension    Hypothyroidism    Sleep apnea    wears cpap   Past Surgical History:  Procedure Laterality Date   IR RADIOLOGIST EVAL & MGMT  04/04/2021   no procedures performed   IR RADIOLOGIST EVAL & MGMT  05/08/2021   no procedures performed   LUNG BIOPSY  2021   right nephrectomy Right 08/2019   TONSILLECTOMY       Allergies: Penicillins  Medications: Prior to Admission medications   Medication Sig Start Date End  Date Taking? Authorizing Provider  amLODipine (NORVASC) 5 MG tablet Take 7.5 mg by mouth in the morning. 04/23/21  Yes [provider]  cholecalciferol (VITAMIN D3) 25 MCG (1000 UNIT) tablet Take 1,000 Units by mouth in the morning.   Yes [provider]  levothyroxine (SYNTHROID) 100 MCG tablet TAKE 1 TABLET(100 MCG) BY MOUTH DAILY BEFORE BREAKFAST Patient taking differently: Take 100 mcg by mouth in the morning. 02/01/21  Yes Shadad, Mathis Dad, MD  montelukast (SINGULAIR) 10 MG tablet Take 10 mg by mouth in the morning.   Yes [provider]  naphazoline-pheniramine (ALLERGY EYE) 0.025-0.3 % ophthalmic solution Place 1 drop into both eyes 2 (two) times daily as needed for eye irritation or allergies.   Yes [provider]  predniSONE (STERAPRED UNI-PAK 21 TAB) 5 MG (21) TBPK tablet Take 5-30 mg by mouth as directed. Take these number of tablets 6-5-4-3-2-1 on consecutive days until finished 05/16/21  Yes [provider]  tadalafil (CIALIS) 20 MG tablet Take 10-20 mg by mouth daily as needed for erectile dysfunction.   Yes [provider]     Vital Signs: Blood pressure 121/84, temperature 98.6, heart rate 69, respirations 16, O2 sat 98% room air    Physical Exam awake, alert.  Chest clear to auscultation bilaterally.  Heart with regular rate and rhythm.  Abdomen soft, positive bowel sounds, nontender.  Trace pretibial edema bilaterally.  Imaging: No results found.  Labs:  CBC: Recent Labs    02/08/21 1028 03/27/21 1100 05/11/21 1105 05/30/21 1037  WBC 6.5 6.0 5.8 6.9  HGB 15.0 15.3 15.4 15.2  HCT 44.2 45.7 44.9 45.0  PLT 263 250 254 239    COAGS: No results for input(s): INR, APTT in the last 8760 hours.  BMP: Recent Labs    06/12/20 0835 07/13/20 0115 07/24/20 0855 08/01/20 1317 08/16/20 1010 02/08/21 1028 03/27/21 1100 05/11/21 1105 05/30/21 1037  NA 141 133* 139 141   < > 143 141 140 141  K 4.3 3.6 4.9 5.1   < >  4.6 4.5 4.2 3.9  CL 106 101 105 102   < > 106 104 104 104  CO2 '28 23 26 30   '$ < > '25 28 26 26  '$ GLUCOSE 94 119* 97 85   < > 96 100* 96 103*  BUN '14 17 15 15   '$ < > '16 13 17 19  '$ CALCIUM 9.4 8.9 8.9 9.6   < > 8.9 9.3 9.0 9.2  CREATININE 1.49* 1.61* 1.45* 1.51*   < > 1.33* 1.22 1.26* 1.20  GFRNONAA 53* 48* 55* 52*   < > >60 >60 >60 >60  GFRAA >60 56* >60 >60  --   --   --   --   --    < > = values in this interval not displayed.    LIVER FUNCTION TESTS: Recent Labs    12/26/20 1100 02/08/21 1028 03/27/21 1100 05/11/21 1105  BILITOT 0.8 0.7 0.7 0.7  AST '30 27 24 23  '$ ALT '30 26 23 24  '$ ALKPHOS 85 99 105 112  PROT 7.7 7.7 7.7 7.6  ALBUMIN 4.3 4.2 4.2 4.0    Assessment and Plan: Patient familiar to IR service from consultations on 04/04/2021 and 05/08/2021 to discuss treatment options for new left renal masses.  He is a 55 year old male with history of hypertension, hypothyroidism, obstructive sleep apnea on CPAP and 9 cm right clear-cell renal carcinoma treated by right nephrectomy in October, 2020 in [place], [place].  He developed metastatic disease to the left hilum in 2021 and began immunotherapy which resulted in resolution of left hilar disease.  He has had some small pulmonary nodules that have been followed by serial imaging and have been stable. MRI performed on 04/16/21 revealed two separate enhancing left renal masses with a more peripheral rounded interpolar lesion measuring 2.8 cm and a deeper enhancing lesion near the renal sinus measuring 2.1 x 1.6 x 2.0 cm. A subtle rounded area of the posterior left kidney was considered indeterminate.  Following discussions with Dr. Kathlene Cote patient presents today for image guided cryoablation and biopsy of the larger left renal mass as well as possible biopsy of the smaller left renal mass.  Details/risks of procedure, including but not limited to, internal bleeding, infection, injury to adjacent structures, anesthesia related complications  discussed with patient with his understanding and consent.  Post procedure he will be admitted to the hospital for overnight observation.   Electronically Signed: D. Rowe Latavius, PA-C 05/30/2021, 11:21 AM   I spent a total of 30 minutes at the the patient's bedside AND on the patient's hospital floor or unit, greater than 50% of which was counseling/coordinating care for image guided cryoablation and biopsy of larger left renal mass and possible biopsy of smaller left renal mass

## 2021-05-30 NOTE — Procedures (Signed)
Interventional Radiology Procedure Note  Procedure: CT and US guided biopsy of left renal masses x 2, cryoablation of left renal mass  Anesthesia: General  Complications: None  Estimated Blood Loss: 10 mL  Findings: Biopsy of 3.2 cm lateral, exophytic interpolar left renal mass as well as deeper 2.2 cm renal sinus mass.  Larger 3.2 cm renal mass treated with cryoablation with placement of 3 separate Ice Rod Plus probes and 2 cycles of cryoablation.  Plan: PACU recovery followed by overnight observation.  Venetia Night. Kathlene Cote, M.D Pager:  [phone]

## 2021-05-30 NOTE — Progress Notes (Signed)
Patient ID: Michael [name], male   DOB: 1966/06/30, 55 y.o.   MRN: [id number] Patient status post CT and ultrasound-guided biopsy of left renal masses x2 and cryoablation of larger left renal mass earlier today ;currently in PACU.  Doing fairly well.  Only has minimal left flank discomfort at puncture sites.  Reports no nausea ,vomiting or respiratory issues.  BP 136/90, O2 sats 99% 2 L.  Heart rate 68.  Yellow urine in Foley catheter.  Plan for overnight observation.  Check a.m. labs.  Advance diet as tolerated.  Hopefully DC Foley later this evening as long as no hematuria noted; follow-up with Dr. Kathlene Cote in [place] clinic in 3 to 4 weeks.  Check final pathology from renal biopsies.

## 2021-05-31 ENCOUNTER — Encounter (HOSPITAL_COMMUNITY): Payer: Self-pay | Admitting: Interventional Radiology

## 2021-05-31 ENCOUNTER — Other Ambulatory Visit: Payer: Self-pay | Admitting: Radiology

## 2021-05-31 DIAGNOSIS — E039 Hypothyroidism, unspecified: Secondary | ICD-10-CM | POA: Diagnosis not present

## 2021-05-31 DIAGNOSIS — Z79899 Other long term (current) drug therapy: Secondary | ICD-10-CM | POA: Diagnosis not present

## 2021-05-31 DIAGNOSIS — Z880 Allergy status to penicillin: Secondary | ICD-10-CM | POA: Diagnosis not present

## 2021-05-31 DIAGNOSIS — G4733 Obstructive sleep apnea (adult) (pediatric): Secondary | ICD-10-CM | POA: Diagnosis not present

## 2021-05-31 DIAGNOSIS — I10 Essential (primary) hypertension: Secondary | ICD-10-CM | POA: Diagnosis not present

## 2021-05-31 DIAGNOSIS — Z79890 Hormone replacement therapy (postmenopausal): Secondary | ICD-10-CM | POA: Diagnosis not present

## 2021-05-31 DIAGNOSIS — N2889 Other specified disorders of kidney and ureter: Secondary | ICD-10-CM

## 2021-05-31 DIAGNOSIS — C642 Malignant neoplasm of left kidney, except renal pelvis: Secondary | ICD-10-CM | POA: Diagnosis not present

## 2021-05-31 LAB — CBC
HCT: 44.5 % (ref 39.0–52.0)
Hemoglobin: 14.9 g/dL (ref 13.0–17.0)
MCH: 30 pg (ref 26.0–34.0)
MCHC: 33.5 g/dL (ref 30.0–36.0)
MCV: 89.5 fL (ref 80.0–100.0)
Platelets: 236 10*3/uL (ref 150–400)
RBC: 4.97 MIL/uL (ref 4.22–5.81)
RDW: 13 % (ref 11.5–15.5)
WBC: 13.7 10*3/uL — ABNORMAL HIGH (ref 4.0–10.5)
nRBC: 0 % (ref 0.0–0.2)

## 2021-05-31 LAB — BASIC METABOLIC PANEL
Anion gap: 6 (ref 5–15)
BUN: 18 mg/dL (ref 6–20)
CO2: 26 mmol/L (ref 22–32)
Calcium: 9 mg/dL (ref 8.9–10.3)
Chloride: 106 mmol/L (ref 98–111)
Creatinine, Ser: 1.28 mg/dL — ABNORMAL HIGH (ref 0.61–1.24)
GFR, Estimated: >60 mL/min (ref >60)
Glucose, Bld: 146 mg/dL — ABNORMAL HIGH (ref 70–99)
Potassium: 3.9 mmol/L (ref 3.5–5.1)
Sodium: 138 mmol/L (ref 135–145)

## 2021-05-31 LAB — SURGICAL PATHOLOGY

## 2021-05-31 NOTE — Discharge Summary (Signed)
Physician Discharge Summary      Patient ID: Michael [name] MRN: [id number] DOB/AGE: [birth year] 55 y.o.  Admit date: 05/30/2021 Discharge date: 05/31/2021  Admission Diagnoses: Active Problems:   Left renal mass  Discharge Diagnoses:  Active Problems:   Left renal mass    Procedures: Procedure(s): CT WITH ANESTHESIA CRYOABLATION OF LEFT RENAL MASS  Discharged Condition: good  Hospital Course: Admitted to the floor on 7/27 after successful cyroablation of left renal mass with biopsy. Tolerated procedure well under general anesthesia with no intra-operative complications. No overnight issues. Foley was removed, pt has voided on own, no hematuria. Denies much pain at all. Has tolerated regular diet and been OOB. Labs reviewed, stable Stable for discharge. All medications, instructions and follow up plans discussed.  Consults: None   Discharge Exam: Blood pressure 124/74, pulse 64, temperature 97.7 F (36.5 C), resp. rate 17, height '5\' 6"'$  (1.676 m), weight 116.3 kg, SpO2 96 %. General: NAD Lungs: CTA without w/r/r Heart: Regular Abdomen: Left flank puncture sites clean, dry, no hematoma, NT   Disposition: Discharge disposition: 01-Home or Self Care       Discharge Instructions     Call MD for:  persistant nausea and vomiting   Complete by: As directed    Call MD for:  redness, tenderness, or signs of infection (pain, swelling, redness, odor or green/yellow discharge around incision site)   Complete by: As directed    Call MD for:  severe uncontrolled pain   Complete by: As directed    Call MD for:  temperature >100.4   Complete by: As directed    Diet - low sodium heart healthy   Complete by: As directed    Increase activity slowly   Complete by: As directed    May shower / Bathe   Complete by: As directed    Remove dressing in 24 hours   Complete by: As directed       Allergies as of 05/31/2021       Reactions   Penicillins Other (See Comments)    Childhood allergies- unk        Medication List     TAKE these medications    Allergy Eye 0.025-0.3 % ophthalmic solution Generic drug: naphazoline-pheniramine Place 1 drop into both eyes 2 (two) times daily as needed for eye irritation or allergies.   amLODipine 5 MG tablet Commonly known as: NORVASC Take 7.5 mg by mouth in the morning.   cholecalciferol 25 MCG (1000 UNIT) tablet Commonly known as: VITAMIN D3 Take 1,000 Units by mouth in the morning.   levothyroxine 100 MCG tablet Commonly known as: SYNTHROID TAKE 1 TABLET(100 MCG) BY MOUTH DAILY BEFORE BREAKFAST What changed: See the new instructions.   montelukast 10 MG tablet Commonly known as: SINGULAIR Take 10 mg by mouth in the morning.   predniSONE 5 MG (21) Tbpk tablet Commonly known as: STERAPRED UNI-PAK 21 TAB Take 5-30 mg by mouth as directed. Take these number of tablets 6-5-4-3-2-1 on consecutive days until finished   tadalafil 20 MG tablet Commonly known as: CIALIS Take 10-20 mg by mouth daily as needed for erectile dysfunction.        Follow-up Information     Aletta Edouard, MD. Go in 3 week(s).   Specialties: Interventional Radiology, Radiology Why: Clinic will call you to schedule follow up Contact information: [address] [place] [address] [phone]  Signed: Ascencion Dike PA-C 05/31/2021, 9:23 AM

## 2021-05-31 NOTE — Plan of Care (Signed)

## 2021-06-18 DIAGNOSIS — I129 Hypertensive chronic kidney disease with stage 1 through stage 4 chronic kidney disease, or unspecified chronic kidney disease: Secondary | ICD-10-CM | POA: Diagnosis not present

## 2021-06-18 DIAGNOSIS — N1831 Chronic kidney disease, stage 3a: Secondary | ICD-10-CM | POA: Diagnosis not present

## 2021-06-18 DIAGNOSIS — C641 Malignant neoplasm of right kidney, except renal pelvis: Secondary | ICD-10-CM | POA: Diagnosis not present

## 2021-06-18 DIAGNOSIS — E039 Hypothyroidism, unspecified: Secondary | ICD-10-CM | POA: Diagnosis not present

## 2021-06-29 ENCOUNTER — Other Ambulatory Visit: Payer: BC Managed Care – PPO

## 2021-07-02 DIAGNOSIS — M65322 Trigger finger, left index finger: Secondary | ICD-10-CM | POA: Diagnosis not present

## 2021-07-03 ENCOUNTER — Inpatient Hospital Stay: Payer: BC Managed Care – PPO | Attending: Oncology

## 2021-07-03 ENCOUNTER — Encounter (HOSPITAL_COMMUNITY): Payer: Self-pay

## 2021-07-03 ENCOUNTER — Ambulatory Visit (HOSPITAL_COMMUNITY)
Admission: RE | Admit: 2021-07-03 | Discharge: 2021-07-03 | Disposition: A | Discharge status: Home / Self Care | Payer: BC Managed Care – PPO | Source: Ambulatory Visit | Attending: Oncology | Admitting: Oncology

## 2021-07-03 ENCOUNTER — Other Ambulatory Visit: Payer: Self-pay

## 2021-07-03 DIAGNOSIS — C641 Malignant neoplasm of right kidney, except renal pelvis: Secondary | ICD-10-CM | POA: Insufficient documentation

## 2021-07-03 DIAGNOSIS — C649 Malignant neoplasm of unspecified kidney, except renal pelvis: Secondary | ICD-10-CM

## 2021-07-03 DIAGNOSIS — R918 Other nonspecific abnormal finding of lung field: Secondary | ICD-10-CM | POA: Diagnosis not present

## 2021-07-03 DIAGNOSIS — C642 Malignant neoplasm of left kidney, except renal pelvis: Secondary | ICD-10-CM | POA: Diagnosis not present

## 2021-07-03 DIAGNOSIS — N2889 Other specified disorders of kidney and ureter: Secondary | ICD-10-CM | POA: Diagnosis not present

## 2021-07-03 DIAGNOSIS — E039 Hypothyroidism, unspecified: Secondary | ICD-10-CM | POA: Diagnosis not present

## 2021-07-03 DIAGNOSIS — R911 Solitary pulmonary nodule: Secondary | ICD-10-CM | POA: Diagnosis not present

## 2021-07-03 DIAGNOSIS — Z905 Acquired absence of kidney: Secondary | ICD-10-CM | POA: Diagnosis not present

## 2021-07-03 LAB — CBC WITH DIFFERENTIAL (CANCER CENTER ONLY)
Abs Immature Granulocytes: 0.02 10*3/uL (ref 0.00–0.07)
Basophils Absolute: 0 10*3/uL (ref 0.0–0.1)
Basophils Relative: 1 %
Eosinophils Absolute: 0.2 10*3/uL (ref 0.0–0.5)
Eosinophils Relative: 4 %
HCT: 43.4 % (ref 39.0–52.0)
Hemoglobin: 14.5 g/dL (ref 13.0–17.0)
Immature Granulocytes: 0 %
Lymphocytes Relative: 31 %
Lymphs Abs: 1.9 10*3/uL (ref 0.7–4.0)
MCH: 29.5 pg (ref 26.0–34.0)
MCHC: 33.4 g/dL (ref 30.0–36.0)
MCV: 88.4 fL (ref 80.0–100.0)
Monocytes Absolute: 0.5 10*3/uL (ref 0.1–1.0)
Monocytes Relative: 9 %
Neutro Abs: 3.4 10*3/uL (ref 1.7–7.7)
Neutrophils Relative %: 55 %
Platelet Count: 231 10*3/uL (ref 150–400)
RBC: 4.91 MIL/uL (ref 4.22–5.81)
RDW: 13.1 % (ref 11.5–15.5)
WBC Count: 6.1 10*3/uL (ref 4.0–10.5)
nRBC: 0 % (ref 0.0–0.2)

## 2021-07-03 LAB — CMP (CANCER CENTER ONLY)
ALT: 25 U/L (ref 0–44)
AST: 23 U/L (ref 15–41)
Albumin: 4 g/dL (ref 3.5–5.0)
Alkaline Phosphatase: 103 U/L (ref 38–126)
Anion gap: 10 (ref 5–15)
BUN: 16 mg/dL (ref 6–20)
CO2: 26 mmol/L (ref 22–32)
Calcium: 9.2 mg/dL (ref 8.9–10.3)
Chloride: 105 mmol/L (ref 98–111)
Creatinine: 1.34 mg/dL — ABNORMAL HIGH (ref 0.61–1.24)
GFR, Estimated: >60 mL/min (ref >60)
Glucose, Bld: 108 mg/dL — ABNORMAL HIGH (ref 70–99)
Potassium: 4.8 mmol/L (ref 3.5–5.1)
Sodium: 141 mmol/L (ref 135–145)
Total Bilirubin: 0.8 mg/dL (ref 0.3–1.2)
Total Protein: 7.4 g/dL (ref 6.5–8.1)

## 2021-07-03 LAB — TSH: TSH: 4.95 u[IU]/mL — ABNORMAL HIGH (ref 0.320–4.118)

## 2021-07-06 ENCOUNTER — Other Ambulatory Visit: Payer: Self-pay

## 2021-07-06 ENCOUNTER — Inpatient Hospital Stay: Payer: BC Managed Care – PPO | Attending: Oncology | Admitting: Oncology

## 2021-07-06 DIAGNOSIS — E039 Hypothyroidism, unspecified: Secondary | ICD-10-CM | POA: Diagnosis not present

## 2021-07-06 DIAGNOSIS — R7989 Other specified abnormal findings of blood chemistry: Secondary | ICD-10-CM | POA: Diagnosis not present

## 2021-07-06 DIAGNOSIS — C7800 Secondary malignant neoplasm of unspecified lung: Secondary | ICD-10-CM | POA: Diagnosis not present

## 2021-07-06 DIAGNOSIS — K754 Autoimmune hepatitis: Secondary | ICD-10-CM | POA: Diagnosis not present

## 2021-07-06 DIAGNOSIS — N2889 Other specified disorders of kidney and ureter: Secondary | ICD-10-CM | POA: Diagnosis not present

## 2021-07-06 DIAGNOSIS — C641 Malignant neoplasm of right kidney, except renal pelvis: Secondary | ICD-10-CM | POA: Insufficient documentation

## 2021-07-06 DIAGNOSIS — C649 Malignant neoplasm of unspecified kidney, except renal pelvis: Secondary | ICD-10-CM | POA: Diagnosis not present

## 2021-07-06 DIAGNOSIS — Z79899 Other long term (current) drug therapy: Secondary | ICD-10-CM | POA: Insufficient documentation

## 2021-07-06 NOTE — Progress Notes (Signed)
Hematology and Oncology Follow Up Visit  Name [name] [id number] [birth year] 55 y.o. 07/06/2021 12:31 PM [name], MDRusso, [name], MD   Principle Diagnosis: 55 year old man with kidney cancer diagnosed in October 2020.  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  Secondary diagnosis: Left kidney tumors noted in May 2022.  He was found to have 2.8 x 2.8 x 2.7 mm lesion measuring 2.1 x 2.0 x 1.6.   Prior Therapy: He underwent a right nephrectomy on August 27, 2019 performed while he was living in [place].  The final pathology showed clear cell renal cell carcinoma, nuclear grade 3 with 10% necrosis negative margins.  Final pathological stage was T2ANX.  He is status post Pembrolizumab and axitinib started in March 2021.  He received two cycles of therapy.  Therapy was interrupted because of increased LFTs and autoimmune hepatitis.    He has been receiving his therapy in [place] and relocated to this area.  Axitinib 5 mg daily started on July 05, 2020.  Therapy discontinued on July 13, 2020 because of elevated LFTs.  He is status post cryoablation of a left renal mass completed on May 30, 2021.  Current therapy: Active surveillance and under consideration to start systemic therapy.  Interim History: Michael [name] presents today for repeat evaluation.  Since the last visit, he underwent a CT-guided biopsy of his left renal masses as well as cryoablation of the dominant mass.  He tolerated the procedure well without any major complications.  He denies any nausea, vomiting or abdominal pain.  He denies any recent hospitalizations or illnesses.    Medications: Updated on review. Current Outpatient Medications  Medication Sig Dispense Refill   amLODipine (NORVASC) 5 MG tablet Take 7.5 mg by mouth in the morning.     cholecalciferol (VITAMIN D3) 25 MCG (1000 UNIT) tablet Take 1,000 Units by mouth in the morning.     levothyroxine (SYNTHROID) 100  MCG tablet TAKE 1 TABLET(100 MCG) BY MOUTH DAILY BEFORE BREAKFAST 90 tablet 0   montelukast (SINGULAIR) 10 MG tablet Take 10 mg by mouth in the morning.     naphazoline-pheniramine (ALLERGY EYE) 0.025-0.3 % ophthalmic solution Place 1 drop into both eyes 2 (two) times daily as needed for eye irritation or allergies.     predniSONE (STERAPRED UNI-PAK 21 TAB) 5 MG (21) TBPK tablet Take 5-30 mg by mouth as directed. Take these number of tablets 6-5-4-3-2-1 on consecutive days until finished     tadalafil (CIALIS) 20 MG tablet Take 10-20 mg by mouth daily as needed for erectile dysfunction.     No current facility-administered medications for this visit.     Allergies:  Allergies  Allergen Reactions   Penicillins Other (See Comments)    Childhood allergies- unk      Physical Exam:    Blood pressure 122/84, pulse 63, temperature 98.9 F (37.2 C), temperature source Oral, resp. rate 18, height '5\' 6"'$  (1.676 m), weight 258 lb 12.8 oz (117.4 kg), SpO2 98 %.       ECOG: 0    General appearance: Comfortable appearing without any discomfort Head: Normocephalic without any trauma Oropharynx: Mucous membranes are moist and pink without any thrush or ulcers. Eyes: Pupils are equal and round reactive to light. Lymph nodes: No cervical, supraclavicular, inguinal or axillary lymphadenopathy.   Heart:regular rate and rhythm.  S1 and S2 without leg edema. Lung: Clear without any rhonchi or wheezes.  No dullness to percussion. Abdomin: Soft, nontender, nondistended with good bowel sounds.  No hepatosplenomegaly. Musculoskeletal: No joint deformity or effusion.  Full range of motion noted. Neurological: No deficits noted on motor, sensory and deep tendon reflex exam. Skin: No petechial rash or dryness.  Appeared moist.                 Lab Results: Lab Results  Component Value Date   WBC 6.1 07/03/2021   HGB 14.5 07/03/2021   HCT 43.4 07/03/2021   MCV 88.4 07/03/2021    PLT 231 07/03/2021     Chemistry      Component Value Date/Time   NA 141 07/03/2021 0833   K 4.8 07/03/2021 0833   CL 105 07/03/2021 0833   CO2 26 07/03/2021 0833   BUN 16 07/03/2021 0833   CREATININE 1.34 (H) 07/03/2021 0833      Component Value Date/Time   CALCIUM 9.2 07/03/2021 0833   ALKPHOS 103 07/03/2021 0833   AST 23 07/03/2021 0833   ALT 25 07/03/2021 0833   BILITOT 0.8 07/03/2021 0833        IMPRESSION: Chest Impression:   1. Several small bilateral pulmonary nodules are slightly more prominent. Concern for mild progression of pulmonary metastasis. Recommend close attention on follow-up. 2. No lymphadenopathy   Abdomen / Pelvis Impression:   1. No change in size bilobed LEFT renal mass. Post cryotherapy change noted in the perinephric fat. 2. No evidence of local recurrence in the RIGHT nephrectomy bed. 3. No visceral metastasis in the abdomen pelvis on noncontrast exam. 4. No skeletal metastasis      Impression and Plan:  55 year old with:   1.  Kidney cancer diagnosed in 2020.  He developed stage IV clear-cell renal cell with pulmonary involvement.  CT scan obtained on July 03, 2021 was personally reviewed and showed overall stable disease although small progression in his bilateral pulmonary nodules was noted although they remain few millimeters in measurements.   Treatment options moving forward were discussed.  Continued active surveillance versus restarting systemic therapy were reviewed.  Systemic therapy will be in the form of Pembrolizumab single agent, ipilimumab and nivolumab combination or oral targeted therapy different than axitinib.  The rationale risks and benefits of all these options were reviewed.  He is agreeable to proceed at this time and will proceed with 200 mg every 3 weeks.     2.  Hypothyroidism: TSH is close to normal range at this time.  No additional modification is needed.   3.    Left kidney masses: He is status post  cryoablation of his dominant lesion without any complications.  We will continue to have active surveillance regarding the other 1 which could be ablated in the future and increased in size.   4.  Goals of care and prognosis: His disease is incurable although aggressive measures are warranted given his reasonable performance status.   5.  Elevated AST and ALT: Resolved at this time.   6.  Follow-up: He will return in the near future to start [facility] and MD follow-up for cycle 2 of therapy.   30  minutes were spent on this encounter.  Time was dedicated to reviewing laboratory data, disease status update, reviewing imaging studies and treatment choices for the future.    Michael Button, MD 9/2/202212:31 PM

## 2021-07-06 NOTE — Progress Notes (Signed)
START OFF PATHWAY REGIMEN - Renal Cell   [id number]:Pembrolizumab 200 mg IV D1 q21 Days:   A cycle is every 21 days:     Pembrolizumab   **Always confirm dose/schedule in your pharmacy ordering system**  Patient Characteristics: Stage IV (Unresected T4M0 or Any T, M1)/Metastatic Disease, Clear Cell, Second Line, No Prior Checkpoint Inhibitor Therapeutic Status: Stage IV (Unresected T4M0 or Any T, M1)/Metastatic Disease Histology: Clear Cell Line of Therapy: Second Line Intent of Therapy: Non-Curative / Palliative Intent, Discussed with Patient

## 2021-07-10 ENCOUNTER — Telehealth: Payer: Self-pay | Admitting: Oncology

## 2021-07-10 NOTE — Telephone Encounter (Signed)
Left message with follow-up appointments per 9/2 los. 

## 2021-07-20 NOTE — Progress Notes (Signed)
Pharmacist Chemotherapy Monitoring - Initial Assessment    Anticipated start date: 07/27/21   The following has been reviewed per standard work regarding the patient's treatment regimen: The patient's diagnosis, treatment plan and drug doses, and organ/hematologic function Lab orders and baseline tests specific to treatment regimen  The treatment plan start date, drug sequencing, and pre-medications Prior authorization status  Patient's documented medication list, including drug-drug interaction screen and prescriptions for anti-emetics and supportive care specific to the treatment regimen The drug concentrations, fluid compatibility, administration routes, and timing of the medications to be used The patient's access for treatment and lifetime cumulative dose history, if applicable  The patient's medication allergies and previous infusion related reactions, if applicable   Changes made to treatment plan:  N/A  Follow up needed:  Pending authorization for treatment    Adelina Mings, [place], 07/20/2021  2:48 PM

## 2021-07-27 ENCOUNTER — Inpatient Hospital Stay: Payer: BC Managed Care – PPO

## 2021-07-27 ENCOUNTER — Other Ambulatory Visit: Payer: Self-pay

## 2021-07-27 VITALS — BP 119/76 | HR 62 | Temp 98.4°F | Resp 17 | Wt 256.0 lb

## 2021-07-27 DIAGNOSIS — E039 Hypothyroidism, unspecified: Secondary | ICD-10-CM | POA: Diagnosis not present

## 2021-07-27 DIAGNOSIS — Z79899 Other long term (current) drug therapy: Secondary | ICD-10-CM | POA: Diagnosis not present

## 2021-07-27 DIAGNOSIS — C649 Malignant neoplasm of unspecified kidney, except renal pelvis: Secondary | ICD-10-CM

## 2021-07-27 DIAGNOSIS — K754 Autoimmune hepatitis: Secondary | ICD-10-CM | POA: Diagnosis not present

## 2021-07-27 DIAGNOSIS — C641 Malignant neoplasm of right kidney, except renal pelvis: Secondary | ICD-10-CM | POA: Diagnosis not present

## 2021-07-27 DIAGNOSIS — C7800 Secondary malignant neoplasm of unspecified lung: Secondary | ICD-10-CM | POA: Diagnosis not present

## 2021-07-27 DIAGNOSIS — N2889 Other specified disorders of kidney and ureter: Secondary | ICD-10-CM | POA: Diagnosis not present

## 2021-07-27 DIAGNOSIS — R7989 Other specified abnormal findings of blood chemistry: Secondary | ICD-10-CM | POA: Diagnosis not present

## 2021-07-27 LAB — CBC WITH DIFFERENTIAL (CANCER CENTER ONLY)
Abs Immature Granulocytes: 0.02 10*3/uL (ref 0.00–0.07)
Basophils Absolute: 0 10*3/uL (ref 0.0–0.1)
Basophils Relative: 0 %
Eosinophils Absolute: 0.2 10*3/uL (ref 0.0–0.5)
Eosinophils Relative: 2 %
HCT: 44.8 % (ref 39.0–52.0)
Hemoglobin: 15.5 g/dL (ref 13.0–17.0)
Immature Granulocytes: 0 %
Lymphocytes Relative: 28 %
Lymphs Abs: 2.4 10*3/uL (ref 0.7–4.0)
MCH: 29.8 pg (ref 26.0–34.0)
MCHC: 34.6 g/dL (ref 30.0–36.0)
MCV: 86 fL (ref 80.0–100.0)
Monocytes Absolute: 0.6 10*3/uL (ref 0.1–1.0)
Monocytes Relative: 7 %
Neutro Abs: 5.1 10*3/uL (ref 1.7–7.7)
Neutrophils Relative %: 63 %
Platelet Count: 260 10*3/uL (ref 150–400)
RBC: 5.21 MIL/uL (ref 4.22–5.81)
RDW: 12.9 % (ref 11.5–15.5)
WBC Count: 8.3 10*3/uL (ref 4.0–10.5)
nRBC: 0 % (ref 0.0–0.2)

## 2021-07-27 LAB — CMP (CANCER CENTER ONLY)
ALT: 23 U/L (ref 0–44)
AST: 25 U/L (ref 15–41)
Albumin: 4.4 g/dL (ref 3.5–5.0)
Alkaline Phosphatase: 112 U/L (ref 38–126)
Anion gap: 13 (ref 5–15)
BUN: 17 mg/dL (ref 6–20)
CO2: 22 mmol/L (ref 22–32)
Calcium: 9.3 mg/dL (ref 8.9–10.3)
Chloride: 105 mmol/L (ref 98–111)
Creatinine: 1.33 mg/dL — ABNORMAL HIGH (ref 0.61–1.24)
GFR, Estimated: >60 mL/min (ref >60)
Glucose, Bld: 87 mg/dL (ref 70–99)
Potassium: 3.7 mmol/L (ref 3.5–5.1)
Sodium: 140 mmol/L (ref 135–145)
Total Bilirubin: 0.9 mg/dL (ref 0.3–1.2)
Total Protein: 7.9 g/dL (ref 6.5–8.1)

## 2021-07-27 MED ORDER — SODIUM CHLORIDE 0.9 % IV SOLN: Freq: Once | INTRAVENOUS | Status: AC

## 2021-07-27 MED ORDER — SODIUM CHLORIDE 0.9 % IV SOLN
200.0000 mg | Freq: Once | INTRAVENOUS | Status: AC
  Administered 2021-07-27: 200 mg via INTRAVENOUS
  Filled 2021-07-27: qty 8

## 2021-07-27 NOTE — Patient Instructions (Addendum)
[facility]  Discharge Instructions: Thank you for choosing [facility] to provide your oncology and hematology care.   If you have a lab appointment with the Cancer Center, please go directly to the Cancer Center and check in at the registration area.   Wear comfortable clothing and clothing appropriate for easy access to any Portacath or PICC line.   We strive to give you quality time with your provider. You may need to reschedule your appointment if you arrive late (15 or more minutes).  Arriving late affects you and other patients whose appointments are after yours.  Also, if you miss three or more appointments without notifying the office, you may be dismissed from the clinic at the provider's discretion.      For prescription refill requests, have your pharmacy contact our office and allow 72 hours for refills to be completed.    Today you received the following chemotherapy and/or immunotherapy agents Keytruda      To help prevent nausea and vomiting after your treatment, we encourage you to take your nausea medication as directed.  BELOW ARE SYMPTOMS THAT SHOULD BE REPORTED IMMEDIATELY: *FEVER GREATER THAN 100.4 F (38 C) OR HIGHER *CHILLS OR SWEATING *NAUSEA AND VOMITING THAT IS NOT CONTROLLED WITH YOUR NAUSEA MEDICATION *UNUSUAL SHORTNESS OF BREATH *UNUSUAL BRUISING OR BLEEDING *URINARY PROBLEMS (pain or burning when urinating, or frequent urination) *BOWEL PROBLEMS (unusual diarrhea, constipation, pain near the anus) TENDERNESS IN MOUTH AND THROAT WITH OR WITHOUT PRESENCE OF ULCERS (sore throat, sores in mouth, or a toothache) UNUSUAL RASH, SWELLING OR PAIN  UNUSUAL VAGINAL DISCHARGE OR ITCHING   Items with * indicate a potential emergency and should be followed up as soon as possible or go to the Emergency Department if any problems should occur.  Please show the CHEMOTHERAPY ALERT CARD or IMMUNOTHERAPY ALERT CARD at check-in to  the Emergency Department and triage nurse.  Should you have questions after your visit or need to cancel or reschedule your appointment, please contact [facility]  Dept: [phone]  and follow the prompts.  Office hours are 8:00 a.m. to 4:30 p.m. Monday - Friday. Please note that voicemails left after 4:00 p.m. may not be returned until the following business day.  We are closed weekends and major holidays. You have access to a nurse at all times for urgent questions. Please call the main number to the clinic Dept: [phone] and follow the prompts.   For any non-urgent questions, you may also contact your provider using MyChart. We now offer e-Visits for anyone 18 and older to request care online for non-urgent symptoms. For details visit [email or web address].   Also download the MyChart app! Go to the app store, search "MyChart", open the app, select [facility], and log in with your MyChart username and password.  Due to Covid, a mask is required upon entering the hospital/clinic. If you do not have a mask, one will be given to you upon arrival. For doctor visits, patients may have 1 support person aged 18 or older with them. For treatment visits, patients cannot have anyone with them due to current Covid guidelines and our immunocompromised population.   Pembrolizumab injection What is this medication? PEMBROLIZUMAB (pem broe liz ue mab) is a monoclonal antibody. It is used totreat certain types of cancer. This medicine may be used for other purposes; ask your health care provider orpharmacist if you have questions. COMMON BRAND NAME(S): Keytruda What should I   tell my care team before I take this medication? They need to know if you have any of these conditions: autoimmune diseases like Crohn's disease, ulcerative colitis, or lupus have had or planning to have an allogeneic stem cell transplant (uses someone else's stem cells) history of organ  transplant history of chest radiation nervous system problems like myasthenia gravis or Guillain-Barre syndrome an unusual or allergic reaction to pembrolizumab, other medicines, foods, dyes, or preservatives pregnant or trying to get pregnant breast-feeding How should I use this medication? This medicine is for infusion into a vein. It is given by a health careprofessional in a hospital or clinic setting. A special MedGuide will be given to you before each treatment. Be sure to readthis information carefully each time. Talk to your pediatrician regarding the use of this medicine in children. While this drug may be prescribed for children as young as 6 months for selectedconditions, precautions do apply. Overdosage: If you think you have taken too much of this medicine contact apoison control center or emergency room at once. NOTE: This medicine is only for you. Do not share this medicine with others. What if I miss a dose? It is important not to miss your dose. Call your doctor or health careprofessional if you are unable to keep an appointment. What may interact with this medication? Interactions have not been studied. This list may not describe all possible interactions. Give your health care provider a list of all the medicines, herbs, non-prescription drugs, or dietary supplements you use. Also tell them if you smoke, drink alcohol, or use illegaldrugs. Some items may interact with your medicine. What should I watch for while using this medication? Your condition will be monitored carefully while you are receiving thismedicine. You may need blood work done while you are taking this medicine. Do not become pregnant while taking this medicine or for 4 months after stopping it. Women should inform their doctor if they wish to become pregnant or think they might be pregnant. There is a potential for serious side effects to an unborn child. Talk to your health care professional or pharmacist for  more information. Do not breast-feed an infant while taking this medicine orfor 4 months after the last dose. What side effects may I notice from receiving this medication? Side effects that you should report to your doctor or health care professionalas soon as possible: allergic reactions like skin rash, itching or hives, swelling of the face, lips, or tongue bloody or black, tarry breathing problems changes in vision chest pain chills confusion constipation cough diarrhea dizziness or feeling faint or lightheaded fast or irregular heartbeat fever flushing joint pain low blood counts - this medicine may decrease the number of white blood cells, red blood cells and platelets. You may be at increased risk for infections and bleeding. muscle pain muscle weakness pain, tingling, numbness in the hands or feet persistent headache redness, blistering, peeling or loosening of the skin, including inside the mouth signs and symptoms of high blood sugar such as dizziness; dry mouth; dry skin; fruity breath; nausea; stomach pain; increased hunger or thirst; increased urination signs and symptoms of kidney injury like trouble passing urine or change in the amount of urine signs and symptoms of liver injury like dark urine, light-colored stools, loss of appetite, nausea, right upper belly pain, yellowing of the eyes or skin sweating swollen lymph nodes weight loss Side effects that usually do not require medical attention (report to yourdoctor or health care professional if they continue   or are bothersome): decreased appetite hair loss tiredness This list may not describe all possible side effects. Call your doctor for medical advice about side effects. You may report side effects to FDA at1-800-FDA-1088. Where should I keep my medication? This drug is given in a hospital or clinic and will not be stored at home. NOTE: This sheet is a summary. It may not cover all possible information. If you  have questions about this medicine, talk to your doctor, pharmacist, orhealth care provider.  2022 Elsevier/Gold Standard (2019-09-22 21:44:53)   

## 2021-07-30 LAB — TSH: TSH: 6.541 u[IU]/mL — ABNORMAL HIGH (ref 0.320–4.118)

## 2021-08-17 ENCOUNTER — Inpatient Hospital Stay: Payer: BC Managed Care – PPO | Attending: Oncology

## 2021-08-17 ENCOUNTER — Inpatient Hospital Stay: Payer: BC Managed Care – PPO

## 2021-08-17 ENCOUNTER — Other Ambulatory Visit: Payer: Self-pay

## 2021-08-17 ENCOUNTER — Inpatient Hospital Stay (HOSPITAL_BASED_OUTPATIENT_CLINIC_OR_DEPARTMENT_OTHER): Payer: BC Managed Care – PPO | Admitting: Oncology

## 2021-08-17 VITALS — BP 134/90 | HR 70 | Temp 97.7°F | Resp 18 | Ht 66.0 in | Wt 254.1 lb

## 2021-08-17 DIAGNOSIS — E039 Hypothyroidism, unspecified: Secondary | ICD-10-CM | POA: Diagnosis not present

## 2021-08-17 DIAGNOSIS — C641 Malignant neoplasm of right kidney, except renal pelvis: Secondary | ICD-10-CM | POA: Diagnosis not present

## 2021-08-17 DIAGNOSIS — C649 Malignant neoplasm of unspecified kidney, except renal pelvis: Secondary | ICD-10-CM

## 2021-08-17 DIAGNOSIS — Z5112 Encounter for antineoplastic immunotherapy: Secondary | ICD-10-CM | POA: Insufficient documentation

## 2021-08-17 DIAGNOSIS — K754 Autoimmune hepatitis: Secondary | ICD-10-CM | POA: Diagnosis not present

## 2021-08-17 DIAGNOSIS — Z880 Allergy status to penicillin: Secondary | ICD-10-CM | POA: Diagnosis not present

## 2021-08-17 DIAGNOSIS — R7989 Other specified abnormal findings of blood chemistry: Secondary | ICD-10-CM | POA: Diagnosis not present

## 2021-08-17 DIAGNOSIS — Z905 Acquired absence of kidney: Secondary | ICD-10-CM | POA: Insufficient documentation

## 2021-08-17 DIAGNOSIS — C7800 Secondary malignant neoplasm of unspecified lung: Secondary | ICD-10-CM | POA: Insufficient documentation

## 2021-08-17 DIAGNOSIS — Z79899 Other long term (current) drug therapy: Secondary | ICD-10-CM | POA: Insufficient documentation

## 2021-08-17 LAB — CBC WITH DIFFERENTIAL (CANCER CENTER ONLY)
Abs Immature Granulocytes: 0.01 10*3/uL (ref 0.00–0.07)
Basophils Absolute: 0 10*3/uL (ref 0.0–0.1)
Basophils Relative: 1 %
Eosinophils Absolute: 0.2 10*3/uL (ref 0.0–0.5)
Eosinophils Relative: 3 %
HCT: 41 % (ref 39.0–52.0)
Hemoglobin: 14 g/dL (ref 13.0–17.0)
Immature Granulocytes: 0 %
Lymphocytes Relative: 28 %
Lymphs Abs: 1.8 10*3/uL (ref 0.7–4.0)
MCH: 29.4 pg (ref 26.0–34.0)
MCHC: 34.1 g/dL (ref 30.0–36.0)
MCV: 86 fL (ref 80.0–100.0)
Monocytes Absolute: 0.5 10*3/uL (ref 0.1–1.0)
Monocytes Relative: 8 %
Neutro Abs: 3.8 10*3/uL (ref 1.7–7.7)
Neutrophils Relative %: 60 %
Platelet Count: 291 10*3/uL (ref 150–400)
RBC: 4.77 MIL/uL (ref 4.22–5.81)
RDW: 12.4 % (ref 11.5–15.5)
WBC Count: 6.3 10*3/uL (ref 4.0–10.5)
nRBC: 0 % (ref 0.0–0.2)

## 2021-08-17 LAB — CMP (CANCER CENTER ONLY)
ALT: 25 U/L (ref 0–44)
AST: 23 U/L (ref 15–41)
Albumin: 4 g/dL (ref 3.5–5.0)
Alkaline Phosphatase: 97 U/L (ref 38–126)
Anion gap: 9 (ref 5–15)
BUN: 15 mg/dL (ref 6–20)
CO2: 23 mmol/L (ref 22–32)
Calcium: 8.3 mg/dL — ABNORMAL LOW (ref 8.9–10.3)
Chloride: 103 mmol/L (ref 98–111)
Creatinine: 1.29 mg/dL — ABNORMAL HIGH (ref 0.61–1.24)
GFR, Estimated: >60 mL/min (ref >60)
Glucose, Bld: 98 mg/dL (ref 70–99)
Potassium: 3.6 mmol/L (ref 3.5–5.1)
Sodium: 135 mmol/L (ref 135–145)
Total Bilirubin: 0.6 mg/dL (ref 0.3–1.2)
Total Protein: 7.7 g/dL (ref 6.5–8.1)

## 2021-08-17 LAB — TSH: TSH: 1.203 u[IU]/mL (ref 0.320–4.118)

## 2021-08-17 MED ORDER — SODIUM CHLORIDE 0.9 % IV SOLN: Freq: Once | INTRAVENOUS | Status: DC

## 2021-08-17 MED ORDER — SODIUM CHLORIDE 0.9 % IV SOLN
200.0000 mg | Freq: Once | INTRAVENOUS | Status: AC
  Administered 2021-08-17: 200 mg via INTRAVENOUS
  Filled 2021-08-17: qty 8

## 2021-08-17 NOTE — Progress Notes (Signed)
Ok to use CMP from last month per Dr. Alen Blew

## 2021-08-17 NOTE — Progress Notes (Signed)
Hematology and Oncology Follow Up Visit  Michael [name] [id number] 14-Jan-1966 55 y.o. 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  Secondary diagnosis: Left kidney tumors noted in May 2022.  He was found to have 2.8 x 2.8 x 2.7 mm lesion measuring 2.1 x 2.0 x 1.6.   Prior Therapy: He underwent a right nephrectomy on August 27, 2019 performed while he was living in [place].  The final pathology showed clear cell renal cell carcinoma, nuclear grade 3 with 10% necrosis negative margins.  Final pathological stage was T2ANX.  He is status post Pembrolizumab and axitinib started in March 2021.  He received two cycles of therapy.  Therapy was interrupted because of increased LFTs and autoimmune hepatitis.    He has been receiving his therapy in [place] and relocated to this area.  Axitinib 5 mg daily started on July 05, 2020.  Therapy discontinued on July 13, 2020 because of elevated LFTs.  He is status post cryoablation of a left renal mass completed on May 30, 2021.  Current therapy: Pembrolizumab 200 mg every 3 weeks started on July 27, 2021.  Interim History: Michael [name] returns today for a follow-up visit.  Since the last visit, he received the first infusion of Pembrolizumab without any issues.  He denies any nausea, vomiting or abdominal pain.  He denies any skin rash or lesions.  He denies any excessive fatigue or tiredness.  His performance status quality of life remain excellent.    Medications: Reviewed and updated. Current Outpatient Medications  Medication Sig Dispense Refill   amLODipine (NORVASC) 5 MG tablet Take 7.5 mg by mouth in the morning.     cholecalciferol (VITAMIN D3) 25 MCG (1000 UNIT) tablet Take 1,000 Units by mouth in the morning.     levothyroxine (SYNTHROID) 100 MCG tablet TAKE 1 TABLET(100 MCG) BY MOUTH DAILY  BEFORE BREAKFAST 90 tablet 0   montelukast (SINGULAIR) 10 MG tablet Take 10 mg by mouth in the morning.     tadalafil (CIALIS) 20 MG tablet Take 10-20 mg by mouth daily as needed for erectile dysfunction.     No current facility-administered medications for this visit.     Allergies:  Allergies  Allergen Reactions   Penicillins Other (See Comments)    Childhood allergies- unk      Physical Exam:    Blood pressure 134/90, pulse 70, temperature 97.7 F (36.5 C), temperature source Oral, resp. rate 18, height 5\' 6"  (1.676 m), weight 254 lb 1.6 oz (115.3 kg), SpO2 100 %.       ECOG: 0   General appearance: Alert, awake without any distress. Head: Atraumatic without abnormalities Oropharynx: Without any thrush or ulcers. Eyes: No scleral icterus. Lymph nodes: No lymphadenopathy noted in the cervical, supraclavicular, or axillary nodes Heart:regular rate and rhythm, without any murmurs or gallops.   Lung: Clear to auscultation without any rhonchi, wheezes or dullness to percussion. Abdomin: Soft, nontender without any shifting dullness or ascites. Musculoskeletal: No clubbing or cyanosis. Neurological: No motor or sensory deficits. Skin: No rashes or lesions.                Lab Results: Lab Results  Component Value Date   WBC 6.3 08/17/2021   HGB 14.0 08/17/2021   HCT 41.0 08/17/2021   MCV 86.0 08/17/2021   PLT 291 08/17/2021     Chemistry      Component  Value Date/Time   NA 140 07/27/2021 1513   K 3.7 07/27/2021 1513   CL 105 07/27/2021 1513   CO2 22 07/27/2021 1513   BUN 17 07/27/2021 1513   CREATININE 1.33 (H) 07/27/2021 1513      Component Value Date/Time   CALCIUM 9.3 07/27/2021 1513   ALKPHOS 112 07/27/2021 1513   AST 25 07/27/2021 1513   ALT 23 07/27/2021 1513   BILITOT 0.9 07/27/2021 1513           Impression and Plan:  55 year old with:   1.  Stage IV clear-cell renal cell with pulmonary involvement diagnosed in  2020.  He is currently on no single agent Pembrolizumab after he was started with this regimen with axitinib.  Axitinib was was held because of liver function abnormalities.  Risks and benefits of continuing this treatment long-term were discussed.  Alternative treatment options would be to add different oral targeted therapy such as lenvatinib, cabozantinib or combination immunotherapy with ipilimumab and nivolumab.   2.  Hypothyroidism: He is currently on thyroid supplement with TSH close to target.   3.    Left kidney masses: He will continue to follow with Dr. Kathlene Cote regarding future ablation with a residual mass.   4.  Goals of care and prognosis: Therapy remains palliative although aggressive measures are warranted at this time.   5.  Elevated AST and ALT: Related to axitinib and will continue to monitor on Pembrolizumab.   6.  Follow-up: In 3 weeks for repeat evaluation.   30  minutes were dedicated to this visit.  Time spent on reviewing laboratory data, disease status update and outlining future plan of care.    Zola Button, MD 10/14/202210:05 AM

## 2021-08-21 ENCOUNTER — Telehealth: Payer: Self-pay | Admitting: Oncology

## 2021-08-21 NOTE — Telephone Encounter (Signed)
Scheduled per 10/14 los, patient has been called and voicemail was left. 

## 2021-09-07 ENCOUNTER — Inpatient Hospital Stay (HOSPITAL_BASED_OUTPATIENT_CLINIC_OR_DEPARTMENT_OTHER): Payer: BC Managed Care – PPO | Admitting: Oncology

## 2021-09-07 ENCOUNTER — Inpatient Hospital Stay: Payer: BC Managed Care – PPO | Attending: Oncology

## 2021-09-07 ENCOUNTER — Inpatient Hospital Stay: Payer: BC Managed Care – PPO

## 2021-09-07 VITALS — BP 132/89 | HR 72 | Temp 98.2°F | Resp 18

## 2021-09-07 DIAGNOSIS — Z5112 Encounter for antineoplastic immunotherapy: Secondary | ICD-10-CM | POA: Diagnosis not present

## 2021-09-07 DIAGNOSIS — C7800 Secondary malignant neoplasm of unspecified lung: Secondary | ICD-10-CM | POA: Diagnosis not present

## 2021-09-07 DIAGNOSIS — E039 Hypothyroidism, unspecified: Secondary | ICD-10-CM | POA: Diagnosis not present

## 2021-09-07 DIAGNOSIS — Z880 Allergy status to penicillin: Secondary | ICD-10-CM | POA: Diagnosis not present

## 2021-09-07 DIAGNOSIS — C649 Malignant neoplasm of unspecified kidney, except renal pelvis: Secondary | ICD-10-CM

## 2021-09-07 DIAGNOSIS — C641 Malignant neoplasm of right kidney, except renal pelvis: Secondary | ICD-10-CM | POA: Insufficient documentation

## 2021-09-07 DIAGNOSIS — Z905 Acquired absence of kidney: Secondary | ICD-10-CM | POA: Diagnosis not present

## 2021-09-07 DIAGNOSIS — K754 Autoimmune hepatitis: Secondary | ICD-10-CM | POA: Insufficient documentation

## 2021-09-07 LAB — CMP (CANCER CENTER ONLY)
ALT: 23 U/L (ref 0–44)
AST: 25 U/L (ref 15–41)
Albumin: 4.1 g/dL (ref 3.5–5.0)
Alkaline Phosphatase: 117 U/L (ref 38–126)
Anion gap: 11 (ref 5–15)
BUN: 15 mg/dL (ref 6–20)
CO2: 23 mmol/L (ref 22–32)
Calcium: 8.7 mg/dL — ABNORMAL LOW (ref 8.9–10.3)
Chloride: 105 mmol/L (ref 98–111)
Creatinine: 1.28 mg/dL — ABNORMAL HIGH (ref 0.61–1.24)
GFR, Estimated: >60 mL/min (ref >60)
Glucose, Bld: 108 mg/dL — ABNORMAL HIGH (ref 70–99)
Potassium: 3.9 mmol/L (ref 3.5–5.1)
Sodium: 139 mmol/L (ref 135–145)
Total Bilirubin: 0.6 mg/dL (ref 0.3–1.2)
Total Protein: 7.7 g/dL (ref 6.5–8.1)

## 2021-09-07 LAB — CBC WITH DIFFERENTIAL (CANCER CENTER ONLY)
Abs Immature Granulocytes: 0.01 10*3/uL (ref 0.00–0.07)
Basophils Absolute: 0 10*3/uL (ref 0.0–0.1)
Basophils Relative: 1 %
Eosinophils Absolute: 0.2 10*3/uL (ref 0.0–0.5)
Eosinophils Relative: 3 %
HCT: 45 % (ref 39.0–52.0)
Hemoglobin: 15.6 g/dL (ref 13.0–17.0)
Immature Granulocytes: 0 %
Lymphocytes Relative: 23 %
Lymphs Abs: 1.5 10*3/uL (ref 0.7–4.0)
MCH: 29.7 pg (ref 26.0–34.0)
MCHC: 34.7 g/dL (ref 30.0–36.0)
MCV: 85.7 fL (ref 80.0–100.0)
Monocytes Absolute: 0.4 10*3/uL (ref 0.1–1.0)
Monocytes Relative: 7 %
Neutro Abs: 4.4 10*3/uL (ref 1.7–7.7)
Neutrophils Relative %: 66 %
Platelet Count: 268 10*3/uL (ref 150–400)
RBC: 5.25 MIL/uL (ref 4.22–5.81)
RDW: 12.7 % (ref 11.5–15.5)
WBC Count: 6.5 10*3/uL (ref 4.0–10.5)
nRBC: 0 % (ref 0.0–0.2)

## 2021-09-07 LAB — TSH: TSH: 12.54 u[IU]/mL — ABNORMAL HIGH (ref 0.320–4.118)

## 2021-09-07 MED ORDER — SODIUM CHLORIDE 0.9 % IV SOLN
200.0000 mg | Freq: Once | INTRAVENOUS | Status: AC
  Administered 2021-09-07: 200 mg via INTRAVENOUS
  Filled 2021-09-07: qty 8

## 2021-09-07 MED ORDER — SODIUM CHLORIDE 0.9 % IV SOLN: Freq: Once | INTRAVENOUS | Status: AC

## 2021-09-07 NOTE — Patient Instructions (Signed)
[facility]  Discharge Instructions: Thank you for choosing [facility] to provide your oncology and hematology care.   If you have a lab appointment with the Cancer Center, please go directly to the Cancer Center and check in at the registration area.   Wear comfortable clothing and clothing appropriate for easy access to any Portacath or PICC line.   We strive to give you quality time with your provider. You may need to reschedule your appointment if you arrive late (15 or more minutes).  Arriving late affects you and other patients whose appointments are after yours.  Also, if you miss three or more appointments without notifying the office, you may be dismissed from the clinic at the provider's discretion.      For prescription refill requests, have your pharmacy contact our office and allow 72 hours for refills to be completed.    Today you received the following chemotherapy and/or immunotherapy agents Keytruda      To help prevent nausea and vomiting after your treatment, we encourage you to take your nausea medication as directed.  BELOW ARE SYMPTOMS THAT SHOULD BE REPORTED IMMEDIATELY: *FEVER GREATER THAN 100.4 F (38 C) OR HIGHER *CHILLS OR SWEATING *NAUSEA AND VOMITING THAT IS NOT CONTROLLED WITH YOUR NAUSEA MEDICATION *UNUSUAL SHORTNESS OF BREATH *UNUSUAL BRUISING OR BLEEDING *URINARY PROBLEMS (pain or burning when urinating, or frequent urination) *BOWEL PROBLEMS (unusual diarrhea, constipation, pain near the anus) TENDERNESS IN MOUTH AND THROAT WITH OR WITHOUT PRESENCE OF ULCERS (sore throat, sores in mouth, or a toothache) UNUSUAL RASH, SWELLING OR PAIN  UNUSUAL VAGINAL DISCHARGE OR ITCHING   Items with * indicate a potential emergency and should be followed up as soon as possible or go to the Emergency Department if any problems should occur.  Please show the CHEMOTHERAPY ALERT CARD or IMMUNOTHERAPY ALERT CARD at check-in to  the Emergency Department and triage nurse.  Should you have questions after your visit or need to cancel or reschedule your appointment, please contact [facility]  Dept: [phone]  and follow the prompts.  Office hours are 8:00 a.m. to 4:30 p.m. Monday - Friday. Please note that voicemails left after 4:00 p.m. may not be returned until the following business day.  We are closed weekends and major holidays. You have access to a nurse at all times for urgent questions. Please call the main number to the clinic Dept: [phone] and follow the prompts.   For any non-urgent questions, you may also contact your provider using MyChart. We now offer e-Visits for anyone 18 and older to request care online for non-urgent symptoms. For details visit [email or web address].   Also download the MyChart app! Go to the app store, search "MyChart", open the app, select [facility], and log in with your MyChart username and password.  Due to Covid, a mask is required upon entering the hospital/clinic. If you do not have a mask, one will be given to you upon arrival. For doctor visits, patients may have 1 support person aged 18 or older with them. For treatment visits, patients cannot have anyone with them due to current Covid guidelines and our immunocompromised population.   Pembrolizumab injection What is this medication? PEMBROLIZUMAB (pem broe liz ue mab) is a monoclonal antibody. It is used totreat certain types of cancer. This medicine may be used for other purposes; ask your health care provider orpharmacist if you have questions. COMMON BRAND NAME(S): Keytruda What should I   tell my care team before I take this medication? They need to know if you have any of these conditions: autoimmune diseases like Crohn's disease, ulcerative colitis, or lupus have had or planning to have an allogeneic stem cell transplant (uses someone else's stem cells) history of organ  transplant history of chest radiation nervous system problems like myasthenia gravis or Guillain-Barre syndrome an unusual or allergic reaction to pembrolizumab, other medicines, foods, dyes, or preservatives pregnant or trying to get pregnant breast-feeding How should I use this medication? This medicine is for infusion into a vein. It is given by a health careprofessional in a hospital or clinic setting. A special MedGuide will be given to you before each treatment. Be sure to readthis information carefully each time. Talk to your pediatrician regarding the use of this medicine in children. While this drug may be prescribed for children as young as 6 months for selectedconditions, precautions do apply. Overdosage: If you think you have taken too much of this medicine contact apoison control center or emergency room at once. NOTE: This medicine is only for you. Do not share this medicine with others. What if I miss a dose? It is important not to miss your dose. Call your doctor or health careprofessional if you are unable to keep an appointment. What may interact with this medication? Interactions have not been studied. This list may not describe all possible interactions. Give your health care provider a list of all the medicines, herbs, non-prescription drugs, or dietary supplements you use. Also tell them if you smoke, drink alcohol, or use illegaldrugs. Some items may interact with your medicine. What should I watch for while using this medication? Your condition will be monitored carefully while you are receiving thismedicine. You may need blood work done while you are taking this medicine. Do not become pregnant while taking this medicine or for 4 months after stopping it. Women should inform their doctor if they wish to become pregnant or think they might be pregnant. There is a potential for serious side effects to an unborn child. Talk to your health care professional or pharmacist for  more information. Do not breast-feed an infant while taking this medicine orfor 4 months after the last dose. What side effects may I notice from receiving this medication? Side effects that you should report to your doctor or health care professionalas soon as possible: allergic reactions like skin rash, itching or hives, swelling of the face, lips, or tongue bloody or black, tarry breathing problems changes in vision chest pain chills confusion constipation cough diarrhea dizziness or feeling faint or lightheaded fast or irregular heartbeat fever flushing joint pain low blood counts - this medicine may decrease the number of white blood cells, red blood cells and platelets. You may be at increased risk for infections and bleeding. muscle pain muscle weakness pain, tingling, numbness in the hands or feet persistent headache redness, blistering, peeling or loosening of the skin, including inside the mouth signs and symptoms of high blood sugar such as dizziness; dry mouth; dry skin; fruity breath; nausea; stomach pain; increased hunger or thirst; increased urination signs and symptoms of kidney injury like trouble passing urine or change in the amount of urine signs and symptoms of liver injury like dark urine, light-colored stools, loss of appetite, nausea, right upper belly pain, yellowing of the eyes or skin sweating swollen lymph nodes weight loss Side effects that usually do not require medical attention (report to yourdoctor or health care professional if they continue   or are bothersome): decreased appetite hair loss tiredness This list may not describe all possible side effects. Call your doctor for medical advice about side effects. You may report side effects to FDA at1-800-FDA-1088. Where should I keep my medication? This drug is given in a hospital or clinic and will not be stored at home. NOTE: This sheet is a summary. It may not cover all possible information. If you  have questions about this medicine, talk to your doctor, pharmacist, orhealth care provider.  2022 Elsevier/Gold Standard (2019-09-22 21:44:53)   

## 2021-09-07 NOTE — Progress Notes (Signed)
Hematology and Oncology Follow Up Visit  Michael [name] [id number] 1966-03-01 55 y.o. 09/07/2021 12:14 PM [name], MDRusso, [name], MD   Principle Diagnosis: 55 year old man with kidney cancer diagnosed in 2020.  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  Secondary diagnosis: Left kidney tumors noted in May 2022.  He was found to have 2.8 x 2.8 x 2.7 mm lesion measuring 2.1 x 2.0 x 1.6.   Prior Therapy: He underwent a right nephrectomy on August 27, 2019 performed while he was living in [place].  The final pathology showed clear cell renal cell carcinoma, nuclear grade 3 with 10% necrosis negative margins.  Final pathological stage was T2ANX.  He is status post Pembrolizumab and axitinib started in March 2021.  He received two cycles of therapy.  Therapy was interrupted because of increased LFTs and autoimmune hepatitis.    He has been receiving his therapy in [place] and relocated to this area.  Axitinib 5 mg daily started on July 05, 2020.  Therapy discontinued on July 13, 2020 because of elevated LFTs.  He is status post cryoablation of a left renal mass completed on May 30, 2021.  Current therapy: Pembrolizumab 200 mg every 3 weeks started on July 27, 2021.  He is here for cycle 3 of therapy.  Interim History: Michael [name] presents today for repeat evaluation with his wife.  Since the last visit, he reports feeling well without any complaints.  He continues to tolerate treatment without any complaints.  He denies any skin rashes or lesions.  He denies shortness of breath or difficulty breathing.  He denies any hospitalizations or illnesses.    Medications: Updated on review. Current Outpatient Medications  Medication Sig Dispense Refill   amLODipine (NORVASC) 5 MG tablet Take 7.5 mg by mouth in the morning.     cholecalciferol (VITAMIN D3) 25 MCG (1000 UNIT) tablet Take 1,000 Units by mouth in the morning.      levothyroxine (SYNTHROID) 100 MCG tablet TAKE 1 TABLET(100 MCG) BY MOUTH DAILY BEFORE BREAKFAST 90 tablet 0   montelukast (SINGULAIR) 10 MG tablet Take 10 mg by mouth in the morning.     tadalafil (CIALIS) 20 MG tablet Take 10-20 mg by mouth daily as needed for erectile dysfunction.     No current facility-administered medications for this visit.     Allergies:  Allergies  Allergen Reactions   Penicillins Other (See Comments)    Childhood allergies- unk      Physical Exam:           ECOG: 0    General appearance: Comfortable appearing without any discomfort Head: Normocephalic without any trauma Oropharynx: Mucous membranes are moist and pink without any thrush or ulcers. Eyes: Pupils are equal and round reactive to light. Lymph nodes: No cervical, supraclavicular, inguinal or axillary lymphadenopathy.   Heart:regular rate and rhythm.  S1 and S2 without leg edema. Lung: Clear without any rhonchi or wheezes.  No dullness to percussion. Abdomin: Soft, nontender, nondistended with good bowel sounds.  No hepatosplenomegaly. Musculoskeletal: No joint deformity or effusion.  Full range of motion noted. Neurological: No deficits noted on motor, sensory and deep tendon reflex exam. Skin: No petechial rash or dryness.  Appeared moist.                  Lab Results: Lab Results  Component Value Date   WBC 6.5 09/07/2021   HGB 15.6 09/07/2021   HCT 45.0 09/07/2021   MCV 85.7  09/07/2021   PLT 268 09/07/2021     Chemistry      Component Value Date/Time   NA 139 09/07/2021 1134   K 3.9 09/07/2021 1134   CL 105 09/07/2021 1134   CO2 23 09/07/2021 1134   BUN 15 09/07/2021 1134   CREATININE 1.28 (H) 09/07/2021 1134      Component Value Date/Time   CALCIUM 8.7 (L) 09/07/2021 1134   ALKPHOS 117 09/07/2021 1134   AST 25 09/07/2021 1134   ALT 23 09/07/2021 1134   BILITOT 0.6 09/07/2021 1134           Impression and Plan:  55 year old with:    1.  Kidney cancer diagnosed in 2020.  He developed stage IV clear-cell tumor with pulmonary metastasis.  Treatment choices moving forward were discussed at this time.  He continues to tolerate Pembrolizumab without any major complaints.  The plan is to update his staging scans in December.  Additional therapy including adding cabozantinib among other salvage treatments were discussed.   2.  Hypothyroidism: We will continue to replace as needed and monitor TSH.   3.    Left kidney masses: He is status post ablation and repeat treatment may be required.   4.  Goals of care and prognosis: His disease is incurable although aggressive measures are warranted given his reasonable performance status.   5.  Elevated AST and ALT: Resolved at this time off Inlyta.   6.  Follow-up: In 3 weeks for Pembrolizumab and in 6 weeks for MD follow-up.   30  minutes were spent on this encounter.  The time was dedicated to reviewing his disease status, treatment choices for plan of care discussion.    Zola Button, MD 11/4/202212:14 PM    0 change since

## 2021-09-25 DIAGNOSIS — C641 Malignant neoplasm of right kidney, except renal pelvis: Secondary | ICD-10-CM | POA: Diagnosis not present

## 2021-09-25 DIAGNOSIS — E039 Hypothyroidism, unspecified: Secondary | ICD-10-CM | POA: Diagnosis not present

## 2021-09-25 DIAGNOSIS — I129 Hypertensive chronic kidney disease with stage 1 through stage 4 chronic kidney disease, or unspecified chronic kidney disease: Secondary | ICD-10-CM | POA: Diagnosis not present

## 2021-09-25 DIAGNOSIS — N1831 Chronic kidney disease, stage 3a: Secondary | ICD-10-CM | POA: Diagnosis not present

## 2021-09-28 ENCOUNTER — Other Ambulatory Visit: Payer: Self-pay

## 2021-09-28 ENCOUNTER — Inpatient Hospital Stay: Payer: BC Managed Care – PPO

## 2021-09-28 VITALS — BP 122/91 | HR 62 | Temp 98.6°F | Resp 18 | Ht 66.0 in | Wt 259.0 lb

## 2021-09-28 DIAGNOSIS — Z905 Acquired absence of kidney: Secondary | ICD-10-CM | POA: Diagnosis not present

## 2021-09-28 DIAGNOSIS — C649 Malignant neoplasm of unspecified kidney, except renal pelvis: Secondary | ICD-10-CM

## 2021-09-28 DIAGNOSIS — Z5112 Encounter for antineoplastic immunotherapy: Secondary | ICD-10-CM | POA: Diagnosis not present

## 2021-09-28 DIAGNOSIS — C7800 Secondary malignant neoplasm of unspecified lung: Secondary | ICD-10-CM | POA: Diagnosis not present

## 2021-09-28 DIAGNOSIS — E039 Hypothyroidism, unspecified: Secondary | ICD-10-CM | POA: Diagnosis not present

## 2021-09-28 DIAGNOSIS — Z880 Allergy status to penicillin: Secondary | ICD-10-CM | POA: Diagnosis not present

## 2021-09-28 DIAGNOSIS — K754 Autoimmune hepatitis: Secondary | ICD-10-CM | POA: Diagnosis not present

## 2021-09-28 DIAGNOSIS — C641 Malignant neoplasm of right kidney, except renal pelvis: Secondary | ICD-10-CM | POA: Diagnosis not present

## 2021-09-28 LAB — CMP (CANCER CENTER ONLY)
ALT: 27 U/L (ref 0–44)
AST: 30 U/L (ref 15–41)
Albumin: 4.1 g/dL (ref 3.5–5.0)
Alkaline Phosphatase: 97 U/L (ref 38–126)
Anion gap: 10 (ref 5–15)
BUN: 18 mg/dL (ref 6–20)
CO2: 22 mmol/L (ref 22–32)
Calcium: 8.5 mg/dL — ABNORMAL LOW (ref 8.9–10.3)
Chloride: 108 mmol/L (ref 98–111)
Creatinine: 1.37 mg/dL — ABNORMAL HIGH (ref 0.61–1.24)
GFR, Estimated: >60 mL/min (ref >60)
Glucose, Bld: 110 mg/dL — ABNORMAL HIGH (ref 70–99)
Potassium: 3.9 mmol/L (ref 3.5–5.1)
Sodium: 140 mmol/L (ref 135–145)
Total Bilirubin: 0.7 mg/dL (ref 0.3–1.2)
Total Protein: 7.4 g/dL (ref 6.5–8.1)

## 2021-09-28 LAB — CBC WITH DIFFERENTIAL (CANCER CENTER ONLY)
Abs Immature Granulocytes: 0.02 10*3/uL (ref 0.00–0.07)
Basophils Absolute: 0 10*3/uL (ref 0.0–0.1)
Basophils Relative: 1 %
Eosinophils Absolute: 0.2 10*3/uL (ref 0.0–0.5)
Eosinophils Relative: 4 %
HCT: 40.9 % (ref 39.0–52.0)
Hemoglobin: 14.3 g/dL (ref 13.0–17.0)
Immature Granulocytes: 0 %
Lymphocytes Relative: 33 %
Lymphs Abs: 1.8 10*3/uL (ref 0.7–4.0)
MCH: 30 pg (ref 26.0–34.0)
MCHC: 35 g/dL (ref 30.0–36.0)
MCV: 85.7 fL (ref 80.0–100.0)
Monocytes Absolute: 0.4 10*3/uL (ref 0.1–1.0)
Monocytes Relative: 8 %
Neutro Abs: 3 10*3/uL (ref 1.7–7.7)
Neutrophils Relative %: 54 %
Platelet Count: 257 10*3/uL (ref 150–400)
RBC: 4.77 MIL/uL (ref 4.22–5.81)
RDW: 13 % (ref 11.5–15.5)
WBC Count: 5.5 10*3/uL (ref 4.0–10.5)
nRBC: 0 % (ref 0.0–0.2)

## 2021-09-28 LAB — TSH: TSH: 22.729 u[IU]/mL — ABNORMAL HIGH (ref 0.320–4.118)

## 2021-09-28 MED ORDER — SODIUM CHLORIDE 0.9 % IV SOLN: Freq: Once | INTRAVENOUS | Status: AC

## 2021-09-28 MED ORDER — SODIUM CHLORIDE 0.9 % IV SOLN
200.0000 mg | Freq: Once | INTRAVENOUS | Status: AC
  Administered 2021-09-28: 200 mg via INTRAVENOUS
  Filled 2021-09-28: qty 8

## 2021-09-28 NOTE — Patient Instructions (Signed)
[facility]   ?Discharge Instructions: ?Thank you for choosing [facility] to provide your oncology and hematology care.  ? ?If you have a lab appointment with the Cancer Center, please go directly to the Cancer Center and check in at the registration area. ?  ?Wear comfortable clothing and clothing appropriate for easy access to any Portacath or PICC line.  ? ?We strive to give you quality time with your provider. You may need to reschedule your appointment if you arrive late (15 or more minutes).  Arriving late affects you and other patients whose appointments are after yours.  Also, if you miss three or more appointments without notifying the office, you may be dismissed from the clinic at the provider?s discretion.    ?  ?For prescription refill requests, have your pharmacy contact our office and allow 72 hours for refills to be completed.   ? ?Today you received the following chemotherapy and/or immunotherapy agents: pembrolizumab    ?  ?To help prevent nausea and vomiting after your treatment, we encourage you to take your nausea medication as directed. ? ?BELOW ARE SYMPTOMS THAT SHOULD BE REPORTED IMMEDIATELY: ?*FEVER GREATER THAN 100.4 F (38 ?C) OR HIGHER ?*CHILLS OR SWEATING ?*NAUSEA AND VOMITING THAT IS NOT CONTROLLED WITH YOUR NAUSEA MEDICATION ?*UNUSUAL SHORTNESS OF BREATH ?*UNUSUAL BRUISING OR BLEEDING ?*URINARY PROBLEMS (pain or burning when urinating, or frequent urination) ?*BOWEL PROBLEMS (unusual diarrhea, constipation, pain near the anus) ?TENDERNESS IN MOUTH AND THROAT WITH OR WITHOUT PRESENCE OF ULCERS (sore throat, sores in mouth, or a toothache) ?UNUSUAL RASH, SWELLING OR PAIN  ?UNUSUAL VAGINAL DISCHARGE OR ITCHING  ? ?Items with * indicate a potential emergency and should be followed up as soon as possible or go to the Emergency Department if any problems should occur. ? ?Please show the CHEMOTHERAPY ALERT CARD or IMMUNOTHERAPY ALERT CARD at  check-in to the Emergency Department and triage nurse. ? ?Should you have questions after your visit or need to cancel or reschedule your appointment, please contact [facility]  Dept: [phone]  and follow the prompts.  Office hours are 8:00 a.m. to 4:30 p.m. Monday - Friday. Please note that voicemails left after 4:00 p.m. may not be returned until the following business day.  We are closed weekends and major holidays. You have access to a nurse at all times for urgent questions. Please call the main number to the clinic Dept: [phone] and follow the prompts. ? ? ?For any non-urgent questions, you may also contact your provider using MyChart. We now offer e-Visits for anyone 18 and older to request care online for non-urgent symptoms. For details visit [email or web address]. ?  ?Also download the MyChart app! Go to the app store, search "MyChart", open the app, select , and log in with your MyChart username and password. ? ?Due to Covid, a mask is required upon entering the hospital/clinic. If you do not have a mask, one will be given to you upon arrival. For doctor visits, patients may have 1 support person aged 18 or older with them. For treatment visits, patients cannot have anyone with them due to current Covid guidelines and our immunocompromised population.  ? ?

## 2021-10-15 ENCOUNTER — Other Ambulatory Visit: Payer: Self-pay

## 2021-10-15 ENCOUNTER — Ambulatory Visit (HOSPITAL_COMMUNITY)
Admission: RE | Admit: 2021-10-15 | Discharge: 2021-10-15 | Disposition: A | Discharge status: Home / Self Care | Payer: BC Managed Care – PPO | Source: Ambulatory Visit | Attending: Oncology | Admitting: Oncology

## 2021-10-15 DIAGNOSIS — C649 Malignant neoplasm of unspecified kidney, except renal pelvis: Secondary | ICD-10-CM | POA: Diagnosis not present

## 2021-10-15 DIAGNOSIS — N3289 Other specified disorders of bladder: Secondary | ICD-10-CM | POA: Diagnosis not present

## 2021-10-15 DIAGNOSIS — K7689 Other specified diseases of liver: Secondary | ICD-10-CM | POA: Diagnosis not present

## 2021-10-15 DIAGNOSIS — R918 Other nonspecific abnormal finding of lung field: Secondary | ICD-10-CM | POA: Diagnosis not present

## 2021-10-15 DIAGNOSIS — R911 Solitary pulmonary nodule: Secondary | ICD-10-CM | POA: Diagnosis not present

## 2021-10-15 DIAGNOSIS — I700 Atherosclerosis of aorta: Secondary | ICD-10-CM | POA: Diagnosis not present

## 2021-10-15 IMAGING — CT CT CHEST W/O CM
2 of 4 series · 13 of 36 positions shown, 16 images · non-contrast
Comparison: [DATE]

CLINICAL DATA: Metastatic renal cell carcinoma, status post right
nephrectomy, status post left cryoablation, pulmonary metastases,
ongoing treatment, assess treatment response

EXAM:
CT CHEST, ABDOMEN AND PELVIS WITHOUT CONTRAST
TECHNIQUE: Multidetector CT imaging of the chest, abdomen and pelvis was
performed following the standard protocol without IV contrast.

[Series 2: cap w/o · axial · non-contrast · 0.86mm/px · z∈[+1191,+1746]mm · 10 of 133 slices shown, 13 images]
[im 11/133  mediastinal]
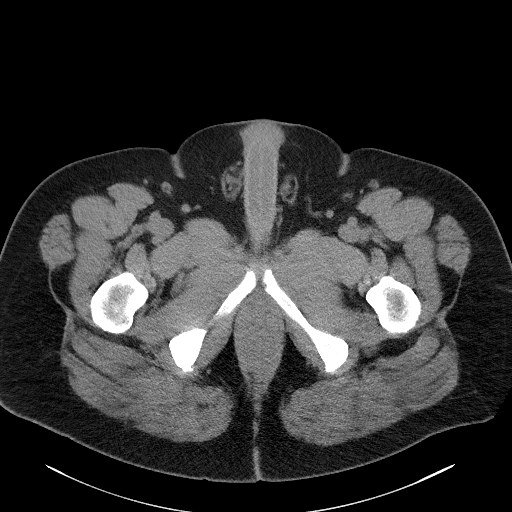
[im 11/133  lung]
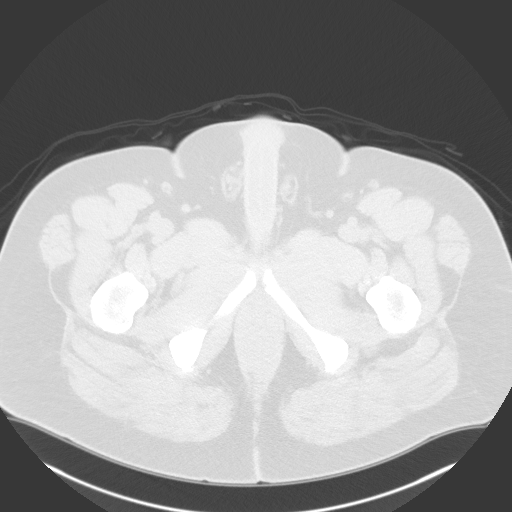
[im 21/133  lung]
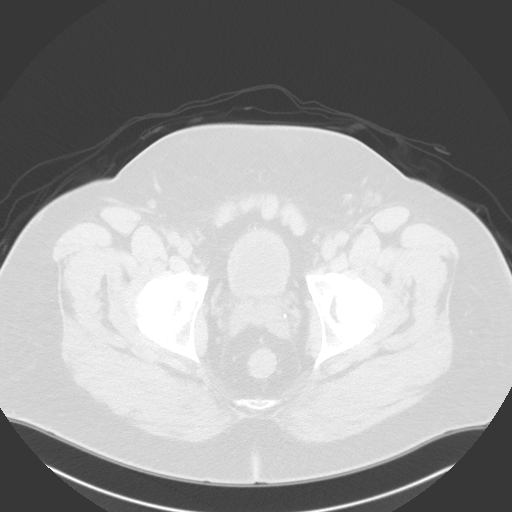
[im 41/133  lung]
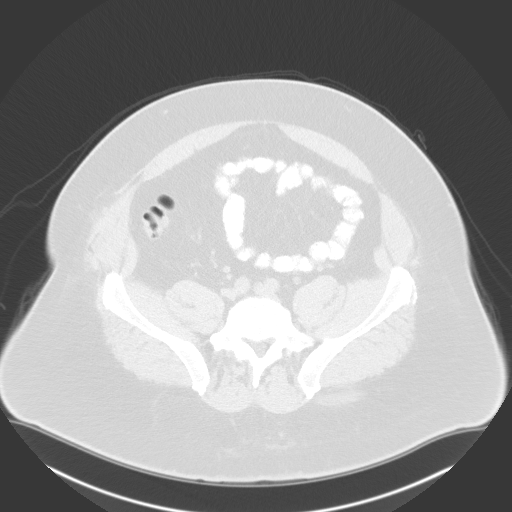
[im 51/133  lung]
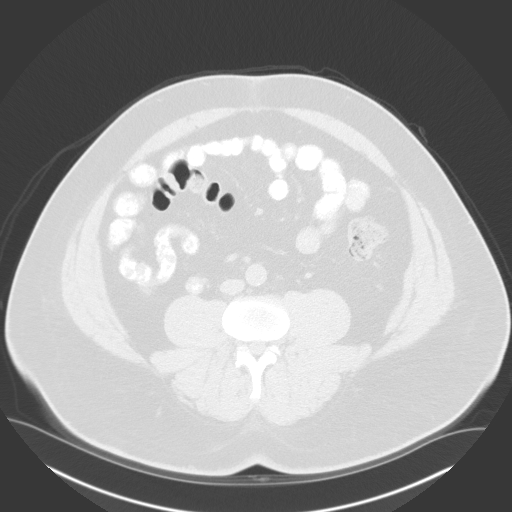
[im 61/133  mediastinal]
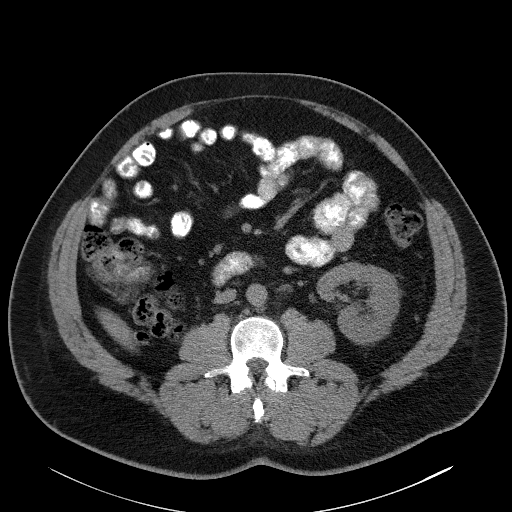
[im 61/133  lung]
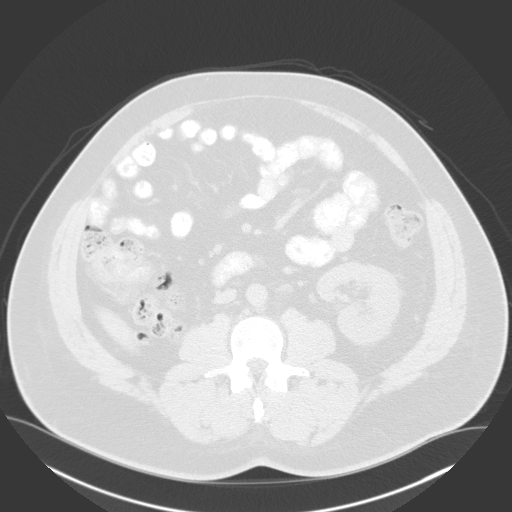
[im 72/133  lung]
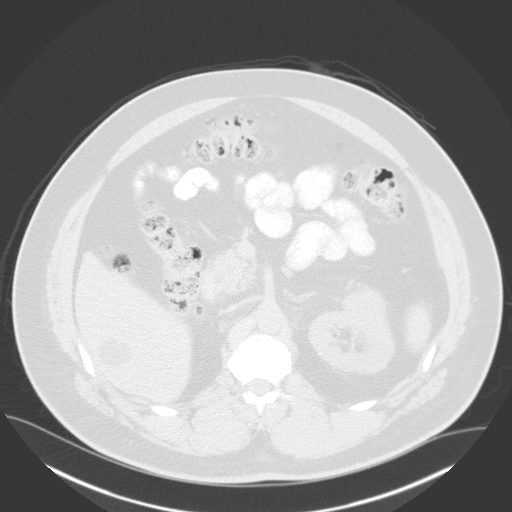
[im 82/133  lung]
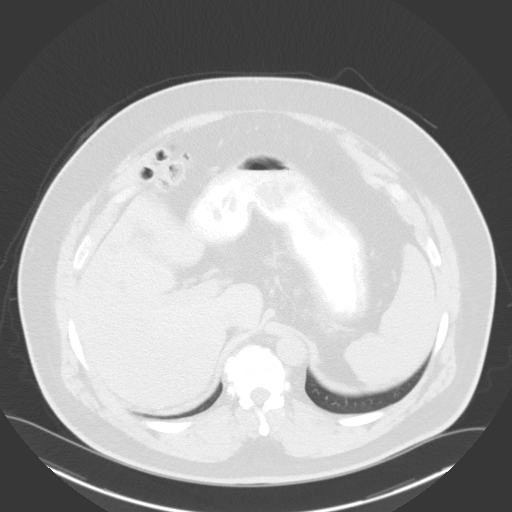
[im 102/133  lung]
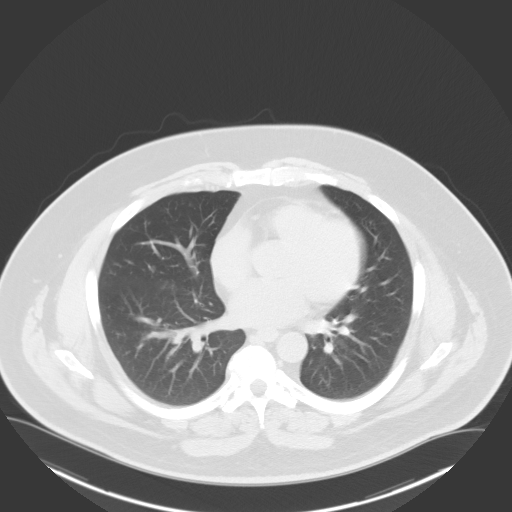
[im 112/133  mediastinal]
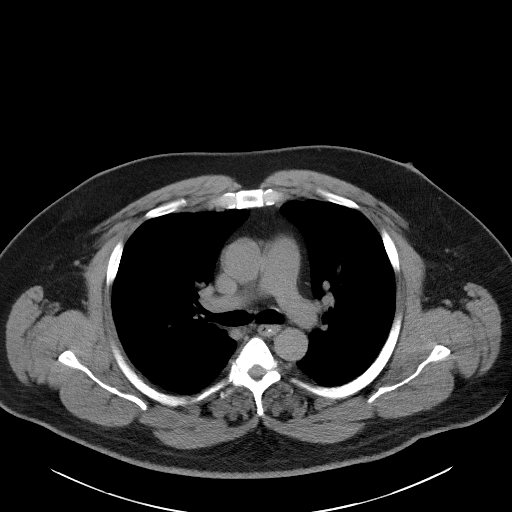
[im 112/133  lung]
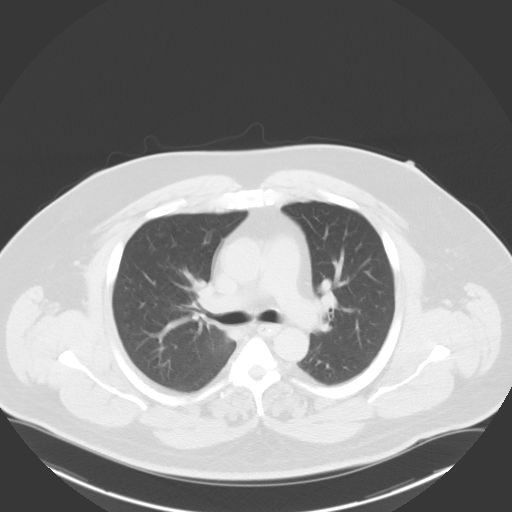
[im 122/133  lung]
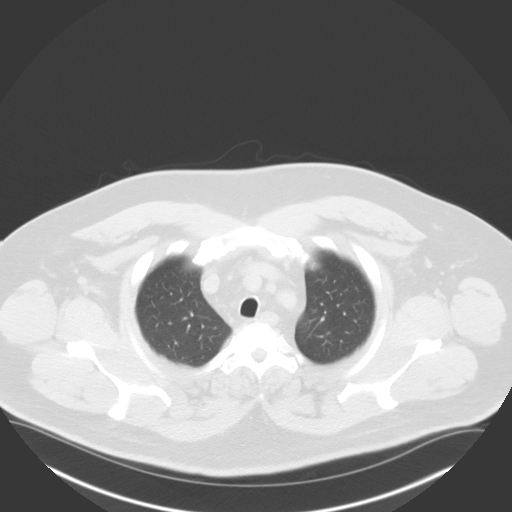

[Series 5: coronals · coronal · 0.98mm/px · 3 of 161 slices shown]
[im 33/161  lung]
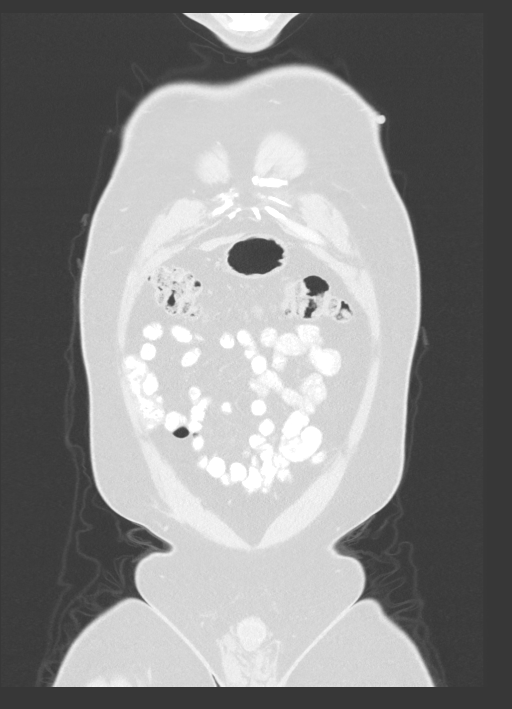
[im 65/161  lung]
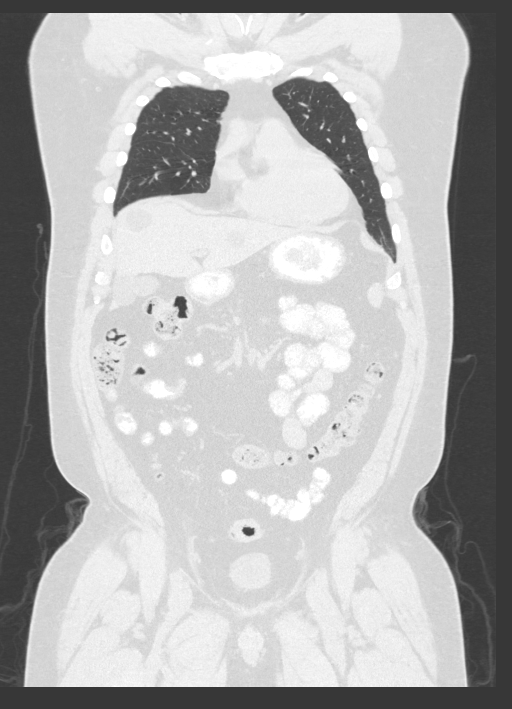
[im 97/161  lung]
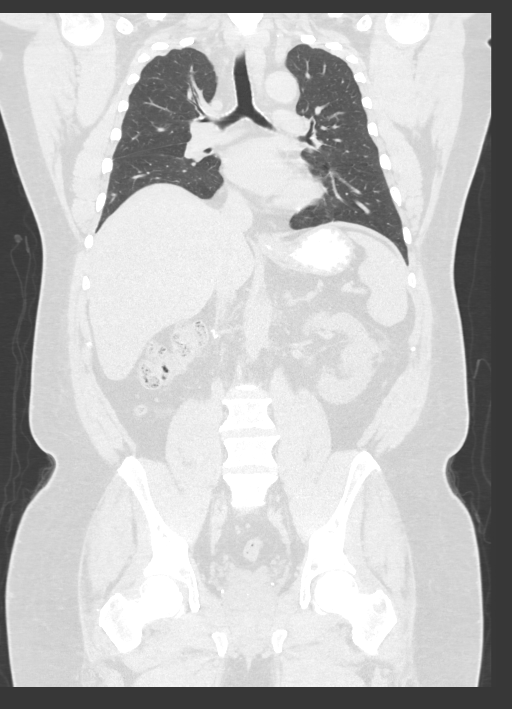

[13 of 36 positions shown; findings below may reference images not displayed]

FINDINGS: CT CHEST FINDINGS

Cardiovascular: Aortic atherosclerosis. Normal heart size. No
pericardial effusion.

Mediastinum/Nodes: No enlarged mediastinal, hilar, or axillary lymph
nodes. Thyroid gland, trachea, and esophagus demonstrate no
significant findings.

Lungs/Pleura: Multiple small bilateral pulmonary nodules are stable
or slightly diminished compared to prior examination, for example a
subpleural nodule of the dependent right lower lobe measures no
greater than 0.2 cm, previously 0.4 cm (series 4, image 79) and
small adjacent nodules of the left lung base measure no greater than
0.2 cm, previously 0.3 cm (series 4, image 95). Other small nodules
are not significantly changed, for example a 0.5 cm nodule of the
right lower lobe (series 4, image 79) and a 0.4 cm nodule of the
anterior left upper lobe (series 4, image 30). No pleural effusion
or pneumothorax.

Musculoskeletal: No chest wall mass or suspicious bone lesions
identified.

CT ABDOMEN PELVIS FINDINGS

Hepatobiliary: No solid liver abnormality is seen. Multiple fluid
attenuation cystic lesions of the liver are unchanged. No
gallstones, gallbladder wall thickening, or biliary dilatation.

Pancreas: Unremarkable. No pancreatic ductal dilatation or
surrounding inflammatory changes.

Spleen: Normal in size without significant abnormality.

Adrenals/Urinary Tract: Adrenal glands are unremarkable. Status post
right nephrectomy. No noncontrast evidence of recurrent soft tissue
in the right nephrectomy bed. Interval decrease in size of a lesion
of the midportion of the left kidney, measuring 3.8 x 2.8 cm,
previously 5.5 x 3.2 cm when measured similarly (series 2, image
68). Generally unchanged adjacent fat stranding and necrosis. Mild
thickening of the urinary bladder wall.

Stomach/Bowel: Stomach is within normal limits. Appendix appears
normal. No evidence of bowel wall thickening, distention, or
inflammatory changes. Sigmoid diverticula.

Vascular/Lymphatic: No significant vascular findings are present. No
enlarged abdominal or pelvic lymph nodes.

Reproductive: Mild prostatomegaly.

Other: No abdominal wall hernia or abnormality. No abdominopelvic
ascites.

Musculoskeletal: No acute or significant osseous findings.
IMPRESSION: 1. Multiple small bilateral pulmonary nodules are stable or slightly
diminished compared to prior examination, consistent with slightly
improved pulmonary metastatic disease.
2. Status post right nephrectomy. No noncontrast evidence of
recurrent soft tissue in the right nephrectomy bed.
3. Interval decrease in size of a lesion of the midportion of the
left kidney with adjacent fat stranding and necrosis, consistent
with expected evolution following cryoablation.
4. No noncontrast CT evidence of lymphadenopathy or metastatic
disease in the abdomen or pelvis.

Aortic Atherosclerosis ([U9]-[U9]).

## 2021-10-15 IMAGING — CT CT ABD-PELV W/O CM
2 of 4 series · 13 of 36 positions shown, 16 images · non-contrast
Comparison: [DATE]

CLINICAL DATA: Metastatic renal cell carcinoma, status post right
nephrectomy, status post left cryoablation, pulmonary metastases,
ongoing treatment, assess treatment response

EXAM:
CT CHEST, ABDOMEN AND PELVIS WITHOUT CONTRAST
TECHNIQUE: Multidetector CT imaging of the chest, abdomen and pelvis was
performed following the standard protocol without IV contrast.

[Series 2: cap w/o · axial · non-contrast · 0.86mm/px · z∈[+1191,+1746]mm · 10 of 133 slices shown, 13 images]
[im 11/133  mediastinal]
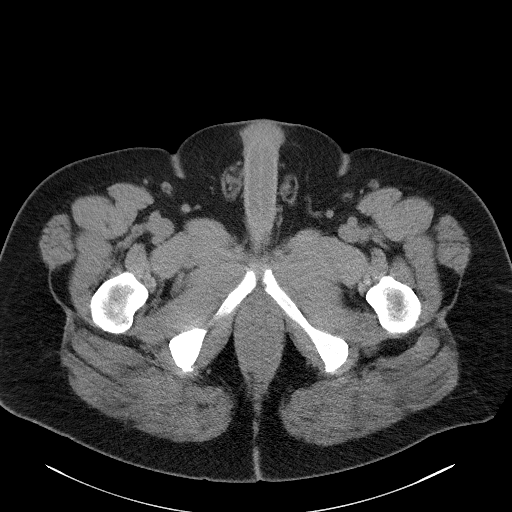
[im 11/133  lung]
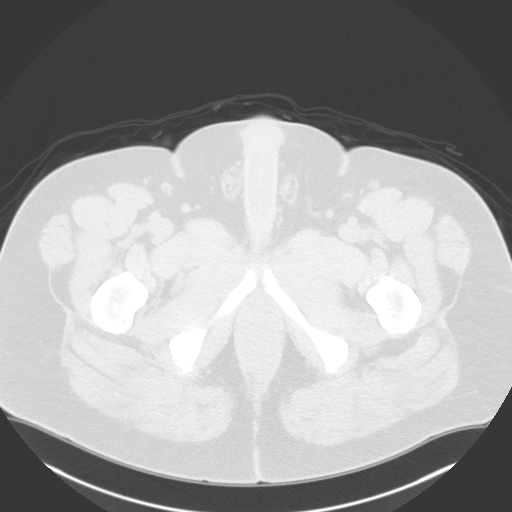
[im 21/133  lung]
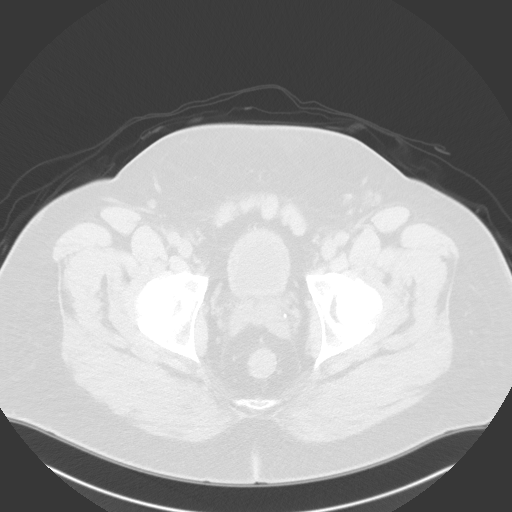
[im 41/133  lung]
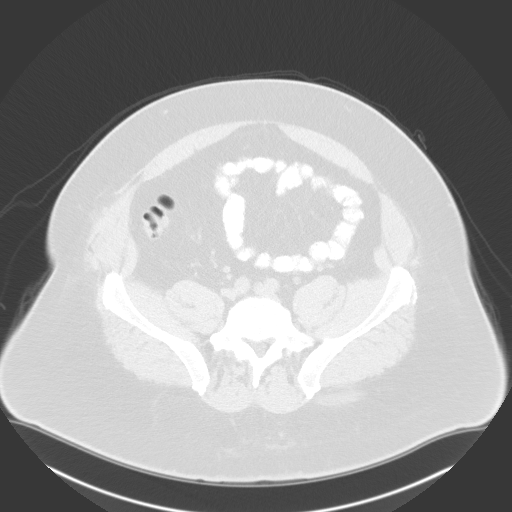
[im 51/133  lung]
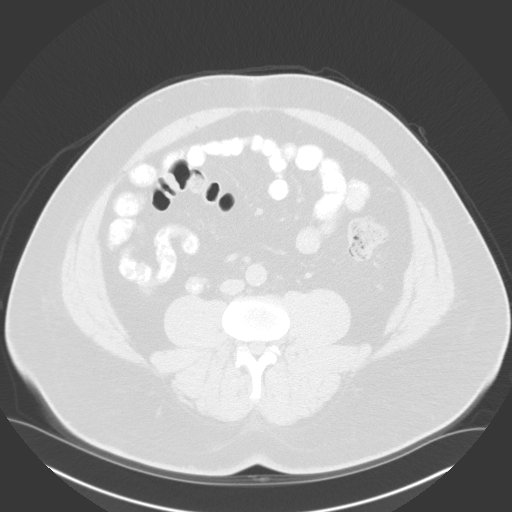
[im 61/133  mediastinal]
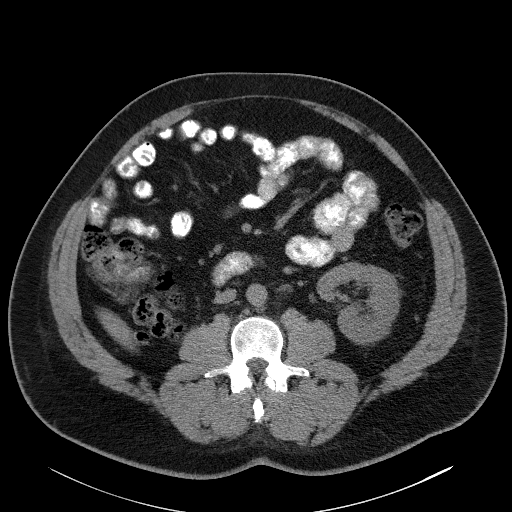
[im 61/133  lung]
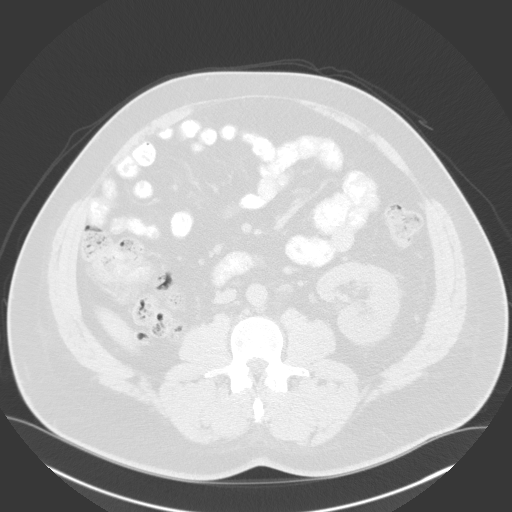
[im 72/133  lung]
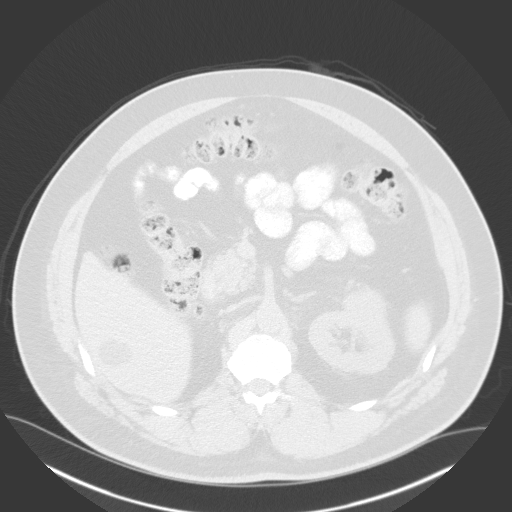
[im 82/133  lung]
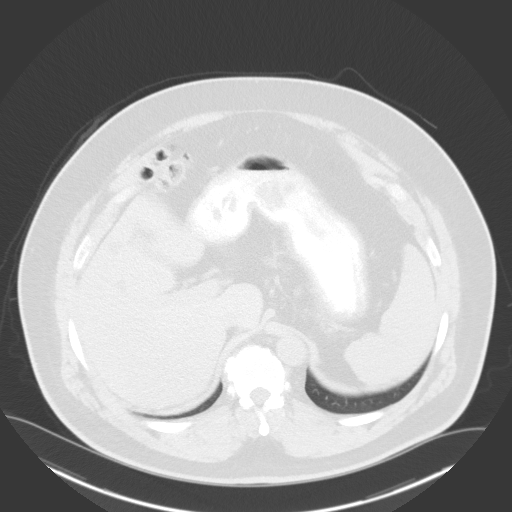
[im 102/133  lung]
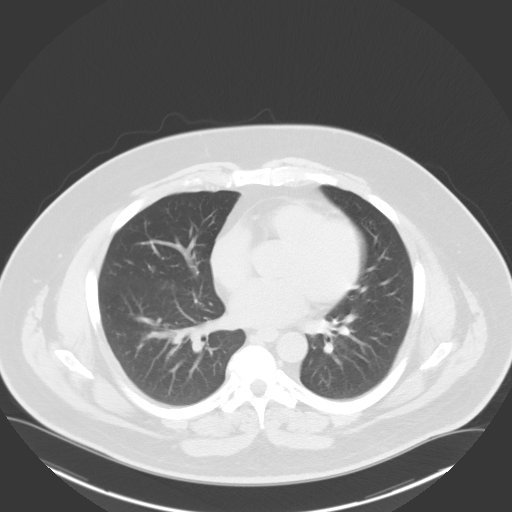
[im 112/133  mediastinal]
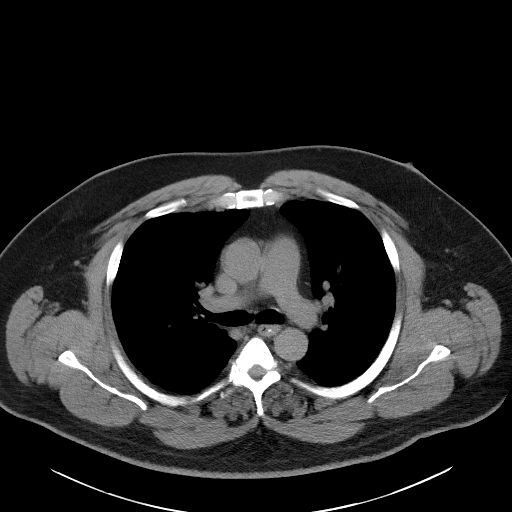
[im 112/133  lung]
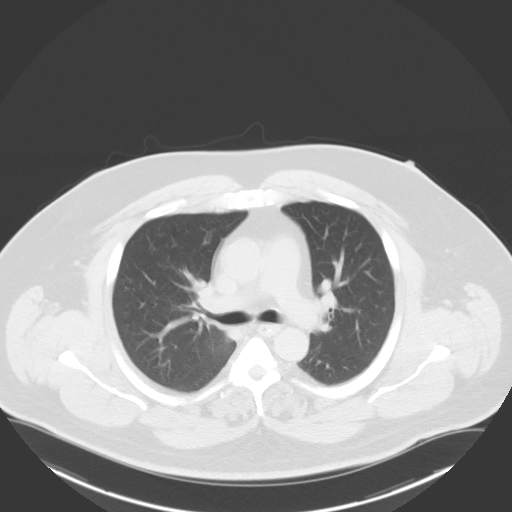
[im 122/133  lung]
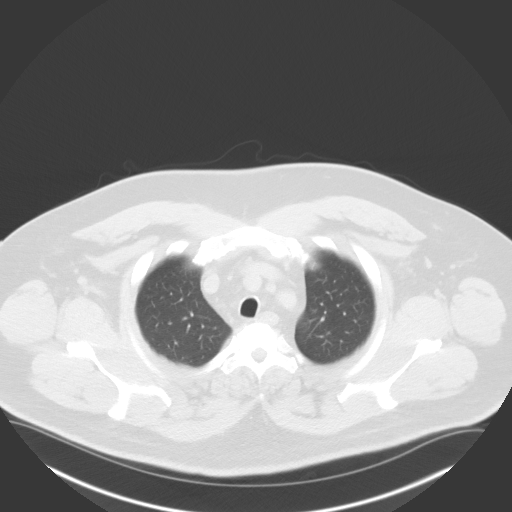

[Series 5: coronals · coronal · 0.98mm/px · 3 of 161 slices shown]
[im 33/161  lung]
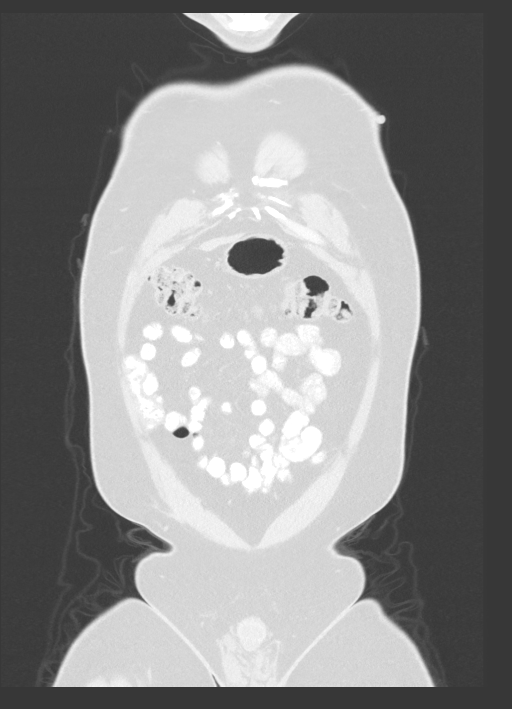
[im 65/161  lung]
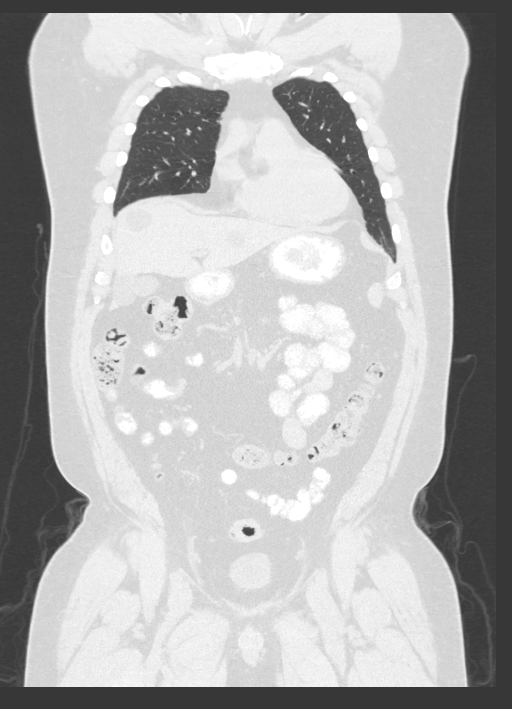
[im 97/161  lung]
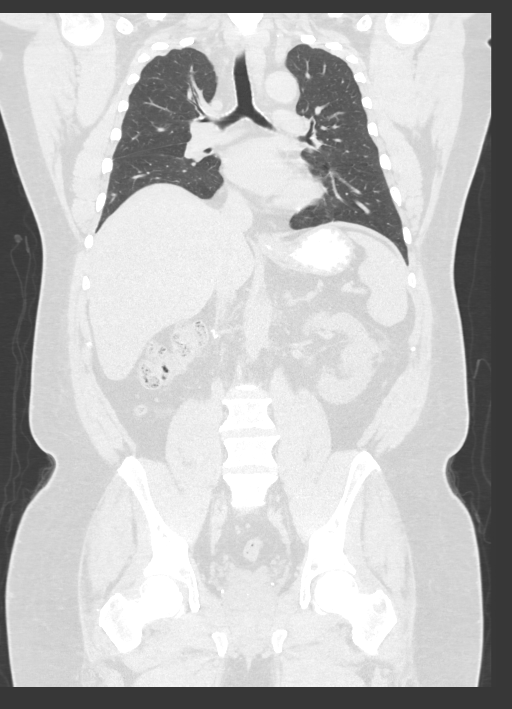

[13 of 36 positions shown; findings below may reference images not displayed]

FINDINGS: CT CHEST FINDINGS

Cardiovascular: Aortic atherosclerosis. Normal heart size. No
pericardial effusion.

Mediastinum/Nodes: No enlarged mediastinal, hilar, or axillary lymph
nodes. Thyroid gland, trachea, and esophagus demonstrate no
significant findings.

Lungs/Pleura: Multiple small bilateral pulmonary nodules are stable
or slightly diminished compared to prior examination, for example a
subpleural nodule of the dependent right lower lobe measures no
greater than 0.2 cm, previously 0.4 cm (series 4, image 79) and
small adjacent nodules of the left lung base measure no greater than
0.2 cm, previously 0.3 cm (series 4, image 95). Other small nodules
are not significantly changed, for example a 0.5 cm nodule of the
right lower lobe (series 4, image 79) and a 0.4 cm nodule of the
anterior left upper lobe (series 4, image 30). No pleural effusion
or pneumothorax.

Musculoskeletal: No chest wall mass or suspicious bone lesions
identified.

CT ABDOMEN PELVIS FINDINGS

Hepatobiliary: No solid liver abnormality is seen. Multiple fluid
attenuation cystic lesions of the liver are unchanged. No
gallstones, gallbladder wall thickening, or biliary dilatation.

Pancreas: Unremarkable. No pancreatic ductal dilatation or
surrounding inflammatory changes.

Spleen: Normal in size without significant abnormality.

Adrenals/Urinary Tract: Adrenal glands are unremarkable. Status post
right nephrectomy. No noncontrast evidence of recurrent soft tissue
in the right nephrectomy bed. Interval decrease in size of a lesion
of the midportion of the left kidney, measuring 3.8 x 2.8 cm,
previously 5.5 x 3.2 cm when measured similarly (series 2, image
68). Generally unchanged adjacent fat stranding and necrosis. Mild
thickening of the urinary bladder wall.

Stomach/Bowel: Stomach is within normal limits. Appendix appears
normal. No evidence of bowel wall thickening, distention, or
inflammatory changes. Sigmoid diverticula.

Vascular/Lymphatic: No significant vascular findings are present. No
enlarged abdominal or pelvic lymph nodes.

Reproductive: Mild prostatomegaly.

Other: No abdominal wall hernia or abnormality. No abdominopelvic
ascites.

Musculoskeletal: No acute or significant osseous findings.
IMPRESSION: 1. Multiple small bilateral pulmonary nodules are stable or slightly
diminished compared to prior examination, consistent with slightly
improved pulmonary metastatic disease.
2. Status post right nephrectomy. No noncontrast evidence of
recurrent soft tissue in the right nephrectomy bed.
3. Interval decrease in size of a lesion of the midportion of the
left kidney with adjacent fat stranding and necrosis, consistent
with expected evolution following cryoablation.
4. No noncontrast CT evidence of lymphadenopathy or metastatic
disease in the abdomen or pelvis.

Aortic Atherosclerosis ([U9]-[U9]).

## 2021-10-18 ENCOUNTER — Other Ambulatory Visit: Payer: Self-pay | Admitting: Oncology

## 2021-10-19 ENCOUNTER — Inpatient Hospital Stay: Payer: BC Managed Care – PPO | Attending: Oncology

## 2021-10-19 ENCOUNTER — Other Ambulatory Visit: Payer: Self-pay

## 2021-10-19 ENCOUNTER — Telehealth: Payer: Self-pay | Admitting: *Deleted

## 2021-10-19 ENCOUNTER — Other Ambulatory Visit: Payer: Self-pay | Admitting: Oncology

## 2021-10-19 ENCOUNTER — Inpatient Hospital Stay (HOSPITAL_BASED_OUTPATIENT_CLINIC_OR_DEPARTMENT_OTHER): Payer: BC Managed Care – PPO | Admitting: Oncology

## 2021-10-19 ENCOUNTER — Inpatient Hospital Stay: Payer: BC Managed Care – PPO

## 2021-10-19 VITALS — BP 123/85 | HR 66 | Temp 97.9°F | Resp 20 | Ht 66.0 in | Wt 255.8 lb

## 2021-10-19 DIAGNOSIS — R7989 Other specified abnormal findings of blood chemistry: Secondary | ICD-10-CM | POA: Diagnosis not present

## 2021-10-19 DIAGNOSIS — I700 Atherosclerosis of aorta: Secondary | ICD-10-CM | POA: Insufficient documentation

## 2021-10-19 DIAGNOSIS — C649 Malignant neoplasm of unspecified kidney, except renal pelvis: Secondary | ICD-10-CM | POA: Diagnosis not present

## 2021-10-19 DIAGNOSIS — Z5112 Encounter for antineoplastic immunotherapy: Secondary | ICD-10-CM | POA: Diagnosis not present

## 2021-10-19 DIAGNOSIS — K754 Autoimmune hepatitis: Secondary | ICD-10-CM | POA: Diagnosis not present

## 2021-10-19 DIAGNOSIS — C7800 Secondary malignant neoplasm of unspecified lung: Secondary | ICD-10-CM | POA: Insufficient documentation

## 2021-10-19 DIAGNOSIS — R682 Dry mouth, unspecified: Secondary | ICD-10-CM | POA: Diagnosis not present

## 2021-10-19 DIAGNOSIS — Z880 Allergy status to penicillin: Secondary | ICD-10-CM | POA: Insufficient documentation

## 2021-10-19 DIAGNOSIS — C641 Malignant neoplasm of right kidney, except renal pelvis: Secondary | ICD-10-CM | POA: Insufficient documentation

## 2021-10-19 DIAGNOSIS — E039 Hypothyroidism, unspecified: Secondary | ICD-10-CM | POA: Diagnosis not present

## 2021-10-19 DIAGNOSIS — Z79899 Other long term (current) drug therapy: Secondary | ICD-10-CM | POA: Diagnosis not present

## 2021-10-19 DIAGNOSIS — Z905 Acquired absence of kidney: Secondary | ICD-10-CM | POA: Insufficient documentation

## 2021-10-19 LAB — CMP (CANCER CENTER ONLY)
ALT: 29 U/L (ref 0–44)
AST: 31 U/L (ref 15–41)
Albumin: 4.5 g/dL (ref 3.5–5.0)
Alkaline Phosphatase: 108 U/L (ref 38–126)
Anion gap: 11 (ref 5–15)
BUN: 13 mg/dL (ref 6–20)
CO2: 24 mmol/L (ref 22–32)
Calcium: 9.1 mg/dL (ref 8.9–10.3)
Chloride: 104 mmol/L (ref 98–111)
Creatinine: 1.43 mg/dL — ABNORMAL HIGH (ref 0.61–1.24)
GFR, Estimated: 59 mL/min — ABNORMAL LOW (ref >60)
Glucose, Bld: 102 mg/dL — ABNORMAL HIGH (ref 70–99)
Potassium: 3.9 mmol/L (ref 3.5–5.1)
Sodium: 139 mmol/L (ref 135–145)
Total Bilirubin: 0.8 mg/dL (ref 0.3–1.2)
Total Protein: 8.3 g/dL — ABNORMAL HIGH (ref 6.5–8.1)

## 2021-10-19 LAB — CBC WITH DIFFERENTIAL (CANCER CENTER ONLY)
Abs Immature Granulocytes: 0.02 10*3/uL (ref 0.00–0.07)
Basophils Absolute: 0 10*3/uL (ref 0.0–0.1)
Basophils Relative: 1 %
Eosinophils Absolute: 0.2 10*3/uL (ref 0.0–0.5)
Eosinophils Relative: 3 %
HCT: 46 % (ref 39.0–52.0)
Hemoglobin: 15.9 g/dL (ref 13.0–17.0)
Immature Granulocytes: 0 %
Lymphocytes Relative: 31 %
Lymphs Abs: 2.1 10*3/uL (ref 0.7–4.0)
MCH: 29.6 pg (ref 26.0–34.0)
MCHC: 34.6 g/dL (ref 30.0–36.0)
MCV: 85.5 fL (ref 80.0–100.0)
Monocytes Absolute: 0.5 10*3/uL (ref 0.1–1.0)
Monocytes Relative: 8 %
Neutro Abs: 3.9 10*3/uL (ref 1.7–7.7)
Neutrophils Relative %: 57 %
Platelet Count: 270 10*3/uL (ref 150–400)
RBC: 5.38 MIL/uL (ref 4.22–5.81)
RDW: 13 % (ref 11.5–15.5)
WBC Count: 6.8 10*3/uL (ref 4.0–10.5)
nRBC: 0 % (ref 0.0–0.2)

## 2021-10-19 LAB — TSH: TSH: 24.766 u[IU]/mL — ABNORMAL HIGH (ref 0.320–4.118)

## 2021-10-19 MED ORDER — SODIUM CHLORIDE 0.9 % IV SOLN: Freq: Once | INTRAVENOUS | Status: AC

## 2021-10-19 MED ORDER — LEVOTHYROXINE SODIUM 25 MCG PO TABS: 25.0000 ug | ORAL_TABLET | Freq: Every day | ORAL | 1 refills | Status: DC

## 2021-10-19 MED ORDER — SODIUM CHLORIDE 0.9 % IV SOLN
200.0000 mg | Freq: Once | INTRAVENOUS | Status: AC
  Administered 2021-10-19: 200 mg via INTRAVENOUS
  Filled 2021-10-19: qty 8

## 2021-10-19 NOTE — Progress Notes (Signed)
Hematology and Oncology Follow Up Visit  Michael [name] [id number] [birth year] 55 y.o. 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  Secondary diagnosis: Left kidney tumors noted in May 2022.  He was found to have 2.8 x 2.8 x 2.7 mm lesion measuring 2.1 x 2.0 x 1.6.   Prior Therapy: He underwent a right nephrectomy on August 27, 2019 performed while he was living in [place].  The final pathology showed clear cell renal cell carcinoma, nuclear grade 3 with 10% necrosis negative margins.  Final pathological stage was T2ANX.  He is status post Pembrolizumab and axitinib started in March 2021.  He received two cycles of therapy.  Therapy was interrupted because of increased LFTs and autoimmune hepatitis.    He has been receiving his therapy in [place] and relocated to this area.  Axitinib 5 mg daily started on July 05, 2020.  Therapy discontinued on July 13, 2020 because of elevated LFTs.  He is status post cryoablation of a left renal mass completed on May 30, 2021.  Current therapy: Pembrolizumab 200 mg every 3 weeks started on July 27, 2021.  He is here for cycle 5 of therapy.  Interim History: [name] returns today for a follow-up evaluation.  Since the last visit, he reports no major changes in his health.  He continues to tolerate Pembrolizumab without any major complaints.  He does report dry mouth but no other complaints.  He denies any rash or pruritus.  He denies any shortness of breath, difficulty breathing or changes in bowel habits.  His performance status and quality of life remains unchanged.    Medications: Reviewed without changes. Current Outpatient Medications  Medication Sig Dispense Refill   amLODipine (NORVASC) 5 MG tablet Take 7.5 mg by mouth in the morning.      cholecalciferol (VITAMIN D3) 25 MCG (1000 UNIT) tablet Take 1,000 Units by mouth in the morning.     levothyroxine (SYNTHROID) 100 MCG tablet TAKE 1 TABLET(100 MCG) BY MOUTH DAILY BEFORE AND BREAKFAST 90 tablet 0   montelukast (SINGULAIR) 10 MG tablet Take 10 mg by mouth in the morning.     tadalafil (CIALIS) 20 MG tablet Take 10-20 mg by mouth daily as needed for erectile dysfunction.     No current facility-administered medications for this visit.     Allergies:  Allergies  Allergen Reactions   Penicillins Other (See Comments)    Childhood allergies- unk      Physical Exam:      Blood pressure 123/85, pulse 66, temperature 97.9 F (36.6 C), temperature source Tympanic, resp. rate 20, height 5\' 6"  (1.676 m), weight 255 lb 12.8 oz (116 kg), SpO2 98 %.      ECOG: 0    General appearance: Alert, awake without any distress. Head: Atraumatic without abnormalities Oropharynx: Without any thrush or ulcers. Eyes: No scleral icterus. Lymph nodes: No lymphadenopathy noted in the cervical, supraclavicular, or axillary nodes Heart:regular rate and rhythm, without any murmurs or gallops.   Lung: Clear to auscultation without any rhonchi, wheezes or dullness to percussion. Abdomin: Soft, nontender without any shifting dullness or ascites. Musculoskeletal: No clubbing or cyanosis. Neurological: No motor or sensory deficits. Skin: No rashes or lesions.                 Lab Results: Lab Results  Component Value  Date   WBC 5.5 09/28/2021   HGB 14.3 09/28/2021   HCT 40.9 09/28/2021   MCV 85.7 09/28/2021   PLT 257 09/28/2021     Chemistry      Component Value Date/Time   NA 140 09/28/2021 1001   K 3.9 09/28/2021 1001   CL 108 09/28/2021 1001   CO2 22 09/28/2021 1001   BUN 18 09/28/2021 1001   CREATININE 1.37 (H) 09/28/2021 1001      Component Value Date/Time   CALCIUM 8.5 (L) 09/28/2021 1001   ALKPHOS 97 09/28/2021 1001   AST 30 09/28/2021 1001   ALT  27 09/28/2021 1001   BILITOT 0.7 09/28/2021 1001        IMPRESSION: 1. Multiple small bilateral pulmonary nodules are stable or slightly diminished compared to prior examination, consistent with slightly improved pulmonary metastatic disease. 2. Status post right nephrectomy. No noncontrast evidence of recurrent soft tissue in the right nephrectomy bed. 3. Interval decrease in size of a lesion of the midportion of the left kidney with adjacent fat stranding and necrosis, consistent with expected evolution following cryoablation. 4. No noncontrast CT evidence of lymphadenopathy or metastatic disease in the abdomen or pelvis.   Aortic Atherosclerosis (ICD10-I70.0).     Impression and Plan:  55 year old with:   1.  Stage IV clear-cell renal cell carcinoma arising from the right kidney diagnosed in 2020.    CT scan obtained on October 15, 2021 was personally reviewed and continues to show a positive response to therapy with decrease in his bilateral pulmonary nodules and no additional lymphadenopathy or disease progression.  Risks and benefits of continuing Pembrolizumab were discussed at this time.  Potential complications that include immune mediated issues, GI toxicity among others were reviewed.  He is agreeable to continue and will consider switching to every 6-week schedule in the future.   2.  Hypothyroidism: His TSH is increasing again and will adjust thyroid medication.  We will await laboratory testing from today and we have discussed risks and benefits of increasing the Synthroid dose to 125 mg.   3.    Left kidney masses: Imaging studies showed improvement of his lesion in the left kidney after cryoablation.  We will continue to follow with Dr. Kathlene [name] regarding future ablation.   4.  Goals of care and prognosis: Therapy remains aggressive at this time although curative options do not exist.   5.  Elevated AST and ALT: Resolved currently with normal hepatic function.   This is related to Inlyta rather than Pembrolizumab.   6.  Follow-up: In 3 weeks for the next cycle of therapy.   30  minutes were dedicated to this visit.  The time was spent on reviewing laboratory data, disease status update and outlining future plan of care.    Zola Button, MD 12/16/202211:49 AM    0 change since

## 2021-10-19 NOTE — Telephone Encounter (Signed)
Called pt with information per Dr Alen Blew regarding increasing his thyroid med.  Pt expressed understanding & will p/u additional dosage & has f/u in 3 wks.

## 2021-10-19 NOTE — Telephone Encounter (Signed)
-----   Message from Wyatt Portela, MD sent at 10/19/2021  1:43 PM EST ----- Please let him know to increase his thyroid medication to 125 mcg.  He has 100 mcg tablets and I ordered 25 for him to add to it.

## 2021-10-19 NOTE — Patient Instructions (Signed)
[facility]  Discharge Instructions: ?Thank you for choosing [facility] to provide your oncology and hematology care.  ? ?If you have a lab appointment with the Cancer Center, please go directly to the Cancer Center and check in at the registration area. ?  ?Wear comfortable clothing and clothing appropriate for easy access to any Portacath or PICC line.  ? ?We strive to give you quality time with your provider. You may need to reschedule your appointment if you arrive late (15 or more minutes).  Arriving late affects you and other patients whose appointments are after yours.  Also, if you miss three or more appointments without notifying the office, you may be dismissed from the clinic at the provider?s discretion.    ?  ?For prescription refill requests, have your pharmacy contact our office and allow 72 hours for refills to be completed.   ? ?Today you received the following chemotherapy and/or immunotherapy agents: Keytruda ?  ?To help prevent nausea and vomiting after your treatment, we encourage you to take your nausea medication as directed. ? ?BELOW ARE SYMPTOMS THAT SHOULD BE REPORTED IMMEDIATELY: ?*FEVER GREATER THAN 100.4 F (38 ?C) OR HIGHER ?*CHILLS OR SWEATING ?*NAUSEA AND VOMITING THAT IS NOT CONTROLLED WITH YOUR NAUSEA MEDICATION ?*UNUSUAL SHORTNESS OF BREATH ?*UNUSUAL BRUISING OR BLEEDING ?*URINARY PROBLEMS (pain or burning when urinating, or frequent urination) ?*BOWEL PROBLEMS (unusual diarrhea, constipation, pain near the anus) ?TENDERNESS IN MOUTH AND THROAT WITH OR WITHOUT PRESENCE OF ULCERS (sore throat, sores in mouth, or a toothache) ?UNUSUAL RASH, SWELLING OR PAIN  ?UNUSUAL VAGINAL DISCHARGE OR ITCHING  ? ?Items with * indicate a potential emergency and should be followed up as soon as possible or go to the Emergency Department if any problems should occur. ? ?Please show the CHEMOTHERAPY ALERT CARD or IMMUNOTHERAPY ALERT CARD at check-in to the  Emergency Department and triage nurse. ? ?Should you have questions after your visit or need to cancel or reschedule your appointment, please contact [facility]  Dept: [phone]  and follow the prompts.  Office hours are 8:00 a.m. to 4:30 p.m. Monday - Friday. Please note that voicemails left after 4:00 p.m. may not be returned until the following business day.  We are closed weekends and major holidays. You have access to a nurse at all times for urgent questions. Please call the main number to the clinic Dept: [phone] and follow the prompts. ? ? ?For any non-urgent questions, you may also contact your provider using MyChart. We now offer e-Visits for anyone 18 and older to request care online for non-urgent symptoms. For details visit [email or web address]. ?  ?Also download the MyChart app! Go to the app store, search "MyChart", open the app, select [facility], and log in with your MyChart username and password. ? ?Due to Covid, a mask is required upon entering the hospital/clinic. If you do not have a mask, one will be given to you upon arrival. For doctor visits, patients may have 1 support person aged 18 or older with them. For treatment visits, patients cannot have anyone with them due to current Covid guidelines and our immunocompromised population.  ? ?

## 2021-10-21 ENCOUNTER — Other Ambulatory Visit: Payer: Self-pay

## 2021-11-09 ENCOUNTER — Inpatient Hospital Stay (HOSPITAL_BASED_OUTPATIENT_CLINIC_OR_DEPARTMENT_OTHER): Payer: BC Managed Care – PPO | Admitting: Oncology

## 2021-11-09 ENCOUNTER — Inpatient Hospital Stay: Payer: BC Managed Care – PPO

## 2021-11-09 ENCOUNTER — Inpatient Hospital Stay: Payer: BC Managed Care – PPO | Attending: Oncology

## 2021-11-09 VITALS — BP 118/83 | HR 64 | Temp 97.7°F | Resp 19 | Ht 66.0 in | Wt 255.3 lb

## 2021-11-09 DIAGNOSIS — Z79899 Other long term (current) drug therapy: Secondary | ICD-10-CM | POA: Insufficient documentation

## 2021-11-09 DIAGNOSIS — E039 Hypothyroidism, unspecified: Secondary | ICD-10-CM | POA: Insufficient documentation

## 2021-11-09 DIAGNOSIS — R682 Dry mouth, unspecified: Secondary | ICD-10-CM | POA: Diagnosis not present

## 2021-11-09 DIAGNOSIS — C7800 Secondary malignant neoplasm of unspecified lung: Secondary | ICD-10-CM | POA: Diagnosis not present

## 2021-11-09 DIAGNOSIS — C649 Malignant neoplasm of unspecified kidney, except renal pelvis: Secondary | ICD-10-CM

## 2021-11-09 DIAGNOSIS — K754 Autoimmune hepatitis: Secondary | ICD-10-CM | POA: Insufficient documentation

## 2021-11-09 DIAGNOSIS — C641 Malignant neoplasm of right kidney, except renal pelvis: Secondary | ICD-10-CM | POA: Insufficient documentation

## 2021-11-09 DIAGNOSIS — Z5112 Encounter for antineoplastic immunotherapy: Secondary | ICD-10-CM | POA: Diagnosis not present

## 2021-11-09 DIAGNOSIS — Z880 Allergy status to penicillin: Secondary | ICD-10-CM | POA: Diagnosis not present

## 2021-11-09 DIAGNOSIS — Z79890 Hormone replacement therapy (postmenopausal): Secondary | ICD-10-CM | POA: Insufficient documentation

## 2021-11-09 DIAGNOSIS — Z905 Acquired absence of kidney: Secondary | ICD-10-CM | POA: Diagnosis not present

## 2021-11-09 DIAGNOSIS — R7989 Other specified abnormal findings of blood chemistry: Secondary | ICD-10-CM | POA: Diagnosis not present

## 2021-11-09 LAB — CBC WITH DIFFERENTIAL (CANCER CENTER ONLY)
Abs Immature Granulocytes: 0.01 10*3/uL (ref 0.00–0.07)
Basophils Absolute: 0.1 10*3/uL (ref 0.0–0.1)
Basophils Relative: 1 %
Eosinophils Absolute: 0.4 10*3/uL (ref 0.0–0.5)
Eosinophils Relative: 6 %
HCT: 43.5 % (ref 39.0–52.0)
Hemoglobin: 14.9 g/dL (ref 13.0–17.0)
Immature Granulocytes: 0 %
Lymphocytes Relative: 28 %
Lymphs Abs: 1.7 10*3/uL (ref 0.7–4.0)
MCH: 29.8 pg (ref 26.0–34.0)
MCHC: 34.3 g/dL (ref 30.0–36.0)
MCV: 87 fL (ref 80.0–100.0)
Monocytes Absolute: 0.5 10*3/uL (ref 0.1–1.0)
Monocytes Relative: 9 %
Neutro Abs: 3.5 10*3/uL (ref 1.7–7.7)
Neutrophils Relative %: 56 %
Platelet Count: 242 10*3/uL (ref 150–400)
RBC: 5 MIL/uL (ref 4.22–5.81)
RDW: 13.2 % (ref 11.5–15.5)
WBC Count: 6.2 10*3/uL (ref 4.0–10.5)
nRBC: 0 % (ref 0.0–0.2)

## 2021-11-09 LAB — TSH: TSH: 2.812 u[IU]/mL (ref 0.320–4.118)

## 2021-11-09 LAB — CMP (CANCER CENTER ONLY)
ALT: 26 U/L (ref 0–44)
AST: 27 U/L (ref 15–41)
Albumin: 4.3 g/dL (ref 3.5–5.0)
Alkaline Phosphatase: 90 U/L (ref 38–126)
Anion gap: 9 (ref 5–15)
BUN: 18 mg/dL (ref 6–20)
CO2: 24 mmol/L (ref 22–32)
Calcium: 9.2 mg/dL (ref 8.9–10.3)
Chloride: 106 mmol/L (ref 98–111)
Creatinine: 1.21 mg/dL (ref 0.61–1.24)
GFR, Estimated: >60 mL/min (ref >60)
Glucose, Bld: 93 mg/dL (ref 70–99)
Potassium: 3.9 mmol/L (ref 3.5–5.1)
Sodium: 139 mmol/L (ref 135–145)
Total Bilirubin: 0.6 mg/dL (ref 0.3–1.2)
Total Protein: 7.6 g/dL (ref 6.5–8.1)

## 2021-11-09 MED ORDER — SODIUM CHLORIDE 0.9 % IV SOLN: Freq: Once | INTRAVENOUS | Status: AC

## 2021-11-09 MED ORDER — SODIUM CHLORIDE 0.9 % IV SOLN
400.0000 mg | Freq: Once | INTRAVENOUS | Status: AC
  Administered 2021-11-09: 400 mg via INTRAVENOUS
  Filled 2021-11-09: qty 16

## 2021-11-09 NOTE — Patient Instructions (Signed)
[facility]  Discharge Instructions: ?Thank you for choosing [facility] to provide your oncology and hematology care.  ? ?If you have a lab appointment with the Cancer Center, please go directly to the Cancer Center and check in at the registration area. ?  ?Wear comfortable clothing and clothing appropriate for easy access to any Portacath or PICC line.  ? ?We strive to give you quality time with your provider. You may need to reschedule your appointment if you arrive late (15 or more minutes).  Arriving late affects you and other patients whose appointments are after yours.  Also, if you miss three or more appointments without notifying the office, you may be dismissed from the clinic at the provider?s discretion.    ?  ?For prescription refill requests, have your pharmacy contact our office and allow 72 hours for refills to be completed.   ? ?Today you received the following chemotherapy and/or immunotherapy agents: Keytruda ?  ?To help prevent nausea and vomiting after your treatment, we encourage you to take your nausea medication as directed. ? ?BELOW ARE SYMPTOMS THAT SHOULD BE REPORTED IMMEDIATELY: ?*FEVER GREATER THAN 100.4 F (38 ?C) OR HIGHER ?*CHILLS OR SWEATING ?*NAUSEA AND VOMITING THAT IS NOT CONTROLLED WITH YOUR NAUSEA MEDICATION ?*UNUSUAL SHORTNESS OF BREATH ?*UNUSUAL BRUISING OR BLEEDING ?*URINARY PROBLEMS (pain or burning when urinating, or frequent urination) ?*BOWEL PROBLEMS (unusual diarrhea, constipation, pain near the anus) ?TENDERNESS IN MOUTH AND THROAT WITH OR WITHOUT PRESENCE OF ULCERS (sore throat, sores in mouth, or a toothache) ?UNUSUAL RASH, SWELLING OR PAIN  ?UNUSUAL VAGINAL DISCHARGE OR ITCHING  ? ?Items with * indicate a potential emergency and should be followed up as soon as possible or go to the Emergency Department if any problems should occur. ? ?Please show the CHEMOTHERAPY ALERT CARD or IMMUNOTHERAPY ALERT CARD at check-in to the  Emergency Department and triage nurse. ? ?Should you have questions after your visit or need to cancel or reschedule your appointment, please contact [facility]  Dept: [phone]  and follow the prompts.  Office hours are 8:00 a.m. to 4:30 p.m. Monday - Friday. Please note that voicemails left after 4:00 p.m. may not be returned until the following business day.  We are closed weekends and major holidays. You have access to a nurse at all times for urgent questions. Please call the main number to the clinic Dept: [phone] and follow the prompts. ? ? ?For any non-urgent questions, you may also contact your provider using MyChart. We now offer e-Visits for anyone 18 and older to request care online for non-urgent symptoms. For details visit [email or web address]. ?  ?Also download the MyChart app! Go to the app store, search "MyChart", open the app, select [facility], and log in with your MyChart username and password. ? ?Due to Covid, a mask is required upon entering the hospital/clinic. If you do not have a mask, one will be given to you upon arrival. For doctor visits, patients may have 1 support person aged 18 or older with them. For treatment visits, patients cannot have anyone with them due to current Covid guidelines and our immunocompromised population.  ? ?

## 2021-11-09 NOTE — Progress Notes (Signed)
Hematology and Oncology Follow Up Visit  Michael [name] [id number] April 29, 1966 56 y.o. 11/09/2021 11:56 AM Michael [name], MDRusso, Michael Reichmann, MD   Principle Diagnosis: 56 year old man with right kidney cancer diagnosed in 2020.  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  Secondary diagnosis: Left kidney tumors noted in May 2022.  He was found to have 2.8 x 2.8 x 2.7 mm lesion measuring 2.1 x 2.0 x 1.6.   Prior Therapy: He underwent a right nephrectomy on August 27, 2019 performed while he was living in [place].  The final pathology showed clear cell renal cell carcinoma, nuclear grade 3 with 10% necrosis negative margins.  Final pathological stage was T2ANX.  He is status post Pembrolizumab and axitinib started in March 2021.  He received two cycles of therapy.  Therapy was interrupted because of increased LFTs and autoimmune hepatitis.    He has been receiving his therapy in [place] and relocated to this area.  Axitinib 5 mg daily started on July 05, 2020.  Therapy discontinued on July 13, 2020 because of elevated LFTs.  He is status post cryoablation of a left renal mass completed on May 30, 2021.  Current therapy: Pembrolizumab 200 mg every 3 weeks started on July 27, 2021.  He is here for cycle 6 of therapy.  Interim History: Michael [name] is here for a follow-up visit.  Since the last visit, he reports no major changes in his health.  He tolerated the last Pembrolizumab without any major complaints.  He has reported dry mouth which has not changed dramatically at this time.  He denies any recent hospitalizations or illnesses.  He denies any shortness of breath or difficulty breathing.    Medications: Updated on review. Current Outpatient Medications  Medication Sig Dispense Refill   amLODipine (NORVASC) 5 MG tablet Take 7.5 mg by mouth in the morning.     cholecalciferol (VITAMIN D3) 25 MCG (1000 UNIT) tablet Take 1,000 Units  by mouth in the morning.     levothyroxine (SYNTHROID) 100 MCG tablet TAKE 1 TABLET(100 MCG) BY MOUTH DAILY BEFORE AND BREAKFAST 90 tablet 0   levothyroxine (SYNTHROID) 25 MCG tablet TAKE 1 TABLET(25 MCG) BY MOUTH DAILY BEFORE BREAKFAST 90 tablet 1   montelukast (SINGULAIR) 10 MG tablet Take 10 mg by mouth in the morning.     tadalafil (CIALIS) 20 MG tablet Take 10-20 mg by mouth daily as needed for erectile dysfunction.     No current facility-administered medications for this visit.     Allergies:  Allergies  Allergen Reactions   Penicillins Other (See Comments)    Childhood allergies- unk      Physical Exam:       Blood pressure 118/83, pulse 64, temperature 97.7 F (36.5 C), temperature source Temporal, resp. rate 19, height 5\' 6"  (1.676 m), weight 255 lb 4.8 oz (115.8 kg), SpO2 99 %.      ECOG: 0   General appearance: Comfortable appearing without any discomfort Head: Normocephalic without any trauma Oropharynx: Mucous membranes are moist and pink without any thrush or ulcers. Eyes: Pupils are equal and round reactive to light. Lymph nodes: No cervical, supraclavicular, inguinal or axillary lymphadenopathy.   Heart:regular rate and rhythm.  S1 and S2 without leg edema. Lung: Clear without any rhonchi or wheezes.  No dullness to percussion. Abdomin: Soft, nontender, nondistended with good bowel sounds.  No hepatosplenomegaly. Musculoskeletal: No joint deformity or effusion.  Full range of motion noted. Neurological: No deficits noted on  motor, sensory and deep tendon reflex exam. Skin: No petechial rash or dryness.  Appeared moist.                  Lab Results: Lab Results  Component Value Date   WBC 6.8 10/19/2021   HGB 15.9 10/19/2021   HCT 46.0 10/19/2021   MCV 85.5 10/19/2021   PLT 270 10/19/2021     Chemistry      Component Value Date/Time   NA 139 10/19/2021 1149   K 3.9 10/19/2021 1149   CL 104 10/19/2021 1149   CO2 24  10/19/2021 1149   BUN 13 10/19/2021 1149   CREATININE 1.43 (H) 10/19/2021 1149      Component Value Date/Time   CALCIUM 9.1 10/19/2021 1149   ALKPHOS 108 10/19/2021 1149   AST 31 10/19/2021 1149   ALT 29 10/19/2021 1149   BILITOT 0.8 10/19/2021 1149            Impression and Plan:  56 year old with:   1.  Kidney cancer diagnosed in 2020.  He developed stage IV clear-cell subtype with pulmonary involvement.  He continues to be on Pembrolizumab without any major complaints.  Risks and benefits of continuing this treatment were discussed.  Adding additional therapy such as lenvatinib could be added if he has progression of disease in the future.  He is agreeable to proceed.  We will change his dose to 400 mg every 6 weeks starting today.   2.  Hypothyroidism: He is currently on Synthroid and will continue to monitor.   3.    Left kidney masses: Status post ablation and I will have repeat imaging studies in the future.   4.  Goals of care and prognosis: Aggressive measures are warranted at this time given his age and excellent performance status.   5.  Dry mouth: Likely autoimmune complication.  We have discussed strategies to improve his oral saliva production including lozenges and oral hygiene.   6.  Follow-up: He will return in 6 weeks for next evaluation.   30  minutes were spent on this encounter.  The time was dedicated to reviewing laboratory data, disease status update and outlining future plan of care.    Michael Button, MD 1/6/202311:56 AM    0 change since

## 2021-11-20 DIAGNOSIS — I129 Hypertensive chronic kidney disease with stage 1 through stage 4 chronic kidney disease, or unspecified chronic kidney disease: Secondary | ICD-10-CM | POA: Diagnosis not present

## 2021-11-20 DIAGNOSIS — C641 Malignant neoplasm of right kidney, except renal pelvis: Secondary | ICD-10-CM | POA: Diagnosis not present

## 2021-12-05 ENCOUNTER — Telehealth: Payer: Self-pay | Admitting: Oncology

## 2021-12-05 NOTE — Telephone Encounter (Signed)
Scheduled per 01/06 los, patient has been called and voicemail was left regarding upcoming appointment.

## 2021-12-14 ENCOUNTER — Telehealth: Payer: Self-pay | Admitting: Oncology

## 2021-12-14 NOTE — Telephone Encounter (Signed)
Called patient regarding upcoming appointment, patient has been called and notified. ?

## 2021-12-18 ENCOUNTER — Inpatient Hospital Stay (HOSPITAL_BASED_OUTPATIENT_CLINIC_OR_DEPARTMENT_OTHER): Payer: BC Managed Care – PPO | Admitting: Oncology

## 2021-12-18 ENCOUNTER — Inpatient Hospital Stay: Payer: BC Managed Care – PPO

## 2021-12-18 ENCOUNTER — Inpatient Hospital Stay: Payer: BC Managed Care – PPO | Attending: Oncology

## 2021-12-18 ENCOUNTER — Other Ambulatory Visit: Payer: Self-pay

## 2021-12-18 VITALS — BP 122/86 | HR 60 | Temp 97.9°F | Resp 17 | Ht 66.0 in | Wt 248.0 lb

## 2021-12-18 DIAGNOSIS — C649 Malignant neoplasm of unspecified kidney, except renal pelvis: Secondary | ICD-10-CM

## 2021-12-18 DIAGNOSIS — C641 Malignant neoplasm of right kidney, except renal pelvis: Secondary | ICD-10-CM | POA: Insufficient documentation

## 2021-12-18 DIAGNOSIS — R682 Dry mouth, unspecified: Secondary | ICD-10-CM | POA: Insufficient documentation

## 2021-12-18 DIAGNOSIS — N2889 Other specified disorders of kidney and ureter: Secondary | ICD-10-CM | POA: Diagnosis not present

## 2021-12-18 DIAGNOSIS — E039 Hypothyroidism, unspecified: Secondary | ICD-10-CM | POA: Insufficient documentation

## 2021-12-18 DIAGNOSIS — R7989 Other specified abnormal findings of blood chemistry: Secondary | ICD-10-CM | POA: Insufficient documentation

## 2021-12-18 DIAGNOSIS — C7800 Secondary malignant neoplasm of unspecified lung: Secondary | ICD-10-CM | POA: Diagnosis not present

## 2021-12-18 DIAGNOSIS — Z5112 Encounter for antineoplastic immunotherapy: Secondary | ICD-10-CM | POA: Insufficient documentation

## 2021-12-18 DIAGNOSIS — Z79899 Other long term (current) drug therapy: Secondary | ICD-10-CM | POA: Insufficient documentation

## 2021-12-18 LAB — CBC WITH DIFFERENTIAL (CANCER CENTER ONLY)
Abs Immature Granulocytes: 0.02 10*3/uL (ref 0.00–0.07)
Basophils Absolute: 0.1 10*3/uL (ref 0.0–0.1)
Basophils Relative: 1 %
Eosinophils Absolute: 0.3 10*3/uL (ref 0.0–0.5)
Eosinophils Relative: 5 %
HCT: 45 % (ref 39.0–52.0)
Hemoglobin: 15.9 g/dL (ref 13.0–17.0)
Immature Granulocytes: 0 %
Lymphocytes Relative: 29 %
Lymphs Abs: 1.9 10*3/uL (ref 0.7–4.0)
MCH: 29.9 pg (ref 26.0–34.0)
MCHC: 35.3 g/dL (ref 30.0–36.0)
MCV: 84.7 fL (ref 80.0–100.0)
Monocytes Absolute: 0.5 10*3/uL (ref 0.1–1.0)
Monocytes Relative: 7 %
Neutro Abs: 4 10*3/uL (ref 1.7–7.7)
Neutrophils Relative %: 58 %
Platelet Count: 258 10*3/uL (ref 150–400)
RBC: 5.31 MIL/uL (ref 4.22–5.81)
RDW: 12.8 % (ref 11.5–15.5)
WBC Count: 6.7 10*3/uL (ref 4.0–10.5)
nRBC: 0 % (ref 0.0–0.2)

## 2021-12-18 LAB — CMP (CANCER CENTER ONLY)
ALT: 23 U/L (ref 0–44)
AST: 27 U/L (ref 15–41)
Albumin: 4.4 g/dL (ref 3.5–5.0)
Alkaline Phosphatase: 93 U/L (ref 38–126)
Anion gap: 8 (ref 5–15)
BUN: 16 mg/dL (ref 6–20)
CO2: 25 mmol/L (ref 22–32)
Calcium: 9.2 mg/dL (ref 8.9–10.3)
Chloride: 105 mmol/L (ref 98–111)
Creatinine: 1.25 mg/dL — ABNORMAL HIGH (ref 0.61–1.24)
GFR, Estimated: >60 mL/min (ref >60)
Glucose, Bld: 93 mg/dL (ref 70–99)
Potassium: 3.9 mmol/L (ref 3.5–5.1)
Sodium: 138 mmol/L (ref 135–145)
Total Bilirubin: 0.6 mg/dL (ref 0.3–1.2)
Total Protein: 7.7 g/dL (ref 6.5–8.1)

## 2021-12-18 LAB — TSH: TSH: 5.6 u[IU]/mL — ABNORMAL HIGH (ref 0.320–4.118)

## 2021-12-18 MED ORDER — SODIUM CHLORIDE 0.9 % IV SOLN: Freq: Once | INTRAVENOUS | Status: AC

## 2021-12-18 MED ORDER — SODIUM CHLORIDE 0.9 % IV SOLN
400.0000 mg | Freq: Once | INTRAVENOUS | Status: AC
  Administered 2021-12-18: 400 mg via INTRAVENOUS
  Filled 2021-12-18: qty 16

## 2021-12-18 NOTE — Progress Notes (Signed)
Hematology and Oncology Follow Up Visit  Michael [name] [id number] 1966/06/20 56 y.o. 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  Secondary diagnosis: Left kidney tumors noted in May 2022.  He was found to have 2.8 x 2.8 x 2.7 mm lesion measuring 2.1 x 2.0 x 1.6.   Prior Therapy: He underwent a right nephrectomy on August 27, 2019 performed while he was living in [place].  The final pathology showed clear cell renal cell carcinoma, nuclear grade 3 with 10% necrosis negative margins.  Final pathological stage was T2ANX.  He is status post Pembrolizumab and axitinib started in March 2021.  He received two cycles of therapy.  Therapy was interrupted because of increased LFTs and autoimmune hepatitis.    He has been receiving his therapy in [place] and relocated to this area.  Axitinib 5 mg daily started on July 05, 2020.  Therapy discontinued on July 13, 2020 because of elevated LFTs.  He is status post cryoablation of a left renal mass completed on May 30, 2021.  Current therapy: Pembrolizumab 200 mg every 3 weeks started on July 27, 2021.  He is here for cycle 7 of therapy.  Interim History: Michael [name] returns today for repeat evaluation.  Since the last visit, he reports no major changes in his health.  He denies any recent hospitalizations or illnesses.  He continues to tolerate treatment without any complaints.  He denies any nausea, vomiting or diarrhea.  He denies any dyspepsia or weight loss.  He does report dry mouth but otherwise no other complaints.    Medications: Reviewed without changes Current Outpatient Medications  Medication Sig Dispense Refill   amLODipine (NORVASC) 5 MG tablet Take 7.5 mg by mouth in the morning.     cholecalciferol (VITAMIN D3) 25 MCG (1000 UNIT) tablet Take 1,000 Units by mouth in the morning.      levothyroxine (SYNTHROID) 100 MCG tablet TAKE 1 TABLET(100 MCG) BY MOUTH DAILY BEFORE AND BREAKFAST 90 tablet 0   levothyroxine (SYNTHROID) 25 MCG tablet TAKE 1 TABLET(25 MCG) BY MOUTH DAILY BEFORE BREAKFAST 90 tablet 1   montelukast (SINGULAIR) 10 MG tablet Take 10 mg by mouth in the morning.     tadalafil (CIALIS) 20 MG tablet Take 10-20 mg by mouth daily as needed for erectile dysfunction.     No current facility-administered medications for this visit.     Allergies:  Allergies  Allergen Reactions   Penicillins Other (See Comments)    Childhood allergies- unk      Physical Exam:       Blood pressure 122/86, pulse 60, temperature 97.9 F (36.6 C), temperature source Temporal, resp. rate 17, height 5\' 6"  (1.676 m), weight 248 lb (112.5 kg), SpO2 100 %.       ECOG: 0     General appearance: Alert, awake without any distress. Head: Atraumatic without abnormalities Oropharynx: Without any thrush or ulcers. Eyes: No scleral icterus. Lymph nodes: No lymphadenopathy noted in the cervical, supraclavicular, or axillary nodes Heart:regular rate and rhythm, without any murmurs or gallops.   Lung: Clear to auscultation without any rhonchi, wheezes or dullness to percussion. Abdomin: Soft, nontender without any shifting dullness or ascites. Musculoskeletal: No clubbing or cyanosis. Neurological: No motor or sensory deficits. Skin: No rashes or lesions.                 Lab Results: Lab  Results  Component Value Date   WBC 6.2 11/09/2021   HGB 14.9 11/09/2021   HCT 43.5 11/09/2021   MCV 87.0 11/09/2021   PLT 242 11/09/2021     Chemistry      Component Value Date/Time   NA 139 11/09/2021 1217   K 3.9 11/09/2021 1217   CL 106 11/09/2021 1217   CO2 24 11/09/2021 1217   BUN 18 11/09/2021 1217   CREATININE 1.21 11/09/2021 1217      Component Value Date/Time   CALCIUM 9.2 11/09/2021 1217   ALKPHOS 90 11/09/2021 1217   AST 27 11/09/2021 1217   ALT  26 11/09/2021 1217   BILITOT 0.6 11/09/2021 1217            Impression and Plan:  56 year old with:   1.  Stage IV clear-cell renal cell carcinoma with pulmonary involvement diagnosed in 2020.  The natural course of this disease was reviewed at this time and treatment choices were discussed.  He continues to be on pembrolizumab without any major complications.  Complications including GI toxicity, immune mediated issues among others were reviewed.  The plan is to update his staging scan for the next visit.  Different salvage therapy options including adding lenvatinib to his current therapy could be considered.  He is agreeable to proceed.   2.  Hypothyroidism: He is currently on Synthroid with TSH normalizing on November 09, 2021.   3.    Left kidney masses: Currently on active surveillance after ablation.  The plan is to update his staging scan with the next visit.   4.  Goals of care and prognosis: His disease is incurable but aggressive measures are warranted at this time.   5.  Dry mouth: I continue to educate him about optimal oral hygiene including hydration.  This appears to be an autoimmune process.   6.  Follow-up: In 6 weeks for repeat follow-up.   30  minutes were dedicated to this visit.  Time spent on reviewing laboratory data, disease status update and outlining future plan of care review.    Zola Button, MD 2/14/202311:42 AM    0 change since

## 2021-12-18 NOTE — Patient Instructions (Signed)
[facility]  Discharge Instructions: ?Thank you for choosing [facility] to provide your oncology and hematology care.  ? ?If you have a lab appointment with the Cancer Center, please go directly to the Cancer Center and check in at the registration area. ?  ?Wear comfortable clothing and clothing appropriate for easy access to any Portacath or PICC line.  ? ?We strive to give you quality time with your provider. You may need to reschedule your appointment if you arrive late (15 or more minutes).  Arriving late affects you and other patients whose appointments are after yours.  Also, if you miss three or more appointments without notifying the office, you may be dismissed from the clinic at the provider?s discretion.    ?  ?For prescription refill requests, have your pharmacy contact our office and allow 72 hours for refills to be completed.   ? ?Today you received the following chemotherapy and/or immunotherapy agents: Keytruda ?  ?To help prevent nausea and vomiting after your treatment, we encourage you to take your nausea medication as directed. ? ?BELOW ARE SYMPTOMS THAT SHOULD BE REPORTED IMMEDIATELY: ?*FEVER GREATER THAN 100.4 F (38 ?C) OR HIGHER ?*CHILLS OR SWEATING ?*NAUSEA AND VOMITING THAT IS NOT CONTROLLED WITH YOUR NAUSEA MEDICATION ?*UNUSUAL SHORTNESS OF BREATH ?*UNUSUAL BRUISING OR BLEEDING ?*URINARY PROBLEMS (pain or burning when urinating, or frequent urination) ?*BOWEL PROBLEMS (unusual diarrhea, constipation, pain near the anus) ?TENDERNESS IN MOUTH AND THROAT WITH OR WITHOUT PRESENCE OF ULCERS (sore throat, sores in mouth, or a toothache) ?UNUSUAL RASH, SWELLING OR PAIN  ?UNUSUAL VAGINAL DISCHARGE OR ITCHING  ? ?Items with * indicate a potential emergency and should be followed up as soon as possible or go to the Emergency Department if any problems should occur. ? ?Please show the CHEMOTHERAPY ALERT CARD or IMMUNOTHERAPY ALERT CARD at check-in to the  Emergency Department and triage nurse. ? ?Should you have questions after your visit or need to cancel or reschedule your appointment, please contact [facility]  Dept: [phone]  and follow the prompts.  Office hours are 8:00 a.m. to 4:30 p.m. Monday - Friday. Please note that voicemails left after 4:00 p.m. may not be returned until the following business day.  We are closed weekends and major holidays. You have access to a nurse at all times for urgent questions. Please call the main number to the clinic Dept: [phone] and follow the prompts. ? ? ?For any non-urgent questions, you may also contact your provider using MyChart. We now offer e-Visits for anyone 18 and older to request care online for non-urgent symptoms. For details visit [email or web address]. ?  ?Also download the MyChart app! Go to the app store, search "MyChart", open the app, select [facility], and log in with your MyChart username and password. ? ?Due to Covid, a mask is required upon entering the hospital/clinic. If you do not have a mask, one will be given to you upon arrival. For doctor visits, patients may have 1 support person aged 18 or older with them. For treatment visits, patients cannot have anyone with them due to current Covid guidelines and our immunocompromised population.  ? ?

## 2022-01-21 DIAGNOSIS — N1831 Chronic kidney disease, stage 3a: Secondary | ICD-10-CM | POA: Diagnosis not present

## 2022-01-21 DIAGNOSIS — C641 Malignant neoplasm of right kidney, except renal pelvis: Secondary | ICD-10-CM | POA: Diagnosis not present

## 2022-01-21 DIAGNOSIS — I129 Hypertensive chronic kidney disease with stage 1 through stage 4 chronic kidney disease, or unspecified chronic kidney disease: Secondary | ICD-10-CM | POA: Diagnosis not present

## 2022-01-21 DIAGNOSIS — E039 Hypothyroidism, unspecified: Secondary | ICD-10-CM | POA: Diagnosis not present

## 2022-01-22 ENCOUNTER — Ambulatory Visit (HOSPITAL_COMMUNITY)
Admission: RE | Admit: 2022-01-22 | Discharge: 2022-01-22 | Disposition: A | Discharge status: Home / Self Care | Payer: BC Managed Care – PPO | Source: Ambulatory Visit | Attending: Oncology | Admitting: Oncology

## 2022-01-22 ENCOUNTER — Other Ambulatory Visit: Payer: Self-pay

## 2022-01-22 DIAGNOSIS — N2889 Other specified disorders of kidney and ureter: Secondary | ICD-10-CM | POA: Diagnosis not present

## 2022-01-22 DIAGNOSIS — Z905 Acquired absence of kidney: Secondary | ICD-10-CM | POA: Diagnosis not present

## 2022-01-22 DIAGNOSIS — R918 Other nonspecific abnormal finding of lung field: Secondary | ICD-10-CM | POA: Diagnosis not present

## 2022-01-22 DIAGNOSIS — C649 Malignant neoplasm of unspecified kidney, except renal pelvis: Secondary | ICD-10-CM | POA: Insufficient documentation

## 2022-01-22 DIAGNOSIS — K7689 Other specified diseases of liver: Secondary | ICD-10-CM | POA: Diagnosis not present

## 2022-01-22 IMAGING — CT CT CHEST W/O CM
2 of 4 series · 15 of 36 positions shown, 18 images · non-contrast
Comparison: Multiple priors including most recent CT [DATE]

CLINICAL DATA: History of metastatic renal cell carcinoma, active
surveillance



[Series 2: thorax · axial · 0.89mm/px · z∈[-350,-62]mm · 12 of 168 slices shown, 15 images]
[im 12/168  mediastinal]
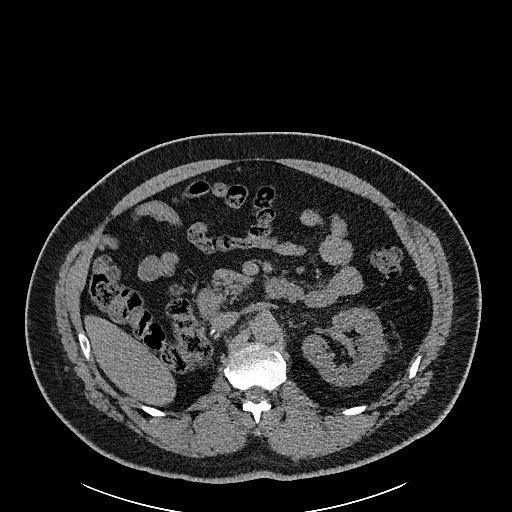
[im 12/168  lung]
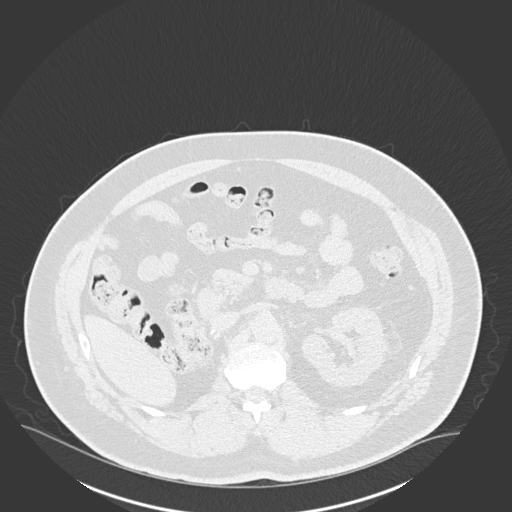
[im 24/168  lung]
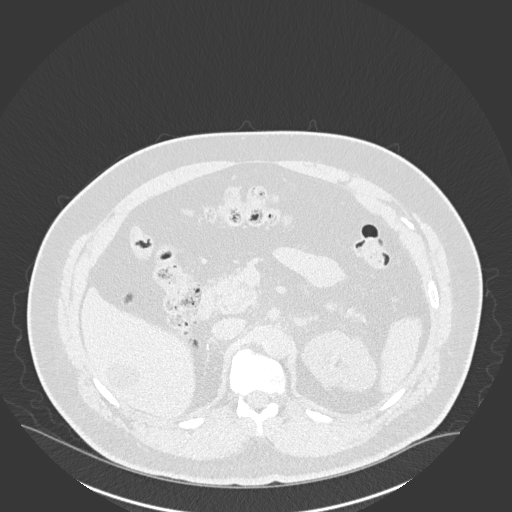
[im 36/168  lung]
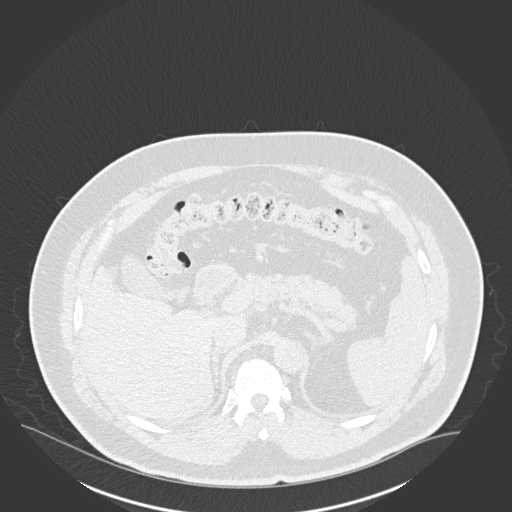
[im 48/168  lung]
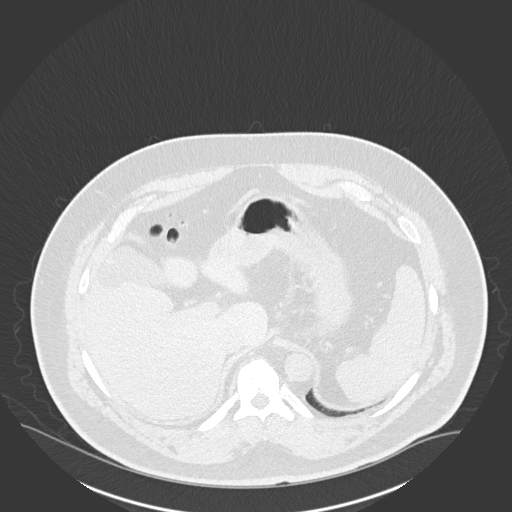
[im 60/168  mediastinal]
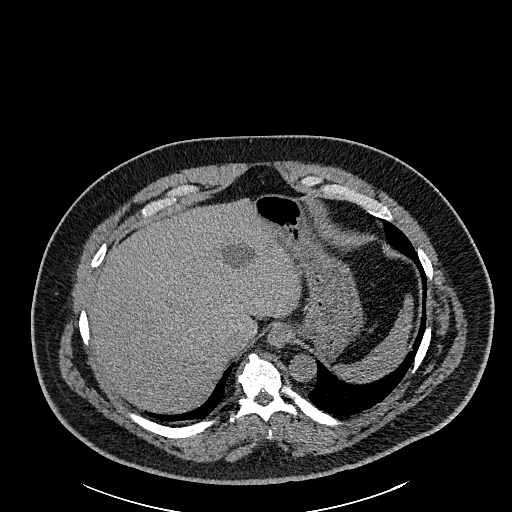
[im 60/168  lung]
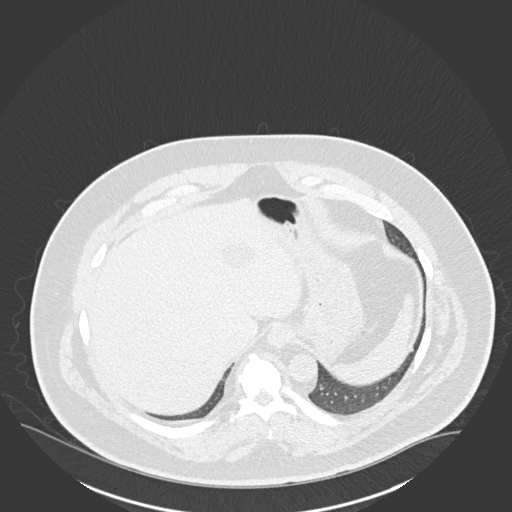
[im 72/168  lung]
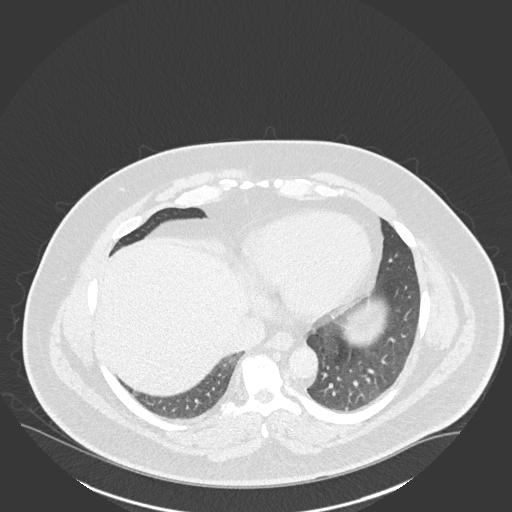
[im 96/168  lung]
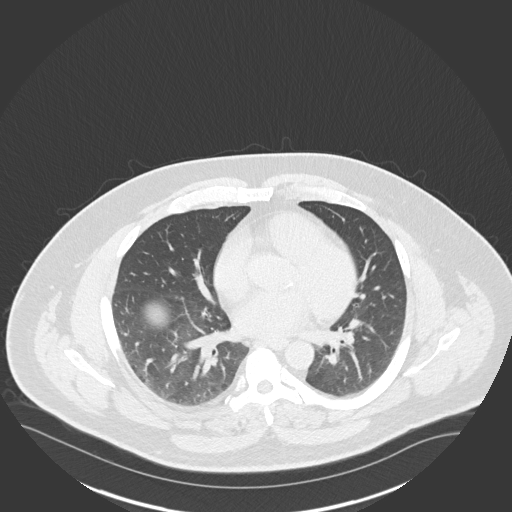
[im 108/168  lung]
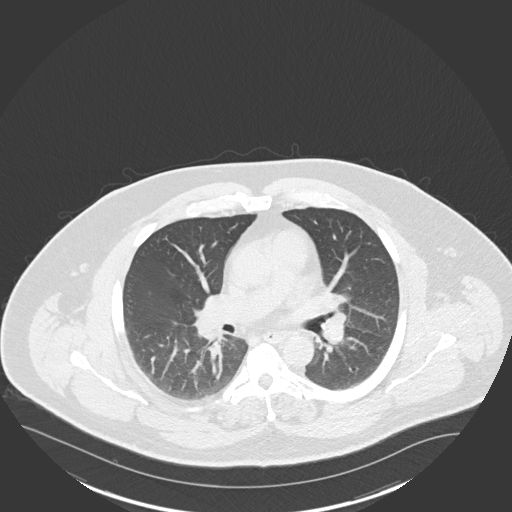
[im 120/168  mediastinal]
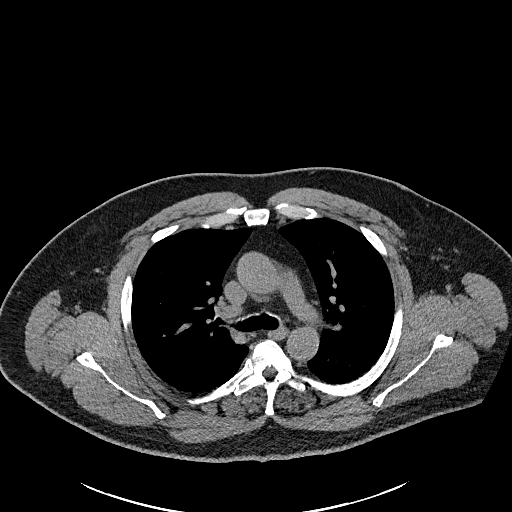
[im 120/168  lung]
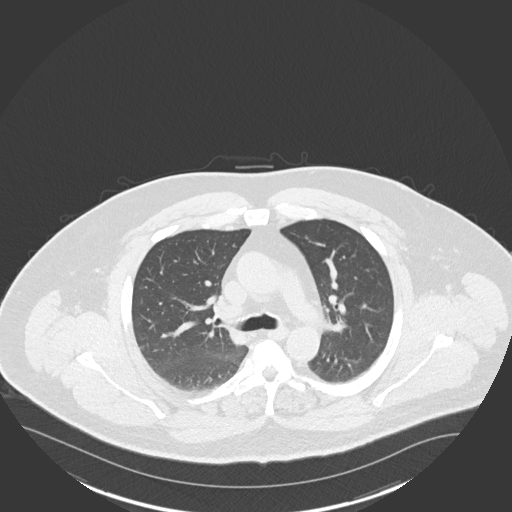
[im 132/168  lung]
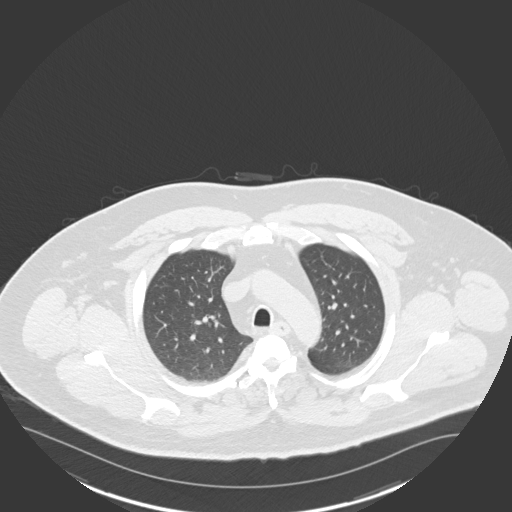
[im 144/168  lung]
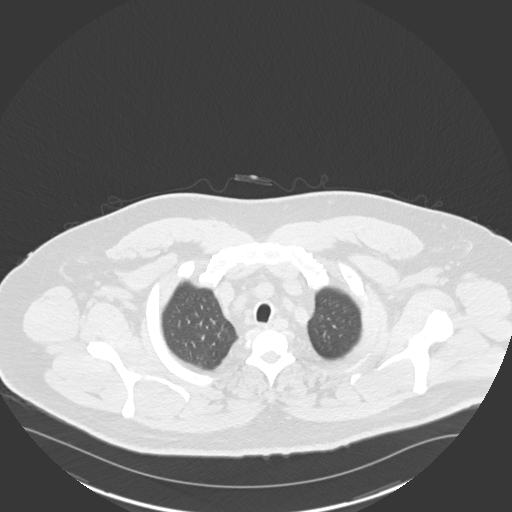
[im 156/168  lung]
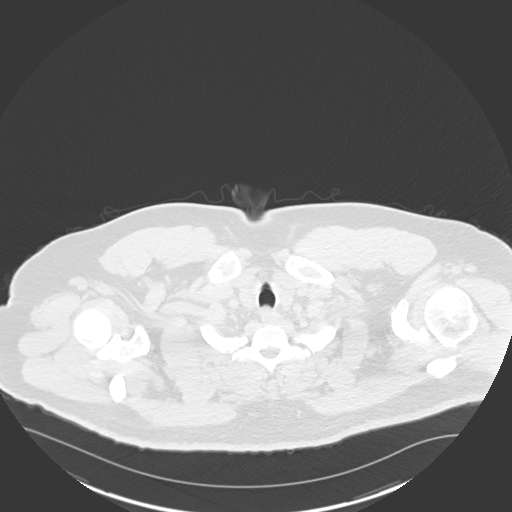

[Series 6: coronal · coronal · 0.70mm/px · 3 of 160 slices shown]
[im 32/160  lung]
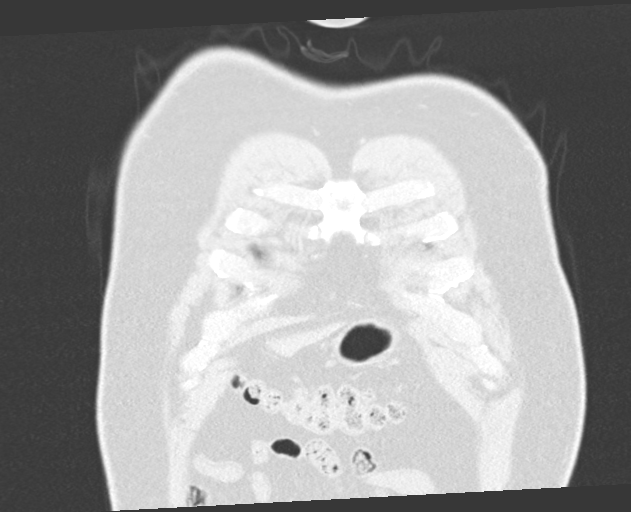
[im 64/160  lung]
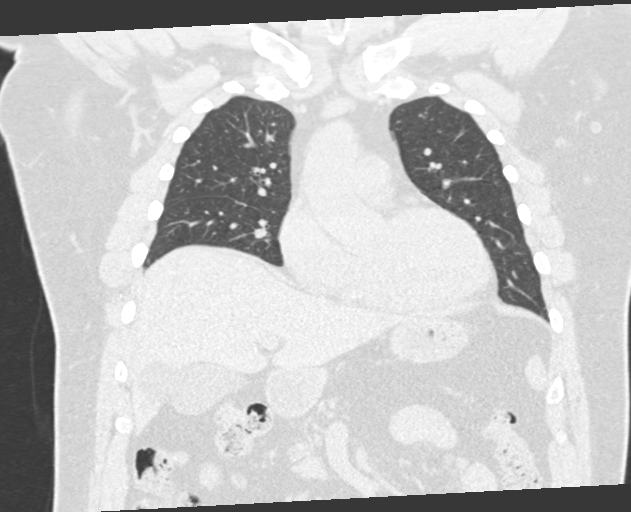
[im 96/160  lung]
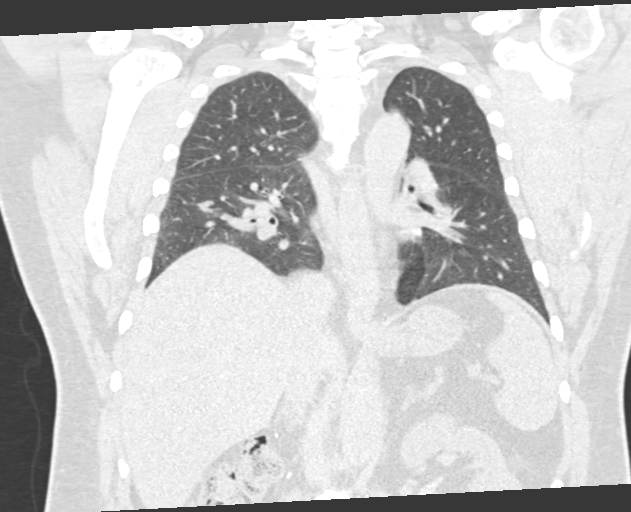

[15 of 36 positions shown; findings below may reference images not displayed]

FINDINGS: Cardiovascular: Normal caliber abdominal aorta. Aortic
atherosclerosis. Normal caliber central pulmonary arteries. Normal
size heart. No significant pericardial effusion/thickening.

Mediastinum/Nodes: No discrete thyroid nodule. No pathologically
enlarged mediastinal, hilar or axillary lymph nodes, noting limited
sensitivity for the detection of hilar adenopathy on this
noncontrast study. Esophagus is grossly unremarkable.

Lungs/Pleura: Decreased size and conspicuity of the multiple
scattered bilateral pulmonary nodules. No new suspicious pulmonary
nodules or masses. Index nodules are as follows:

-subpleural nodule in the dependent right lower lobe is not seen on
today's examination, previously measuring 2 mm.

-subpleural nodule in the left lung base is not evident on today's
examination previously measuring 2 mm.

-right lower lobe pulmonary nodule measures 3 mm on image 72/2
previously 5 mm.

-anterior left upper lobe pulmonary nodule is not evident on today's
examination previously measuring 4 mm.

No pleural effusion.  No pneumothorax.

Upper Abdomen: Stable hepatic cysts. Partially visualized right
nephrectomy bed. No acute abnormality.

Musculoskeletal: No aggressive lytic or blastic lesion of bone.
IMPRESSION: Decreased size and conspicuity of the multiple scattered bilateral
pulmonary nodules. No new suspicious pulmonary nodules or masses.

Aortic Atherosclerosis ([UR]-[UR]).

## 2022-01-22 IMAGING — MR MR ABDOMEN WO/W CM
19 series · 48 of 48 positions shown · IV contrast (gadavist)
Comparison: MRI abdomen [DATE], CT abdomen [DATE]

CLINICAL DATA: Kidney cancer surveillance

EXAM:
MRI ABDOMEN WITHOUT AND WITH CONTRAST
TECHNIQUE: Multiplanar multisequence MR imaging of the abdomen was performed
both before and after the administration of intravenous contrast.
CONTRAST:  10mL GADAVIST GADOBUTROL 1 MMOL/ML IV SOLN

[Series 3: T2 · coronal · 6.0mm · 1.56mm/px · 2 of 40 slices shown (1 of 2)]
[im 1/40]
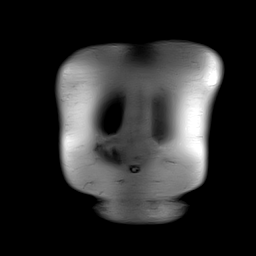
[im 40/40]
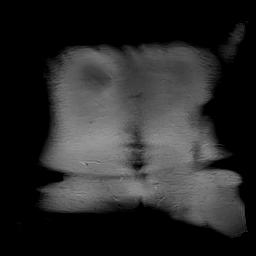

[Series 4: T2 fat-sat · 2 of 8 slices shown (1 of 2)]
[im 1/8]
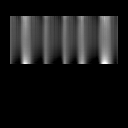
[im 8/8]
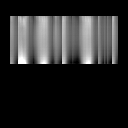

[Series 5: T2 fat-sat · axial · 6.0mm · 1.25mm/px · 1 of 38 slices shown (2 of 2)]
[im 1/38]
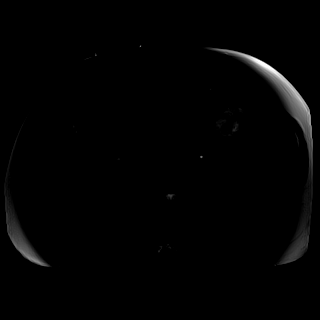

[Series 6: T1 · axial · 3.0mm · 1.25mm/px · z∈[-124,+161]mm · 3 of 96 slices shown (1 of 2)]
[im 1/96]
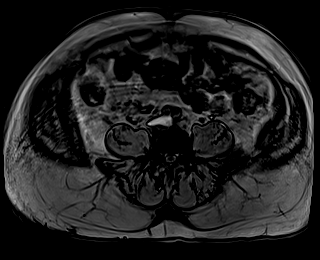
[im 48/96]
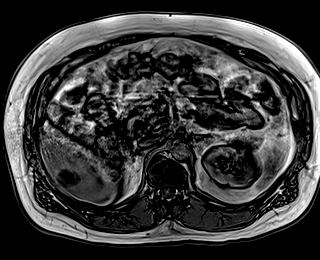
[im 96/96]
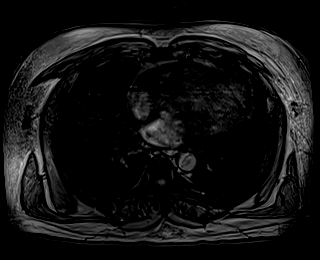

[Series 7: T1 · axial · 3.0mm · 1.25mm/px · z∈[-124,+161]mm · 3 of 96 slices shown (2 of 2)]
[im 1/96]
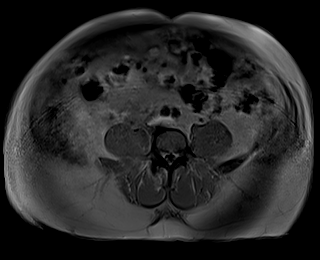
[im 48/96]
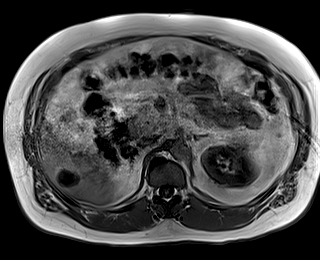
[im 96/96]
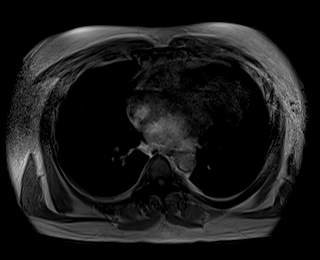

[Series 8: DWI · axial · 6.0mm · 1.49mm/px · z∈[-118,+163]mm · 3 of 80 slices shown (1 of 2)]
[im 1/80]
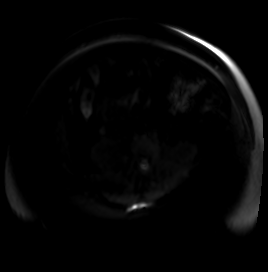
[im 40/80]
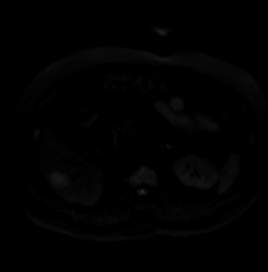
[im 80/80]
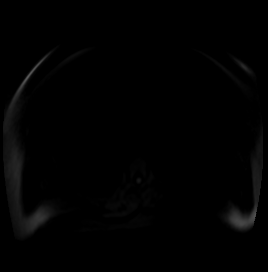

[Series 9: DWI · axial · 6.0mm · 1.49mm/px · 1 of 40 slices shown (2 of 2)]
[im 1/40]
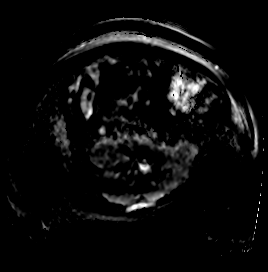

[Series 10: bSSFP · axial · 5.0mm · 0.84mm/px · z∈[-109,+161]mm · 2 of 50 slices shown]
[im 1/50]
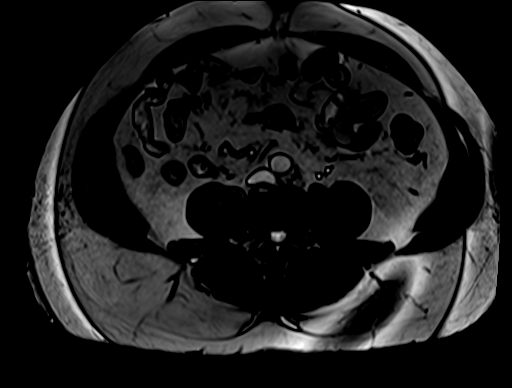
[im 50/50]
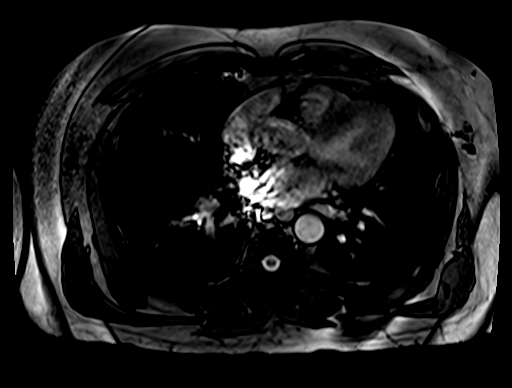

[Series 12: T1 dynamic · axial · 3.0mm · 1.25mm/px · z∈[-124,+161]mm · 3 of 96 slices shown (1 of 10)]
[im 1/96]
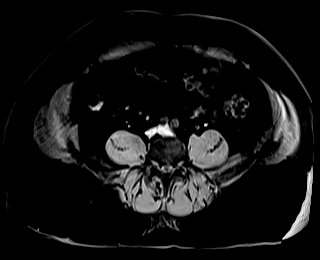
[im 48/96]
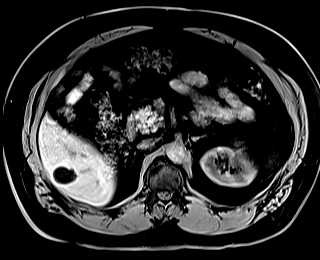
[im 96/96]
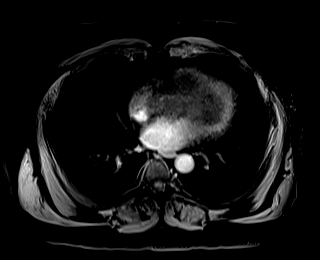

[Series 16: T1 dynamic · axial · 3.0mm · 1.25mm/px · z∈[-124,+161]mm · 3 of 96 slices shown (2 of 10)]
[im 1/96]
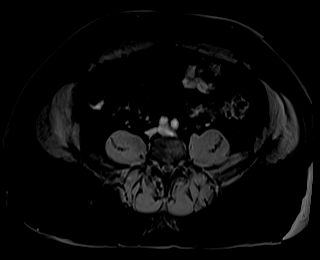
[im 48/96]
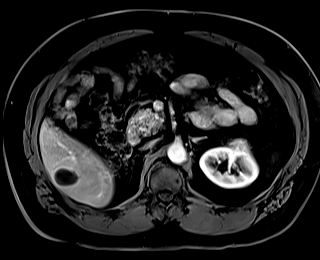
[im 96/96]
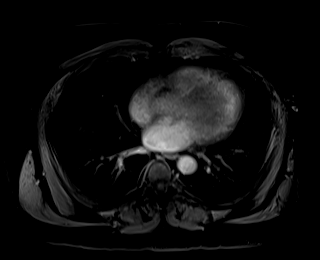

[Series 17: T1 dynamic · axial · 3.0mm · 1.25mm/px · z∈[-124,+161]mm · 3 of 96 slices shown (3 of 10)]
[im 1/96]
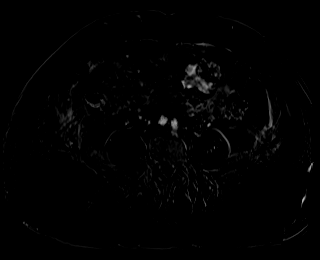
[im 48/96]
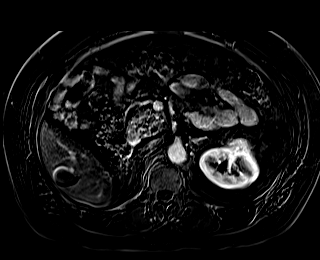
[im 96/96]
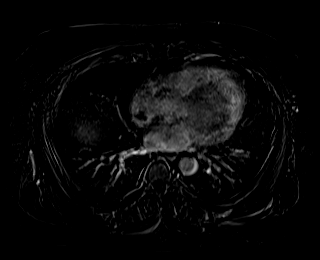

[Series 20: T1 dynamic · axial · 3.0mm · 1.25mm/px · z∈[-124,+161]mm · 3 of 96 slices shown (4 of 10)]
[im 1/96]
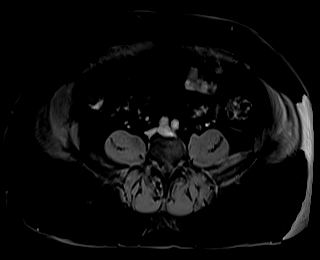
[im 48/96]
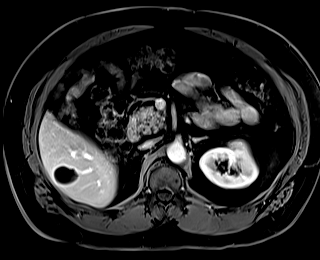
[im 96/96]
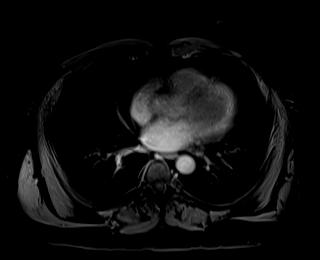

[Series 21: T1 dynamic · axial · 3.0mm · 1.25mm/px · z∈[-124,+161]mm · 3 of 96 slices shown (5 of 10)]
[im 1/96]
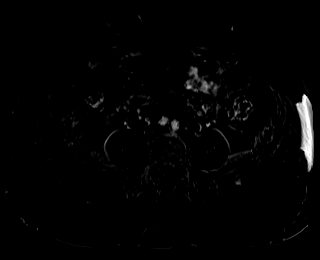
[im 48/96]
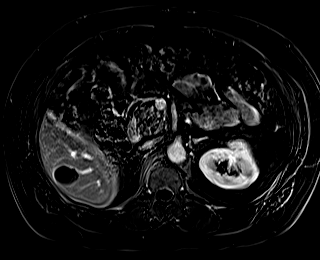
[im 96/96]
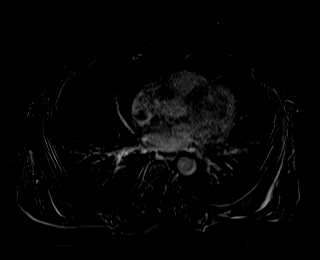

[Series 24: T1 dynamic · axial · 3.0mm · 1.25mm/px · z∈[-124,+161]mm · 3 of 96 slices shown (6 of 10)]
[im 1/96]
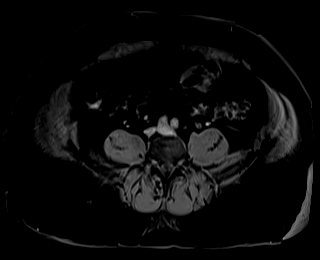
[im 48/96]
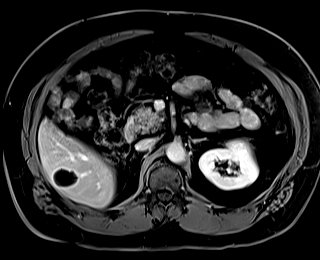
[im 96/96]
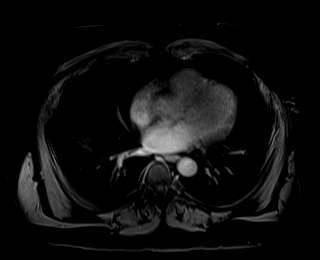

[Series 25: T1 dynamic · axial · 3.0mm · 1.25mm/px · z∈[-124,+161]mm · 3 of 96 slices shown (7 of 10)]
[im 1/96]
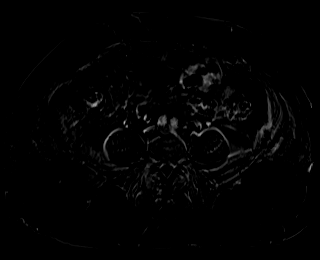
[im 48/96]
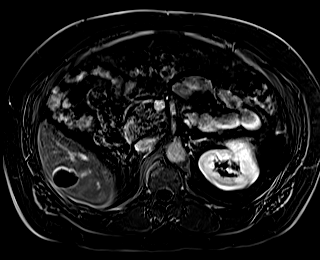
[im 96/96]
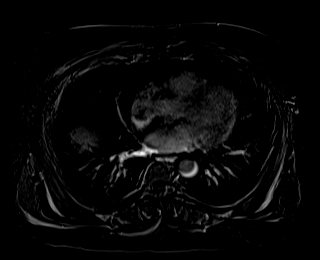

[Series 27: T1 dynamic · coronal · 3.0mm · 1.41mm/px · 3 of 88 slices shown (8 of 10)]
[im 1/88]
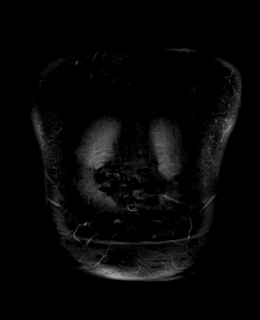
[im 44/88]
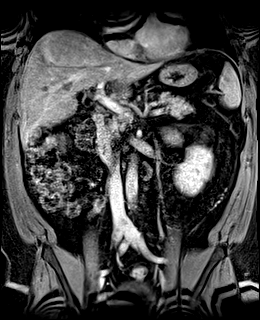
[im 88/88]
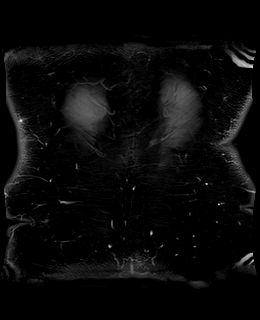

[Series 28: T2 · axial · 6.0mm · 1.56mm/px · 1 of 38 slices shown (2 of 2)]
[im 1/38]
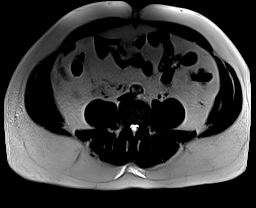

[Series 31: T1 dynamic · axial · 3.0mm · 1.25mm/px · z∈[-124,+161]mm · 3 of 96 slices shown (9 of 10)]
[im 1/96]
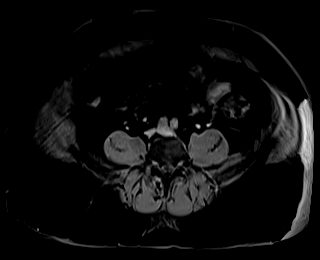
[im 48/96]
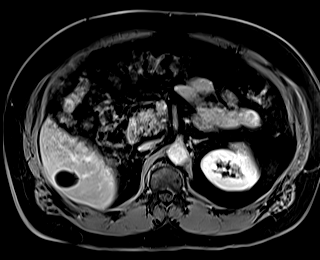
[im 96/96]
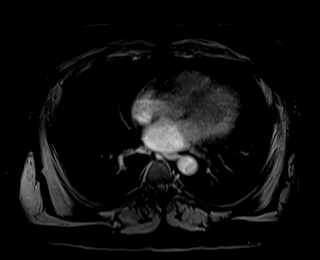

[Series 32: T1 dynamic · axial · 3.0mm · 1.25mm/px · z∈[-124,+161]mm · 3 of 96 slices shown (10 of 10)]
[im 1/96]
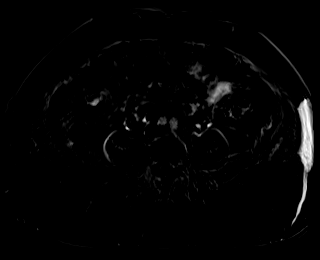
[im 48/96]
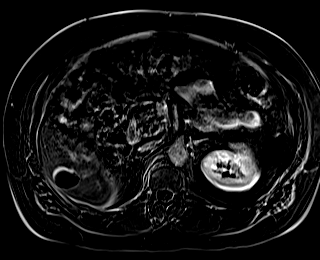
[im 96/96]
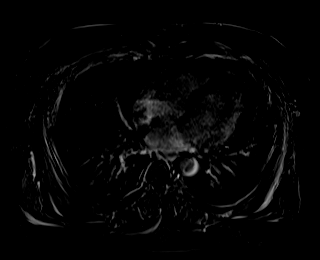

[48 of 48 positions shown; findings below may reference images not displayed]

FINDINGS: Lower chest: No acute findings.

Hepatobiliary: Liver is normal in size and contour. There are
multiple hepatic cysts again seen throughout the liver measuring up
to 3 cm in the left lobe and 3 cm in the right lobe. Gallbladder
appears normal. No biliary ductal dilatation.

Pancreas: No mass, inflammatory changes, or other parenchymal
abnormality identified.

Spleen:  Within normal limits in size and appearance.

Adrenals/Urinary Tract: Adrenal glands appear normal. Previous right
nephrectomy surgical changes with no suspicious findings in the
nephrectomy bed region. Heterogeneous partially exophytic mass in
the mid left kidney appears mildly decreased in size since previous
measuring 2.8 x 2.4 cm in axial dimensions, with no definite nodular
enhancement appreciated. No hydronephrosis.

Stomach/Bowel: No evidence of bowel obstruction.

Vascular/Lymphatic: No pathologically enlarged lymph nodes
identified. Left renal vein appears patent. No abdominal aortic
aneurysm demonstrated.

Other:  No ascites

Musculoskeletal: No suspicious bone lesions identified.
IMPRESSION: 1. Previously ablated mass in the left kidney is mildly decreased in
size since previous study. No evidence of metastatic disease in the
abdomen. Continued follow-up recommended.
2. Previous right nephrectomy changes.
3. Hepatic cysts.

## 2022-01-22 MED ORDER — GADOBUTROL 1 MMOL/ML IV SOLN
10.0000 mL | Freq: Once | INTRAVENOUS | Status: AC | PRN
  Administered 2022-01-22: 10 mL via INTRAVENOUS

## 2022-01-24 ENCOUNTER — Other Ambulatory Visit: Payer: Self-pay | Admitting: Oncology

## 2022-01-30 ENCOUNTER — Inpatient Hospital Stay (HOSPITAL_BASED_OUTPATIENT_CLINIC_OR_DEPARTMENT_OTHER): Payer: BC Managed Care – PPO | Admitting: Oncology

## 2022-01-30 ENCOUNTER — Inpatient Hospital Stay: Payer: BC Managed Care – PPO | Attending: Oncology

## 2022-01-30 ENCOUNTER — Other Ambulatory Visit: Payer: Self-pay

## 2022-01-30 ENCOUNTER — Inpatient Hospital Stay: Payer: BC Managed Care – PPO

## 2022-01-30 VITALS — BP 128/86 | HR 61 | Temp 97.9°F | Resp 18 | Ht 66.0 in | Wt 247.5 lb

## 2022-01-30 DIAGNOSIS — Z79890 Hormone replacement therapy (postmenopausal): Secondary | ICD-10-CM | POA: Diagnosis not present

## 2022-01-30 DIAGNOSIS — C7800 Secondary malignant neoplasm of unspecified lung: Secondary | ICD-10-CM | POA: Insufficient documentation

## 2022-01-30 DIAGNOSIS — C649 Malignant neoplasm of unspecified kidney, except renal pelvis: Secondary | ICD-10-CM

## 2022-01-30 DIAGNOSIS — K754 Autoimmune hepatitis: Secondary | ICD-10-CM | POA: Insufficient documentation

## 2022-01-30 DIAGNOSIS — Z5112 Encounter for antineoplastic immunotherapy: Secondary | ICD-10-CM | POA: Diagnosis not present

## 2022-01-30 DIAGNOSIS — Z880 Allergy status to penicillin: Secondary | ICD-10-CM | POA: Diagnosis not present

## 2022-01-30 DIAGNOSIS — R7989 Other specified abnormal findings of blood chemistry: Secondary | ICD-10-CM | POA: Insufficient documentation

## 2022-01-30 DIAGNOSIS — E039 Hypothyroidism, unspecified: Secondary | ICD-10-CM | POA: Insufficient documentation

## 2022-01-30 DIAGNOSIS — C641 Malignant neoplasm of right kidney, except renal pelvis: Secondary | ICD-10-CM | POA: Insufficient documentation

## 2022-01-30 DIAGNOSIS — Z905 Acquired absence of kidney: Secondary | ICD-10-CM | POA: Diagnosis not present

## 2022-01-30 DIAGNOSIS — R682 Dry mouth, unspecified: Secondary | ICD-10-CM | POA: Diagnosis not present

## 2022-01-30 DIAGNOSIS — Z79899 Other long term (current) drug therapy: Secondary | ICD-10-CM | POA: Insufficient documentation

## 2022-01-30 LAB — CBC WITH DIFFERENTIAL (CANCER CENTER ONLY)
Abs Immature Granulocytes: 0.01 10*3/uL (ref 0.00–0.07)
Basophils Absolute: 0.1 10*3/uL (ref 0.0–0.1)
Basophils Relative: 1 %
Eosinophils Absolute: 0.8 10*3/uL — ABNORMAL HIGH (ref 0.0–0.5)
Eosinophils Relative: 11 %
HCT: 43.9 % (ref 39.0–52.0)
Hemoglobin: 15 g/dL (ref 13.0–17.0)
Immature Granulocytes: 0 %
Lymphocytes Relative: 26 %
Lymphs Abs: 1.9 10*3/uL (ref 0.7–4.0)
MCH: 29.5 pg (ref 26.0–34.0)
MCHC: 34.2 g/dL (ref 30.0–36.0)
MCV: 86.4 fL (ref 80.0–100.0)
Monocytes Absolute: 0.6 10*3/uL (ref 0.1–1.0)
Monocytes Relative: 8 %
Neutro Abs: 3.9 10*3/uL (ref 1.7–7.7)
Neutrophils Relative %: 54 %
Platelet Count: 243 10*3/uL (ref 150–400)
RBC: 5.08 MIL/uL (ref 4.22–5.81)
RDW: 12.7 % (ref 11.5–15.5)
WBC Count: 7.3 10*3/uL (ref 4.0–10.5)
nRBC: 0 % (ref 0.0–0.2)

## 2022-01-30 LAB — CMP (CANCER CENTER ONLY)
ALT: 18 U/L (ref 0–44)
AST: 19 U/L (ref 15–41)
Albumin: 4.2 g/dL (ref 3.5–5.0)
Alkaline Phosphatase: 93 U/L (ref 38–126)
Anion gap: 7 (ref 5–15)
BUN: 14 mg/dL (ref 6–20)
CO2: 26 mmol/L (ref 22–32)
Calcium: 9 mg/dL (ref 8.9–10.3)
Chloride: 107 mmol/L (ref 98–111)
Creatinine: 1.27 mg/dL — ABNORMAL HIGH (ref 0.61–1.24)
GFR, Estimated: >60 mL/min (ref >60)
Glucose, Bld: 100 mg/dL — ABNORMAL HIGH (ref 70–99)
Potassium: 4 mmol/L (ref 3.5–5.1)
Sodium: 140 mmol/L (ref 135–145)
Total Bilirubin: 0.6 mg/dL (ref 0.3–1.2)
Total Protein: 7.3 g/dL (ref 6.5–8.1)

## 2022-01-30 LAB — TSH: TSH: 4.688 u[IU]/mL — ABNORMAL HIGH (ref 0.320–4.118)

## 2022-01-30 MED ORDER — SODIUM CHLORIDE 0.9 % IV SOLN: Freq: Once | INTRAVENOUS | Status: AC

## 2022-01-30 MED ORDER — SODIUM CHLORIDE 0.9 % IV SOLN
400.0000 mg | Freq: Once | INTRAVENOUS | Status: AC
  Administered 2022-01-30: 400 mg via INTRAVENOUS
  Filled 2022-01-30: qty 16

## 2022-01-30 NOTE — Progress Notes (Signed)
Hematology and Oncology Follow Up Visit ? ?Michael [name] ?[id number] ?23-Feb-1966 56 y.o. ?01/30/2022 10:13 AM ?[name], MDRusso, [name], MD  ? ?Principle Diagnosis: 57 year old man with kidney cancer diagnosed in 2020.  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 ? ?Secondary diagnosis: Left kidney tumors noted in May 2022.  He was found to have 2.8 x 2.8 x 2.7 mm lesion measuring 2.1 x 2.0 x 1.6. ? ? ?Prior Therapy: ?He underwent a right nephrectomy on August 27, 2019 performed while he was living in [place].  The final pathology showed clear cell renal cell carcinoma, nuclear grade 3 with 10% necrosis negative margins.  Final pathological stage was T2ANX. ? ?He is status post Pembrolizumab and axitinib started in March 2021.  He received two cycles of therapy.  Therapy was interrupted because of increased LFTs and autoimmune hepatitis.   ? ?He has been receiving his therapy in [place] and relocated to this area. ? ?Axitinib 5 mg daily started on July 05, 2020.  Therapy discontinued on July 13, 2020 because of elevated LFTs. ? ?He is status post cryoablation of a left renal mass completed on May 30, 2021. ? ?Current therapy: Pembrolizumab 200 mg every 3 weeks started on July 27, 2021.  He was switched to 400 mg every 6 weeks started on November 09, 2021.  He returns for the subsequent treatment. ? ?Interim History: Mr. [name] presents today for repeat evaluation.  Since the last visit, he reports no major changes in his health.  He continues to tolerate current treatment without any complaints.  He denies any nausea, vomiting or abdominal pain.  He denies any recent hospitalizations or illnesses.  He does report some occasional skin bumps that arise periodically although they subsequently disappear spontaneously.  He denies any shortness of breath, difficulty breathing or urinary complaints. ? ? ? ?Medications: Reviewed without changes. ?Current Outpatient  Medications  ?Medication Sig Dispense Refill  ? amLODipine (NORVASC) 5 MG tablet Take 7.5 mg by mouth in the morning.    ? cholecalciferol (VITAMIN D3) 25 MCG (1000 UNIT) tablet Take 1,000 Units by mouth in the morning.    ? levothyroxine (SYNTHROID) 100 MCG tablet TAKE 1 TABLET(100 MCG) BY MOUTH DAILY BEFORE BREAKFAST 90 tablet 0  ? levothyroxine (SYNTHROID) 25 MCG tablet TAKE 1 TABLET(25 MCG) BY MOUTH DAILY BEFORE BREAKFAST 90 tablet 1  ? montelukast (SINGULAIR) 10 MG tablet Take 10 mg by mouth in the morning.    ? tadalafil (CIALIS) 20 MG tablet Take 10-20 mg by mouth daily as needed for erectile dysfunction.    ? ?No current facility-administered medications for this visit.  ? ? ? ?Allergies:  ?Allergies  ?Allergen Reactions  ? Penicillins Other (See Comments)  ?  Childhood allergies- unk  ? ? ? ? ?Physical Exam: ? ? ? ? ? ?Blood pressure 128/86, pulse 61, temperature 97.9 ?F (36.6 ?C), temperature source Temporal, resp. rate 18, height '5\' 6"'$  (1.676 m), weight 247 lb 8 oz (112.3 kg), SpO2 (!) 18 %. ? ? ? ? ? ? ? ? ?ECOG: 0 ? ? ?General appearance: Comfortable appearing without any discomfort ?Head: Normocephalic without any trauma ?Oropharynx: Mucous membranes are moist and pink without any thrush or ulcers. ?Eyes: Pupils are equal and round reactive to light. ?Lymph nodes: No cervical, supraclavicular, inguinal or axillary lymphadenopathy.   ?Heart:regular rate and rhythm.  S1 and S2 without leg edema. ?Lung: Clear without any rhonchi or wheezes.  No dullness to percussion. ?Abdomin: Soft,  nontender, nondistended with good bowel sounds.  No hepatosplenomegaly. ?Musculoskeletal: No joint deformity or effusion.  Full range of motion noted. ?Neurological: No deficits noted on motor, sensory and deep tendon reflex exam. ?Skin: No petechial rash or dryness.  Appeared moist.  ? ? ? ? ? ? ? ? ? ? ? ? ? ? ? ? ? ?Lab Results: ?Lab Results  ?Component Value Date  ? WBC 6.7 12/18/2021  ? HGB 15.9 12/18/2021  ? HCT 45.0  12/18/2021  ? MCV 84.7 12/18/2021  ? PLT 258 12/18/2021  ? ?  Chemistry   ?   ?Component Value Date/Time  ? NA 138 12/18/2021 1144  ? K 3.9 12/18/2021 1144  ? CL 105 12/18/2021 1144  ? CO2 25 12/18/2021 1144  ? BUN 16 12/18/2021 1144  ? CREATININE 1.25 (H) 12/18/2021 1144  ?    ?Component Value Date/Time  ? CALCIUM 9.2 12/18/2021 1144  ? ALKPHOS 93 12/18/2021 1144  ? AST 27 12/18/2021 1144  ? ALT 23 12/18/2021 1144  ? BILITOT 0.6 12/18/2021 1144  ?  ? ? ? ? ?   ?IMPRESSION: ?Decreased size and conspicuity of the multiple scattered bilateral ?pulmonary nodules. No new suspicious pulmonary nodules or masses. ?  ? ? ?Impression and Plan: ? ?56 year old with: ?  ?1.  Kidney cancer diagnosed in 2020.  He developed stage IV clear-cell with pulmonary involvement from 2021. ? ?He is currently on single agent Pembrolizumab with reasonable response to therapy.  CT scan of the chest obtained on January 22, 2022 was personally reviewed and continues to show positive response and decrease in his pulmonary nodules.  Risks and benefits of continuing this treatment were discussed.  Complications that include immune mediated issues, GI toxicity among others were reiterated.  He is agreeable to continue. ? ?  ?2.  Hypothyroidism: He continues to be on Synthroid replacement with TSH close to normal range.  No adjustment needed to his dosing. ?  ?3.    Left kidney masses: He is status post cryoablation.  MRI in March 2023 was personally reviewed and showed continues decrease and his lesions.  We will communicate with Dr. Kathlene Cote regarding these findings and whether additional treatment is required. ?  ?4.  Goals of care and prognosis: Therapy remains palliative although aggressive measures are warranted given his excellent performance status and limited disease. ?  ?5.  Dry mouth: Likely autoimmune in nature.  Conservative measures at this time recommended.   ?  ?6.  Follow-up: He will return in 6 weeks for a follow-up visit. ?  ?30   minutes were spent on this encounter.  The time was dedicated to reviewing laboratory data, disease status update and outlining future plan of care. ? ? ? ?Zola Button, MD ?3/29/202310:13 AM  ? ? ?0 change since ?

## 2022-01-30 NOTE — Patient Instructions (Signed)
[facility]  Discharge Instructions: ?Thank you for choosing [facility] to provide your oncology and hematology care.  ? ?If you have a lab appointment with the Cancer Center, please go directly to the Cancer Center and check in at the registration area. ?  ?Wear comfortable clothing and clothing appropriate for easy access to any Portacath or PICC line.  ? ?We strive to give you quality time with your provider. You may need to reschedule your appointment if you arrive late (15 or more minutes).  Arriving late affects you and other patients whose appointments are after yours.  Also, if you miss three or more appointments without notifying the office, you may be dismissed from the clinic at the provider?s discretion.    ?  ?For prescription refill requests, have your pharmacy contact our office and allow 72 hours for refills to be completed.   ? ?Today you received the following chemotherapy and/or immunotherapy agent: Keytruda    ?  ?To help prevent nausea and vomiting after your treatment, we encourage you to take your nausea medication as directed. ? ?BELOW ARE SYMPTOMS THAT SHOULD BE REPORTED IMMEDIATELY: ?*FEVER GREATER THAN 100.4 F (38 ?C) OR HIGHER ?*CHILLS OR SWEATING ?*NAUSEA AND VOMITING THAT IS NOT CONTROLLED WITH YOUR NAUSEA MEDICATION ?*UNUSUAL SHORTNESS OF BREATH ?*UNUSUAL BRUISING OR BLEEDING ?*URINARY PROBLEMS (pain or burning when urinating, or frequent urination) ?*BOWEL PROBLEMS (unusual diarrhea, constipation, pain near the anus) ?TENDERNESS IN MOUTH AND THROAT WITH OR WITHOUT PRESENCE OF ULCERS (sore throat, sores in mouth, or a toothache) ?UNUSUAL RASH, SWELLING OR PAIN  ?UNUSUAL VAGINAL DISCHARGE OR ITCHING  ? ?Items with * indicate a potential emergency and should be followed up as soon as possible or go to the Emergency Department if any problems should occur. ? ?Please show the CHEMOTHERAPY ALERT CARD or IMMUNOTHERAPY ALERT CARD at check-in to  the Emergency Department and triage nurse. ? ?Should you have questions after your visit or need to cancel or reschedule your appointment, please contact [facility]  Dept: [phone]  and follow the prompts.  Office hours are 8:00 a.m. to 4:30 p.m. Monday - Friday. Please note that voicemails left after 4:00 p.m. may not be returned until the following business day.  We are closed weekends and major holidays. You have access to a nurse at all times for urgent questions. Please call the main number to the clinic Dept: [phone] and follow the prompts. ? ? ?For any non-urgent questions, you may also contact your provider using MyChart. We now offer e-Visits for anyone 18 and older to request care online for non-urgent symptoms. For details visit [email or web address]. ?  ?Also download the MyChart app! Go to the app store, search "MyChart", open the app, select [facility], and log in with your MyChart username and password. ? ?Due to Covid, a mask is required upon entering the hospital/clinic. If you do not have a mask, one will be given to you upon arrival. For doctor visits, patients may have 1 support person aged 18 or older with them. For treatment visits, patients cannot have anyone with them due to current Covid guidelines and our immunocompromised population.  ? ?

## 2022-03-01 ENCOUNTER — Telehealth: Payer: Self-pay | Admitting: Oncology

## 2022-03-01 NOTE — Telephone Encounter (Signed)
Called patient regarding upcoming appointment, left a voicemail. 

## 2022-03-14 ENCOUNTER — Inpatient Hospital Stay: Payer: BC Managed Care – PPO | Attending: Oncology

## 2022-03-14 ENCOUNTER — Other Ambulatory Visit: Payer: Self-pay

## 2022-03-14 ENCOUNTER — Inpatient Hospital Stay (HOSPITAL_BASED_OUTPATIENT_CLINIC_OR_DEPARTMENT_OTHER): Payer: BC Managed Care – PPO | Admitting: Oncology

## 2022-03-14 ENCOUNTER — Inpatient Hospital Stay: Payer: BC Managed Care – PPO

## 2022-03-14 VITALS — BP 119/85 | HR 67 | Temp 97.9°F | Resp 17 | Ht 66.0 in | Wt 245.5 lb

## 2022-03-14 DIAGNOSIS — Z79899 Other long term (current) drug therapy: Secondary | ICD-10-CM | POA: Diagnosis not present

## 2022-03-14 DIAGNOSIS — K754 Autoimmune hepatitis: Secondary | ICD-10-CM | POA: Insufficient documentation

## 2022-03-14 DIAGNOSIS — C641 Malignant neoplasm of right kidney, except renal pelvis: Secondary | ICD-10-CM | POA: Diagnosis not present

## 2022-03-14 DIAGNOSIS — Z880 Allergy status to penicillin: Secondary | ICD-10-CM | POA: Insufficient documentation

## 2022-03-14 DIAGNOSIS — Z5112 Encounter for antineoplastic immunotherapy: Secondary | ICD-10-CM | POA: Diagnosis not present

## 2022-03-14 DIAGNOSIS — E039 Hypothyroidism, unspecified: Secondary | ICD-10-CM | POA: Diagnosis not present

## 2022-03-14 DIAGNOSIS — C7800 Secondary malignant neoplasm of unspecified lung: Secondary | ICD-10-CM | POA: Diagnosis not present

## 2022-03-14 DIAGNOSIS — Z905 Acquired absence of kidney: Secondary | ICD-10-CM | POA: Insufficient documentation

## 2022-03-14 DIAGNOSIS — Z79890 Hormone replacement therapy (postmenopausal): Secondary | ICD-10-CM | POA: Insufficient documentation

## 2022-03-14 DIAGNOSIS — C649 Malignant neoplasm of unspecified kidney, except renal pelvis: Secondary | ICD-10-CM | POA: Diagnosis not present

## 2022-03-14 DIAGNOSIS — R7989 Other specified abnormal findings of blood chemistry: Secondary | ICD-10-CM | POA: Insufficient documentation

## 2022-03-14 DIAGNOSIS — R682 Dry mouth, unspecified: Secondary | ICD-10-CM | POA: Diagnosis not present

## 2022-03-14 LAB — CMP (CANCER CENTER ONLY)
ALT: 19 U/L (ref 0–44)
AST: 18 U/L (ref 15–41)
Albumin: 4.2 g/dL (ref 3.5–5.0)
Alkaline Phosphatase: 90 U/L (ref 38–126)
Anion gap: 7 (ref 5–15)
BUN: 18 mg/dL (ref 6–20)
CO2: 26 mmol/L (ref 22–32)
Calcium: 8.9 mg/dL (ref 8.9–10.3)
Chloride: 107 mmol/L (ref 98–111)
Creatinine: 1.31 mg/dL — ABNORMAL HIGH (ref 0.61–1.24)
GFR, Estimated: >60 mL/min (ref >60)
Glucose, Bld: 116 mg/dL — ABNORMAL HIGH (ref 70–99)
Potassium: 4.1 mmol/L (ref 3.5–5.1)
Sodium: 140 mmol/L (ref 135–145)
Total Bilirubin: 0.5 mg/dL (ref 0.3–1.2)
Total Protein: 7.6 g/dL (ref 6.5–8.1)

## 2022-03-14 LAB — CBC WITH DIFFERENTIAL (CANCER CENTER ONLY)
Abs Immature Granulocytes: 0.01 10*3/uL (ref 0.00–0.07)
Basophils Absolute: 0.1 10*3/uL (ref 0.0–0.1)
Basophils Relative: 1 %
Eosinophils Absolute: 1 10*3/uL — ABNORMAL HIGH (ref 0.0–0.5)
Eosinophils Relative: 13 %
HCT: 44.4 % (ref 39.0–52.0)
Hemoglobin: 15.4 g/dL (ref 13.0–17.0)
Immature Granulocytes: 0 %
Lymphocytes Relative: 26 %
Lymphs Abs: 1.9 10*3/uL (ref 0.7–4.0)
MCH: 30 pg (ref 26.0–34.0)
MCHC: 34.7 g/dL (ref 30.0–36.0)
MCV: 86.4 fL (ref 80.0–100.0)
Monocytes Absolute: 0.6 10*3/uL (ref 0.1–1.0)
Monocytes Relative: 8 %
Neutro Abs: 4 10*3/uL (ref 1.7–7.7)
Neutrophils Relative %: 52 %
Platelet Count: 225 10*3/uL (ref 150–400)
RBC: 5.14 MIL/uL (ref 4.22–5.81)
RDW: 13.2 % (ref 11.5–15.5)
WBC Count: 7.6 10*3/uL (ref 4.0–10.5)
nRBC: 0 % (ref 0.0–0.2)

## 2022-03-14 LAB — TSH: TSH: 12.683 u[IU]/mL — ABNORMAL HIGH (ref 0.350–4.500)

## 2022-03-14 MED ORDER — SODIUM CHLORIDE 0.9 % IV SOLN
400.0000 mg | Freq: Once | INTRAVENOUS | Status: AC
  Administered 2022-03-14: 400 mg via INTRAVENOUS
  Filled 2022-03-14: qty 16

## 2022-03-14 MED ORDER — SODIUM CHLORIDE 0.9 % IV SOLN: Freq: Once | INTRAVENOUS | Status: AC

## 2022-03-14 NOTE — Progress Notes (Signed)
Hematology and Oncology Follow Up Visit ? ?Michael [name] ?[id number] ?April 27, 1966 56 y.o. ?03/14/2022 9:38 AM ?[name], MDRusso, [name], MD  ? 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 ? ?Secondary diagnosis: Left kidney tumors noted in May 2022.  He was found to have 2.8 x 2.8 x 2.7 mm lesion measuring 2.1 x 2.0 x 1.6. ? ? ?Prior Therapy: ?He underwent a right nephrectomy on August 27, 2019 performed while he was living in [place].  The final pathology showed clear cell renal cell carcinoma, nuclear grade 3 with 10% necrosis negative margins.  Final pathological stage was T2ANX. ? ?He is status post Pembrolizumab and axitinib started in March 2021.  He received two cycles of therapy.  Therapy was interrupted because of increased LFTs and autoimmune hepatitis.   ? ?He has been receiving his therapy in [place] and relocated to this area. ? ?Axitinib 5 mg daily started on July 05, 2020.  Therapy discontinued on July 13, 2020 because of elevated LFTs. ? ?He is status post cryoablation of a left renal mass completed on May 30, 2021. ? ?Current therapy: Pembrolizumab 200 mg every 3 weeks started on July 27, 2021.  He was switched to 400 mg every 6 weeks started on November 09, 2021.  He is here for the next cycle of therapy. ? ?Interim History: Mr. Hermann is here for a follow-up visit.  Since last visit, he reports feeling well without any major complaints.  He has tolerated the current treatment without any issues.  He denies any nausea, vomiting or abdominal pain.  He denies any skin rash or diarrhea.  He continues to have dry mouth without any ulcers or lesions.  His energy and performance status remain excellent continues to be physically active including playing tennis. ? ? ? ?Medications: Updated on review. ?Current Outpatient Medications  ?Medication Sig Dispense Refill  ? amLODipine (NORVASC) 5 MG tablet Take 7.5  mg by mouth in the morning.    ? cholecalciferol (VITAMIN D3) 25 MCG (1000 UNIT) tablet Take 1,000 Units by mouth in the morning.    ? levothyroxine (SYNTHROID) 100 MCG tablet TAKE 1 TABLET(100 MCG) BY MOUTH DAILY BEFORE BREAKFAST 90 tablet 0  ? levothyroxine (SYNTHROID) 25 MCG tablet TAKE 1 TABLET(25 MCG) BY MOUTH DAILY BEFORE BREAKFAST 90 tablet 1  ? montelukast (SINGULAIR) 10 MG tablet Take 10 mg by mouth in the morning.    ? tadalafil (CIALIS) 20 MG tablet Take 10-20 mg by mouth daily as needed for erectile dysfunction.    ? ?No current facility-administered medications for this visit.  ? ? ? ?Allergies:  ?Allergies  ?Allergen Reactions  ? Penicillins Other (See Comments)  ?  Childhood allergies- unk  ? ? ? ? ?Physical Exam: ? ? ? ? ? ?Blood pressure 119/85, pulse 67, temperature 97.9 ?F (36.6 ?C), temperature source Temporal, resp. rate 17, height '5\' 6"'$  (1.676 m), weight 245 lb 8 oz (111.4 kg), SpO2 100 %. ? ? ? ? ? ? ? ? ? ?ECOG: 0 ? ? ? ? ?General appearance: Alert, awake without any distress. ?Head: Atraumatic without abnormalities ?Oropharynx: Without any thrush or ulcers. ?Eyes: No scleral icterus. ?Lymph nodes: No lymphadenopathy noted in the cervical, supraclavicular, or axillary nodes ?Heart:regular rate and rhythm, without any murmurs or gallops.   ?Lung: Clear to auscultation without any rhonchi, wheezes or dullness to percussion. ?Abdomin: Soft, nontender without any shifting dullness or ascites. ?Musculoskeletal: No clubbing or cyanosis. ?  Neurological: No motor or sensory deficits. ?Skin: No rashes or lesions. ? ? ? ? ? ? ? ? ? ? ? ? ? ? ? ? ? ? ?Lab Results: ?Lab Results  ?Component Value Date  ? WBC 7.3 01/30/2022  ? HGB 15.0 01/30/2022  ? HCT 43.9 01/30/2022  ? MCV 86.4 01/30/2022  ? PLT 243 01/30/2022  ? ?  Chemistry   ?   ?Component Value Date/Time  ? NA 140 01/30/2022 1007  ? K 4.0 01/30/2022 1007  ? CL 107 01/30/2022 1007  ? CO2 26 01/30/2022 1007  ? BUN 14 01/30/2022 1007  ? CREATININE 1.27  (H) 01/30/2022 1007  ?    ?Component Value Date/Time  ? CALCIUM 9.0 01/30/2022 1007  ? ALKPHOS 93 01/30/2022 1007  ? AST 19 01/30/2022 1007  ? ALT 18 01/30/2022 1007  ? BILITOT 0.6 01/30/2022 1007  ?  ? ? ? ? ? ? ? ?Impression and Plan: ? ?56 year old with: ?  ?1.  Stage IV clear-cell renal cell carcinoma with pulmonary involvement diagnosed 2021. ? ?He is currently on Pembrolizumab without any major complications.  Risks and benefits of continuing this treatment were reviewed.  Autoimmune complications as well as GI toxicity and dermatological issues were reiterated.  His CT scan in March 2023 showed positive response to therapy and I recommend updating his staging scan in June 2023.  He is agreeable to proceed. ? ?  ?2.  Hypothyroidism: He is currently on Synthroid replacement with TSH close to normal range. ?  ?3.    Left kidney masses: No evidence of progression of disease based on MRI imaging.  This will be updated in 6 months. ?  ?4.  Goals of care and prognosis: His disease remains incurable although aggressive measures are warranted given his excellent response and young age. ?  ?5.  Dry mouth: Supportive measures are recommended at this time.  No worsening findings noted. ?  ?6.  Follow-up: In 6 weeks for repeat follow-up. ?  ?30  minutes were dedicated to this visit.  The time was spent on reviewing laboratory data, disease status update and outlining future plan of care review. ? ? ? ?Zola Button, MD ?5/11/20239:38 AM  ? ? ?0 change since ?

## 2022-03-14 NOTE — Patient Instructions (Signed)
[facility]  Discharge Instructions: ?Thank you for choosing [facility] to provide your oncology and hematology care.  ? ?If you have a lab appointment with the Cancer Center, please go directly to the Cancer Center and check in at the registration area. ?  ?Wear comfortable clothing and clothing appropriate for easy access to any Portacath or PICC line.  ? ?We strive to give you quality time with your provider. You may need to reschedule your appointment if you arrive late (15 or more minutes).  Arriving late affects you and other patients whose appointments are after yours.  Also, if you miss three or more appointments without notifying the office, you may be dismissed from the clinic at the provider?s discretion.    ?  ?For prescription refill requests, have your pharmacy contact our office and allow 72 hours for refills to be completed.   ? ?Today you received the following chemotherapy and/or immunotherapy agent: Keytruda    ?  ?To help prevent nausea and vomiting after your treatment, we encourage you to take your nausea medication as directed. ? ?BELOW ARE SYMPTOMS THAT SHOULD BE REPORTED IMMEDIATELY: ?*FEVER GREATER THAN 100.4 F (38 ?C) OR HIGHER ?*CHILLS OR SWEATING ?*NAUSEA AND VOMITING THAT IS NOT CONTROLLED WITH YOUR NAUSEA MEDICATION ?*UNUSUAL SHORTNESS OF BREATH ?*UNUSUAL BRUISING OR BLEEDING ?*URINARY PROBLEMS (pain or burning when urinating, or frequent urination) ?*BOWEL PROBLEMS (unusual diarrhea, constipation, pain near the anus) ?TENDERNESS IN MOUTH AND THROAT WITH OR WITHOUT PRESENCE OF ULCERS (sore throat, sores in mouth, or a toothache) ?UNUSUAL RASH, SWELLING OR PAIN  ?UNUSUAL VAGINAL DISCHARGE OR ITCHING  ? ?Items with * indicate a potential emergency and should be followed up as soon as possible or go to the Emergency Department if any problems should occur. ? ?Please show the CHEMOTHERAPY ALERT CARD or IMMUNOTHERAPY ALERT CARD at check-in to  the Emergency Department and triage nurse. ? ?Should you have questions after your visit or need to cancel or reschedule your appointment, please contact [facility]  Dept: [phone]  and follow the prompts.  Office hours are 8:00 a.m. to 4:30 p.m. Monday - Friday. Please note that voicemails left after 4:00 p.m. may not be returned until the following business day.  We are closed weekends and major holidays. You have access to a nurse at all times for urgent questions. Please call the main number to the clinic Dept: [phone] and follow the prompts. ? ? ?For any non-urgent questions, you may also contact your provider using MyChart. We now offer e-Visits for anyone 18 and older to request care online for non-urgent symptoms. For details visit [email or web address]. ?  ?Also download the MyChart app! Go to the app store, search "MyChart", open the app, select [facility], and log in with your MyChart username and password. ? ?Due to Covid, a mask is required upon entering the hospital/clinic. If you do not have a mask, one will be given to you upon arrival. For doctor visits, patients may have 1 support person aged 18 or older with them. For treatment visits, patients cannot have anyone with them due to current Covid guidelines and our immunocompromised population.  ? ?

## 2022-04-09 ENCOUNTER — Telehealth: Payer: Self-pay | Admitting: *Deleted

## 2022-04-09 NOTE — Telephone Encounter (Signed)
PC to patient, no answer, left VM - informed patient his CT scan has been authorized by his insurance & can be scheduled at any time by [employer], [phone].  His scan needs to be scheduled before his appointment with Dr. Alen Blew on 05/01/22.  Also, he needs to pick up oral contrast for the CT at the front desk at [facility], instructions are included.  Patient advised to call this office with any questions/concerns, [phone].

## 2022-04-24 ENCOUNTER — Ambulatory Visit (HOSPITAL_COMMUNITY)
Admission: RE | Admit: 2022-04-24 | Discharge: 2022-04-24 | Disposition: A | Discharge status: Home / Self Care | Payer: BC Managed Care – PPO | Source: Ambulatory Visit | Attending: Oncology | Admitting: Oncology

## 2022-04-24 ENCOUNTER — Encounter (HOSPITAL_COMMUNITY): Payer: Self-pay

## 2022-04-24 DIAGNOSIS — C649 Malignant neoplasm of unspecified kidney, except renal pelvis: Secondary | ICD-10-CM

## 2022-04-24 DIAGNOSIS — R911 Solitary pulmonary nodule: Secondary | ICD-10-CM | POA: Diagnosis not present

## 2022-04-24 IMAGING — CT CT ABD-PELV W/O CM
2 of 4 series · 13 of 36 positions shown, 16 images · non-contrast
Comparison: Prior CTs [DATE] and [DATE]. Abdominal MRI
[DATE].

CLINICAL DATA: Kidney cancer, recurrence. History of metastatic
renal cell carcinoma status post right nephrectomy and left cryo
ablation. Surveillance. * Tracking Code: BO *



[Series 2: cap w/o · axial · non-contrast · 0.98mm/px · z∈[-606,-62]mm · 10 of 131 slices shown, 13 images]
[im 11/131  mediastinal]
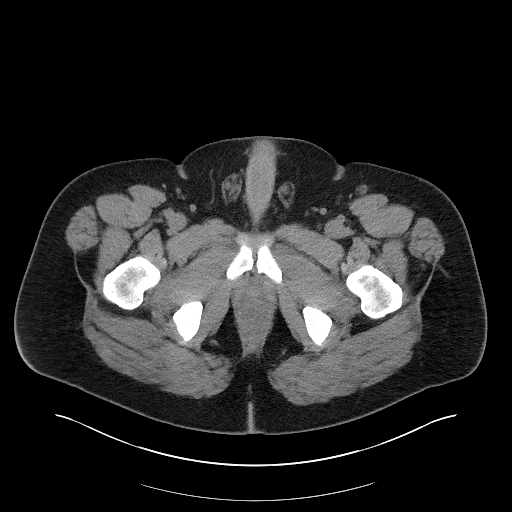
[im 11/131  lung]
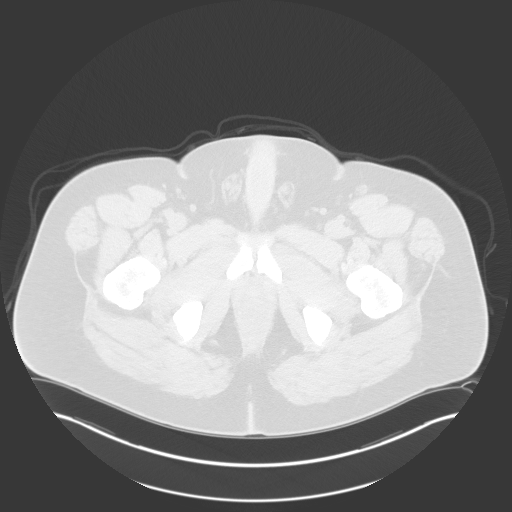
[im 22/131  lung]
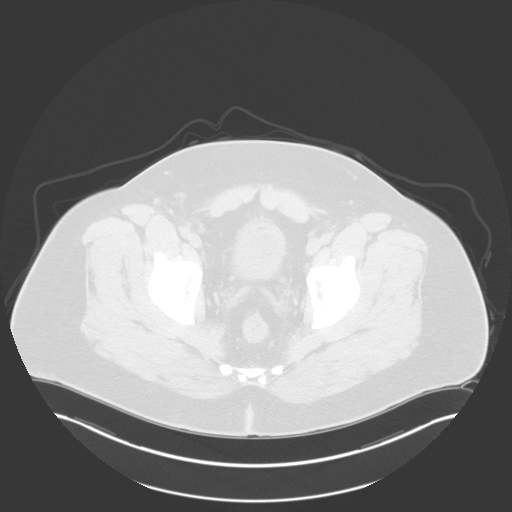
[im 33/131  lung]
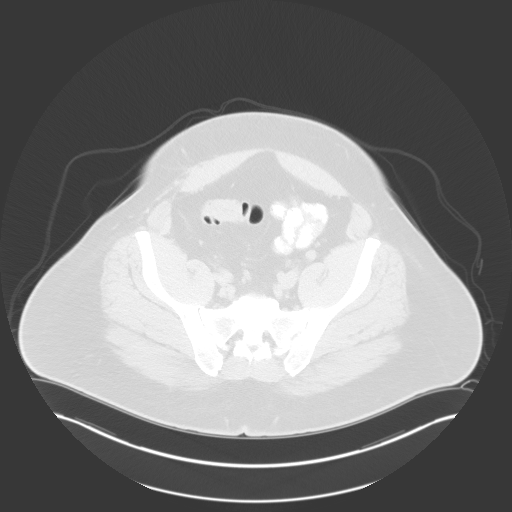
[im 44/131  lung]
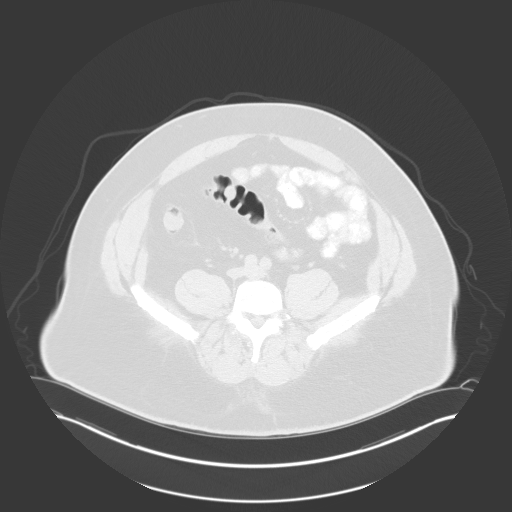
[im 55/131  mediastinal]
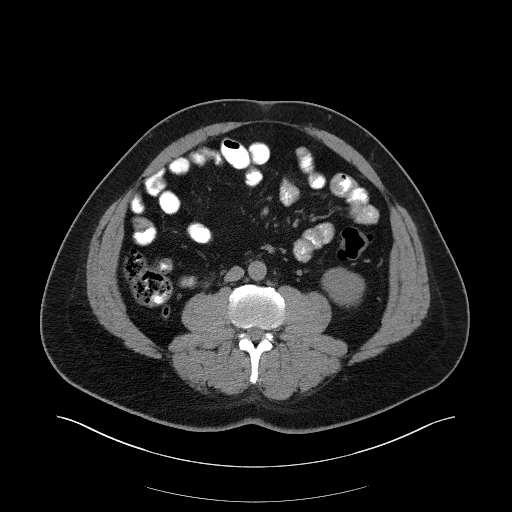
[im 55/131  lung]
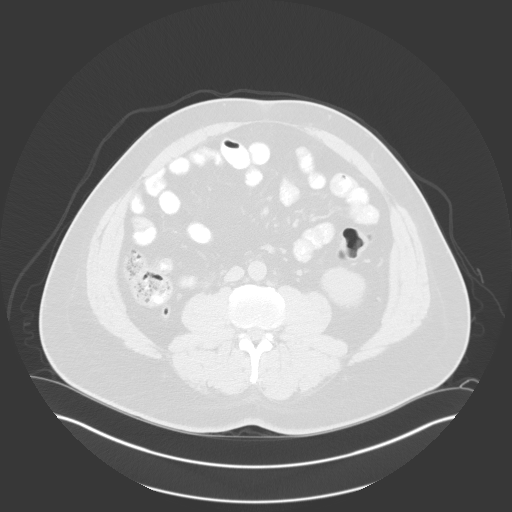
[im 76/131  lung]
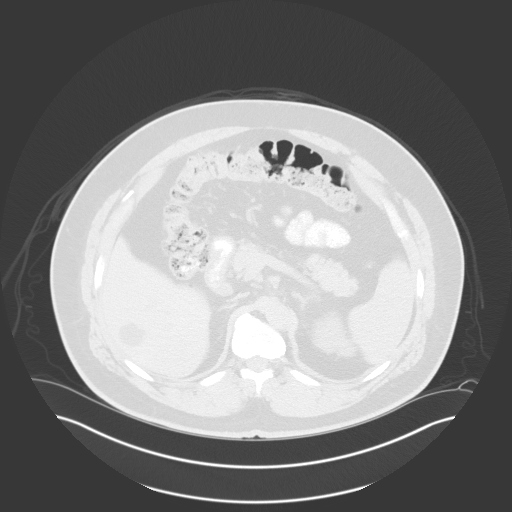
[im 87/131  lung]
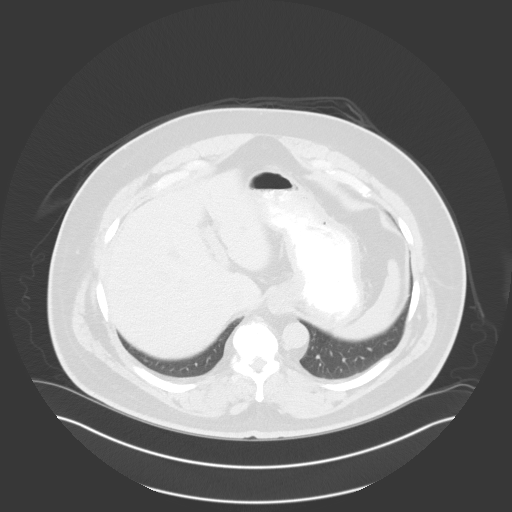
[im 98/131  lung]
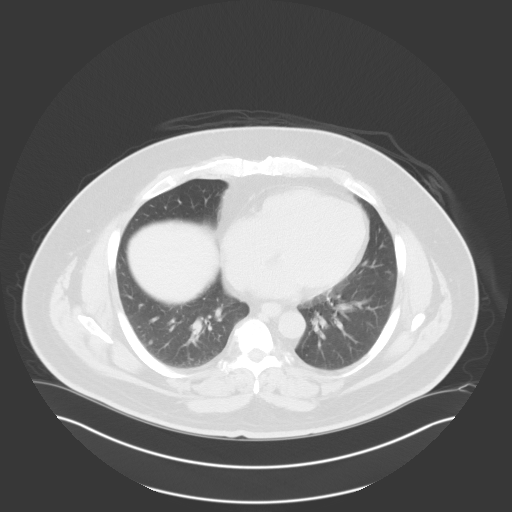
[im 109/131  mediastinal]
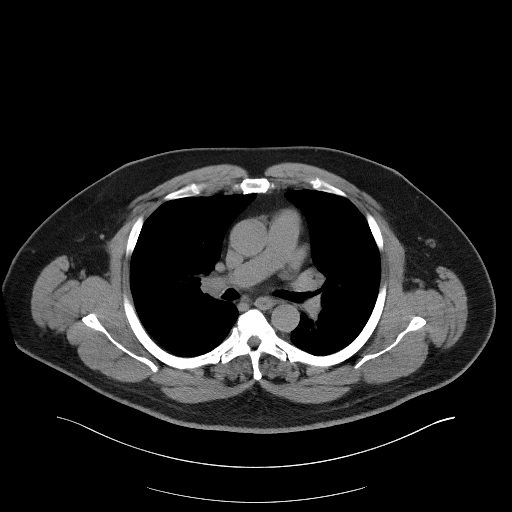
[im 109/131  lung]
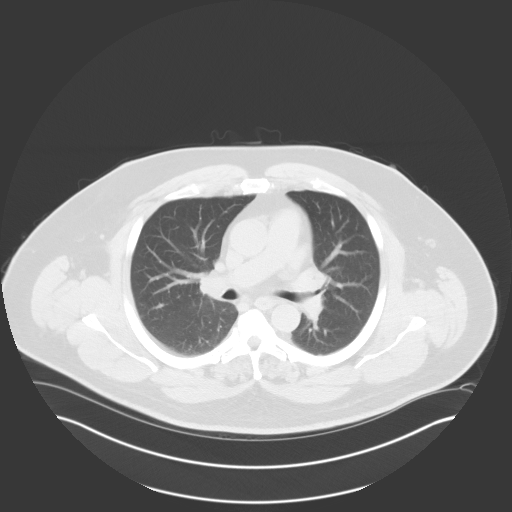
[im 120/131  lung]
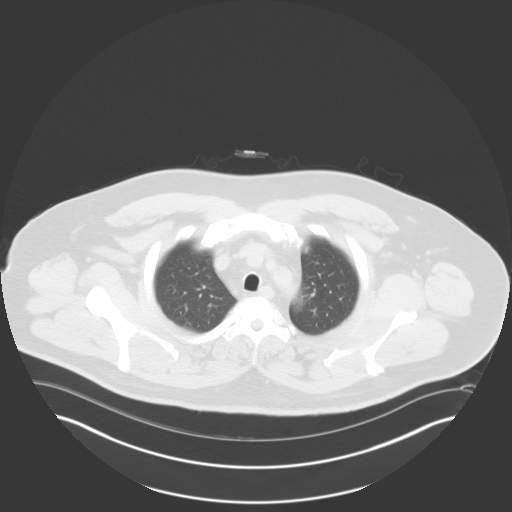

[Series 4: coronals · coronal · 0.86mm/px · 3 of 172 slices shown]
[im 35/172  lung]
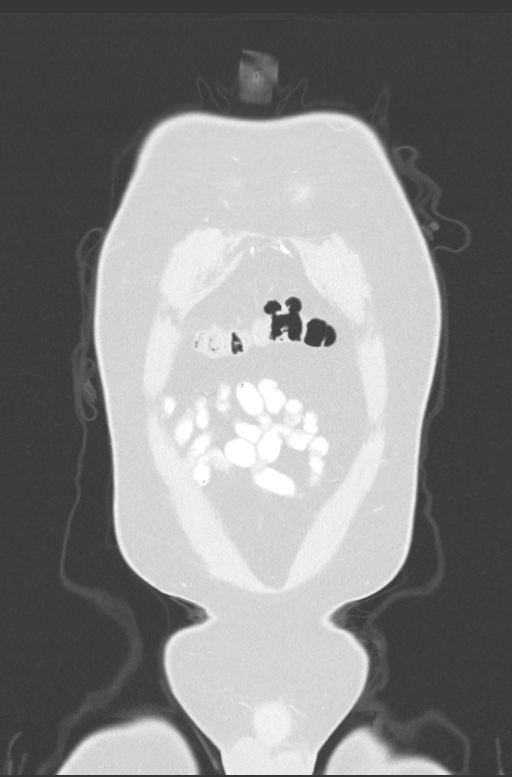
[im 69/172  lung]
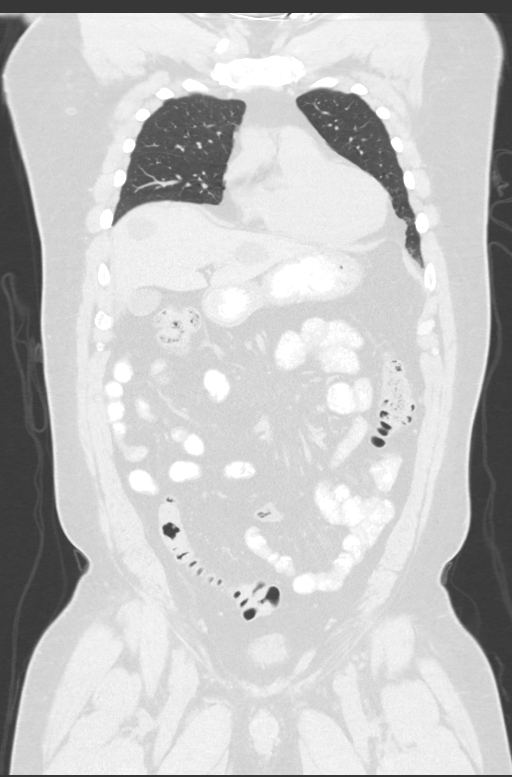
[im 103/172  lung]
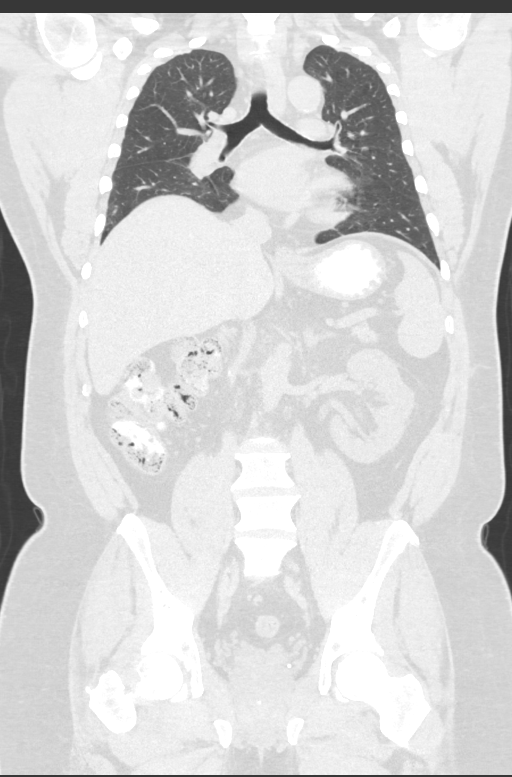

[13 of 36 positions shown; findings below may reference images not displayed]

FINDINGS: CT CHEST FINDINGS

Cardiovascular: Minimal aortic atherosclerosis. No acute vascular
findings on noncontrast imaging. The heart size is normal. There is
no pericardial effusion.

Mediastinum/Nodes: There are no enlarged mediastinal, hilar or
axillary lymph nodes.Hilar assessment is limited by the lack of
intravenous contrast, although the hilar contours appear unchanged.
The thyroid gland, trachea and esophagus demonstrate no significant
findings.

Lungs/Pleura: No pleural effusion or pneumothorax. Stable 5 mm right
lower lobe nodule on image 84/6. No new or enlarging pulmonary
nodules.

Musculoskeletal/Chest wall: No chest wall mass or suspicious osseous
findings.

CT ABDOMEN AND PELVIS FINDINGS

Hepatobiliary: The liver is normal in density without suspicious
focal abnormality. Scattered hepatic cysts are unchanged. No
evidence of gallstones, gallbladder wall thickening or biliary
dilatation.

Pancreas: Unremarkable. No pancreatic ductal dilatation or
surrounding inflammatory changes.

Spleen: Normal in size without focal abnormality.

Adrenals/Urinary Tract: Both adrenal glands appear normal. Previous
right nephrectomy without mass lesion in the nephrectomy bed. Stable
cryoablation changes laterally in the interpolar region of the left
kidney. No signs of local recurrence of tumor on noncontrast
imaging. No evidence of ureteral calculus or hydronephrosis. The
bladder appears normal for its degree of distention.

Stomach/Bowel: Enteric contrast was administered and has passed into
the proximal colon. The stomach appears unremarkable for its degree
of distension. No evidence of bowel wall thickening, distention or
surrounding inflammatory change. The appendix appears normal.

Vascular/Lymphatic: There are no enlarged abdominal or pelvic lymph
nodes. Scattered small retroperitoneal lymph nodes are unchanged. No
significant vascular findings on noncontrast imaging.

Reproductive: The prostate gland and seminal vesicles appear
unchanged.

Other: No evidence of abdominal wall mass or hernia. No ascites.

Musculoskeletal: No acute or significant osseous findings. Mild
spondylosis.
IMPRESSION: 1. Stable noncontrast CTs of the chest, abdomen and pelvis.
2. The left kidney has a stable appearance without evidence of local
recurrence on noncontrast imaging.
3. Stable residual small right lower lobe pulmonary nodule. No
evidence of progressive metastatic disease post right nephrectomy.

## 2022-04-24 IMAGING — CT CT CHEST W/O CM
2 of 4 series · 13 of 36 positions shown, 16 images · non-contrast
Comparison: Prior CTs [DATE] and [DATE]. Abdominal MRI
[DATE].

CLINICAL DATA: Kidney cancer, recurrence. History of metastatic
renal cell carcinoma status post right nephrectomy and left cryo
ablation. Surveillance. * Tracking Code: BO *



[Series 2: cap w/o · axial · non-contrast · 0.98mm/px · z∈[-606,-62]mm · 10 of 131 slices shown, 13 images]
[im 11/131  mediastinal]
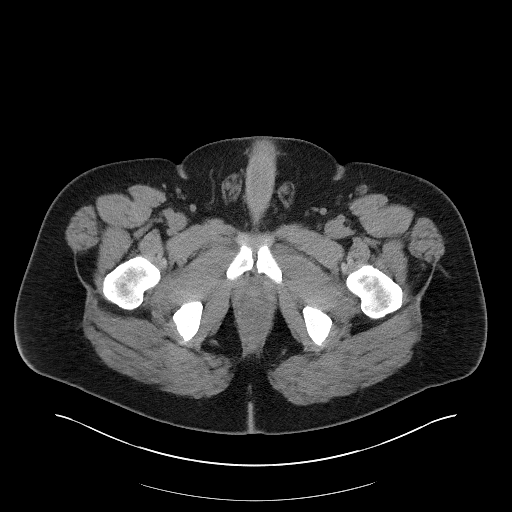
[im 11/131  lung]
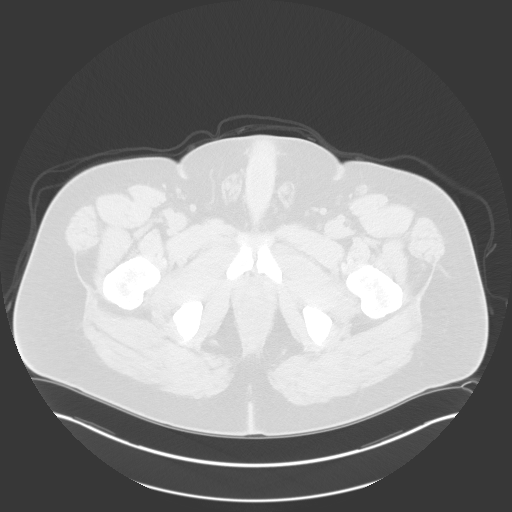
[im 22/131  lung]
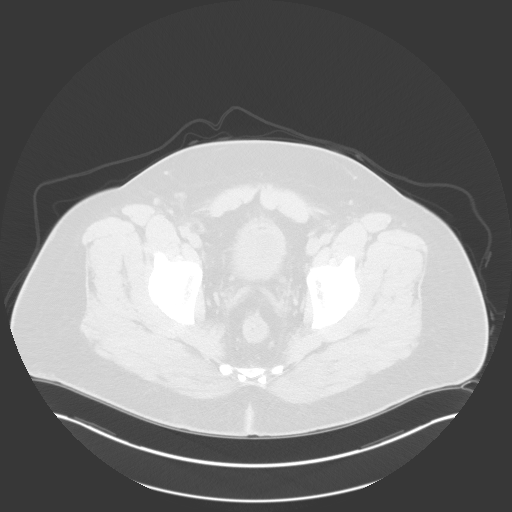
[im 33/131  lung]
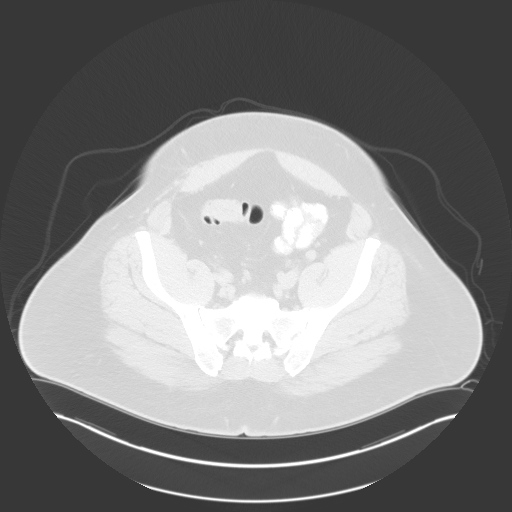
[im 44/131  lung]
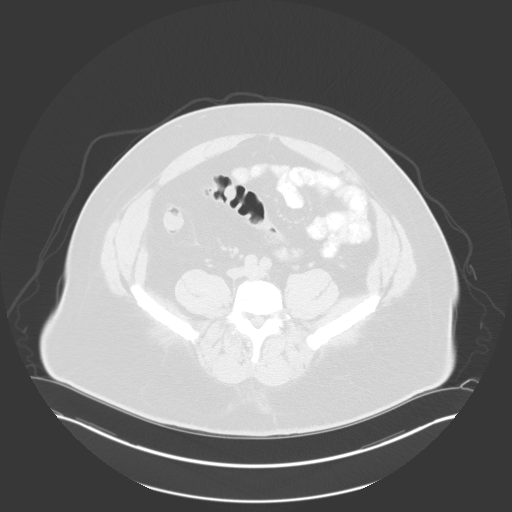
[im 55/131  mediastinal]
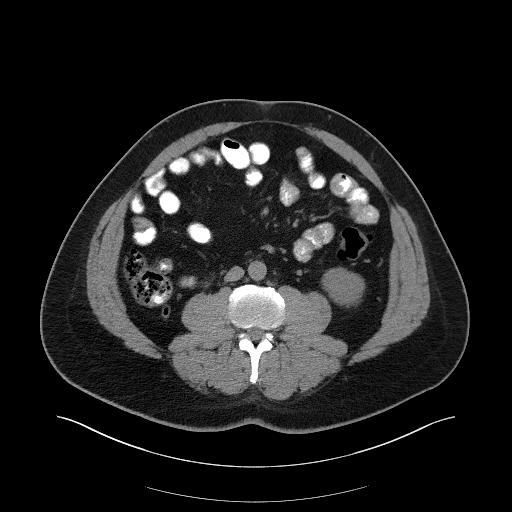
[im 55/131  lung]
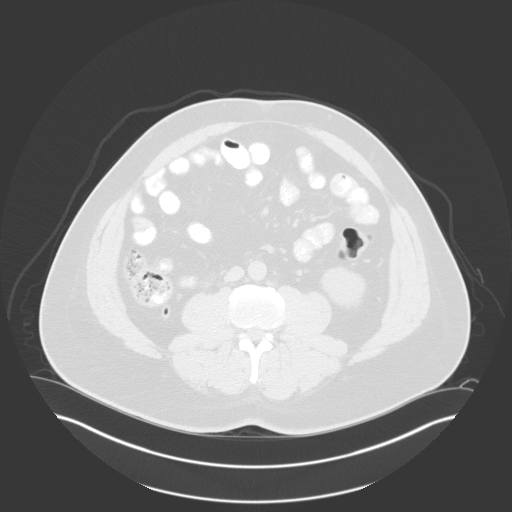
[im 76/131  lung]
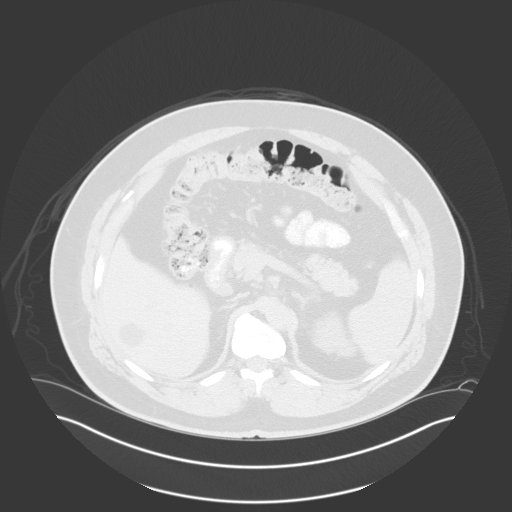
[im 87/131  lung]
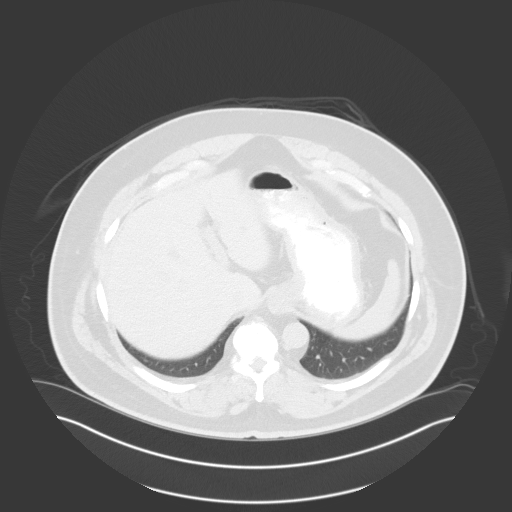
[im 98/131  lung]
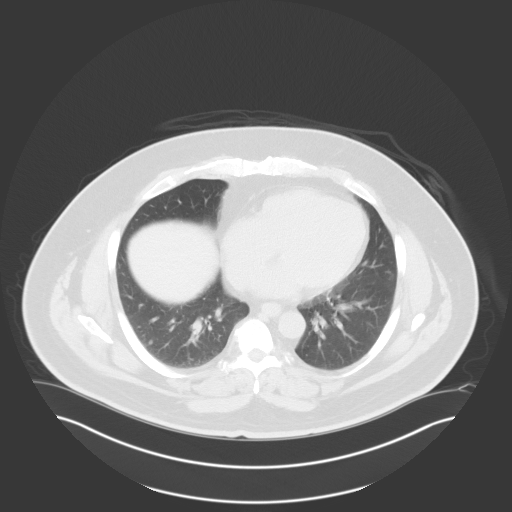
[im 109/131  mediastinal]
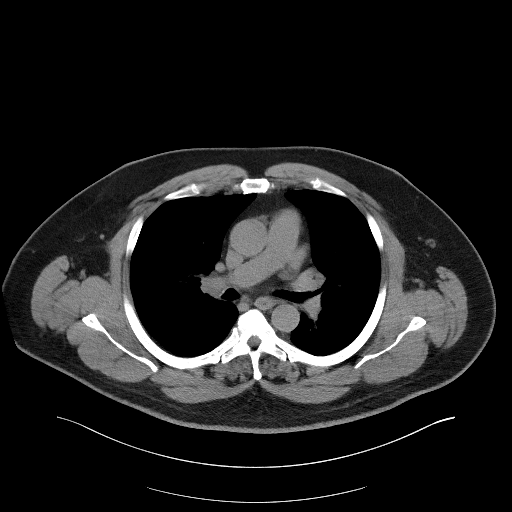
[im 109/131  lung]
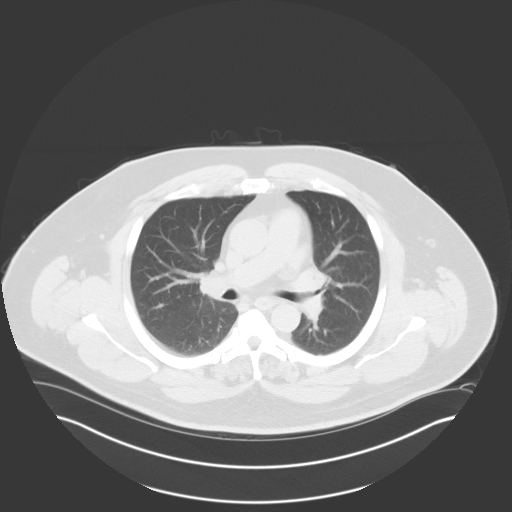
[im 120/131  lung]
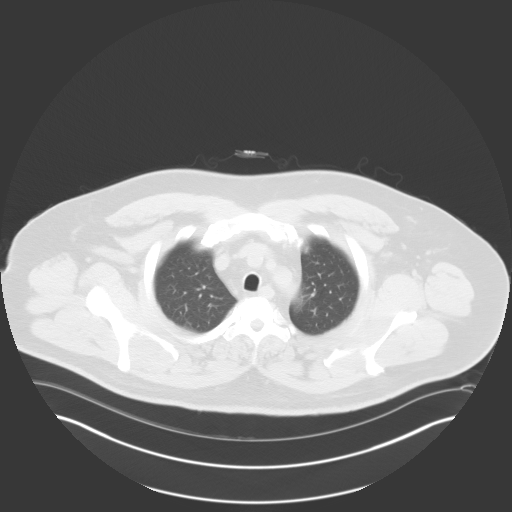

[Series 4: coronals · coronal · 0.86mm/px · 3 of 172 slices shown]
[im 35/172  lung]
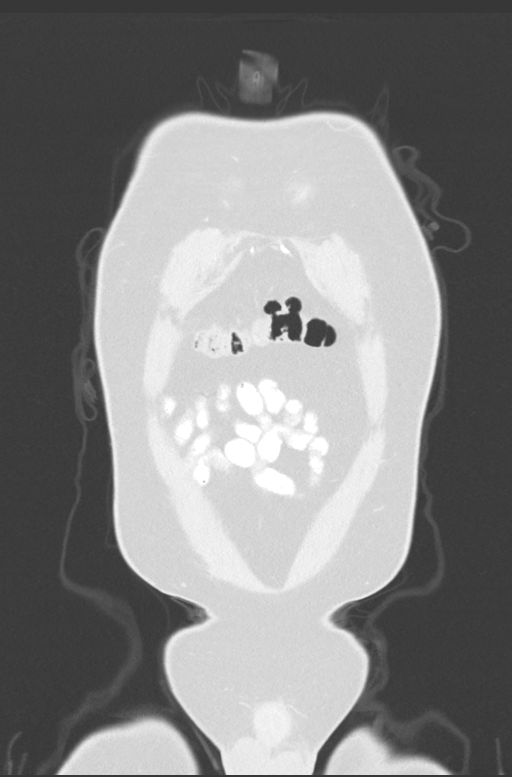
[im 69/172  lung]
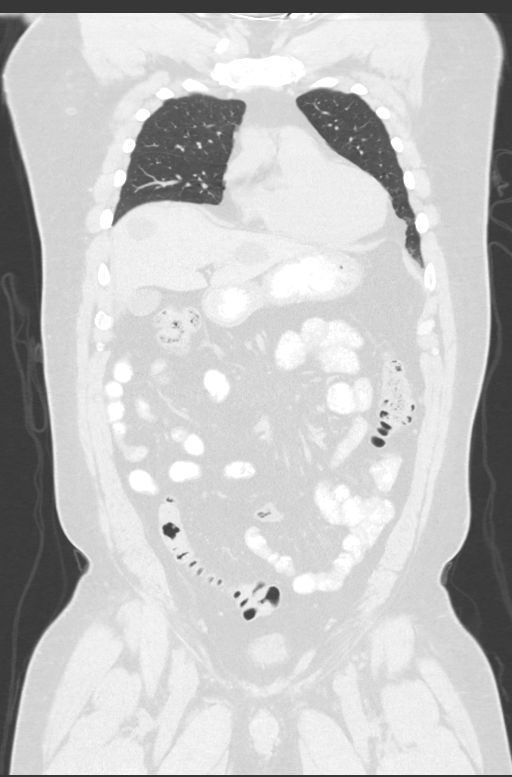
[im 103/172  lung]
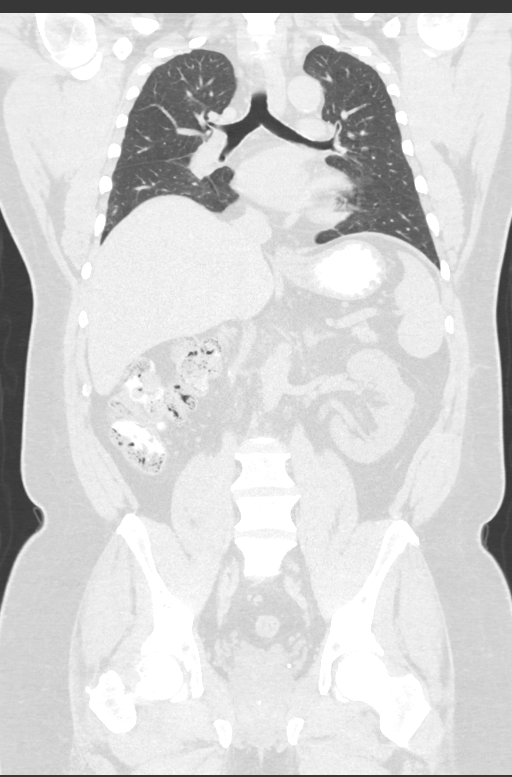

[13 of 36 positions shown; findings below may reference images not displayed]

FINDINGS: CT CHEST FINDINGS

Cardiovascular: Minimal aortic atherosclerosis. No acute vascular
findings on noncontrast imaging. The heart size is normal. There is
no pericardial effusion.

Mediastinum/Nodes: There are no enlarged mediastinal, hilar or
axillary lymph nodes.Hilar assessment is limited by the lack of
intravenous contrast, although the hilar contours appear unchanged.
The thyroid gland, trachea and esophagus demonstrate no significant
findings.

Lungs/Pleura: No pleural effusion or pneumothorax. Stable 5 mm right
lower lobe nodule on image 84/6. No new or enlarging pulmonary
nodules.

Musculoskeletal/Chest wall: No chest wall mass or suspicious osseous
findings.

CT ABDOMEN AND PELVIS FINDINGS

Hepatobiliary: The liver is normal in density without suspicious
focal abnormality. Scattered hepatic cysts are unchanged. No
evidence of gallstones, gallbladder wall thickening or biliary
dilatation.

Pancreas: Unremarkable. No pancreatic ductal dilatation or
surrounding inflammatory changes.

Spleen: Normal in size without focal abnormality.

Adrenals/Urinary Tract: Both adrenal glands appear normal. Previous
right nephrectomy without mass lesion in the nephrectomy bed. Stable
cryoablation changes laterally in the interpolar region of the left
kidney. No signs of local recurrence of tumor on noncontrast
imaging. No evidence of ureteral calculus or hydronephrosis. The
bladder appears normal for its degree of distention.

Stomach/Bowel: Enteric contrast was administered and has passed into
the proximal colon. The stomach appears unremarkable for its degree
of distension. No evidence of bowel wall thickening, distention or
surrounding inflammatory change. The appendix appears normal.

Vascular/Lymphatic: There are no enlarged abdominal or pelvic lymph
nodes. Scattered small retroperitoneal lymph nodes are unchanged. No
significant vascular findings on noncontrast imaging.

Reproductive: The prostate gland and seminal vesicles appear
unchanged.

Other: No evidence of abdominal wall mass or hernia. No ascites.

Musculoskeletal: No acute or significant osseous findings. Mild
spondylosis.
IMPRESSION: 1. Stable noncontrast CTs of the chest, abdomen and pelvis.
2. The left kidney has a stable appearance without evidence of local
recurrence on noncontrast imaging.
3. Stable residual small right lower lobe pulmonary nodule. No
evidence of progressive metastatic disease post right nephrectomy.

## 2022-05-01 ENCOUNTER — Inpatient Hospital Stay: Payer: BC Managed Care – PPO | Attending: Oncology

## 2022-05-01 ENCOUNTER — Other Ambulatory Visit: Payer: Self-pay

## 2022-05-01 ENCOUNTER — Inpatient Hospital Stay: Payer: BC Managed Care – PPO

## 2022-05-01 ENCOUNTER — Inpatient Hospital Stay (HOSPITAL_BASED_OUTPATIENT_CLINIC_OR_DEPARTMENT_OTHER): Payer: BC Managed Care – PPO | Admitting: Oncology

## 2022-05-01 VITALS — BP 127/87 | HR 61 | Temp 98.7°F | Resp 18 | Ht 66.0 in | Wt 244.0 lb

## 2022-05-01 DIAGNOSIS — C641 Malignant neoplasm of right kidney, except renal pelvis: Secondary | ICD-10-CM | POA: Insufficient documentation

## 2022-05-01 DIAGNOSIS — Z79890 Hormone replacement therapy (postmenopausal): Secondary | ICD-10-CM | POA: Diagnosis not present

## 2022-05-01 DIAGNOSIS — C7800 Secondary malignant neoplasm of unspecified lung: Secondary | ICD-10-CM | POA: Insufficient documentation

## 2022-05-01 DIAGNOSIS — E039 Hypothyroidism, unspecified: Secondary | ICD-10-CM | POA: Insufficient documentation

## 2022-05-01 DIAGNOSIS — Z79899 Other long term (current) drug therapy: Secondary | ICD-10-CM | POA: Diagnosis not present

## 2022-05-01 DIAGNOSIS — Z905 Acquired absence of kidney: Secondary | ICD-10-CM | POA: Insufficient documentation

## 2022-05-01 DIAGNOSIS — K754 Autoimmune hepatitis: Secondary | ICD-10-CM | POA: Diagnosis not present

## 2022-05-01 DIAGNOSIS — R7989 Other specified abnormal findings of blood chemistry: Secondary | ICD-10-CM | POA: Insufficient documentation

## 2022-05-01 DIAGNOSIS — C649 Malignant neoplasm of unspecified kidney, except renal pelvis: Secondary | ICD-10-CM

## 2022-05-01 DIAGNOSIS — Z5112 Encounter for antineoplastic immunotherapy: Secondary | ICD-10-CM | POA: Insufficient documentation

## 2022-05-01 DIAGNOSIS — Z880 Allergy status to penicillin: Secondary | ICD-10-CM | POA: Insufficient documentation

## 2022-05-01 LAB — CMP (CANCER CENTER ONLY)
ALT: 23 U/L (ref 0–44)
AST: 22 U/L (ref 15–41)
Albumin: 4.4 g/dL (ref 3.5–5.0)
Alkaline Phosphatase: 94 U/L (ref 38–126)
Anion gap: 7 (ref 5–15)
BUN: 16 mg/dL (ref 6–20)
CO2: 26 mmol/L (ref 22–32)
Calcium: 9.2 mg/dL (ref 8.9–10.3)
Chloride: 105 mmol/L (ref 98–111)
Creatinine: 1.21 mg/dL (ref 0.61–1.24)
GFR, Estimated: >60 mL/min (ref >60)
Glucose, Bld: 97 mg/dL (ref 70–99)
Potassium: 4.1 mmol/L (ref 3.5–5.1)
Sodium: 138 mmol/L (ref 135–145)
Total Bilirubin: 0.7 mg/dL (ref 0.3–1.2)
Total Protein: 7.4 g/dL (ref 6.5–8.1)

## 2022-05-01 LAB — CBC WITH DIFFERENTIAL (CANCER CENTER ONLY)
Abs Immature Granulocytes: 0.01 10*3/uL (ref 0.00–0.07)
Basophils Absolute: 0 10*3/uL (ref 0.0–0.1)
Basophils Relative: 1 %
Eosinophils Absolute: 0.3 10*3/uL (ref 0.0–0.5)
Eosinophils Relative: 4 %
HCT: 44.4 % (ref 39.0–52.0)
Hemoglobin: 15.5 g/dL (ref 13.0–17.0)
Immature Granulocytes: 0 %
Lymphocytes Relative: 30 %
Lymphs Abs: 2 10*3/uL (ref 0.7–4.0)
MCH: 29.9 pg (ref 26.0–34.0)
MCHC: 34.9 g/dL (ref 30.0–36.0)
MCV: 85.5 fL (ref 80.0–100.0)
Monocytes Absolute: 0.5 10*3/uL (ref 0.1–1.0)
Monocytes Relative: 8 %
Neutro Abs: 3.8 10*3/uL (ref 1.7–7.7)
Neutrophils Relative %: 57 %
Platelet Count: 279 10*3/uL (ref 150–400)
RBC: 5.19 MIL/uL (ref 4.22–5.81)
RDW: 13.1 % (ref 11.5–15.5)
WBC Count: 6.6 10*3/uL (ref 4.0–10.5)
nRBC: 0 % (ref 0.0–0.2)

## 2022-05-01 LAB — TSH: TSH: 4.863 u[IU]/mL — ABNORMAL HIGH (ref 0.350–4.500)

## 2022-05-01 MED ORDER — SODIUM CHLORIDE 0.9 % IV SOLN
400.0000 mg | Freq: Once | INTRAVENOUS | Status: AC
  Administered 2022-05-01: 400 mg via INTRAVENOUS
  Filled 2022-05-01: qty 16

## 2022-05-01 MED ORDER — SODIUM CHLORIDE 0.9 % IV SOLN: Freq: Once | INTRAVENOUS | Status: AC

## 2022-05-01 NOTE — Progress Notes (Signed)
Hematology and Oncology Follow Up Visit  Michael [name] [id number] 05-09-1966 56 y.o. 05/01/2022 11:43 AM [name], [facility]usso, Michael Reichmann, MD   Principle Diagnosis: 56 year old man with kidney cancer diagnosed in October 2020.  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  Secondary diagnosis: Left kidney tumors noted in May 2022.  He was found to have 2.8 x 2.8 x 2.7 mm lesion measuring 2.1 x 2.0 x 1.6.   Prior Therapy: He underwent a right nephrectomy on August 27, 2019 performed while he was living in [place].  The final pathology showed clear cell renal cell carcinoma, nuclear grade 3 with 10% necrosis negative margins.  Final pathological stage was T2ANX.  He is status post Pembrolizumab and axitinib started in March 2021.  He received two cycles of therapy.  Therapy was interrupted because of increased LFTs and autoimmune hepatitis.    He has been receiving his therapy in [place] and relocated to this area.  Axitinib 5 mg daily started on July 05, 2020.  Therapy discontinued on July 13, 2020 because of elevated LFTs.  He is status post cryoablation of a left renal mass completed on May 30, 2021.  Current therapy: Pembrolizumab 200 mg every 3 weeks started on July 27, 2021.  He was switched to 400 mg every 6 weeks started on November 09, 2021.  He is here for the next cycle of therapy.  Interim History: Michael [name] returns today for repeat evaluation.  Since last visit, he reports no major changes in his health.  He continues to be active and attends to activities of daily living.  He denies any hospitalizations or illnesses.  He denies any complications related to Pembrolizumab.  He has reported dry mouth which has not changed dramatically at this time.  He denies any skin rashes or lesions.  He denies any arthralgias or myalgias.    Medications: Updated on review. Current Outpatient Medications  Medication Sig Dispense Refill    amLODipine (NORVASC) 5 MG tablet Take 7.5 mg by mouth in the morning.     cholecalciferol (VITAMIN D3) 25 MCG (1000 UNIT) tablet Take 1,000 Units by mouth in the morning.     levothyroxine (SYNTHROID) 100 MCG tablet TAKE 1 TABLET(100 MCG) BY MOUTH DAILY BEFORE BREAKFAST 90 tablet 0   levothyroxine (SYNTHROID) 25 MCG tablet TAKE 1 TABLET(25 MCG) BY MOUTH DAILY BEFORE BREAKFAST 90 tablet 1   montelukast (SINGULAIR) 10 MG tablet Take 10 mg by mouth in the morning.     tadalafil (CIALIS) 20 MG tablet Take 10-20 mg by mouth daily as needed for erectile dysfunction.     No current facility-administered medications for this visit.     Allergies:  Allergies  Allergen Reactions   Penicillins Other (See Comments)    Childhood allergies- unk      Physical Exam:          Blood pressure 127/87, pulse 61, temperature 98.7 F (37.1 C), temperature source Temporal, resp. rate 18, height '5\' 6"'$  (1.676 m), weight 244 lb (110.7 kg), SpO2 99 %.       ECOG: 0    General appearance: Comfortable appearing without any discomfort Head: Normocephalic without any trauma Oropharynx: Mucous membranes are moist and pink without any thrush or ulcers. Eyes: Pupils are equal and round reactive to light. Lymph nodes: No cervical, supraclavicular, inguinal or axillary lymphadenopathy.   Heart:regular rate and rhythm.  S1 and S2 without leg edema. Lung: Clear without any rhonchi or wheezes.  No dullness to percussion. Abdomin: Soft, nontender, nondistended with good bowel sounds.  No hepatosplenomegaly. Musculoskeletal: No joint deformity or effusion.  Full range of motion noted. Neurological: No deficits noted on motor, sensory and deep tendon reflex exam. Skin: No petechial rash or dryness.  Appeared moist.                    Lab Results: Lab Results  Component Value Date   WBC 7.6 03/14/2022   HGB 15.4 03/14/2022   HCT 44.4 03/14/2022   MCV 86.4 03/14/2022   PLT 225  03/14/2022     Chemistry      Component Value Date/Time   NA 140 03/14/2022 0936   K 4.1 03/14/2022 0936   CL 107 03/14/2022 0936   CO2 26 03/14/2022 0936   BUN 18 03/14/2022 0936   CREATININE 1.31 (H) 03/14/2022 0936      Component Value Date/Time   CALCIUM 8.9 03/14/2022 0936   ALKPHOS 90 03/14/2022 0936   AST 18 03/14/2022 0936   ALT 19 03/14/2022 0936   BILITOT 0.5 03/14/2022 0936      1. Stable noncontrast CTs of the chest, abdomen and pelvis. 2. The left kidney has a stable appearance without evidence of local recurrence on noncontrast imaging. 3. Stable residual small right lower lobe pulmonary nodule. No evidence of progressive metastatic disease post right nephrectomy.     Impression and Plan:  56 year old with:   1.  Kidney cancer diagnosed in 2021.  He was found to have stage IV clear-cell with lung involvement.  His disease status is updated at this time and treatment choices were reviewed.  CT scan on April 24, 2022 was personally reviewed and showed no evidence of disease progression.  He has very little pulmonary involvement at this time and no areas of metastasis noted.  Risks and benefits of continuing Pembrolizumab were discussed at this time.  Alternative treatment options including the addition of oral targeted therapy in the form of cabozantinib or lenvatinib among other choices.  These options will be deferred unless he has progression of disease.  He is agreeable to proceed.    2.  Hypothyroidism: His most recent TSH was up and will adjust his thyroid dosing pending his upcoming evaluation.   3.    Left kidney masses: MRI obtained in March 2023 showed no disease progression.  This will be repeated in September.   4.  Goals of care and prognosis:    5.  Dry mouth: Related to immunotherapy.  We have discussed supportive strategies including increasing hydration and dental hygiene to prevent caries.   6.  Follow-up: He will return in 6 weeks for a  follow-up visit.   30  minutes were spent on this encounter.  The time was dedicated to reviewing laboratory data, imaging studies review, discussing treatment choices for the future and future plan of care discussion.    Zola Button, MD 6/28/202311:43 AM    0 change since

## 2022-05-06 ENCOUNTER — Other Ambulatory Visit: Payer: Self-pay | Admitting: Oncology

## 2022-05-10 ENCOUNTER — Telehealth: Payer: Self-pay | Admitting: Oncology

## 2022-05-10 NOTE — Telephone Encounter (Signed)
Scheduled per 06/28 los, patient has been called and notified.

## 2022-05-11 DIAGNOSIS — H1032 Unspecified acute conjunctivitis, left eye: Secondary | ICD-10-CM | POA: Diagnosis not present

## 2022-05-27 ENCOUNTER — Other Ambulatory Visit: Payer: Self-pay

## 2022-05-27 DIAGNOSIS — Z125 Encounter for screening for malignant neoplasm of prostate: Secondary | ICD-10-CM | POA: Diagnosis not present

## 2022-05-27 DIAGNOSIS — E559 Vitamin D deficiency, unspecified: Secondary | ICD-10-CM | POA: Diagnosis not present

## 2022-05-27 DIAGNOSIS — E785 Hyperlipidemia, unspecified: Secondary | ICD-10-CM | POA: Diagnosis not present

## 2022-05-27 DIAGNOSIS — E039 Hypothyroidism, unspecified: Secondary | ICD-10-CM | POA: Diagnosis not present

## 2022-05-29 ENCOUNTER — Other Ambulatory Visit: Payer: Self-pay

## 2022-06-12 ENCOUNTER — Ambulatory Visit: Payer: BC Managed Care – PPO

## 2022-06-12 ENCOUNTER — Ambulatory Visit: Payer: BC Managed Care – PPO | Admitting: Oncology

## 2022-06-12 ENCOUNTER — Other Ambulatory Visit: Payer: BC Managed Care – PPO

## 2022-06-13 ENCOUNTER — Other Ambulatory Visit: Payer: Self-pay | Admitting: Oncology

## 2022-06-13 DIAGNOSIS — C649 Malignant neoplasm of unspecified kidney, except renal pelvis: Secondary | ICD-10-CM

## 2022-06-16 ENCOUNTER — Other Ambulatory Visit: Payer: Self-pay | Admitting: Oncology

## 2022-06-18 ENCOUNTER — Inpatient Hospital Stay: Payer: BC Managed Care – PPO

## 2022-06-18 ENCOUNTER — Inpatient Hospital Stay: Payer: BC Managed Care – PPO | Attending: Oncology

## 2022-06-18 ENCOUNTER — Inpatient Hospital Stay (HOSPITAL_BASED_OUTPATIENT_CLINIC_OR_DEPARTMENT_OTHER): Payer: BC Managed Care – PPO | Admitting: Oncology

## 2022-06-18 ENCOUNTER — Other Ambulatory Visit: Payer: Self-pay

## 2022-06-18 VITALS — BP 126/92 | HR 62 | Temp 97.7°F | Resp 20 | Wt 248.1 lb

## 2022-06-18 DIAGNOSIS — R682 Dry mouth, unspecified: Secondary | ICD-10-CM | POA: Insufficient documentation

## 2022-06-18 DIAGNOSIS — Z905 Acquired absence of kidney: Secondary | ICD-10-CM | POA: Diagnosis not present

## 2022-06-18 DIAGNOSIS — Z880 Allergy status to penicillin: Secondary | ICD-10-CM | POA: Diagnosis not present

## 2022-06-18 DIAGNOSIS — R7989 Other specified abnormal findings of blood chemistry: Secondary | ICD-10-CM | POA: Diagnosis not present

## 2022-06-18 DIAGNOSIS — Z79899 Other long term (current) drug therapy: Secondary | ICD-10-CM | POA: Diagnosis not present

## 2022-06-18 DIAGNOSIS — C649 Malignant neoplasm of unspecified kidney, except renal pelvis: Secondary | ICD-10-CM | POA: Diagnosis not present

## 2022-06-18 DIAGNOSIS — C7800 Secondary malignant neoplasm of unspecified lung: Secondary | ICD-10-CM | POA: Insufficient documentation

## 2022-06-18 DIAGNOSIS — Z79890 Hormone replacement therapy (postmenopausal): Secondary | ICD-10-CM | POA: Insufficient documentation

## 2022-06-18 DIAGNOSIS — C642 Malignant neoplasm of left kidney, except renal pelvis: Secondary | ICD-10-CM | POA: Diagnosis not present

## 2022-06-18 DIAGNOSIS — E039 Hypothyroidism, unspecified: Secondary | ICD-10-CM | POA: Insufficient documentation

## 2022-06-18 DIAGNOSIS — Z5112 Encounter for antineoplastic immunotherapy: Secondary | ICD-10-CM | POA: Diagnosis not present

## 2022-06-18 DIAGNOSIS — K754 Autoimmune hepatitis: Secondary | ICD-10-CM | POA: Insufficient documentation

## 2022-06-18 LAB — CBC WITH DIFFERENTIAL (CANCER CENTER ONLY)
Abs Immature Granulocytes: 0.03 10*3/uL (ref 0.00–0.07)
Basophils Absolute: 0 10*3/uL (ref 0.0–0.1)
Basophils Relative: 1 %
Eosinophils Absolute: 0.3 10*3/uL (ref 0.0–0.5)
Eosinophils Relative: 4 %
HCT: 44.1 % (ref 39.0–52.0)
Hemoglobin: 15.6 g/dL (ref 13.0–17.0)
Immature Granulocytes: 0 %
Lymphocytes Relative: 21 %
Lymphs Abs: 1.8 10*3/uL (ref 0.7–4.0)
MCH: 30.1 pg (ref 26.0–34.0)
MCHC: 35.4 g/dL (ref 30.0–36.0)
MCV: 85.1 fL (ref 80.0–100.0)
Monocytes Absolute: 0.5 10*3/uL (ref 0.1–1.0)
Monocytes Relative: 6 %
Neutro Abs: 5.9 10*3/uL (ref 1.7–7.7)
Neutrophils Relative %: 68 %
Platelet Count: 271 10*3/uL (ref 150–400)
RBC: 5.18 MIL/uL (ref 4.22–5.81)
RDW: 13 % (ref 11.5–15.5)
WBC Count: 8.6 10*3/uL (ref 4.0–10.5)
nRBC: 0 % (ref 0.0–0.2)

## 2022-06-18 LAB — CMP (CANCER CENTER ONLY)
ALT: 25 U/L (ref 0–44)
AST: 25 U/L (ref 15–41)
Albumin: 4.4 g/dL (ref 3.5–5.0)
Alkaline Phosphatase: 82 U/L (ref 38–126)
Anion gap: 8 (ref 5–15)
BUN: 16 mg/dL (ref 6–20)
CO2: 24 mmol/L (ref 22–32)
Calcium: 9.4 mg/dL (ref 8.9–10.3)
Chloride: 107 mmol/L (ref 98–111)
Creatinine: 1.33 mg/dL — ABNORMAL HIGH (ref 0.61–1.24)
GFR, Estimated: >60 mL/min (ref >60)
Glucose, Bld: 99 mg/dL (ref 70–99)
Potassium: 4 mmol/L (ref 3.5–5.1)
Sodium: 139 mmol/L (ref 135–145)
Total Bilirubin: 0.4 mg/dL (ref 0.3–1.2)
Total Protein: 7.6 g/dL (ref 6.5–8.1)

## 2022-06-18 LAB — TSH: TSH: 24.844 u[IU]/mL — ABNORMAL HIGH (ref 0.350–4.500)

## 2022-06-18 MED ORDER — SODIUM CHLORIDE 0.9 % IV SOLN: Freq: Once | INTRAVENOUS | Status: AC

## 2022-06-18 MED ORDER — SODIUM CHLORIDE 0.9 % IV SOLN
400.0000 mg | Freq: Once | INTRAVENOUS | Status: AC
  Administered 2022-06-18: 400 mg via INTRAVENOUS
  Filled 2022-06-18: qty 16

## 2022-06-18 NOTE — Patient Instructions (Signed)
[facility]  Discharge Instructions: ?Thank you for choosing [facility] to provide your oncology and hematology care.  ? ?If you have a lab appointment with the Cancer Center, please go directly to the Cancer Center and check in at the registration area. ?  ?Wear comfortable clothing and clothing appropriate for easy access to any Portacath or PICC line.  ? ?We strive to give you quality time with your provider. You may need to reschedule your appointment if you arrive late (15 or more minutes).  Arriving late affects you and other patients whose appointments are after yours.  Also, if you miss three or more appointments without notifying the office, you may be dismissed from the clinic at the provider?s discretion.    ?  ?For prescription refill requests, have your pharmacy contact our office and allow 72 hours for refills to be completed.   ? ?Today you received the following chemotherapy and/or immunotherapy agent: Keytruda    ?  ?To help prevent nausea and vomiting after your treatment, we encourage you to take your nausea medication as directed. ? ?BELOW ARE SYMPTOMS THAT SHOULD BE REPORTED IMMEDIATELY: ?*FEVER GREATER THAN 100.4 F (38 ?C) OR HIGHER ?*CHILLS OR SWEATING ?*NAUSEA AND VOMITING THAT IS NOT CONTROLLED WITH YOUR NAUSEA MEDICATION ?*UNUSUAL SHORTNESS OF BREATH ?*UNUSUAL BRUISING OR BLEEDING ?*URINARY PROBLEMS (pain or burning when urinating, or frequent urination) ?*BOWEL PROBLEMS (unusual diarrhea, constipation, pain near the anus) ?TENDERNESS IN MOUTH AND THROAT WITH OR WITHOUT PRESENCE OF ULCERS (sore throat, sores in mouth, or a toothache) ?UNUSUAL RASH, SWELLING OR PAIN  ?UNUSUAL VAGINAL DISCHARGE OR ITCHING  ? ?Items with * indicate a potential emergency and should be followed up as soon as possible or go to the Emergency Department if any problems should occur. ? ?Please show the CHEMOTHERAPY ALERT CARD or IMMUNOTHERAPY ALERT CARD at check-in to  the Emergency Department and triage nurse. ? ?Should you have questions after your visit or need to cancel or reschedule your appointment, please contact [facility]  Dept: [phone]  and follow the prompts.  Office hours are 8:00 a.m. to 4:30 p.m. Monday - Friday. Please note that voicemails left after 4:00 p.m. may not be returned until the following business day.  We are closed weekends and major holidays. You have access to a nurse at all times for urgent questions. Please call the main number to the clinic Dept: [phone] and follow the prompts. ? ? ?For any non-urgent questions, you may also contact your provider using MyChart. We now offer e-Visits for anyone 18 and older to request care online for non-urgent symptoms. For details visit [email or web address]. ?  ?Also download the MyChart app! Go to the app store, search "MyChart", open the app, select [facility], and log in with your MyChart username and password. ? ?Due to Covid, a mask is required upon entering the hospital/clinic. If you do not have a mask, one will be given to you upon arrival. For doctor visits, patients may have 1 support person aged 18 or older with them. For treatment visits, patients cannot have anyone with them due to current Covid guidelines and our immunocompromised population.  ? ?

## 2022-06-18 NOTE — Progress Notes (Signed)
Hematology and Oncology Follow Up Visit  Michael [name] [id number] 04-11-1966 56 y.o. 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  Secondary diagnosis: Left kidney tumors noted in May 2022.  He was found to have 2.8 x 2.8 x 2.7 mm lesion measuring 2.1 x 2.0 x 1.6.   Prior Therapy: He underwent a right nephrectomy on August 27, 2019 performed while he was living in [place].  The final pathology showed clear cell renal cell carcinoma, nuclear grade 3 with 10% necrosis negative margins.  Final pathological stage was T2ANX.  He is status post Pembrolizumab and axitinib started in March 2021.  He received two cycles of therapy.  Therapy was interrupted because of increased LFTs and autoimmune hepatitis.    He has been receiving his therapy in [place] and relocated to this area.  Axitinib 5 mg daily started on July 05, 2020.  Therapy discontinued on July 13, 2020 because of elevated LFTs.  He is status post cryoablation of a left renal mass completed on May 30, 2021.  Current therapy: Pembrolizumab 200 mg every 3 weeks started on July 27, 2021.  He was switched to 400 mg every 6 weeks started on November 09, 2021.  He returns today for his subsequent cycle of treatment.   Interim History: Michael [name] is here for a follow-up visit.  Since the last visit, he reports feeling well without any major complaints.  He continues to tolerate Pembrolizumab without any new side effects.  He does have some occasional dry mouth but no nausea, vomiting or dysphagia.  He denies any hospitalizations with illnesses.  He continues to be active and attends activities of daily living.  He denies any flank pain or hematuria.    Medications: Reviewed without changes. Current Outpatient Medications  Medication Sig Dispense Refill   amLODipine (NORVASC) 5 MG tablet  Take 7.5 mg by mouth in the morning.     cholecalciferol (VITAMIN D3) 25 MCG (1000 UNIT) tablet Take 1,000 Units by mouth in the morning.     levothyroxine (SYNTHROID) 100 MCG tablet TAKE 1 TABLET(100 MCG) BY MOUTH DAILY BEFORE BREAKFAST 90 tablet 0   levothyroxine (SYNTHROID) 25 MCG tablet TAKE 1 TABLET(25 MCG) BY MOUTH DAILY BEFORE BREAKFAST 90 tablet 1   montelukast (SINGULAIR) 10 MG tablet Take 10 mg by mouth in the morning.     tadalafil (CIALIS) 20 MG tablet Take 10-20 mg by mouth daily as needed for erectile dysfunction.     No current facility-administered medications for this visit.     Allergies:  Allergies  Allergen Reactions   Penicillins Other (See Comments)    Childhood allergies- unk      Physical Exam:           Blood pressure (!) 126/92, pulse 62, temperature 97.7 F (36.5 C), resp. rate 20, weight 248 lb 1.6 oz (112.5 kg), SpO2 99 %.       ECOG: 0   General appearance: Alert, awake without any distress. Head: Atraumatic without abnormalities Oropharynx: Without any thrush or ulcers. Eyes: No scleral icterus. Lymph nodes: No lymphadenopathy noted in the cervical, supraclavicular, or axillary nodes Heart:regular rate and rhythm, without any murmurs or gallops.   Lung: Clear to auscultation without any rhonchi, wheezes or dullness to percussion. Abdomin: Soft, nontender without any shifting dullness or ascites. Musculoskeletal: No clubbing or cyanosis. Neurological: No motor or sensory deficits. Skin: No  rashes or lesions.                    Lab Results: Lab Results  Component Value Date   WBC 6.6 05/01/2022   HGB 15.5 05/01/2022   HCT 44.4 05/01/2022   MCV 85.5 05/01/2022   PLT 279 05/01/2022     Chemistry      Component Value Date/Time   NA 138 05/01/2022 1149   K 4.1 05/01/2022 1149   CL 105 05/01/2022 1149   CO2 26 05/01/2022 1149   BUN 16 05/01/2022 1149   CREATININE 1.21 05/01/2022 1149      Component  Value Date/Time   CALCIUM 9.2 05/01/2022 1149   ALKPHOS 94 05/01/2022 1149   AST 22 05/01/2022 1149   ALT 23 05/01/2022 1149   BILITOT 0.7 05/01/2022 1149         Impression and Plan:  56 year old with:   1. Stage IV clear-cell renal cell carcinoma with lung involvement diagnosed in 2021.  The natural course of this disease was reviewed at this time and treatment choices were discussed.  He continues to tolerate the current treatment with Pembrolizumab without any complications.  Plan is to update his staging scan in September and at [facility] if he has progression of disease.  He is agreeable to proceed and we will update his staging scan before the next visit.    2.  Hypothyroidism: His TSH continues to be monitored and close to normal range.   3.    Left kidney masses: His disease is stable and we will update his staging scans in September.   4.  Goals of care and prognosis: Therapy is palliative but aggressive measures are warranted.   5.  Dry mouth: No major changes noted at this time.  This is related mostly to autoimmune etiology.   6.  Follow-up: In 6 weeks for follow-up.   30  minutes were dedicated to this visit.  The time was spent on reviewing laboratory data, disease's update plan of care discussion.    Zola Button, MD 8/15/202311:05 AM    0 change since

## 2022-06-19 LAB — T4: T4, Total: 6.7 ug/dL (ref 4.5–12.0)

## 2022-07-02 ENCOUNTER — Telehealth: Payer: Self-pay | Admitting: Oncology

## 2022-07-02 NOTE — Telephone Encounter (Signed)
Called patient regarding upcoming September appointment, left a voicemail.  

## 2022-07-04 ENCOUNTER — Other Ambulatory Visit: Payer: Self-pay

## 2022-07-04 DIAGNOSIS — R82998 Other abnormal findings in urine: Secondary | ICD-10-CM | POA: Diagnosis not present

## 2022-07-04 DIAGNOSIS — Z1339 Encounter for screening examination for other mental health and behavioral disorders: Secondary | ICD-10-CM | POA: Diagnosis not present

## 2022-07-04 DIAGNOSIS — C641 Malignant neoplasm of right kidney, except renal pelvis: Secondary | ICD-10-CM | POA: Diagnosis not present

## 2022-07-04 DIAGNOSIS — I10 Essential (primary) hypertension: Secondary | ICD-10-CM | POA: Diagnosis not present

## 2022-07-04 DIAGNOSIS — Z0000 Encounter for general adult medical examination without abnormal findings: Secondary | ICD-10-CM | POA: Diagnosis not present

## 2022-07-04 DIAGNOSIS — Z1331 Encounter for screening for depression: Secondary | ICD-10-CM | POA: Diagnosis not present

## 2022-07-16 ENCOUNTER — Other Ambulatory Visit: Payer: Self-pay

## 2022-07-16 ENCOUNTER — Encounter: Payer: Self-pay | Admitting: *Deleted

## 2022-07-17 ENCOUNTER — Other Ambulatory Visit: Payer: Self-pay

## 2022-07-18 ENCOUNTER — Other Ambulatory Visit: Payer: Self-pay | Admitting: Interventional Radiology

## 2022-07-18 DIAGNOSIS — C649 Malignant neoplasm of unspecified kidney, except renal pelvis: Secondary | ICD-10-CM

## 2022-07-21 ENCOUNTER — Ambulatory Visit (HOSPITAL_COMMUNITY)
Admission: RE | Admit: 2022-07-21 | Discharge: 2022-07-21 | Disposition: A | Discharge status: Home / Self Care | Payer: BC Managed Care – PPO | Source: Ambulatory Visit | Attending: Oncology | Admitting: Oncology

## 2022-07-21 DIAGNOSIS — Z905 Acquired absence of kidney: Secondary | ICD-10-CM | POA: Diagnosis not present

## 2022-07-21 DIAGNOSIS — K7689 Other specified diseases of liver: Secondary | ICD-10-CM | POA: Diagnosis not present

## 2022-07-21 DIAGNOSIS — C649 Malignant neoplasm of unspecified kidney, except renal pelvis: Secondary | ICD-10-CM | POA: Insufficient documentation

## 2022-07-21 DIAGNOSIS — R911 Solitary pulmonary nodule: Secondary | ICD-10-CM | POA: Diagnosis not present

## 2022-07-21 MED ORDER — GADOBUTROL 1 MMOL/ML IV SOLN
10.0000 mL | Freq: Once | INTRAVENOUS | Status: AC | PRN
  Administered 2022-07-21: 10 mL via INTRAVENOUS

## 2022-07-24 ENCOUNTER — Other Ambulatory Visit: Payer: Self-pay

## 2022-07-30 ENCOUNTER — Inpatient Hospital Stay: Payer: BC Managed Care – PPO | Attending: Oncology | Admitting: Oncology

## 2022-07-30 ENCOUNTER — Inpatient Hospital Stay: Payer: BC Managed Care – PPO

## 2022-07-30 VITALS — BP 120/63 | HR 95 | Temp 98.1°F | Resp 18

## 2022-07-30 VITALS — BP 130/90 | HR 61 | Temp 97.7°F | Resp 18 | Ht 66.0 in | Wt 249.0 lb

## 2022-07-30 DIAGNOSIS — C649 Malignant neoplasm of unspecified kidney, except renal pelvis: Secondary | ICD-10-CM

## 2022-07-30 DIAGNOSIS — K754 Autoimmune hepatitis: Secondary | ICD-10-CM | POA: Diagnosis not present

## 2022-07-30 DIAGNOSIS — Z880 Allergy status to penicillin: Secondary | ICD-10-CM | POA: Insufficient documentation

## 2022-07-30 DIAGNOSIS — C642 Malignant neoplasm of left kidney, except renal pelvis: Secondary | ICD-10-CM | POA: Diagnosis not present

## 2022-07-30 DIAGNOSIS — E039 Hypothyroidism, unspecified: Secondary | ICD-10-CM | POA: Insufficient documentation

## 2022-07-30 DIAGNOSIS — Z888 Allergy status to other drugs, medicaments and biological substances status: Secondary | ICD-10-CM | POA: Insufficient documentation

## 2022-07-30 DIAGNOSIS — Z5112 Encounter for antineoplastic immunotherapy: Secondary | ICD-10-CM | POA: Insufficient documentation

## 2022-07-30 DIAGNOSIS — Z881 Allergy status to other antibiotic agents status: Secondary | ICD-10-CM | POA: Insufficient documentation

## 2022-07-30 DIAGNOSIS — Z905 Acquired absence of kidney: Secondary | ICD-10-CM | POA: Insufficient documentation

## 2022-07-30 DIAGNOSIS — R682 Dry mouth, unspecified: Secondary | ICD-10-CM | POA: Diagnosis not present

## 2022-07-30 DIAGNOSIS — Z79890 Hormone replacement therapy (postmenopausal): Secondary | ICD-10-CM | POA: Insufficient documentation

## 2022-07-30 DIAGNOSIS — Z79899 Other long term (current) drug therapy: Secondary | ICD-10-CM | POA: Diagnosis not present

## 2022-07-30 DIAGNOSIS — R7989 Other specified abnormal findings of blood chemistry: Secondary | ICD-10-CM | POA: Diagnosis not present

## 2022-07-30 LAB — CBC WITH DIFFERENTIAL (CANCER CENTER ONLY)
Abs Immature Granulocytes: 0.02 10*3/uL (ref 0.00–0.07)
Basophils Absolute: 0 10*3/uL (ref 0.0–0.1)
Basophils Relative: 0 %
Eosinophils Absolute: 0.1 10*3/uL (ref 0.0–0.5)
Eosinophils Relative: 2 %
HCT: 45.5 % (ref 39.0–52.0)
Hemoglobin: 15.7 g/dL (ref 13.0–17.0)
Immature Granulocytes: 0 %
Lymphocytes Relative: 32 %
Lymphs Abs: 2 10*3/uL (ref 0.7–4.0)
MCH: 29.7 pg (ref 26.0–34.0)
MCHC: 34.5 g/dL (ref 30.0–36.0)
MCV: 86.2 fL (ref 80.0–100.0)
Monocytes Absolute: 0.5 10*3/uL (ref 0.1–1.0)
Monocytes Relative: 8 %
Neutro Abs: 3.6 10*3/uL (ref 1.7–7.7)
Neutrophils Relative %: 58 %
Platelet Count: 255 10*3/uL (ref 150–400)
RBC: 5.28 MIL/uL (ref 4.22–5.81)
RDW: 13.1 % (ref 11.5–15.5)
WBC Count: 6.2 10*3/uL (ref 4.0–10.5)
nRBC: 0 % (ref 0.0–0.2)

## 2022-07-30 LAB — CMP (CANCER CENTER ONLY)
ALT: 24 U/L (ref 0–44)
AST: 22 U/L (ref 15–41)
Albumin: 4.4 g/dL (ref 3.5–5.0)
Alkaline Phosphatase: 95 U/L (ref 38–126)
Anion gap: 5 (ref 5–15)
BUN: 16 mg/dL (ref 6–20)
CO2: 27 mmol/L (ref 22–32)
Calcium: 8.9 mg/dL (ref 8.9–10.3)
Chloride: 107 mmol/L (ref 98–111)
Creatinine: 1.29 mg/dL — ABNORMAL HIGH (ref 0.61–1.24)
GFR, Estimated: >60 mL/min (ref >60)
Glucose, Bld: 113 mg/dL — ABNORMAL HIGH (ref 70–99)
Potassium: 4 mmol/L (ref 3.5–5.1)
Sodium: 139 mmol/L (ref 135–145)
Total Bilirubin: 0.8 mg/dL (ref 0.3–1.2)
Total Protein: 7.8 g/dL (ref 6.5–8.1)

## 2022-07-30 LAB — TSH: TSH: 8.054 u[IU]/mL — ABNORMAL HIGH (ref 0.350–4.500)

## 2022-07-30 MED ORDER — SODIUM CHLORIDE 0.9 % IV SOLN
400.0000 mg | Freq: Once | INTRAVENOUS | Status: AC
  Administered 2022-07-30: 400 mg via INTRAVENOUS
  Filled 2022-07-30: qty 16

## 2022-07-30 MED ORDER — SODIUM CHLORIDE 0.9 % IV SOLN: Freq: Once | INTRAVENOUS | Status: AC

## 2022-07-30 NOTE — Patient Instructions (Addendum)
[facility]  Discharge Instructions: Thank you for choosing [facility] to provide your oncology and hematology care.   If you have a lab appointment with the [facility], please go directly to the [facility] and check in at the registration area.   Wear comfortable clothing and clothing appropriate for easy access to any Portacath or PICC line.   We strive to give you quality time with your provider. You may need to reschedule your appointment if you arrive late (15 or more minutes).  Arriving late affects you and other patients whose appointments are after yours.  Also, if you miss three or more appointments without notifying the office, you may be dismissed from the clinic at the provider's discretion.      For prescription refill requests, have your pharmacy contact our office and allow 72 hours for refills to be completed.    Today you received the following chemotherapy and/or immunotherapy agents: Keytruda.      To help prevent nausea and vomiting after your treatment, we encourage you to take your nausea medication as directed.  BELOW ARE SYMPTOMS THAT SHOULD BE REPORTED IMMEDIATELY: *FEVER GREATER THAN 100.4 F (38 C) OR HIGHER *CHILLS OR SWEATING *NAUSEA AND VOMITING THAT IS NOT CONTROLLED WITH YOUR NAUSEA MEDICATION *UNUSUAL SHORTNESS OF BREATH *UNUSUAL BRUISING OR BLEEDING *URINARY PROBLEMS (pain or burning when urinating, or frequent urination) *BOWEL PROBLEMS (unusual diarrhea, constipation, pain near the anus) TENDERNESS IN MOUTH AND THROAT WITH OR WITHOUT PRESENCE OF ULCERS (sore throat, sores in mouth, or a toothache) UNUSUAL RASH, SWELLING OR PAIN  UNUSUAL VAGINAL DISCHARGE OR ITCHING   Items with * indicate a potential emergency and should be followed up as soon as possible or go to the Emergency Department if any problems should occur.  Please show the CHEMOTHERAPY ALERT CARD or IMMUNOTHERAPY ALERT CARD at check-in to  the Emergency Department and triage nurse.  Should you have questions after your visit or need to cancel or reschedule your appointment, please contact [facility]  Dept: [phone]  and follow the prompts.  Office hours are 8:00 a.m. to 4:30 p.m. Monday - Friday. Please note that voicemails left after 4:00 p.m. may not be returned until the following business day.  We are closed weekends and major holidays. You have access to a nurse at all times for urgent questions. Please call the main number to the clinic Dept: [phone] and follow the prompts.   For any non-urgent questions, you may also contact your provider using MyChart. We now offer e-Visits for anyone 77 and older to request care online for non-urgent symptoms. For details visit [email or web address].   Also download the MyChart app! Go to the app store, search "MyChart", open the app, select [facility], and log in with your MyChart username and password.  Masks are optional in the cancer centers. If you would like for your care team to wear a mask while they are taking care of you, please let them know. You may have one support person who is at least 54 years old accompany you for your appointments. Pembrolizumab Injection What is this medication? PEMBROLIZUMAB (PEM broe LIZ ue mab) treats some types of cancer. It works by helping your immune system slow or stop the spread of cancer cells. It is a monoclonal antibody. This medicine may be used for other purposes; ask your health care provider or pharmacist if you have questions. COMMON BRAND NAME(S): Keytruda What should  I tell my care team before I take this medication? They need to know if you have any of these conditions: Allogeneic stem cell transplant (uses someone else's stem cells) Autoimmune diseases, such as Crohn disease, ulcerative colitis, lupus History of chest radiation Nervous system problems, such as Guillain-Barre syndrome,  myasthenia gravis Organ transplant An unusual or allergic reaction to pembrolizumab, other medications, foods, dyes, or preservatives Pregnant or trying to get pregnant Breast-feeding How should I use this medication? This medication is injected into a vein. It is given by your care team in a hospital or clinic setting. A special MedGuide will be given to you before each treatment. Be sure to read this information carefully each time. Talk to your care team about the use of this medication in children. While it may be prescribed for children as young as 6 months for selected conditions, precautions do apply. Overdosage: If you think you have taken too much of this medicine contact a poison control center or emergency room at once. NOTE: This medicine is only for you. Do not share this medicine with others. What if I miss a dose? Keep appointments for follow-up doses. It is important not to miss your dose. Call your care team if you are unable to keep an appointment. What may interact with this medication? Interactions have not been studied. This list may not describe all possible interactions. Give your health care provider a list of all the medicines, herbs, non-prescription drugs, or dietary supplements you use. Also tell them if you smoke, drink alcohol, or use illegal drugs. Some items may interact with your medicine. What should I watch for while using this medication? Your condition will be monitored carefully while you are receiving this medication. You may need blood work while taking this medication. This medication may cause serious skin reactions. They can happen weeks to months after starting the medication. Contact your care team right away if you notice fevers or flu-like symptoms with a rash. The rash may be red or purple and then turn into blisters or peeling of the skin. You may also notice a red rash with swelling of the face, lips, or lymph nodes in your neck or under your  arms. Tell your care team right away if you have any change in your eyesight. Talk to your care team if you may be pregnant. Serious birth defects can occur if you take this medication during pregnancy and for 4 months after the last dose. You will need a negative pregnancy test before starting this medication. Contraception is recommended while taking this medication and for 4 months after the last dose. Your care team can help you find the option that works for you. Do not breastfeed while taking this medication and for 4 months after the last dose. What side effects may I notice from receiving this medication? Side effects that you should report to your care team as soon as possible: Allergic reactions--skin rash, itching, hives, swelling of the face, lips, tongue, or throat Dry cough, shortness of breath or trouble breathing Eye pain, redness, irritation, or discharge with blurry or decreased vision Heart muscle inflammation--unusual weakness or fatigue, shortness of breath, chest pain, fast or irregular heartbeat, dizziness, swelling of the ankles, feet, or hands Hormone gland problems--headache, sensitivity to light, unusual weakness or fatigue, dizziness, fast or irregular heartbeat, increased sensitivity to cold or heat, excessive sweating, constipation, hair loss, increased thirst or amount of urine, tremors or shaking, irritability Infusion reactions--chest pain, shortness of breath or  trouble breathing, feeling faint or lightheaded Kidney injury (glomerulonephritis)--decrease in the amount of urine, red or dark brown urine, foamy or bubbly urine, swelling of the ankles, hands, or feet Liver injury--right upper belly pain, loss of appetite, nausea, light-colored stool, dark yellow or brown urine, yellowing skin or eyes, unusual weakness or fatigue Pain, tingling, or numbness in the hands or feet, muscle weakness, change in vision, confusion or trouble speaking, loss of balance or  coordination, trouble walking, seizures Rash, fever, and swollen lymph nodes Redness, blistering, peeling, or loosening of the skin, including inside the mouth Sudden or severe stomach pain, bloody diarrhea, fever, nausea, vomiting Side effects that usually do not require medical attention (report to your care team if they continue or are bothersome): Bone, joint, or muscle pain Diarrhea Fatigue Loss of appetite Nausea Skin rash This list may not describe all possible side effects. Call your doctor for medical advice about side effects. You may report side effects to FDA at 1-800-FDA-1088. Where should I keep my medication? This medication is given in a hospital or clinic. It will not be stored at home. NOTE: This sheet is a summary. It may not cover all possible information. If you have questions about this medicine, talk to your doctor, pharmacist, or health care provider.  2023 Elsevier/Gold Standard (2022-02-11 00:00:00)

## 2022-07-30 NOTE — Progress Notes (Signed)
Hematology and Oncology Follow Up Visit  Michael [name] [id number] 03/07/1966 56 y.o. 07/30/2022 11:42 AM [name], MDR[place], [name], MD   Principle Diagnosis: 56 year old man with kidney cancer diagnosed in 2020.  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  Secondary diagnosis: Left kidney tumors noted in May 2022.  He was found to have 2.8 x 2.8 x 2.7 mm lesion measuring 2.1 x 2.0 x 1.6.   Prior Therapy: He underwent a right nephrectomy on August 27, 2019 performed while he was living in [place].  The final pathology showed clear cell renal cell carcinoma, nuclear grade 3 with 10% necrosis negative margins.  Final pathological stage was T2ANX.  He is status post Pembrolizumab and axitinib started in March 2021.  He received two cycles of therapy.  Therapy was interrupted because of increased LFTs and autoimmune hepatitis.    He has been receiving his therapy in [place] and relocated to this area.  Axitinib 5 mg daily started on July 05, 2020.  Therapy discontinued on July 13, 2020 because of elevated LFTs.  He is status post cryoablation of a left renal mass completed on May 30, 2021.  Current therapy: Pembrolizumab 200 mg every 3 weeks started on July 27, 2021.  He was switched to 400 mg every 6 weeks started on November 09, 2021.  He is here for the next cycle of therapy.   Interim History: [name] presents today for repeat follow-up.  Since last visit, he reports no major changes in his health.  He denies any recent hospitalizations or illnesses.  He continues to have issues with dry mouth although no other dermatological or mucosal issues.  He denies any worsening fatigue or tiredness.    Medications: Updated on review. Current Outpatient Medications  Medication Sig Dispense Refill   amLODipine (NORVASC) 5 MG tablet Take 7.5 mg by mouth in the morning.     cholecalciferol (VITAMIN D3) 25 MCG (1000 UNIT) tablet Take 1,000 Units by mouth  in the morning.     levothyroxine (SYNTHROID) 100 MCG tablet TAKE 1 TABLET(100 MCG) BY MOUTH DAILY BEFORE BREAKFAST 90 tablet 0   levothyroxine (SYNTHROID) 25 MCG tablet TAKE 1 TABLET(25 MCG) BY MOUTH DAILY BEFORE BREAKFAST 90 tablet 1   montelukast (SINGULAIR) 10 MG tablet Take 10 mg by mouth in the morning.     tadalafil (CIALIS) 20 MG tablet Take 10-20 mg by mouth daily as needed for erectile dysfunction.     No current facility-administered medications for this visit.     Allergies:  Allergies  Allergen Reactions   Clindamycin Rash   Penicillins Other (See Comments)    Childhood allergies- unk   Tape Rash      Physical Exam:   Blood pressure (!) 130/90, pulse 61, temperature 97.7 F (36.5 C), temperature source Temporal, resp. rate 18, height '5\' 6"'$  (1.676 m), weight 249 lb (112.9 kg), SpO2 98 %.     ECOG: 0   General appearance: Comfortable appearing without any discomfort Head: Normocephalic without any trauma Oropharynx: Mucous membranes are moist and pink without any thrush or ulcers. Eyes: Pupils are equal and round reactive to light. Lymph nodes: No cervical, supraclavicular, inguinal or axillary lymphadenopathy.   Heart:regular rate and rhythm.  S1 and S2 without leg edema. Lung: Clear without any rhonchi or wheezes.  No dullness to percussion. Abdomin: Soft, nontender, nondistended with good bowel sounds.  No hepatosplenomegaly. Musculoskeletal: No joint deformity or effusion.  Full range of motion noted. Neurological:  No deficits noted on motor, sensory and deep tendon reflex exam. Skin: No petechial rash or dryness.  Appeared moist.                     Lab Results: Lab Results  Component Value Date   WBC 8.6 06/18/2022   HGB 15.6 06/18/2022   HCT 44.1 06/18/2022   MCV 85.1 06/18/2022   PLT 271 06/18/2022     Chemistry      Component Value Date/Time   NA 139 06/18/2022 1113   K 4.0 06/18/2022 1113   CL 107 06/18/2022 1113   CO2  24 06/18/2022 1113   BUN 16 06/18/2022 1113   CREATININE 1.33 (H) 06/18/2022 1113      Component Value Date/Time   CALCIUM 9.4 06/18/2022 1113   ALKPHOS 82 06/18/2022 1113   AST 25 06/18/2022 1113   ALT 25 06/18/2022 1113   BILITOT 0.4 06/18/2022 1113      IMPRESSION: 1. No evidence of metastatic disease to the chest. 2. Stable 5 mm RIGHT lower lobe pulmonary nodule. 3. Signs of prior LEFT renal ablation and nephrectomy are partially visualized. 4. Low-density hepatic lesions compatible with numerous cysts showing no change. Please see abdominal MRI for further detail.   IMPRESSION: 1. Post RIGHT nephrectomy and LEFT renal cryoablation. 2. Retroduodenal lymph node or soft tissue nodule with heterogeneous T2 signal, visible on diffusion and with slow growth over time, moderately suspicious for disease recurrence/metastasis in the RIGHT retroperitoneum.    Impression and Plan:  56 year old with:   1.  Kidney cancer diagnosed in 2021.  He was found to have stage IV clear-cell tumor with pulmonary involvement.  He is currently on Pembrolizumab without any major complications.  Imaging studies obtained on 07/21/2022 does not show any evidence of widespread metastatic disease.  He still has a stable 5 mm right lower lobe nodule but does have an enlarging retroduodenal lymph node that is currently around 17 mm.  Management choices were discussed including continuing Pembrolizumab and monitoring of his retroduodenal lymph node, radiation therapy to the switching to VGEF TKI.  After discussion today, he is agreeable to continue with active surveillance and we will repeat MRI in 3 months.   2.  Hypothyroidism: His T4 remains within normal range of his TSH has increased.  We will continue to monitor and this will be repeated today.   3.    Left kidney masses: He is status post ablation without any changes at this time.   4.  Goals of care and prognosis: His disease is incurable but  remains under excellent control and aggressive measures are warranted.   5.  Dry mouth: Unchanged from previous examination.  Likely autoimmune in nature.   6.  Follow-up: He will return in 6 weeks for the next cycle of therapy.   30  minutes were spent on this encounter.  The time was dedicated to reviewing his disease status, treatment choices and outlining future plan of care discussion.    Zola Button, MD 9/26/202311:42 AM    0 change since

## 2022-08-01 LAB — T4: T4, Total: 7.9 ug/dL (ref 4.5–12.0)

## 2022-08-12 ENCOUNTER — Ambulatory Visit
Admission: RE | Admit: 2022-08-12 | Discharge: 2022-08-12 | Disposition: A | Discharge status: Home / Self Care | Payer: BC Managed Care – PPO | Source: Ambulatory Visit | Attending: Interventional Radiology | Admitting: Interventional Radiology

## 2022-08-12 ENCOUNTER — Encounter: Payer: Self-pay | Admitting: Lab

## 2022-08-12 DIAGNOSIS — N2889 Other specified disorders of kidney and ureter: Secondary | ICD-10-CM | POA: Diagnosis not present

## 2022-08-12 HISTORY — PX: IR RADIOLOGIST EVAL & MGMT: IMG5224

## 2022-08-12 NOTE — Progress Notes (Signed)
Chief Complaint: Patient was consulted remotely today (TeleHealth) for follow-up after prior cryoablation of a left renal carcinoma.  History of Present Illness: Michael [name] is a 56 y.o. male status post biopsy and cryoablation of a 3.2 cm lateral exophytic interpolar left renal mass on 05/30/2021.  Additional biopsy of an adjacent 2.2 cm deeper left renal sinus mass was also performed at that time.  Both masses demonstrated clear-cell renal carcinoma, nuclear grade 2 by pathology.  He tolerated cryoablation well without complication or postprocedural symptoms.  He remains on immunotherapy managed by Dr. Alen Blew for known stage IV disease from prior right renal carcinoma and status post prior right nephrectomy in 2020.  He states he has chronic dry mouth from [facility] therapy but otherwise has no current symptoms.  Past Medical History:  Diagnosis Date   Cancer [facility])    Renal cell carcinoma   Hypertension    Hypothyroidism    Sleep apnea    wears cpap    Past Surgical History:  Procedure Laterality Date   IR RADIOLOGIST EVAL & MGMT  04/04/2021   no procedures performed   IR RADIOLOGIST EVAL & MGMT  05/08/2021   no procedures performed   LUNG BIOPSY  2021   RADIOLOGY WITH ANESTHESIA N/A 05/30/2021   Procedure: CT WITH ANESTHESIA CRYOABLATION;  Surgeon: Aletta Edouard, MD;  Location: [facility];  Service: Radiology;  Laterality: N/A;   right nephrectomy Right 08/2019   TONSILLECTOMY      Allergies: Clindamycin, Penicillins, and Tape  Medications: Prior to Admission medications   Medication Sig Start Date End Date Taking? Authorizing Provider  amLODipine (NORVASC) 5 MG tablet Take 7.5 mg by mouth in the morning. 04/23/21   [provider]  cholecalciferol (VITAMIN D3) 25 MCG (1000 UNIT) tablet Take 1,000 Units by mouth in the morning.    [provider]  levothyroxine (SYNTHROID) 100 MCG tablet TAKE 1 TABLET(100 MCG) BY MOUTH DAILY BEFORE BREAKFAST 01/24/22    Wyatt Portela, MD  levothyroxine (SYNTHROID) 25 MCG tablet TAKE 1 TABLET(25 MCG) BY MOUTH DAILY BEFORE BREAKFAST 05/06/22   Wyatt Portela, MD  montelukast (SINGULAIR) 10 MG tablet Take 10 mg by mouth in the morning.    [provider]  tadalafil (CIALIS) 20 MG tablet Take 10-20 mg by mouth daily as needed for erectile dysfunction.    [provider]     No family history on file.  Social History   Socioeconomic History   Marital status: Married    Spouse name: Not on file   Number of children: Not on file   Years of education: Not on file   Highest education level: Not on file  Occupational History   Not on file  Tobacco Use   Smoking status: Never   Smokeless tobacco: Never  Vaping Use   Vaping Use: Never used  Substance and Sexual Activity   Alcohol use: Not Currently    Comment: occasional beer or wine 1 x a month   Drug use: Never   Sexual activity: Yes    Birth control/protection: None  Other Topics Concern   Not on file  Social History Narrative   Not on file   Social Determinants of Health   Financial Resource Strain: Not on file  Food Insecurity: Not on file  Transportation Needs: Not on file  Physical Activity: Not on file  Stress: Not on file  Social Connections: Not on file    ECOG Status: 0 - Asymptomatic  Review of Systems  Constitutional: Negative.   HENT:         Dry mouth.  Respiratory: Negative.    Cardiovascular: Negative.   Gastrointestinal: Negative.   Genitourinary: Negative.   Musculoskeletal: Negative.   Neurological: Negative.     Review of Systems: A 12 point ROS discussed and pertinent positives are indicated in the HPI above.  All other systems are negative.    Physical Exam No direct physical exam was performed (except for noted visual exam findings with Video Visits).   Vital Signs: There were no vitals taken for this visit.  Imaging: MR Abdomen W Wo Contrast  Result Date: 07/22/2022 CLINICAL  DATA:  Kidney cancer on active surveillance, history of RIGHT nephrectomy and metastatic disease as well as LEFT renal ablation. EXAM: MRI ABDOMEN WITHOUT AND WITH CONTRAST TECHNIQUE: Multiplanar multisequence MR imaging of the abdomen was performed both before and after the administration of intravenous contrast. CONTRAST:  57m GADAVIST GADOBUTROL 1 MMOL/ML IV SOLN COMPARISON:  Multiple prior studies most recent exam for comparison is chest CT from September of 2023 also with MRI from March of 2023. FINDINGS: Lower chest: Limited assessment of the lung bases is unremarkable to the extent evaluated on this abdominal MRI. Hepatobiliary: Hepatic cysts which are unchanged. Pancreas: Normal intrinsic T1 signal. No ductal dilation or sign of inflammation. No focal lesion. Spleen:  Normal. Adrenals/Urinary Tract: Adrenal glands are normal. Status post RIGHT nephrectomy. Status post LEFT renal cryoablation measuring 18 x 20 mm at the site of prior ablation previously 21 x 25 mm. No signs of disease recurrence locally in this area. No hydronephrosis. No additional suspicious renal lesions. Retro duodenal nodule or lymph node best seen on diffusion-weighted imaging measuring approximately 17 mm short axis, 11 mm in March of 2023 and is compared to December of 2022 this measured approximately 9 mm. (Image 67/9). This shows heterogeneous appearance on T2 weighted imaging (image 18/38) no retrocaval adenopathy. Stomach/Bowel: No acute gastrointestinal findings to the extent evaluated on this abdominal MRI. Vascular/Lymphatic: Lymph node or nodule in the RIGHT retroperitoneum adjacent to descending duodenum, posterior to the duodenum as discussed. No signs of retroperitoneal adenopathy or suspicious findings elsewhere in the retroperitoneum. Other:  None. Musculoskeletal: No suspicious bone lesions identified. IMPRESSION: 1. Post RIGHT nephrectomy and LEFT renal cryoablation. 2. Retroduodenal lymph node or soft tissue nodule  with heterogeneous T2 signal, visible on diffusion and with slow growth over time, moderately suspicious for disease recurrence/metastasis in the RIGHT retroperitoneum. Electronically Signed   By: GZetta BillsM.D.   On: 07/22/2022 15:20   CT Chest Wo Contrast  Result Date: 07/22/2022 CLINICAL DATA:  56year old male presenting for evaluation of recurrent renal neoplasm. * Tracking Code: BO * EXAM: CT CHEST WITHOUT CONTRAST TECHNIQUE: Multidetector CT imaging of the chest was performed following the standard protocol without IV contrast. RADIATION DOSE REDUCTION: This exam was performed according to the departmental dose-optimization program which includes automated exposure control, adjustment of the mA and/or kV according to patient size and/or use of iterative reconstruction technique. COMPARISON:  June 2023 FINDINGS: Cardiovascular: Ascending thoracic aorta is of normal caliber with scattered atherosclerotic changes. Normal heart size. No pericardial effusion. Normal caliber of central pulmonary vessels. Limited assessment of cardiovascular structures given lack of intravenous contrast. Mediastinum/Nodes: No thoracic inlet, axillary, mediastinal or hilar adenopathy. Esophagus grossly normal. Lungs/Pleura: No signs of consolidation. No evidence of pleural effusion. Airways are patent. No suspicious pulmonary nodules. Stable RIGHT lower lobe nodule measuring 5 mm (image  91/5). Upper Abdomen: Low-density hepatic lesions compatible with numerous cysts showing no change. Please see abdominal MRI for further detail. Signs of prior LEFT renal ablation and nephrectomy are partially visualized. No upper abdominal lymphadenopathy. No acute findings relative to visualized pancreas, spleen and adrenal glands. Musculoskeletal: No acute bone finding. No destructive bone process. Spinal degenerative changes. IMPRESSION: 1. No evidence of metastatic disease to the chest. 2. Stable 5 mm RIGHT lower lobe pulmonary nodule.  3. Signs of prior LEFT renal ablation and nephrectomy are partially visualized. 4. Low-density hepatic lesions compatible with numerous cysts showing no change. Please see abdominal MRI for further detail. Aortic Atherosclerosis (ICD10-I70.0). Electronically Signed   By: Zetta Bills M.D.   On: 07/22/2022 15:03    Labs:  CBC: Recent Labs    03/14/22 0936 05/01/22 1149 06/18/22 1113 07/30/22 1153  WBC 7.6 6.6 8.6 6.2  HGB 15.4 15.5 15.6 15.7  HCT 44.4 44.4 44.1 45.5  PLT 225 279 271 255    COAGS: No results for input(s): "INR", "APTT" in the last 8760 hours.  BMP: Recent Labs    03/14/22 0936 05/01/22 1149 06/18/22 1113 07/30/22 1153  NA 140 138 139 139  K 4.1 4.1 4.0 4.0  CL 107 105 107 107  CO2 '26 26 24 27  '$ GLUCOSE 116* 97 99 113*  BUN '18 16 16 16  '$ CALCIUM 8.9 9.2 9.4 8.9  CREATININE 1.31* 1.21 1.33* 1.29*  GFRNONAA >60 >60 >60 >60    LIVER FUNCTION TESTS: Recent Labs    03/14/22 0936 05/01/22 1149 06/18/22 1113 07/30/22 1153  BILITOT 0.5 0.7 0.4 0.8  AST '18 22 25 22  '$ ALT '19 23 25 24  '$ ALKPHOS 90 94 82 95  PROT 7.6 7.4 7.6 7.8  ALBUMIN 4.2 4.4 4.4 4.4     Assessment and Plan:  There have been several follow-up imaging studies since ablation last July.  The most recent MRI of the abdomen was performed on 07/21/2022 and demonstrates a retracting left lateral cortical ablation defect of the left kidney with no evidence of enhancement to suggest recurrent carcinoma.  The deeper previously noted tumor at the level of the renal sinus is markedly reduced in size and not even definitively detectable currently by MRI.  A right-sided retroduodenal lymph node demonstrates slight enlargement over time since December of last year and is suspicious for potential recurrent/metastatic disease from the prior right renal carcinoma.  CT of the chest also performed on 07/21/2022 demonstrates no significant metastatic disease in the chest with a stable 5 mm right lower lobe  pulmonary nodule.  Dr. Alen Blew has recommended a follow-up MRI in 3 months to follow the retroduodenal lymph node.  Michael [name] will continue on chronic maintenance immunotherapy.  I will continue to follow the ablation defect and left kidney for any signs of left renal carcinoma recurrence.  MRI is clearly the best modality to follow the left kidney as he can receive gadolinium at his current renal function.  Electronically Signed: Azzie Roup 08/12/2022, 8:22 AM    I spent a total of 15 Minutes in remote  clinical consultation, greater than 50% of which was counseling/coordinating care post cryoablation of a left renal carcinoma.    Visit type: Audio only (telephone). Audio (no video) only due to patient's lack of internet/smartphone capability. Alternative for in-person consultation at [facility], [address] [place], [place], [place]. This visit type was conducted due to national recommendations for restrictions regarding the COVID-19 Pandemic (e.g. social distancing).  This format is felt to be most appropriate for this patient at this time.  All issues noted in this document were discussed and addressed.

## 2022-08-17 ENCOUNTER — Other Ambulatory Visit: Payer: Self-pay

## 2022-09-04 ENCOUNTER — Other Ambulatory Visit: Payer: Self-pay

## 2022-09-05 ENCOUNTER — Telehealth: Payer: Self-pay | Admitting: Oncology

## 2022-09-05 NOTE — Telephone Encounter (Signed)
Called patient regarding November and December appointments, patient is notified. 

## 2022-09-10 ENCOUNTER — Inpatient Hospital Stay: Payer: BC Managed Care – PPO | Attending: Oncology

## 2022-09-10 ENCOUNTER — Inpatient Hospital Stay: Payer: BC Managed Care – PPO

## 2022-09-10 ENCOUNTER — Inpatient Hospital Stay (HOSPITAL_BASED_OUTPATIENT_CLINIC_OR_DEPARTMENT_OTHER): Payer: BC Managed Care – PPO | Admitting: Oncology

## 2022-09-10 VITALS — BP 125/88 | HR 58 | Temp 97.7°F | Resp 16 | Wt 251.3 lb

## 2022-09-10 VITALS — BP 122/75 | HR 66 | Resp 18

## 2022-09-10 DIAGNOSIS — Z79899 Other long term (current) drug therapy: Secondary | ICD-10-CM | POA: Diagnosis not present

## 2022-09-10 DIAGNOSIS — C649 Malignant neoplasm of unspecified kidney, except renal pelvis: Secondary | ICD-10-CM | POA: Diagnosis not present

## 2022-09-10 DIAGNOSIS — R682 Dry mouth, unspecified: Secondary | ICD-10-CM | POA: Insufficient documentation

## 2022-09-10 DIAGNOSIS — Z881 Allergy status to other antibiotic agents status: Secondary | ICD-10-CM | POA: Insufficient documentation

## 2022-09-10 DIAGNOSIS — N2889 Other specified disorders of kidney and ureter: Secondary | ICD-10-CM

## 2022-09-10 DIAGNOSIS — Z880 Allergy status to penicillin: Secondary | ICD-10-CM | POA: Diagnosis not present

## 2022-09-10 DIAGNOSIS — E039 Hypothyroidism, unspecified: Secondary | ICD-10-CM | POA: Insufficient documentation

## 2022-09-10 DIAGNOSIS — C642 Malignant neoplasm of left kidney, except renal pelvis: Secondary | ICD-10-CM | POA: Insufficient documentation

## 2022-09-10 DIAGNOSIS — Z905 Acquired absence of kidney: Secondary | ICD-10-CM | POA: Diagnosis not present

## 2022-09-10 DIAGNOSIS — C7800 Secondary malignant neoplasm of unspecified lung: Secondary | ICD-10-CM | POA: Insufficient documentation

## 2022-09-10 DIAGNOSIS — Z888 Allergy status to other drugs, medicaments and biological substances status: Secondary | ICD-10-CM | POA: Diagnosis not present

## 2022-09-10 DIAGNOSIS — Z5112 Encounter for antineoplastic immunotherapy: Secondary | ICD-10-CM | POA: Insufficient documentation

## 2022-09-10 DIAGNOSIS — Z79890 Hormone replacement therapy (postmenopausal): Secondary | ICD-10-CM | POA: Insufficient documentation

## 2022-09-10 DIAGNOSIS — K754 Autoimmune hepatitis: Secondary | ICD-10-CM | POA: Insufficient documentation

## 2022-09-10 LAB — CBC WITH DIFFERENTIAL (CANCER CENTER ONLY)
Abs Immature Granulocytes: 0.01 10*3/uL (ref 0.00–0.07)
Basophils Absolute: 0 10*3/uL (ref 0.0–0.1)
Basophils Relative: 1 %
Eosinophils Absolute: 0.3 10*3/uL (ref 0.0–0.5)
Eosinophils Relative: 4 %
HCT: 41.5 % (ref 39.0–52.0)
Hemoglobin: 14.3 g/dL (ref 13.0–17.0)
Immature Granulocytes: 0 %
Lymphocytes Relative: 26 %
Lymphs Abs: 1.8 10*3/uL (ref 0.7–4.0)
MCH: 29.9 pg (ref 26.0–34.0)
MCHC: 34.5 g/dL (ref 30.0–36.0)
MCV: 86.6 fL (ref 80.0–100.0)
Monocytes Absolute: 0.5 10*3/uL (ref 0.1–1.0)
Monocytes Relative: 8 %
Neutro Abs: 4.1 10*3/uL (ref 1.7–7.7)
Neutrophils Relative %: 61 %
Platelet Count: 269 10*3/uL (ref 150–400)
RBC: 4.79 MIL/uL (ref 4.22–5.81)
RDW: 12.9 % (ref 11.5–15.5)
WBC Count: 6.7 10*3/uL (ref 4.0–10.5)
nRBC: 0 % (ref 0.0–0.2)

## 2022-09-10 LAB — CMP (CANCER CENTER ONLY)
ALT: 24 U/L (ref 0–44)
AST: 23 U/L (ref 15–41)
Albumin: 4 g/dL (ref 3.5–5.0)
Alkaline Phosphatase: 85 U/L (ref 38–126)
Anion gap: 8 (ref 5–15)
BUN: 18 mg/dL (ref 6–20)
CO2: 26 mmol/L (ref 22–32)
Calcium: 8.4 mg/dL — ABNORMAL LOW (ref 8.9–10.3)
Chloride: 106 mmol/L (ref 98–111)
Creatinine: 1.28 mg/dL — ABNORMAL HIGH (ref 0.61–1.24)
GFR, Estimated: >60 mL/min (ref >60)
Glucose, Bld: 105 mg/dL — ABNORMAL HIGH (ref 70–99)
Potassium: 4 mmol/L (ref 3.5–5.1)
Sodium: 140 mmol/L (ref 135–145)
Total Bilirubin: 0.5 mg/dL (ref 0.3–1.2)
Total Protein: 6.8 g/dL (ref 6.5–8.1)

## 2022-09-10 LAB — TSH: TSH: 20.408 u[IU]/mL — ABNORMAL HIGH (ref 0.350–4.500)

## 2022-09-10 MED ORDER — SODIUM CHLORIDE 0.9 % IV SOLN: Freq: Once | INTRAVENOUS | Status: AC

## 2022-09-10 MED ORDER — SODIUM CHLORIDE 0.9 % IV SOLN
400.0000 mg | Freq: Once | INTRAVENOUS | Status: AC
  Administered 2022-09-10: 400 mg via INTRAVENOUS
  Filled 2022-09-10: qty 16

## 2022-09-10 NOTE — Patient Instructions (Signed)
[facility]COL[facility]  Discharge Instructions: Thank you for choosing [facility] to provide your oncology and hematology care.   If you have a lab appointment with the Cancer Center, please go directly to the Cancer Center and check in at the registration area.   Wear comfortable clothing and clothing appropriate for easy access to any Portacath or PICC line.   We strive to give you quality time with your provider. You may need to reschedule your appointment if you arrive late (15 or more minutes).  Arriving late affects you and other patients whose appointments are after yours.  Also, if you miss three or more appointments without notifying the office, you may be dismissed from the clinic at the provider's discretion.      For prescription refill requests, have your pharmacy contact our office and allow 72 hours for refills to be completed.    Today you received the following chemotherapy and/or immunotherapy agents: Keytruda.       To help prevent nausea and vomiting after your treatment, we encourage you to take your nausea medication as directed.  BELOW ARE SYMPTOMS THAT SHOULD BE REPORTED IMMEDIATELY: *FEVER GREATER THAN 100.4 F (38 C) OR HIGHER *CHILLS OR SWEATING *NAUSEA AND VOMITING THAT IS NOT CONTROLLED WITH YOUR NAUSEA MEDICATION *UNUSUAL SHORTNESS OF BREATH *UNUSUAL BRUISING OR BLEEDING *URINARY PROBLEMS (pain or burning when urinating, or frequent urination) *BOWEL PROBLEMS (unusual diarrhea, constipation, pain near the anus) TENDERNESS IN MOUTH AND THROAT WITH OR WITHOUT PRESENCE OF ULCERS (sore throat, sores in mouth, or a toothache) UNUSUAL RASH, SWELLING OR PAIN  UNUSUAL VAGINAL DISCHARGE OR ITCHING   Items with * indicate a potential emergency and should be followed up as soon as possible or go to the Emergency Department if any problems should occur.  Please show the CHEMOTHERAPY ALERT CARD or IMMUNOTHERAPY ALERT CARD at check-in to  the Emergency Department and triage nurse.  Should you have questions after your visit or need to cancel or reschedule your appointment, please contact [facility]  Dept: [phone]  and follow the prompts.  Office hours are 8:00 a.m. to 4:30 p.m. Monday - Friday. Please note that voicemails left after 4:00 p.m. may not be returned until the following business day.  We are closed weekends and major holidays. You have access to a nurse at all times for urgent questions. Please call the main number to the clinic Dept: [phone] and follow the prompts.   For any non-urgent questions, you may also contact your provider using MyChart. We now offer e-Visits for anyone 18 and older to request care online for non-urgent symptoms. For details visit [email or web address].   Also download the MyChart app! Go to the app store, search "MyChart", open the app, select [facility], and log in with your MyChart username and password.  Masks are optional in the cancer centers. If you would like for your care team to wear a mask while they are taking care of you, please let them know. You may have one support person who is at least 54 years old accompany you for your appointments. 

## 2022-09-10 NOTE — Progress Notes (Signed)
Hematology and Oncology Follow Up Visit  Michael [name] [id number] 1966-11-02 56 y.o. 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  Secondary diagnosis: Left kidney tumors noted in May 2022.  He was found to have 2.8 x 2.8 x 2.7 mm lesion measuring 2.1 x 2.0 x 1.6.   Prior Therapy: He underwent a right nephrectomy on August 27, 2019 performed while he was living in [place].  The final pathology showed clear cell renal cell carcinoma, nuclear grade 3 with 10% necrosis negative margins.  Final pathological stage was T2ANX.  He is status post Pembrolizumab and axitinib started in March 2021.  He received two cycles of therapy.  Therapy was interrupted because of increased LFTs and autoimmune hepatitis.    He has been receiving his therapy in [place] and relocated to this area.  Axitinib 5 mg daily started on July 05, 2020.  Therapy discontinued on July 13, 2020 because of elevated LFTs.  He is status post cryoablation of a left renal mass completed on May 30, 2021.  Current therapy: Pembrolizumab 200 mg every 3 weeks started on July 27, 2021.  He was switched to 400 mg every 6 weeks started on November 09, 2021.  He returns for a subsequent cycle of treatment.   Interim History: Michael [name] returns today for repeat evaluation.  Since the last visit, he reports of feeling well without any major complaints.  He denies any nausea, vomiting or abdominal pain.  He denies any fatigue or diarrhea.  His performance status quality of life remain excellent.  He denies any new complications related to Pembrolizumab.    Medications: Reviewed without changes. Current Outpatient Medications  Medication Sig Dispense Refill   amLODipine (NORVASC) 5 MG tablet Take 7.5 mg by mouth in the morning.     cholecalciferol (VITAMIN D3) 25 MCG (1000 UNIT) tablet  Take 1,000 Units by mouth in the morning.     levothyroxine (SYNTHROID) 100 MCG tablet TAKE 1 TABLET(100 MCG) BY MOUTH DAILY BEFORE BREAKFAST 90 tablet 0   levothyroxine (SYNTHROID) 25 MCG tablet TAKE 1 TABLET(25 MCG) BY MOUTH DAILY BEFORE BREAKFAST 90 tablet 1   montelukast (SINGULAIR) 10 MG tablet Take 10 mg by mouth in the morning.     tadalafil (CIALIS) 20 MG tablet Take 10-20 mg by mouth daily as needed for erectile dysfunction.     No current facility-administered medications for this visit.     Allergies:  Allergies  Allergen Reactions   Clindamycin Rash   Penicillins Other (See Comments)    Childhood allergies- unk   Tape Rash      Physical Exam:    Blood pressure 125/88, pulse (!) 58, temperature 97.7 F (36.5 C), temperature source Temporal, resp. rate 16, weight 251 lb 4.8 oz (114 kg), SpO2 97 %.     ECOG: 0     General appearance: Alert, awake without any distress. Head: Atraumatic without abnormalities Oropharynx: Without any thrush or ulcers. Eyes: No scleral icterus. Lymph nodes: No lymphadenopathy noted in the cervical, supraclavicular, or axillary nodes Heart:regular rate and rhythm, without any murmurs or gallops.   Lung: Clear to auscultation without any rhonchi, wheezes or dullness to percussion. Abdomin: Soft, nontender without any shifting dullness or ascites. Musculoskeletal: No clubbing or cyanosis. Neurological: No motor or sensory deficits. Skin: No rashes or lesions. Psychiatric: Mood and affect appeared normal.  Lab Results: Lab Results  Component Value Date   WBC 6.2 07/30/2022   HGB 15.7 07/30/2022   HCT 45.5 07/30/2022   MCV 86.2 07/30/2022   PLT 255 07/30/2022     Chemistry      Component Value Date/Time   NA 139 07/30/2022 1153   K 4.0 07/30/2022 1153   CL 107 07/30/2022 1153   CO2 27 07/30/2022 1153   BUN 16 07/30/2022 1153   CREATININE 1.29 (H) 07/30/2022 1153      Component  Value Date/Time   CALCIUM 8.9 07/30/2022 1153   ALKPHOS 95 07/30/2022 1153   AST 22 07/30/2022 1153   ALT 24 07/30/2022 1153   BILITOT 0.8 07/30/2022 1153       Impression and Plan:  56 year old with:   1.  Stage IV clear-cell renal cell carcinoma with pulmonary involvement diagnosed in 2021.  He remains on single agent Pembrolizumab with excellent tolerance and reasonable response to therapy.  He has had pulmonary nodules remains undetectable or very small at this time.  Risks and benefits of continuing this treatment were discussed.  Complication including autoimmune considerations among others were reiterated.    I recommended continuing at the same dose and schedule and update his staging scans every 3 months.   Adding Lenvima would be considered if he has disease progression at any time.  We will obtain MRI of the abdomen as well in addition to CT scan of the chest to evaluate potential enlargement of retroperitoneal lymph node.     2.  Hypothyroidism: His T4 is within normal range and will continue to monitor and adjust thyroid replacement.   3.    Left kidney masses: MRI obtained on September 2023 showed continuous retraction with the noted ablation defect.  He was evaluated by Dr. Kathlene Cote and satisfied with the current results.   4.  Goals of care and prognosis: Therapy maintenance palliative as his disease is incurable.  Aggressive measures are warranted given his excellent performance status and limited disease involvement.   5.  Dry mouth: Autoimmune in nature and overall manageable.   6.  Follow-up: He will return in 6 weeks for a follow-up visit.   30  minutes were dedicated to this visit.  The time was spent on updating disease status, treatment choices and outlining future plan of care discussion.    Zola Button, MD 11/7/20239:02 AM    0 change since

## 2022-09-11 LAB — T4: T4, Total: 6.7 ug/dL (ref 4.5–12.0)

## 2022-10-01 DIAGNOSIS — I129 Hypertensive chronic kidney disease with stage 1 through stage 4 chronic kidney disease, or unspecified chronic kidney disease: Secondary | ICD-10-CM | POA: Diagnosis not present

## 2022-10-01 DIAGNOSIS — N1831 Chronic kidney disease, stage 3a: Secondary | ICD-10-CM | POA: Diagnosis not present

## 2022-10-01 DIAGNOSIS — E039 Hypothyroidism, unspecified: Secondary | ICD-10-CM | POA: Diagnosis not present

## 2022-10-01 DIAGNOSIS — C641 Malignant neoplasm of right kidney, except renal pelvis: Secondary | ICD-10-CM | POA: Diagnosis not present

## 2022-10-04 ENCOUNTER — Encounter: Payer: Self-pay | Admitting: *Deleted

## 2022-10-04 ENCOUNTER — Other Ambulatory Visit: Payer: Self-pay | Admitting: *Deleted

## 2022-10-04 ENCOUNTER — Telehealth: Payer: Self-pay | Admitting: *Deleted

## 2022-10-04 DIAGNOSIS — C649 Malignant neoplasm of unspecified kidney, except renal pelvis: Secondary | ICD-10-CM

## 2022-10-04 NOTE — Progress Notes (Signed)
Patient called and requested open MRI.  Changed orders to [facility] due to machine size.  Notified patient via my chart since patient did not pick up on phone line.

## 2022-10-04 NOTE — Telephone Encounter (Signed)
Patient called advising that he was told he was due for MRI around 12/15 which hasn't been scheduled yet.  I see order but no expected date.  He is also asking for scan to be done on open MRI machine.    Routing to Dr Alen Blew to ask if due date is correct and if order can be changed to [facility] who does have a larger machine for MRI's.

## 2022-10-05 ENCOUNTER — Other Ambulatory Visit: Payer: Self-pay

## 2022-10-07 ENCOUNTER — Other Ambulatory Visit: Payer: Self-pay

## 2022-10-11 ENCOUNTER — Telehealth: Payer: Self-pay | Admitting: *Deleted

## 2022-10-11 NOTE — Telephone Encounter (Signed)
PC to patient, no answer, left VM - informed patient his MRI has been authorized by insurance & may be scheduled by [employer], [phone].  Also instructed patient to call this office with any questions/concerns, number given.

## 2022-10-15 ENCOUNTER — Telehealth: Payer: Self-pay | Admitting: *Deleted

## 2022-10-15 NOTE — Telephone Encounter (Signed)
Called patient second time regarding his MRI, has been authorized by insurance & may be scheduled by [employer], [phone].  Informed patient he has appointment Dr Alen Blew on 10/22/22 & the scan needs to be done before this appointment so results can be discussed.  Instructed patient to call this office with any questions, phone number given.

## 2022-10-18 ENCOUNTER — Other Ambulatory Visit: Payer: BC Managed Care – PPO

## 2022-10-22 ENCOUNTER — Inpatient Hospital Stay: Payer: BC Managed Care – PPO

## 2022-10-22 ENCOUNTER — Inpatient Hospital Stay (HOSPITAL_BASED_OUTPATIENT_CLINIC_OR_DEPARTMENT_OTHER): Payer: BC Managed Care – PPO | Admitting: Oncology

## 2022-10-22 ENCOUNTER — Inpatient Hospital Stay: Payer: BC Managed Care – PPO | Attending: Oncology

## 2022-10-22 DIAGNOSIS — C7800 Secondary malignant neoplasm of unspecified lung: Secondary | ICD-10-CM | POA: Diagnosis not present

## 2022-10-22 DIAGNOSIS — Z5112 Encounter for antineoplastic immunotherapy: Secondary | ICD-10-CM | POA: Diagnosis not present

## 2022-10-22 DIAGNOSIS — C649 Malignant neoplasm of unspecified kidney, except renal pelvis: Secondary | ICD-10-CM

## 2022-10-22 DIAGNOSIS — C642 Malignant neoplasm of left kidney, except renal pelvis: Secondary | ICD-10-CM | POA: Diagnosis not present

## 2022-10-22 DIAGNOSIS — Z905 Acquired absence of kidney: Secondary | ICD-10-CM | POA: Insufficient documentation

## 2022-10-22 DIAGNOSIS — Z79899 Other long term (current) drug therapy: Secondary | ICD-10-CM | POA: Insufficient documentation

## 2022-10-22 DIAGNOSIS — E039 Hypothyroidism, unspecified: Secondary | ICD-10-CM | POA: Diagnosis not present

## 2022-10-22 LAB — CMP (CANCER CENTER ONLY)
ALT: 26 U/L (ref 0–44)
AST: 24 U/L (ref 15–41)
Albumin: 3.9 g/dL (ref 3.5–5.0)
Alkaline Phosphatase: 87 U/L (ref 38–126)
Anion gap: 8 (ref 5–15)
BUN: 19 mg/dL (ref 6–20)
CO2: 24 mmol/L (ref 22–32)
Calcium: 8.7 mg/dL — ABNORMAL LOW (ref 8.9–10.3)
Chloride: 108 mmol/L (ref 98–111)
Creatinine: 1.34 mg/dL — ABNORMAL HIGH (ref 0.61–1.24)
GFR, Estimated: >60 mL/min (ref >60)
Glucose, Bld: 102 mg/dL — ABNORMAL HIGH (ref 70–99)
Potassium: 4.1 mmol/L (ref 3.5–5.1)
Sodium: 140 mmol/L (ref 135–145)
Total Bilirubin: 0.8 mg/dL (ref 0.3–1.2)
Total Protein: 7.4 g/dL (ref 6.5–8.1)

## 2022-10-22 LAB — CBC WITH DIFFERENTIAL (CANCER CENTER ONLY)
Abs Immature Granulocytes: 0.02 10*3/uL (ref 0.00–0.07)
Basophils Absolute: 0 10*3/uL (ref 0.0–0.1)
Basophils Relative: 1 %
Eosinophils Absolute: 0.3 10*3/uL (ref 0.0–0.5)
Eosinophils Relative: 4 %
HCT: 43.1 % (ref 39.0–52.0)
Hemoglobin: 14.8 g/dL (ref 13.0–17.0)
Immature Granulocytes: 0 %
Lymphocytes Relative: 25 %
Lymphs Abs: 1.9 10*3/uL (ref 0.7–4.0)
MCH: 29.4 pg (ref 26.0–34.0)
MCHC: 34.3 g/dL (ref 30.0–36.0)
MCV: 85.7 fL (ref 80.0–100.0)
Monocytes Absolute: 0.6 10*3/uL (ref 0.1–1.0)
Monocytes Relative: 8 %
Neutro Abs: 4.7 10*3/uL (ref 1.7–7.7)
Neutrophils Relative %: 62 %
Platelet Count: 264 10*3/uL (ref 150–400)
RBC: 5.03 MIL/uL (ref 4.22–5.81)
RDW: 12.7 % (ref 11.5–15.5)
WBC Count: 7.5 10*3/uL (ref 4.0–10.5)
nRBC: 0 % (ref 0.0–0.2)

## 2022-10-22 LAB — TSH: TSH: 9.996 u[IU]/mL — ABNORMAL HIGH (ref 0.350–4.500)

## 2022-10-22 MED ORDER — SODIUM CHLORIDE 0.9 % IV SOLN: Freq: Once | INTRAVENOUS | Status: AC

## 2022-10-22 MED ORDER — SODIUM CHLORIDE 0.9 % IV SOLN
400.0000 mg | Freq: Once | INTRAVENOUS | Status: AC
  Administered 2022-10-22: 400 mg via INTRAVENOUS
  Filled 2022-10-22: qty 16

## 2022-10-22 NOTE — Progress Notes (Signed)
Hematology and Oncology Follow Up Visit  Michael [name] [id number] January 30, 1966 56 y.o. 10/22/2022 9:49 AM [name], [place], Michael Reichmann, MD   Principle Diagnosis: 56 year old man with kidney cancer diagnosed in 2020.  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  Secondary diagnosis: Left kidney tumors noted in May 2022.  He was found to have 2.8 x 2.8 x 2.7 mm lesion measuring 2.1 x 2.0 x 1.6.   Prior Therapy: He underwent a right nephrectomy on August 27, 2019 performed while he was living in [place].  The final pathology showed clear cell renal cell carcinoma, nuclear grade 3 with 10% necrosis negative margins.  Final pathological stage was T2ANX.  He is status post Pembrolizumab and axitinib started in March 2021.  He received two cycles of therapy.  Therapy was interrupted because of increased LFTs and autoimmune hepatitis.    He has been receiving his therapy in [place] and relocated to this area.  Axitinib 5 mg daily started on July 05, 2020.  Therapy discontinued on July 13, 2020 because of elevated LFTs.  He is status post cryoablation of a left renal mass completed on May 30, 2021.  Current therapy: Pembrolizumab 200 mg every 3 weeks started on July 27, 2021.  He was switched to 400 mg every 6 weeks started on November 09, 2021.  He is here for the next cycle of therapy.   Interim History: [name] returns today for follow-up.  Since the last visit, he reports feeling well without any major complaints.  He has reported periodic abdominal discomfort predominantly on the left side and not associated with diet.  He denies any nausea, vomiting or diarrhea.  He denies any changes in bowel habits.  He denies any excessive fatigue or tiredness.  His performance status quality of life remains unchanged.   Medications: Updated on review. Current Outpatient Medications  Medication Sig Dispense Refill   amLODipine (NORVASC) 5 MG  tablet Take 7.5 mg by mouth in the morning.     cholecalciferol (VITAMIN D3) 25 MCG (1000 UNIT) tablet Take 1,000 Units by mouth in the morning.     levothyroxine (SYNTHROID) 100 MCG tablet TAKE 1 TABLET(100 MCG) BY MOUTH DAILY BEFORE BREAKFAST 90 tablet 0   levothyroxine (SYNTHROID) 25 MCG tablet TAKE 1 TABLET(25 MCG) BY MOUTH DAILY BEFORE BREAKFAST 90 tablet 1   montelukast (SINGULAIR) 10 MG tablet Take 10 mg by mouth in the morning.     tadalafil (CIALIS) 20 MG tablet Take 10-20 mg by mouth daily as needed for erectile dysfunction.     No current facility-administered medications for this visit.     Allergies:  Allergies  Allergen Reactions   Clindamycin Rash   Penicillins Other (See Comments)    Childhood allergies- unk   Tape Rash      Physical Exam:    Blood pressure 131/88, pulse 65, temperature 98.2 F (36.8 C), temperature source Temporal, resp. rate 18, height '5\' 6"'$  (1.676 m), weight 251 lb 4.8 oz (114 kg), SpO2 99 %.      ECOG: 0    General appearance: Comfortable appearing without any discomfort Head: Normocephalic without any trauma Oropharynx: Mucous membranes are moist and pink without any thrush or ulcers. Eyes: Pupils are equal and round reactive to light. Lymph nodes: No cervical, supraclavicular, inguinal or axillary lymphadenopathy.   Heart:regular rate and rhythm.  S1 and S2 without leg edema. Lung: Clear without any rhonchi or wheezes.  No dullness to percussion. Abdomin:  Soft, nontender, nondistended with good bowel sounds.  No hepatosplenomegaly. Musculoskeletal: No joint deformity or effusion.  Full range of motion noted. Neurological: No deficits noted on motor, sensory and deep tendon reflex exam. Skin: No petechial rash or dryness.  Appeared moist.                      Lab Results: Lab Results  Component Value Date   WBC 6.7 09/10/2022   HGB 14.3 09/10/2022   HCT 41.5 09/10/2022   MCV 86.6 09/10/2022   PLT 269  09/10/2022     Chemistry      Component Value Date/Time   NA 140 09/10/2022 0904   K 4.0 09/10/2022 0904   CL 106 09/10/2022 0904   CO2 26 09/10/2022 0904   BUN 18 09/10/2022 0904   CREATININE 1.28 (H) 09/10/2022 0904      Component Value Date/Time   CALCIUM 8.4 (L) 09/10/2022 0904   ALKPHOS 85 09/10/2022 0904   AST 23 09/10/2022 0904   ALT 24 09/10/2022 0904   BILITOT 0.5 09/10/2022 0904       Impression and Plan:  56 year old with:   1.  Kidney cancer diagnosed in 2020.  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  The natural course of his disease and treatment options were discussed at this time.  Continues to be on single agent Pembrolizumab with excellent response to therapy.  I have recommended that continuing the same dose and schedule and adding an oral targeted therapy such as Lenvima or cabozantinib if he has progression of disease.  He is scheduled to have MRI of the abdomen in January and will be repeated imaging every 3 months including CT scan of the chest.     2.  Hypothyroidism: He continues to be on thyroid replacement.  Thyroid function will continue to be monitored.   3.    Left kidney masses: He is status post ablation with a subsequent MRI in September 2023 showed excellent response.   4.  Goals of care and prognosis: His disease is incurable although aggressive measures are warranted given his young age and excellent performance status.   5.  Dry mouth: Related to autoimmune issues and appears to be manageable at this time.   6.  Follow-up: In 6 weeks for follow-up evaluation.   30  minutes were spent on this encounter.  The time was dedicated to updating disease status, treatment choices and outlining future plan of care discussion.    Zola Button, MD 12/19/20239:49 AM    0 change since

## 2022-10-23 LAB — T4: T4, Total: 7.6 ug/dL (ref 4.5–12.0)

## 2022-10-24 ENCOUNTER — Other Ambulatory Visit: Payer: Self-pay | Admitting: Oncology

## 2022-10-24 ENCOUNTER — Other Ambulatory Visit: Payer: Self-pay

## 2022-10-24 DIAGNOSIS — C649 Malignant neoplasm of unspecified kidney, except renal pelvis: Secondary | ICD-10-CM

## 2022-11-11 ENCOUNTER — Telehealth: Payer: Self-pay | Admitting: Oncology

## 2022-11-11 NOTE — Telephone Encounter (Signed)
Called [facility] regarding upcoming appointment, patient rescheduled 01/30 appointment to 02/05 due to being out of town.

## 2022-11-12 ENCOUNTER — Ambulatory Visit
Admission: RE | Admit: 2022-11-12 | Discharge: 2022-11-12 | Disposition: A | Discharge status: Home / Self Care | Payer: BC Managed Care – PPO | Source: Ambulatory Visit | Attending: Oncology | Admitting: Oncology

## 2022-11-12 ENCOUNTER — Other Ambulatory Visit: Payer: Self-pay

## 2022-11-12 DIAGNOSIS — R911 Solitary pulmonary nodule: Secondary | ICD-10-CM | POA: Diagnosis not present

## 2022-11-12 DIAGNOSIS — C649 Malignant neoplasm of unspecified kidney, except renal pelvis: Secondary | ICD-10-CM

## 2022-11-13 ENCOUNTER — Ambulatory Visit
Admission: RE | Admit: 2022-11-13 | Discharge: 2022-11-13 | Disposition: A | Discharge status: Home / Self Care | Payer: BC Managed Care – PPO | Source: Ambulatory Visit | Attending: Oncology | Admitting: Oncology

## 2022-11-13 ENCOUNTER — Other Ambulatory Visit: Payer: Self-pay | Admitting: Oncology

## 2022-11-13 ENCOUNTER — Telehealth: Payer: Self-pay | Admitting: Internal Medicine

## 2022-11-13 DIAGNOSIS — C649 Malignant neoplasm of unspecified kidney, except renal pelvis: Secondary | ICD-10-CM

## 2022-11-13 NOTE — Telephone Encounter (Signed)
Called patient regarding February appointments, patient is notified. 

## 2022-11-13 NOTE — Progress Notes (Signed)
The results of the CT scan obtained on November 12, 2022 were personally reviewed and discussed with the patient via phone today.  He has no evidence of disease progression at this time I have recommended continuing the same treatment.  He could not tolerate MRI of the abdomen due to claustrophobia and it was rescheduled to January 27.  I do not recommend any changing his treatment unless his MRI shows disease progression at this time.  All his questions were answered today to his satisfaction.

## 2022-11-20 ENCOUNTER — Other Ambulatory Visit: Payer: Self-pay

## 2022-11-28 ENCOUNTER — Other Ambulatory Visit: Payer: Self-pay | Admitting: Medical Oncology

## 2022-11-28 ENCOUNTER — Telehealth: Payer: Self-pay | Admitting: Medical Oncology

## 2022-11-28 NOTE — Telephone Encounter (Signed)
LVM at [facility] to call me.   Pt needs to be scheduled for OPEN MRI abdomen as pt is claustrophobic . Has he been scheduled ? What do they need to schedule him?  [facility] cannot accommodate pt.

## 2022-11-30 ENCOUNTER — Other Ambulatory Visit: Payer: BC Managed Care – PPO

## 2022-12-03 ENCOUNTER — Ambulatory Visit: Payer: BC Managed Care – PPO

## 2022-12-03 ENCOUNTER — Other Ambulatory Visit: Payer: BC Managed Care – PPO

## 2022-12-03 ENCOUNTER — Ambulatory Visit: Payer: BC Managed Care – PPO | Admitting: Internal Medicine

## 2022-12-04 ENCOUNTER — Telehealth: Payer: Self-pay | Admitting: Medical Oncology

## 2022-12-04 ENCOUNTER — Other Ambulatory Visit: Payer: Self-pay | Admitting: Medical Oncology

## 2022-12-04 DIAGNOSIS — C649 Malignant neoplasm of unspecified kidney, except renal pelvis: Secondary | ICD-10-CM

## 2022-12-04 NOTE — Telephone Encounter (Signed)
Once authorized , please fax MRI Abdomen order to [facility] -(f) [phone] . They have an open MRI.  (Pt was unable to complete the MRI at GI).

## 2022-12-05 ENCOUNTER — Encounter: Payer: Self-pay | Admitting: Oncology

## 2022-12-06 NOTE — Progress Notes (Signed)
Orders for MRI of abdomen and prior authorization was faxed to [facility] at [phone].  Insurance preference.

## 2022-12-09 ENCOUNTER — Inpatient Hospital Stay (HOSPITAL_BASED_OUTPATIENT_CLINIC_OR_DEPARTMENT_OTHER): Payer: BC Managed Care – PPO | Admitting: Internal Medicine

## 2022-12-09 ENCOUNTER — Inpatient Hospital Stay: Payer: BC Managed Care – PPO | Attending: Oncology

## 2022-12-09 ENCOUNTER — Encounter: Payer: Self-pay | Admitting: Internal Medicine

## 2022-12-09 ENCOUNTER — Inpatient Hospital Stay: Payer: BC Managed Care – PPO

## 2022-12-09 VITALS — BP 115/79 | HR 57 | Temp 97.8°F | Resp 18

## 2022-12-09 VITALS — BP 119/81 | HR 61 | Temp 98.1°F | Resp 16 | Wt 254.7 lb

## 2022-12-09 DIAGNOSIS — Z880 Allergy status to penicillin: Secondary | ICD-10-CM | POA: Insufficient documentation

## 2022-12-09 DIAGNOSIS — M7989 Other specified soft tissue disorders: Secondary | ICD-10-CM | POA: Diagnosis not present

## 2022-12-09 DIAGNOSIS — C649 Malignant neoplasm of unspecified kidney, except renal pelvis: Secondary | ICD-10-CM

## 2022-12-09 DIAGNOSIS — C642 Malignant neoplasm of left kidney, except renal pelvis: Secondary | ICD-10-CM | POA: Insufficient documentation

## 2022-12-09 DIAGNOSIS — Z5112 Encounter for antineoplastic immunotherapy: Secondary | ICD-10-CM | POA: Insufficient documentation

## 2022-12-09 DIAGNOSIS — K7689 Other specified diseases of liver: Secondary | ICD-10-CM | POA: Insufficient documentation

## 2022-12-09 DIAGNOSIS — Z905 Acquired absence of kidney: Secondary | ICD-10-CM | POA: Diagnosis not present

## 2022-12-09 DIAGNOSIS — Z881 Allergy status to other antibiotic agents status: Secondary | ICD-10-CM | POA: Diagnosis not present

## 2022-12-09 DIAGNOSIS — C7800 Secondary malignant neoplasm of unspecified lung: Secondary | ICD-10-CM | POA: Diagnosis not present

## 2022-12-09 DIAGNOSIS — K754 Autoimmune hepatitis: Secondary | ICD-10-CM | POA: Insufficient documentation

## 2022-12-09 DIAGNOSIS — Z79899 Other long term (current) drug therapy: Secondary | ICD-10-CM | POA: Insufficient documentation

## 2022-12-09 LAB — CMP (CANCER CENTER ONLY)
ALT: 20 U/L (ref 0–44)
AST: 21 U/L (ref 15–41)
Albumin: 3.3 g/dL — ABNORMAL LOW (ref 3.5–5.0)
Alkaline Phosphatase: 71 U/L (ref 38–126)
Anion gap: 6 (ref 5–15)
BUN: 16 mg/dL (ref 6–20)
CO2: 26 mmol/L (ref 22–32)
Calcium: 8 mg/dL — ABNORMAL LOW (ref 8.9–10.3)
Chloride: 106 mmol/L (ref 98–111)
Creatinine: 1.42 mg/dL — ABNORMAL HIGH (ref 0.61–1.24)
GFR, Estimated: 59 mL/min — ABNORMAL LOW (ref >60)
Glucose, Bld: 103 mg/dL — ABNORMAL HIGH (ref 70–99)
Potassium: 3.9 mmol/L (ref 3.5–5.1)
Sodium: 138 mmol/L (ref 135–145)
Total Bilirubin: 0.4 mg/dL (ref 0.3–1.2)
Total Protein: 5.5 g/dL — ABNORMAL LOW (ref 6.5–8.1)

## 2022-12-09 LAB — CBC WITH DIFFERENTIAL (CANCER CENTER ONLY)
Abs Immature Granulocytes: 0.01 10*3/uL (ref 0.00–0.07)
Basophils Absolute: 0.1 10*3/uL (ref 0.0–0.1)
Basophils Relative: 1 %
Eosinophils Absolute: 0.3 10*3/uL (ref 0.0–0.5)
Eosinophils Relative: 4 %
HCT: 39.8 % (ref 39.0–52.0)
Hemoglobin: 13.7 g/dL (ref 13.0–17.0)
Immature Granulocytes: 0 %
Lymphocytes Relative: 27 %
Lymphs Abs: 1.9 10*3/uL (ref 0.7–4.0)
MCH: 29.1 pg (ref 26.0–34.0)
MCHC: 34.4 g/dL (ref 30.0–36.0)
MCV: 84.7 fL (ref 80.0–100.0)
Monocytes Absolute: 0.8 10*3/uL (ref 0.1–1.0)
Monocytes Relative: 11 %
Neutro Abs: 3.9 10*3/uL (ref 1.7–7.7)
Neutrophils Relative %: 57 %
Platelet Count: 297 10*3/uL (ref 150–400)
RBC: 4.7 MIL/uL (ref 4.22–5.81)
RDW: 13.1 % (ref 11.5–15.5)
WBC Count: 6.8 10*3/uL (ref 4.0–10.5)
nRBC: 0 % (ref 0.0–0.2)

## 2022-12-09 LAB — TSH: TSH: 50.394 u[IU]/mL — ABNORMAL HIGH (ref 0.350–4.500)

## 2022-12-09 MED ORDER — SODIUM CHLORIDE 0.9 % IV SOLN: Freq: Once | INTRAVENOUS | Status: AC

## 2022-12-09 MED ORDER — SODIUM CHLORIDE 0.9 % IV SOLN
400.0000 mg | Freq: Once | INTRAVENOUS | Status: AC
  Administered 2022-12-09: 400 mg via INTRAVENOUS
  Filled 2022-12-09: qty 16

## 2022-12-09 NOTE — Patient Instructions (Signed)
[facility]  Discharge Instructions: Thank you for choosing [facility] to provide your oncology and hematology care.   If you have a lab appointment with the [facility], please go directly to the [facility] and check in at the registration area.   Wear comfortable clothing and clothing appropriate for easy access to any Portacath or PICC line.   We strive to give you quality time with your provider. You may need to reschedule your appointment if you arrive late (15 or more minutes).  Arriving late affects you and other patients whose appointments are after yours.  Also, if you miss three or more appointments without notifying the office, you may be dismissed from the clinic at the provider's discretion.      For prescription refill requests, have your pharmacy contact our office and allow 72 hours for refills to be completed.    Today you received the following chemotherapy and/or immunotherapy agent: Pembrolizumab (Keytruda)   To help prevent nausea and vomiting after your treatment, we encourage you to take your nausea medication as directed.  BELOW ARE SYMPTOMS THAT SHOULD BE REPORTED IMMEDIATELY: *FEVER GREATER THAN 100.4 F (38 C) OR HIGHER *CHILLS OR SWEATING *NAUSEA AND VOMITING THAT IS NOT CONTROLLED WITH YOUR NAUSEA MEDICATION *UNUSUAL SHORTNESS OF BREATH *UNUSUAL BRUISING OR BLEEDING *URINARY PROBLEMS (pain or burning when urinating, or frequent urination) *BOWEL PROBLEMS (unusual diarrhea, constipation, pain near the anus) TENDERNESS IN MOUTH AND THROAT WITH OR WITHOUT PRESENCE OF ULCERS (sore throat, sores in mouth, or a toothache) UNUSUAL RASH, SWELLING OR PAIN  UNUSUAL VAGINAL DISCHARGE OR ITCHING   Items with * indicate a potential emergency and should be followed up as soon as possible or go to the Emergency Department if any problems should occur.  Please show the CHEMOTHERAPY ALERT CARD or IMMUNOTHERAPY ALERT  CARD at check-in to the Emergency Department and triage nurse.  Should you have questions after your visit or need to cancel or reschedule your appointment, please contact [facility]  Dept: [phone]  and follow the prompts.  Office hours are 8:00 a.m. to 4:30 p.m. Monday - Friday. Please note that voicemails left after 4:00 p.m. may not be returned until the following business day.  We are closed weekends and major holidays. You have access to a nurse at all times for urgent questions. Please call the main number to the clinic Dept: [phone] and follow the prompts.   For any non-urgent questions, you may also contact your provider using MyChart. We now offer e-Visits for anyone 85 and older to request care online for non-urgent symptoms. For details visit [email or web address].   Also download the MyChart app! Go to the app store, search "MyChart", open the app, select [facility], and log in with your MyChart username and password.  Pembrolizumab Injection What is this medication? PEMBROLIZUMAB (PEM broe LIZ ue mab) treats some types of cancer. It works by helping your immune system slow or stop the spread of cancer cells. It is a monoclonal antibody. This medicine may be used for other purposes; ask your health care provider or pharmacist if you have questions. COMMON BRAND NAME(S): Keytruda What should I tell my care team before I take this medication? They need to know if you have any of these conditions: Allogeneic stem cell transplant (uses someone else's stem cells) Autoimmune diseases, such as Crohn disease, ulcerative colitis, lupus History of chest radiation Nervous system problems,  such as Guillain-Barre syndrome, myasthenia gravis Organ transplant An unusual or allergic reaction to pembrolizumab, other medications, foods, dyes, or preservatives Pregnant or trying to get pregnant Breast-feeding How should I use this medication? This  medication is injected into a vein. It is given by your care team in a hospital or clinic setting. A special MedGuide will be given to you before each treatment. Be sure to read this information carefully each time. Talk to your care team about the use of this medication in children. While it may be prescribed for children as young as 6 months for selected conditions, precautions do apply. Overdosage: If you think you have taken too much of this medicine contact a poison control center or emergency room at once. NOTE: This medicine is only for you. Do not share this medicine with others. What if I miss a dose? Keep appointments for follow-up doses. It is important not to miss your dose. Call your care team if you are unable to keep an appointment. What may interact with this medication? Interactions have not been studied. This list may not describe all possible interactions. Give your health care provider a list of all the medicines, herbs, non-prescription drugs, or dietary supplements you use. Also tell them if you smoke, drink alcohol, or use illegal drugs. Some items may interact with your medicine. What should I watch for while using this medication? Your condition will be monitored carefully while you are receiving this medication. You may need blood work while taking this medication. This medication may cause serious skin reactions. They can happen weeks to months after starting the medication. Contact your care team right away if you notice fevers or flu-like symptoms with a rash. The rash may be red or purple and then turn into blisters or peeling of the skin. You may also notice a red rash with swelling of the face, lips, or lymph nodes in your neck or under your arms. Tell your care team right away if you have any change in your eyesight. Talk to your care team if you may be pregnant. Serious birth defects can occur if you take this medication during pregnancy and for 4 months after the last  dose. You will need a negative pregnancy test before starting this medication. Contraception is recommended while taking this medication and for 4 months after the last dose. Your care team can help you find the option that works for you. Do not breastfeed while taking this medication and for 4 months after the last dose. What side effects may I notice from receiving this medication? Side effects that you should report to your care team as soon as possible: Allergic reactions--skin rash, itching, hives, swelling of the face, lips, tongue, or throat Dry cough, shortness of breath or trouble breathing Eye pain, redness, irritation, or discharge with blurry or decreased vision Heart muscle inflammation--unusual weakness or fatigue, shortness of breath, chest pain, fast or irregular heartbeat, dizziness, swelling of the ankles, feet, or hands Hormone gland problems--headache, sensitivity to light, unusual weakness or fatigue, dizziness, fast or irregular heartbeat, increased sensitivity to cold or heat, excessive sweating, constipation, hair loss, increased thirst or amount of urine, tremors or shaking, irritability Infusion reactions--chest pain, shortness of breath or trouble breathing, feeling faint or lightheaded Kidney injury (glomerulonephritis)--decrease in the amount of urine, red or dark brown urine, foamy or bubbly urine, swelling of the ankles, hands, or feet Liver injury--right upper belly pain, loss of appetite, nausea, light-colored stool, dark yellow or brown urine,  yellowing skin or eyes, unusual weakness or fatigue Pain, tingling, or numbness in the hands or feet, muscle weakness, change in vision, confusion or trouble speaking, loss of balance or coordination, trouble walking, seizures Rash, fever, and swollen lymph nodes Redness, blistering, peeling, or loosening of the skin, including inside the mouth Sudden or severe stomach pain, bloody diarrhea, fever, nausea, vomiting Side effects  that usually do not require medical attention (report to your care team if they continue or are bothersome): Bone, joint, or muscle pain Diarrhea Fatigue Loss of appetite Nausea Skin rash This list may not describe all possible side effects. Call your doctor for medical advice about side effects. You may report side effects to FDA at 1-800-FDA-1088. Where should I keep my medication? This medication is given in a hospital or clinic. It will not be stored at home. NOTE: This sheet is a summary. It may not cover all possible information. If you have questions about this medicine, talk to your doctor, pharmacist, or health care provider.  2023 Elsevier/Gold Standard (2013-07-12 00:00:00)

## 2022-12-09 NOTE — Progress Notes (Signed)
Michael [name] [phone]   [phone]  OFFICE PROGRESS NOTE  Michael Baton, MD [place] [address]  DIAGNOSIS: Stage IV clear-cell renal cell carcinoma initially diagnosed as locally advanced disease in 2020 with evidence of pulmonary metastasis in 2021.  PRIOR THERAPY: 1) status post right nephrectomy on August 27, 2019 in [place] and the final pathology showed clear-cell renal cell carcinoma nuclear grade 3 with 10% necrosis and negative margin with the final pathologic stage of T2a, NX. 2) status post treatment with Keytruda and axitinib started March 2021 for 2 cycles interrupted because of autoimmune hepatitis.  3) axitinib 5 mg daily started July 05, 2020 discontinued after 1 week because of elevated LFTs. 4) status post cryoablation of left renal mass on May 30, 2021.  CURRENT THERAPY: Keytruda 200 Mg IV every 3 weeks started July 27, 2021 and then switch it to 400 Mg every 6 weeks on November 09, 2021.  INTERVAL HISTORY: Michael [name] 57 y.o. male came to the clinic today to establish care with me after his primary oncologist Dr. Alen [name] left the practice.  The patient was accompanied by his wife Michael [name].  He has no significant complaints today.  He was diagnosed with a stage IV clear-cell renal cell carcinoma initially in 2020 with pulmonary metastasis in 2021.  He underwent several treatment in the past including right nephrectomy as well as treatment with Keytruda and axitinib for 2 cycles before it was discontinued secondary to autoimmune hepatitis.  The patient was also treated with axitinib discontinued secondary to elevated liver enzymes.  He also status post cryotherapy of left renal mass in July 2022.  He has been on treatment with Keytruda since September 2022 and currently on the 6 weeks regimen.  He has been tolerating this treatment well with no concerning adverse effects.  He denied having any skin rash but has  occasional itching.  He has no nausea, vomiting, diarrhea or constipation.  He has no chest pain, shortness of breath, cough or hemoptysis.  He has no recent weight loss or night sweats.  He has occasional swelling of the lower extremity secondary to treatment with Norvasc.  The patient had repeat CT scan of the chest performed few weeks ago and he is here for evaluation and discussion of his scan results and treatment options.  He is married and has 3 children as well as 2 stepchildren.  He works for [employer].  He has no history for smoking but drinks alcohol occasionally and no history of drug abuse.  MEDICAL HISTORY: Past Medical History:  Diagnosis Date   Cancer [facility])    Renal cell carcinoma   Hypertension    Hypothyroidism    Sleep apnea    wears cpap    ALLERGIES:  is allergic to clindamycin, penicillins, and tape.  MEDICATIONS:  Current Outpatient Medications  Medication Sig Dispense Refill   amLODipine (NORVASC) 5 MG tablet Take 7.5 mg by mouth in the morning.     cholecalciferol (VITAMIN D3) 25 MCG (1000 UNIT) tablet Take 1,000 Units by mouth in the morning.     levothyroxine (SYNTHROID) 100 MCG tablet TAKE 1 TABLET(100 MCG) BY MOUTH DAILY BEFORE BREAKFAST 90 tablet 0   levothyroxine (SYNTHROID) 25 MCG tablet TAKE 1 TABLET(25 MCG) BY MOUTH DAILY BEFORE BREAKFAST 90 tablet 1   montelukast (SINGULAIR) 10 MG tablet Take 10 mg by mouth in the morning.     sodium chloride 0.9 % SOLN  50 mL with pembrolizumab 100 MG/4ML SOLN 200 mg Inject 200 mg into the vein every 21 ( twenty-one) days.     tadalafil (CIALIS) 20 MG tablet Take 10-20 mg by mouth daily as needed for erectile dysfunction.     No current facility-administered medications for this visit.    SURGICAL HISTORY:  Past Surgical History:  Procedure Laterality Date   IR RADIOLOGIST EVAL & MGMT  04/04/2021   no procedures performed   IR RADIOLOGIST EVAL & MGMT  05/08/2021   no procedures performed    IR RADIOLOGIST EVAL & MGMT  08/12/2022   LUNG BIOPSY  2021   RADIOLOGY WITH ANESTHESIA N/A 05/30/2021   Procedure: CT WITH ANESTHESIA CRYOABLATION;  Surgeon: Aletta Edouard, MD;  Location: [facility];  Service: Radiology;  Laterality: N/A;   right nephrectomy Right 08/2019   TONSILLECTOMY      REVIEW OF SYSTEMS:  Constitutional: negative Eyes: negative Ears, nose, mouth, throat, and face: negative Respiratory: negative Cardiovascular: negative Gastrointestinal: negative Genitourinary:negative Integument/breast: positive for pruritus Hematologic/lymphatic: negative Musculoskeletal:negative Neurological: negative Behavioral/Psych: negative Endocrine: negative Allergic/Immunologic: negative   PHYSICAL EXAMINATION: General appearance: alert, cooperative, appears stated age, and no distress Head: Normocephalic, without obvious abnormality, atraumatic Neck: no adenopathy, no JVD, supple, symmetrical, trachea midline, and thyroid not enlarged, symmetric, no tenderness/mass/nodules Lymph nodes: Cervical, supraclavicular, and axillary nodes normal. Resp: clear to auscultation bilaterally Back: symmetric, no curvature. ROM normal. No CVA tenderness. Cardio: regular rate and rhythm, S1, S2 normal, no murmur, click, rub or gallop GI: soft, non-tender; bowel sounds normal; no masses,  no organomegaly Extremities: extremities normal, atraumatic, no cyanosis or edema Neurologic: Alert and oriented X 3, normal strength and tone. Normal symmetric reflexes. Normal coordination and gait  ECOG PERFORMANCE STATUS: 1 - Symptomatic but completely ambulatory  Blood pressure 119/81, pulse 61, temperature 98.1 F (36.7 C), temperature source Oral, resp. rate 16, weight 254 lb 11.2 oz (115.5 kg), SpO2 99 %.  LABORATORY DATA: Lab Results  Component Value Date   WBC 6.8 12/09/2022   HGB 13.7 12/09/2022   HCT 39.8 12/09/2022   MCV 84.7 12/09/2022   PLT 297 12/09/2022      Chemistry      Component  Value Date/Time   NA 138 12/09/2022 0932   K 3.9 12/09/2022 0932   CL 106 12/09/2022 0932   CO2 26 12/09/2022 0932   BUN 16 12/09/2022 0932   CREATININE 1.42 (H) 12/09/2022 0932      Component Value Date/Time   CALCIUM 8.0 (L) 12/09/2022 0932   ALKPHOS 71 12/09/2022 0932   AST 21 12/09/2022 0932   ALT 20 12/09/2022 0932   BILITOT 0.4 12/09/2022 0932       RADIOGRAPHIC STUDIES: CT Chest Wo Contrast  Result Date: 11/12/2022 CLINICAL DATA:  Renal cell carcinoma.  On immunotherapy. EXAM: CT CHEST WITHOUT CONTRAST TECHNIQUE: Multidetector CT imaging of the chest was performed following the standard protocol without IV contrast. RADIATION DOSE REDUCTION: This exam was performed according to the departmental dose-optimization program which includes automated exposure control, adjustment of the mA and/or kV according to patient size and/or use of iterative reconstruction technique. COMPARISON:  CT 07/21/2022 and older FINDINGS: Cardiovascular: On this non IV contrast exam the thoracic aorta is normal course and caliber. Minimal calcified plaque. No pericardial effusion. The heart is nonenlarged. Mediastinum/Nodes: Atrophic thyroid gland. No specific abnormal lymph node enlargement identified in the axillary region, hilum or mediastinum. Normal caliber thoracic esophagus. Lungs/Pleura: In the right lung is a 4  mm nodule right lower lobe on series 8, image 92, unchanged from September 2023. This has been stable since at least January 2021, almost 3 years of stability. There are some small areas of nodularity in the left lower lobe such as measuring 5 mm on series 8 image 115. Additional small ill-defined nodular opacities more caudal such as image 125, subpleural. These were not seen on the previous examination. Additional small areas medially on image 110. These can be more infiltrative. No consolidation, pneumothorax or effusion. Upper Abdomen: Along the upper abdomen the adrenal glands are preserved.  Surgical changes from right nephrectomy with surgical clips. Benign-appearing hepatic cysts are again seen and unchanged. Musculoskeletal: Mild degenerative changes noted along the spine. IMPRESSION: Stable right lower lobe lung nodule. This has been stable since at least January 2021. Subtle nodular opacity along the left lower lobe just above the diaphragm. These are new from previous but may be more infiltrative and recommend short follow-up in 3 months. Electronically Signed   By: Jill Side M.D.   On: 11/12/2022 17:50    ASSESSMENT AND PLAN: This is a very pleasant 57 years old white male with Stage IV clear-cell renal cell carcinoma initially diagnosed as locally advanced disease in 2020 with evidence of pulmonary metastasis in 2021.  He is status post right nephrectomy on August 27, 2019 in [place] and the final pathology showed clear-cell renal cell carcinoma nuclear grade 3 with 10% necrosis and negative margin with the final pathologic stage of T2a, NX. The patient is status post treatment with [facility] and axitinib started March 2021 for 2 cycles interrupted because of autoimmune hepatitis. He then  started Axitinib 5 mg daily on July 05, 2020 discontinued after 1 week because of elevated LFTs. He is also status post cryoablation of left renal mass on May 30, 2021. The patient is currently undergoing treatment with Keytruda 200 Mg IV every 3 weeks started July 27, 2021 and then switch it to 400 Mg every 6 weeks on November 09, 2021. The patient has been tolerating this treatment well with no concerning adverse effects. He had CT scan of the chest performed recently.  His scan showed no concerning findings for disease progression.  He is scheduled to have MRI of the abdomen next week. I recommended for him to continue his current treatment with Keytruda 400 Mg IV every 6 weeks. I will see him back for follow-up visit at that time. If the MRI showed any concerning findings, I will call  the patient with further recommendation. He was advised to call immediately if he has any other concerning symptoms in the interval. The patient voices understanding of current disease status and treatment options and is in agreement with the current care plan.  All questions were answered. The patient knows to call the clinic with any problems, questions or concerns. We can certainly see the patient much sooner if necessary.  The total time spent in the appointment was 55 minutes.  Disclaimer: This note was dictated with voice recognition software. Similar sounding words can inadvertently be transcribed and may not be corrected upon review.

## 2022-12-10 LAB — T4: T4, Total: 5.4 ug/dL (ref 4.5–12.0)

## 2022-12-11 ENCOUNTER — Other Ambulatory Visit: Payer: Self-pay

## 2022-12-12 ENCOUNTER — Telehealth: Payer: Self-pay | Admitting: *Deleted

## 2022-12-12 ENCOUNTER — Telehealth: Payer: Self-pay | Admitting: Internal Medicine

## 2022-12-12 NOTE — Telephone Encounter (Signed)
Michael [name] left a message stating that he is scheduled for an appt on 2/20, states he is not due for treatment for 6 weeks.   Is also wanting to know why is TSH has escalated.   Please advise

## 2022-12-12 NOTE — Telephone Encounter (Signed)
Notified that we have cancelled the 2/20 appts per Dr Julien Nordmann and he increased his levothyroxine due to TSH increase. It is probably related to immunotherapy

## 2022-12-12 NOTE — Telephone Encounter (Signed)
Called patient regarding upcoming February/March appointments, left a voicemail.

## 2022-12-18 ENCOUNTER — Encounter: Payer: Self-pay | Admitting: Medical Oncology

## 2022-12-18 DIAGNOSIS — C642 Malignant neoplasm of left kidney, except renal pelvis: Secondary | ICD-10-CM | POA: Diagnosis not present

## 2022-12-23 ENCOUNTER — Other Ambulatory Visit: Payer: Self-pay

## 2022-12-24 ENCOUNTER — Other Ambulatory Visit: Payer: BC Managed Care – PPO

## 2022-12-24 ENCOUNTER — Ambulatory Visit: Payer: BC Managed Care – PPO

## 2022-12-28 ENCOUNTER — Other Ambulatory Visit: Payer: Self-pay

## 2023-01-03 ENCOUNTER — Ambulatory Visit
Admission: RE | Admit: 2023-01-03 | Discharge: 2023-01-03 | Disposition: A | Discharge status: Home / Self Care | Payer: BC Managed Care – PPO | Source: Ambulatory Visit | Attending: Oncology | Admitting: Oncology

## 2023-01-03 DIAGNOSIS — Z4889 Encounter for other specified surgical aftercare: Secondary | ICD-10-CM | POA: Diagnosis not present

## 2023-01-03 NOTE — Progress Notes (Signed)
Chief Complaint: Patient was consulted remotely today (TeleHealth) for follow-up after prior cryoablation of a left renal carcinoma.   History of Present Illness: Michael [name] is a 57 y.o. male status post biopsy and cryoablation of a 3.2 cm lateral exophytic interpolar left renal mass on 05/30/2021.  Additional biopsy of an adjacent 2.2 cm deeper left renal sinus mass was also performed at that time.  Both masses demonstrated clear-cell renal carcinoma, nuclear grade 2 by pathology.  He tolerated cryoablation well without complication or postprocedural symptoms.  He remains on immunotherapy for known stage IV disease from prior right renal carcinoma and status post prior right nephrectomy in 2020.  His oncologic care has now been transferred from Dr. Alen Blew to Dr. Julien Nordmann.  Past Medical History:  Diagnosis Date   Cancer [facility])    Renal cell carcinoma   Hypertension    Hypothyroidism    Sleep apnea    wears cpap    Past Surgical History:  Procedure Laterality Date   IR RADIOLOGIST EVAL & MGMT  04/04/2021   no procedures performed   IR RADIOLOGIST EVAL & MGMT  05/08/2021   no procedures performed   IR RADIOLOGIST EVAL & MGMT  08/12/2022   LUNG BIOPSY  2021   RADIOLOGY WITH ANESTHESIA N/A 05/30/2021   Procedure: CT WITH ANESTHESIA CRYOABLATION;  Surgeon: Aletta Edouard, MD;  Location: [facility];  Service: Radiology;  Laterality: N/A;   right nephrectomy Right 08/2019   TONSILLECTOMY      Allergies: Clindamycin, Penicillins, and Tape  Medications: Prior to Admission medications   Medication Sig Start Date End Date Taking? Authorizing Provider  amLODipine (NORVASC) 5 MG tablet Take 7.5 mg by mouth in the morning. 04/23/21   [provider]  cholecalciferol (VITAMIN D3) 25 MCG (1000 UNIT) tablet Take 1,000 Units by mouth in the morning.    [provider]  levothyroxine (SYNTHROID) 100 MCG tablet TAKE 1 TABLET(100 MCG) BY MOUTH DAILY BEFORE BREAKFAST 01/24/22    Wyatt Portela, MD  levothyroxine (SYNTHROID) 25 MCG tablet TAKE 1 TABLET(25 MCG) BY MOUTH DAILY BEFORE BREAKFAST 05/06/22   Wyatt Portela, MD  montelukast (SINGULAIR) 10 MG tablet Take 10 mg by mouth in the morning.    [provider]  sodium chloride 0.9 % SOLN 50 mL with pembrolizumab 100 MG/4ML SOLN 200 mg Inject 200 mg into the vein every 21 ( twenty-one) days.    [provider]  tadalafil (CIALIS) 20 MG tablet Take 10-20 mg by mouth daily as needed for erectile dysfunction.    [provider]     No family history on file.  Social History   Socioeconomic History   Marital status: Married    Spouse name: Not on file   Number of children: Not on file   Years of education: Not on file   Highest education level: Not on file  Occupational History   Not on file  Tobacco Use   Smoking status: Never   Smokeless tobacco: Never  Vaping Use   Vaping Use: Never used  Substance and Sexual Activity   Alcohol use: Not Currently    Comment: occasional beer or wine 1 x a month   Drug use: Never   Sexual activity: Yes    Birth control/protection: None  Other Topics Concern   Not on file  Social History Narrative   Not on file   Social Determinants of Health   Financial Resource Strain: Not on file  Food Insecurity:  Not on file  Transportation Needs: Not on file  Physical Activity: Not on file  Stress: Not on file  Social Connections: Not on file    ECOG Status: 0 - Asymptomatic  Review of Systems  Constitutional: Negative.   HENT:         Chronic dry mouth on Keytruda  Respiratory: Negative.    Cardiovascular: Negative.   Gastrointestinal: Negative.   Genitourinary: Negative.   Musculoskeletal: Negative.   Neurological: Negative.     Review of Systems: A 12 point ROS discussed and pertinent positives are indicated in the HPI above.  All other systems are negative.    Physical Exam No direct physical exam was performed (except for noted  visual exam findings with Video Visits).   Vital Signs: There were no vitals taken for this visit.  Imaging: No results found.  Labs:  CBC: Recent Labs    07/30/22 1153 09/10/22 0904 10/22/22 0933 12/09/22 0932  WBC 6.2 6.7 7.5 6.8  HGB 15.7 14.3 14.8 13.7  HCT 45.5 41.5 43.1 39.8  PLT 255 269 264 297    COAGS: No results for input(s): "INR", "APTT" in the last 8760 hours.  BMP: Recent Labs    07/30/22 1153 09/10/22 0904 10/22/22 0933 12/09/22 0932  NA 139 140 140 138  K 4.0 4.0 4.1 3.9  CL 107 106 108 106  CO2 '27 26 24 26  '$ GLUCOSE 113* 105* 102* 103*  BUN '16 18 19 16  '$ CALCIUM 8.9 8.4* 8.7* 8.0*  CREATININE 1.29* 1.28* 1.34* 1.42*  GFRNONAA >60 >60 >60 59*    LIVER FUNCTION TESTS: Recent Labs    07/30/22 1153 09/10/22 0904 10/22/22 0933 12/09/22 0932  BILITOT 0.8 0.5 0.8 0.4  AST '22 23 24 21  '$ ALT '24 24 26 20  '$ ALKPHOS 95 85 87 71  PROT 7.8 6.8 7.4 5.5*  ALBUMIN 4.4 4.0 3.9 3.3*    Assessment and Plan:  Follow-up MRI of the abdomen was performed at [place] on 12/18/2022.  Initially, he had tried to be imaged at [facility] on a large bore MRI due to prior history of not being able to tolerate a standard bore MRI scanner due to anxiety when placed in a tighter bore MRI scanner.  He has never been scanned after administration of oral sedative medication.  I reviewed the follow-up MRI performed at [facility] which demonstrates a stable to slightly smaller left lateral cortical interpolar ablation defect of the kidney without evidence of enhancement to suggest recurrent carcinoma.  There also remains no evidence of convincing recurrence of the deeper adjacent renal sinus carcinoma while he has been on Keytruda.  The open MRI imaging is somewhat more limited for detailed abdominal imaging compared to a standard MRI.  The retroperitoneal metastatic lymph node located posterior to the duodenum appears slightly smaller in size measuring approximately 1.2 cm in  short axis compared to 1.7 cm on the prior MRI last September.  No definite new metastatic disease is identified in the abdomen.  I recommended a follow-up MRI with and without contrast in 1 year.  Imaging sooner than 1 year is certainly fine if Dr. Julien Nordmann requests imaging earlier.  I told him that he may be able to tolerate imaging in the larger bore MRI at [facility] with a sedative such as Xanax 30 minutes prior, and immediately prior to imaging.   Electronically Signed: Azzie Roup 01/03/2023, 8:50 AM    I spent a total of 15 Minutes in remote  clinical consultation, greater than 50% of which was counseling/coordinating care for left renal carcinoma status post cryoablation.    Visit type: Audio only (telephone). Audio (no video) only due to patient's lack of internet/smartphone capability. Alternative for in-person consultation at [facility], [address] [place], [place], [place]. This visit type was conducted due to national recommendations for restrictions regarding the COVID-19 Pandemic (e.g. social distancing).  This format is felt to be most appropriate for this patient at this time.  All issues noted in this document were discussed and addressed.

## 2023-01-11 NOTE — Progress Notes (Unsigned)
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   PRIOR THERAPY: 1) status post right nephrectomy on August 27, 2019 in [place] and the final pathology showed clear-cell renal cell carcinoma nuclear grade 3 with 10% necrosis and negative margin with the final pathologic stage of T2a, NX. 2) status post treatment with Keytruda and axitinib started March 2021 for 2 cycles interrupted because of autoimmune hepatitis.  3) axitinib 5 mg daily started July 05, 2020 discontinued after 1 week because of elevated LFTs. 4) status post cryoablation of left renal mass on May 30, 2021.  CURRENT THERAPY: Keytruda 200 Mg IV every 3 weeks started July 27, 2021 and then switch it to 400 Mg every 6 weeks on November 09, 2021.   INTERVAL HISTORY: Michael [name] 57 y.o. male returns to the clinic today for a follow-up visit.  The patient is currently undergoing immunotherapy with Keytruda IV every 6 weeks.  He tolerates this well without any concerning adverse side effects except for dry mouth and some dry skin on his legs.  Says salt water rinses and Biotene.  He also tries to drink plenty of fluid.  He also has some thyroid dysfunction for which he takes Synthroid.  Mentions he has been gaining weight recently which may be attributed to worsening hypothyroidism based on his labs from 6 weeks ago.  Denies any fever, chills, night sweats, or unexplained weight loss.  Denies any chest pain, shortness of breath, cough, or hemoptysis. His is active and plays tennis a few times per week. Denies any headaches or visual changes.  He denies any back or abdominal pain.  Denies any dysuria, malodorous urine, or hematuria.  In the interval since last being seen, the patient had a follow-up with radiology who  recommended follow-up MRI in 1 year.  He denies any vomiting, diarrhea, or constipation.  Sometimes he may have nausea if he takes his morning medications on an empty stomach.  He is here today for evaluation and repeat blood work before undergoing cycle #21.   MEDICAL HISTORY: Past Medical History:  Diagnosis Date   Cancer [facility])    Renal cell carcinoma   Hypertension    Hypothyroidism    Sleep apnea    wears cpap    ALLERGIES:  is allergic to clindamycin, penicillins, and tape.  MEDICATIONS:  Current Outpatient Medications  Medication Sig Dispense Refill   amLODipine (NORVASC) 5 MG tablet Take 7.5 mg by mouth in the morning.     cholecalciferol (VITAMIN D3) 25 MCG (1000 UNIT) tablet Take 1,000 Units by mouth in the morning.     levothyroxine (SYNTHROID) 137 MCG tablet Take 1 tablet (137 mcg total) by mouth daily before breakfast. 30 tablet 2   montelukast (SINGULAIR) 10 MG tablet Take 10 mg by mouth in the morning.     sodium chloride 0.9 % SOLN 50 mL with pembrolizumab 100 MG/4ML SOLN 200 mg Inject 200 mg into the vein every 21 ( twenty-one) days.     tadalafil (CIALIS) 20 MG tablet Take 10-20 mg by mouth daily as needed for erectile dysfunction.     No current facility-administered medications for this visit.    SURGICAL HISTORY:  Past Surgical History:  Procedure Laterality Date   IR RADIOLOGIST EVAL & MGMT  04/04/2021   no procedures performed  IR RADIOLOGIST EVAL & MGMT  05/08/2021   no procedures performed   IR RADIOLOGIST EVAL & MGMT  08/12/2022   LUNG BIOPSY  2021   RADIOLOGY WITH ANESTHESIA N/A 05/30/2021   Procedure: CT WITH ANESTHESIA CRYOABLATION;  Surgeon: Aletta Edouard, MD;  Location: [facility];  Service: Radiology;  Laterality: N/A;   right nephrectomy Right 08/2019   TONSILLECTOMY      REVIEW OF SYSTEMS:   Review of Systems  Constitutional: Negative for appetite change, chills, fatigue, fever and unexpected weight change.  HENT: Positive for dry mouth.   Negative for mouth sores, nosebleeds, sore throat and trouble swallowing.   Eyes: Negative for eye problems and icterus.  Respiratory: Negative for cough, hemoptysis, shortness of breath and wheezing.   Cardiovascular: Negative for chest pain and leg swelling.  Gastrointestinal: Negative for abdominal pain, constipation, diarrhea, nausea and vomiting.  Genitourinary: Negative for bladder incontinence, difficulty urinating, dysuria, frequency and hematuria.   Musculoskeletal: Negative for back pain, gait problem, neck pain and neck stiffness.  Skin: Positive for mild dry skin on legs.  Negative for itching and rash.  Neurological: Negative for dizziness, extremity weakness, gait problem, headaches, light-headedness and seizures.  Hematological: Negative for adenopathy. Does not bruise/bleed easily.  Psychiatric/Behavioral: Negative for confusion, depression and sleep disturbance. The patient is not nervous/anxious.     PHYSICAL EXAMINATION:  Blood pressure 117/77, pulse (!) 57, temperature 97.7 F (36.5 C), resp. rate 20, weight 256 lb 9.6 oz (116.4 kg), SpO2 100 %.  ECOG PERFORMANCE STATUS: 1  Physical Exam  Constitutional: Oriented to person, place, and time and well-developed, well-nourished, and in no distress. HENT:  Head: Normocephalic and atraumatic.  Mouth/Throat: Oropharynx is clear and moist. No oropharyngeal exudate.  Eyes: Conjunctivae are normal. Right eye exhibits no discharge. Left eye exhibits no discharge. No scleral icterus.  Neck: Normal range of motion. Neck supple.  Cardiovascular: Normal rate, regular rhythm, normal heart sounds and intact distal pulses.   Pulmonary/Chest: Effort normal and breath sounds normal. No respiratory distress. No wheezes. No rales.  Abdominal: Soft. Bowel sounds are normal. Exhibits no distension and no mass. There is no tenderness.  Musculoskeletal: Normal range of motion. Exhibits no edema.  Lymphadenopathy:    No cervical adenopathy.   Neurological: Alert and oriented to person, place, and time. Exhibits normal muscle tone. Gait normal. Coordination normal.  Skin: Skin is warm and dry. No rash noted. Not diaphoretic. No erythema. No pallor.  Psychiatric: Mood, memory and judgment normal.  Vitals reviewed.  LABORATORY DATA: Lab Results  Component Value Date   WBC 7.5 01/14/2023   HGB 13.2 01/14/2023   HCT 40.2 01/14/2023   MCV 85.5 01/14/2023   PLT 289 01/14/2023      Chemistry      Component Value Date/Time   NA 140 01/14/2023 1040   K 4.2 01/14/2023 1040   CL 108 01/14/2023 1040   CO2 25 01/14/2023 1040   BUN 15 01/14/2023 1040   CREATININE 1.25 (H) 01/14/2023 1040      Component Value Date/Time   CALCIUM 8.4 (L) 01/14/2023 1040   ALKPHOS 82 01/14/2023 1040   AST 20 01/14/2023 1040   ALT 17 01/14/2023 1040   BILITOT 0.4 01/14/2023 1040       RADIOGRAPHIC STUDIES:  No results found.   ASSESSMENT/PLAN:  This is a very pleasant 57 year old male with stage IV clear-cell renal cell carcinoma.  This was initially diagnosed with locally advanced disease in 2020.  He had evidence  of pulmonary metastases in 2021.  He is status post right nephrectomy on 10/23/202020 in [place].  The final pathology showed clear-cell renal cell carcinoma nuclear grade 3 with 10% necrosis and negative margins.  The final pathologic stage was T2a, NX.  The patient started treatment with Keytruda and Axitinib that was started in March 2021.  He had 2 cycles of treatment but it was interrupted due to autoimmune hepatitis.  He then started on 5 mg daily of axitinib on 07/05/2020 but this was also discontinued after 1 week due to elevated LFTs.  He underwent cryoablation to the left renal mass on 05/30/2021.  He is currently undergoing treatment with Keytruda.  He was initially on 200 mg IV every 3 weeks on 07/27/2021 but then was switched to 400 mg every 6 weeks starting from 11/09/2021.  Addendum: He is scheduled one week early. We  will reschedule for next week. He does not need provider visit or labs next week. He has been tolerating Keytruda well without any concerning adverse side effects.  Labs reviewed.  Recommend that he continue on the same treatment at the same dose.  In the interval since last being seen, he saw radiology for the MRI of the abdomen recommended follow-up MRI in 1 year.  I will arrange for restaging CT scan of the chest, abdomen, pelvis without contrast prior to his next appointment in 6 weeks.   For his dry mouth, discussed salt water rinses, Biotene, drinking plenty of fluids, and that he may pick up saliva substitutes over-the-counter.  He was advised to use lotion for his dry skin.  I have increased the dose of his Synthroid to 137 mcg  The patient was advised to call immediately if he has any concerning symptoms in the interval. The patient voices understanding of current disease status and treatment options and is in agreement with the current care plan. All questions were answered. The patient knows to call the clinic with any problems, questions or concerns. We can certainly see the patient much sooner if necessary       Orders Placed This Encounter  Procedures   CT CHEST ABDOMEN PELVIS WO CONTRAST    Standing Status:   Future    Standing Expiration Date:   01/14/2024    Order Specific Question:   Preferred imaging location?    Answer:   [facility]    Order Specific Question:   Is Oral Contrast requested for this exam?    Answer:   Yes, Per Radiology protocol    Order Specific Question:   Does the patient have a contrast media/X-ray dye allergy?    Answer:   No     The total time spent in the appointment was 20-29 minutes  Nikeya Maxim L Jolinda Pinkstaff, PA-C 01/14/23

## 2023-01-14 ENCOUNTER — Inpatient Hospital Stay (HOSPITAL_BASED_OUTPATIENT_CLINIC_OR_DEPARTMENT_OTHER): Payer: BC Managed Care – PPO | Admitting: Physician Assistant

## 2023-01-14 ENCOUNTER — Inpatient Hospital Stay: Payer: BC Managed Care – PPO

## 2023-01-14 ENCOUNTER — Inpatient Hospital Stay: Payer: BC Managed Care – PPO | Attending: Oncology

## 2023-01-14 ENCOUNTER — Other Ambulatory Visit: Payer: Self-pay | Admitting: Physician Assistant

## 2023-01-14 VITALS — BP 117/77 | HR 57 | Temp 97.7°F | Resp 20 | Wt 256.6 lb

## 2023-01-14 DIAGNOSIS — Z79620 Long term (current) use of immunosuppressive biologic: Secondary | ICD-10-CM | POA: Insufficient documentation

## 2023-01-14 DIAGNOSIS — Z79890 Hormone replacement therapy (postmenopausal): Secondary | ICD-10-CM | POA: Insufficient documentation

## 2023-01-14 DIAGNOSIS — R682 Dry mouth, unspecified: Secondary | ICD-10-CM | POA: Diagnosis not present

## 2023-01-14 DIAGNOSIS — E039 Hypothyroidism, unspecified: Secondary | ICD-10-CM

## 2023-01-14 DIAGNOSIS — Z5112 Encounter for antineoplastic immunotherapy: Secondary | ICD-10-CM

## 2023-01-14 DIAGNOSIS — L853 Xerosis cutis: Secondary | ICD-10-CM | POA: Diagnosis not present

## 2023-01-14 DIAGNOSIS — C649 Malignant neoplasm of unspecified kidney, except renal pelvis: Secondary | ICD-10-CM

## 2023-01-14 DIAGNOSIS — K754 Autoimmune hepatitis: Secondary | ICD-10-CM | POA: Diagnosis not present

## 2023-01-14 DIAGNOSIS — Z79899 Other long term (current) drug therapy: Secondary | ICD-10-CM | POA: Diagnosis not present

## 2023-01-14 DIAGNOSIS — C7800 Secondary malignant neoplasm of unspecified lung: Secondary | ICD-10-CM | POA: Insufficient documentation

## 2023-01-14 DIAGNOSIS — Z880 Allergy status to penicillin: Secondary | ICD-10-CM | POA: Insufficient documentation

## 2023-01-14 DIAGNOSIS — Z881 Allergy status to other antibiotic agents status: Secondary | ICD-10-CM | POA: Diagnosis not present

## 2023-01-14 DIAGNOSIS — C642 Malignant neoplasm of left kidney, except renal pelvis: Secondary | ICD-10-CM | POA: Insufficient documentation

## 2023-01-14 LAB — CBC WITH DIFFERENTIAL (CANCER CENTER ONLY)
Abs Immature Granulocytes: 0.01 10*3/uL (ref 0.00–0.07)
Basophils Absolute: 0.1 10*3/uL (ref 0.0–0.1)
Basophils Relative: 1 %
Eosinophils Absolute: 1.2 10*3/uL — ABNORMAL HIGH (ref 0.0–0.5)
Eosinophils Relative: 16 %
HCT: 40.2 % (ref 39.0–52.0)
Hemoglobin: 13.2 g/dL (ref 13.0–17.0)
Immature Granulocytes: 0 %
Lymphocytes Relative: 24 %
Lymphs Abs: 1.8 10*3/uL (ref 0.7–4.0)
MCH: 28.1 pg (ref 26.0–34.0)
MCHC: 32.8 g/dL (ref 30.0–36.0)
MCV: 85.5 fL (ref 80.0–100.0)
Monocytes Absolute: 0.6 10*3/uL (ref 0.1–1.0)
Monocytes Relative: 9 %
Neutro Abs: 3.8 10*3/uL (ref 1.7–7.7)
Neutrophils Relative %: 50 %
Platelet Count: 289 10*3/uL (ref 150–400)
RBC: 4.7 MIL/uL (ref 4.22–5.81)
RDW: 13.1 % (ref 11.5–15.5)
WBC Count: 7.5 10*3/uL (ref 4.0–10.5)
nRBC: 0 % (ref 0.0–0.2)

## 2023-01-14 LAB — CMP (CANCER CENTER ONLY)
ALT: 17 U/L (ref 0–44)
AST: 20 U/L (ref 15–41)
Albumin: 3.8 g/dL (ref 3.5–5.0)
Alkaline Phosphatase: 82 U/L (ref 38–126)
Anion gap: 7 (ref 5–15)
BUN: 15 mg/dL (ref 6–20)
CO2: 25 mmol/L (ref 22–32)
Calcium: 8.4 mg/dL — ABNORMAL LOW (ref 8.9–10.3)
Chloride: 108 mmol/L (ref 98–111)
Creatinine: 1.25 mg/dL — ABNORMAL HIGH (ref 0.61–1.24)
GFR, Estimated: >60 mL/min (ref >60)
Glucose, Bld: 104 mg/dL — ABNORMAL HIGH (ref 70–99)
Potassium: 4.2 mmol/L (ref 3.5–5.1)
Sodium: 140 mmol/L (ref 135–145)
Total Bilirubin: 0.4 mg/dL (ref 0.3–1.2)
Total Protein: 6.5 g/dL (ref 6.5–8.1)

## 2023-01-14 LAB — TSH: TSH: 10.395 u[IU]/mL — ABNORMAL HIGH (ref 0.350–4.500)

## 2023-01-14 MED ORDER — SODIUM CHLORIDE 0.9 % IV SOLN: 400.0000 mg | Freq: Once | INTRAVENOUS | Status: DC

## 2023-01-14 MED ORDER — LEVOTHYROXINE SODIUM 137 MCG PO TABS: 137.0000 ug | ORAL_TABLET | Freq: Every day | ORAL | 2 refills | Status: DC

## 2023-01-14 MED ORDER — SODIUM CHLORIDE 0.9 % IV SOLN: Freq: Once | INTRAVENOUS | Status: AC

## 2023-01-14 NOTE — Patient Instructions (Signed)
[facility]   Discharge Instructions: Thank you for choosing [facility] to provide your oncology and hematology care.   If you have a lab appointment with the Cancer Center, please go directly to the Cancer Center and check in at the registration area.   Wear comfortable clothing and clothing appropriate for easy access to any Portacath or PICC line.   We strive to give you quality time with your provider. You may need to reschedule your appointment if you arrive late (15 or more minutes).  Arriving late affects you and other patients whose appointments are after yours.  Also, if you miss three or more appointments without notifying the office, you may be dismissed from the clinic at the provider's discretion.      For prescription refill requests, have your pharmacy contact our office and allow 72 hours for refills to be completed.    Today you received the following chemotherapy and/or immunotherapy agents: Pembrolizumab (Keytruda)       To help prevent nausea and vomiting after your treatment, we encourage you to take your nausea medication as directed.  BELOW ARE SYMPTOMS THAT SHOULD BE REPORTED IMMEDIATELY: *FEVER GREATER THAN 100.4 F (38 C) OR HIGHER *CHILLS OR SWEATING *NAUSEA AND VOMITING THAT IS NOT CONTROLLED WITH YOUR NAUSEA MEDICATION *UNUSUAL SHORTNESS OF BREATH *UNUSUAL BRUISING OR BLEEDING *URINARY PROBLEMS (pain or burning when urinating, or frequent urination) *BOWEL PROBLEMS (unusual diarrhea, constipation, pain near the anus) TENDERNESS IN MOUTH AND THROAT WITH OR WITHOUT PRESENCE OF ULCERS (sore throat, sores in mouth, or a toothache) UNUSUAL RASH, SWELLING OR PAIN  UNUSUAL VAGINAL DISCHARGE OR ITCHING   Items with * indicate a potential emergency and should be followed up as soon as possible or go to the Emergency Department if any problems should occur.  Please show the CHEMOTHERAPY ALERT CARD or IMMUNOTHERAPY  ALERT CARD at check-in to the Emergency Department and triage nurse.  Should you have questions after your visit or need to cancel or reschedule your appointment, please contact [facility]  Dept: [phone]  and follow the prompts.  Office hours are 8:00 a.m. to 4:30 p.m. Monday - Friday. Please note that voicemails left after 4:00 p.m. may not be returned until the following business day.  We are closed weekends and major holidays. You have access to a nurse at all times for urgent questions. Please call the main number to the clinic Dept: [phone] and follow the prompts.   For any non-urgent questions, you may also contact your provider using MyChart. We now offer e-Visits for anyone 18 and older to request care online for non-urgent symptoms. For details visit [email or web address].   Also download the MyChart app! Go to the app store, search "MyChart", open the app, select [facility], and log in with your MyChart username and password.   

## 2023-01-14 NOTE — Progress Notes (Signed)
Patient seen by PA today  Vitals are within treatment parameters.  Labs reviewed: and are within treatment parameters.  Per physician team, patient is ready for treatment and there are NO modifications to the treatment plan.  

## 2023-01-16 LAB — T4: T4, Total: 8.2 ug/dL (ref 4.5–12.0)

## 2023-01-21 ENCOUNTER — Inpatient Hospital Stay: Payer: BC Managed Care – PPO | Admitting: Physician Assistant

## 2023-01-21 ENCOUNTER — Inpatient Hospital Stay: Payer: BC Managed Care – PPO

## 2023-01-21 VITALS — BP 122/83 | HR 55 | Resp 18 | Wt 253.5 lb

## 2023-01-21 DIAGNOSIS — L853 Xerosis cutis: Secondary | ICD-10-CM | POA: Diagnosis not present

## 2023-01-21 DIAGNOSIS — R682 Dry mouth, unspecified: Secondary | ICD-10-CM | POA: Diagnosis not present

## 2023-01-21 DIAGNOSIS — Z881 Allergy status to other antibiotic agents status: Secondary | ICD-10-CM | POA: Diagnosis not present

## 2023-01-21 DIAGNOSIS — C649 Malignant neoplasm of unspecified kidney, except renal pelvis: Secondary | ICD-10-CM

## 2023-01-21 DIAGNOSIS — K754 Autoimmune hepatitis: Secondary | ICD-10-CM | POA: Diagnosis not present

## 2023-01-21 DIAGNOSIS — C7800 Secondary malignant neoplasm of unspecified lung: Secondary | ICD-10-CM | POA: Diagnosis not present

## 2023-01-21 DIAGNOSIS — Z79899 Other long term (current) drug therapy: Secondary | ICD-10-CM | POA: Diagnosis not present

## 2023-01-21 DIAGNOSIS — Z5112 Encounter for antineoplastic immunotherapy: Secondary | ICD-10-CM | POA: Diagnosis not present

## 2023-01-21 DIAGNOSIS — C642 Malignant neoplasm of left kidney, except renal pelvis: Secondary | ICD-10-CM | POA: Diagnosis not present

## 2023-01-21 DIAGNOSIS — Z79890 Hormone replacement therapy (postmenopausal): Secondary | ICD-10-CM | POA: Diagnosis not present

## 2023-01-21 DIAGNOSIS — E039 Hypothyroidism, unspecified: Secondary | ICD-10-CM | POA: Diagnosis not present

## 2023-01-21 DIAGNOSIS — Z79620 Long term (current) use of immunosuppressive biologic: Secondary | ICD-10-CM | POA: Diagnosis not present

## 2023-01-21 DIAGNOSIS — Z880 Allergy status to penicillin: Secondary | ICD-10-CM | POA: Diagnosis not present

## 2023-01-21 MED ORDER — SODIUM CHLORIDE 0.9 % IV SOLN: Freq: Once | INTRAVENOUS | Status: AC

## 2023-01-21 MED ORDER — SODIUM CHLORIDE 0.9 % IV SOLN
400.0000 mg | Freq: Once | INTRAVENOUS | Status: AC
  Administered 2023-01-21: 400 mg via INTRAVENOUS
  Filled 2023-01-21: qty 16

## 2023-01-24 ENCOUNTER — Other Ambulatory Visit: Payer: Self-pay | Admitting: Oncology

## 2023-02-04 ENCOUNTER — Other Ambulatory Visit: Payer: Self-pay

## 2023-02-08 ENCOUNTER — Other Ambulatory Visit: Payer: Self-pay

## 2023-02-14 ENCOUNTER — Other Ambulatory Visit: Payer: Self-pay | Admitting: Hematology and Oncology

## 2023-02-14 ENCOUNTER — Encounter: Payer: Self-pay | Admitting: Internal Medicine

## 2023-02-14 NOTE — Progress Notes (Signed)
OFF PATHWAY REGIMEN - Renal Cell  No Change  Continue With Treatment as Ordered.  Original Decision Date/Time: 07/06/2021 14:00   [id number]:Pembrolizumab 200 mg IV D1 q21 Days:   A cycle is every 21 days:     Pembrolizumab   **Always confirm dose/schedule in your pharmacy ordering system**  Patient Characteristics: Stage IV (Unresected T4M0 or Any T, M1)/Metastatic Disease, Clear Cell, Second Line, No Prior Checkpoint Inhibitor Therapeutic Status: Stage IV (Unresected T4M0 or Any T, M1)/Metastatic Disease Histology: Clear Cell Line of Therapy: Second Line Intent of Therapy: Non-Curative / Palliative Intent, Discussed with Patient

## 2023-02-15 ENCOUNTER — Other Ambulatory Visit: Payer: Self-pay | Admitting: Internal Medicine

## 2023-02-15 DIAGNOSIS — C649 Malignant neoplasm of unspecified kidney, except renal pelvis: Secondary | ICD-10-CM

## 2023-02-16 ENCOUNTER — Other Ambulatory Visit: Payer: Self-pay

## 2023-02-20 ENCOUNTER — Telehealth: Payer: Self-pay | Admitting: Internal Medicine

## 2023-02-20 NOTE — Telephone Encounter (Signed)
Called patient regarding upcoming April/June appointments, patient is notified.

## 2023-02-21 ENCOUNTER — Ambulatory Visit (HOSPITAL_COMMUNITY): Payer: BC Managed Care – PPO

## 2023-02-27 ENCOUNTER — Ambulatory Visit (HOSPITAL_COMMUNITY)
Admission: RE | Admit: 2023-02-27 | Discharge: 2023-02-27 | Disposition: A | Discharge status: Home / Self Care | Payer: BC Managed Care – PPO | Source: Ambulatory Visit | Attending: Physician Assistant | Admitting: Physician Assistant

## 2023-02-27 DIAGNOSIS — K7689 Other specified diseases of liver: Secondary | ICD-10-CM | POA: Diagnosis not present

## 2023-02-27 DIAGNOSIS — R918 Other nonspecific abnormal finding of lung field: Secondary | ICD-10-CM | POA: Diagnosis not present

## 2023-02-27 DIAGNOSIS — C649 Malignant neoplasm of unspecified kidney, except renal pelvis: Secondary | ICD-10-CM | POA: Diagnosis not present

## 2023-02-28 NOTE — Progress Notes (Unsigned)
[facility] OFFICE PROGRESS NOTE  Michael Corn, MD [address] [place] [address]  DIAGNOSIS: Stage IV clear-cell renal cell carcinoma initially diagnosed as locally advanced disease in 2020 with evidence of pulmonary metastasis in 2021.   PRIOR THERAPY: 1) status post right nephrectomy on August 27, 2019 in [place] and the final pathology showed clear-cell renal cell carcinoma nuclear grade 3 with 10% necrosis and negative margin with the final pathologic stage of T2a, NX. 2) status post treatment with Keytruda and axitinib started March 2021 for 2 cycles interrupted because of autoimmune hepatitis.  3) axitinib 5 mg daily started July 05, 2020 discontinued after 1 week because of elevated LFTs. 4) status post cryoablation of left renal mass on May 30, 2021.  CURRENT THERAPY:  Keytruda 200 Mg IV every 3 weeks started July 27, 2021 and then switch it to 400 Mg every 6 weeks on November 09, 2021.   INTERVAL HISTORY: Michael [name] 57 y.o. male returns to the clinic today for a follow-up visit.  The patient is currently undergoing immunotherapy with Keytruda IV every 6 weeks.  He tolerates this well without any concerning adverse side effects except at his last appointment he did mention dry mouth and some dry skin on his legs.  He also has some thyroid dysfunction for which he takes Synthroid. Denies any fever, chills, night sweats, or unexplained weight loss. He has intentionally been trying to lose weight with exercising and changing his dietary habits.  Denies any chest pain, shortness of breath, cough, or hemoptysis. His is active and plays tennis and golf. Denies any headaches or visual changes.  He denies any back or abdominal pain.  Denies any dysuria, malodorous urine, or hematuria.   He denies any nausea, vomiting, diarrhea, or constipation.  He recently had a restaging CT scan performed.  He is here today for evaluation and to review his scan results before  undergoing cycle #22.   MEDICAL HISTORY: Past Medical History:  Diagnosis Date   Cancer [facility])    Renal cell carcinoma   Hypertension    Hypothyroidism    Sleep apnea    wears cpap    ALLERGIES:  is allergic to clindamycin, penicillins, and tape.  MEDICATIONS:  Current Outpatient Medications  Medication Sig Dispense Refill   amLODipine (NORVASC) 5 MG tablet Take 7.5 mg by mouth in the morning.     cholecalciferol (VITAMIN D3) 25 MCG (1000 UNIT) tablet Take 1,000 Units by mouth in the morning.     levothyroxine (SYNTHROID) 137 MCG tablet Take 1 tablet (137 mcg total) by mouth daily before breakfast. 30 tablet 2   montelukast (SINGULAIR) 10 MG tablet Take 10 mg by mouth in the morning.     sodium chloride 0.9 % SOLN 50 mL with pembrolizumab 100 MG/4ML SOLN 200 mg Inject 200 mg into the vein every 21 ( twenty-one) days.     tadalafil (CIALIS) 20 MG tablet Take 10-20 mg by mouth daily as needed for erectile dysfunction.     No current facility-administered medications for this visit.    SURGICAL HISTORY:  Past Surgical History:  Procedure Laterality Date   IR RADIOLOGIST EVAL & MGMT  04/04/2021   no procedures performed   IR RADIOLOGIST EVAL & MGMT  05/08/2021   no procedures performed   IR RADIOLOGIST EVAL & MGMT  08/12/2022   LUNG BIOPSY  2021   RADIOLOGY WITH ANESTHESIA N/A 05/30/2021   Procedure: CT WITH ANESTHESIA CRYOABLATION;  Surgeon: Irish Lack,  MD;  Location: [facility];  Service: Radiology;  Laterality: N/A;   right nephrectomy Right 08/2019   TONSILLECTOMY      REVIEW OF SYSTEMS:   Constitutional: Negative for appetite change, chills, fatigue, fever and unexpected weight change.  HENT: Positive for dry mouth.  Negative for mouth sores, nosebleeds, sore throat and trouble swallowing.   Eyes: Negative for eye problems and icterus.  Respiratory: Negative for cough, hemoptysis, shortness of breath and wheezing.   Cardiovascular: Negative for chest pain and leg  swelling.  Gastrointestinal: Negative for abdominal pain, constipation, diarrhea, nausea and vomiting.  Genitourinary: Negative for bladder incontinence, difficulty urinating, dysuria, frequency and hematuria.   Musculoskeletal: Negative for back pain, gait problem, neck pain and neck stiffness.  Skin: Positive for mild dry skin on legs.  Negative for itching and rash.  Neurological: Negative for dizziness, extremity weakness, gait problem, headaches, light-headedness and seizures.  Hematological: Negative for adenopathy. Does not bruise/bleed easily.  Psychiatric/Behavioral: Negative for confusion, depression and sleep disturbance. The patient is not nervous/anxious.    PHYSICAL EXAMINATION:  Blood pressure 115/87, pulse (!) 58, temperature (!) 97.3 F (36.3 C), resp. rate 20, weight 248 lb 1.6 oz (112.5 kg), SpO2 99 %.  ECOG PERFORMANCE STATUS: 1  Physical Exam  Constitutional: Oriented to person, place, and time and well-developed, well-nourished, and in no distress.  HENT:  Head: Normocephalic and atraumatic.  Mouth/Throat: Oropharynx is clear and moist. No oropharyngeal exudate.  Eyes: Conjunctivae are normal. Right eye exhibits no discharge. Left eye exhibits no discharge. No scleral icterus.  Neck: Normal range of motion. Neck supple.  Cardiovascular: Normal rate, regular rhythm, normal heart sounds and intact distal pulses.   Pulmonary/Chest: Effort normal and breath sounds normal. No respiratory distress. No wheezes. No rales.  Abdominal: Soft. Bowel sounds are normal. Exhibits no distension and no mass. There is no tenderness.  Musculoskeletal: Normal range of motion. Exhibits no edema.  Lymphadenopathy:    No cervical adenopathy.  Neurological: Alert and oriented to person, place, and time. Exhibits normal muscle tone. Gait normal. Coordination normal.  Skin: Skin is warm and dry. No rash noted. Not diaphoretic. No erythema. No pallor.  Psychiatric: Mood, memory and judgment  normal.  Vitals reviewed.  LABORATORY DATA: Lab Results  Component Value Date   WBC 9.1 03/04/2023   HGB 12.0 (L) 03/04/2023   HCT 37.4 (L) 03/04/2023   MCV 81.8 03/04/2023   PLT 323 03/04/2023      Chemistry      Component Value Date/Time   NA 140 03/04/2023 0846   K 3.9 03/04/2023 0846   CL 109 03/04/2023 0846   CO2 25 03/04/2023 0846   BUN 15 03/04/2023 0846   CREATININE 1.41 (H) 03/04/2023 0846      Component Value Date/Time   CALCIUM 8.8 (L) 03/04/2023 0846   ALKPHOS 91 03/04/2023 0846   AST 21 03/04/2023 0846   ALT 15 03/04/2023 0846   BILITOT 0.4 03/04/2023 0846       RADIOGRAPHIC STUDIES:  CT CHEST ABDOMEN PELVIS WO CONTRAST  Result Date: 03/02/2023 CLINICAL DATA:  Renal cell carcinoma. Evaluate metastasis. RIGHT nephrectomy anatomy. Post LEFT ablation. Immunotherapy ongoing. Non IV contrast exam. * Tracking Code: BO * EXAM: CT CHEST, ABDOMEN AND PELVIS WITHOUT CONTRAST TECHNIQUE: Multidetector CT imaging of the chest, abdomen and pelvis was performed following the standard protocol without IV contrast. RADIATION DOSE REDUCTION: This exam was performed according to the departmental dose-optimization program which includes automated exposure control, adjustment of  the mA and/or kV according to patient size and/or use of iterative reconstruction technique. COMPARISON:  CT 04/24/2022, MRI 07/21/2022, chest CT 11/12/2022 FINDINGS: CT CHEST FINDINGS Cardiovascular: No significant vascular findings. Normal heart size. No pericardial effusion. Mediastinum/Nodes: No axillary or supraclavicular adenopathy. No mediastinal or hilar adenopathy. No pericardial fluid. Esophagus normal. Lungs/Pleura: RIGHT lobe pulmonary nodule measuring 4 mm (image 67/4) not changed from 5 mm. LEFT lobe nodule measuring 6 mm (93/4) is not present on prior (04/24/2022). Interstitial nodular thickening inferior to this nodule (image 96/series 4 suggest potential inflammatory infectious process. More  recent CT scan (11/12/2022) demonstrates a cluster of nodules in the LEFT lower lobe at same location Diffuse ground-glass densities throughout the lungs are increased suggesting atelectasis. Musculoskeletal: No aggressive osseous lesion. CT ABDOMEN AND PELVIS FINDINGS Hepatobiliary: Several hepatic cysts noted on noncontrast exam unchanged. No new hepatic lesions. Gallbladder normal. Periportal lymph node described on comparison MRI measures 10 mm (55/2 is less prominent on recent MRI. No new periportal adenopathy. Pancreas: Pancreas is normal. No ductal dilatation. No pancreatic inflammation. Spleen: Normal spleen Adrenals/urinary tract: Adrenal glands normal. Post RIGHT nephrectomy. No nodularity in the RIGHT nephrectomy bed. Bill a shin site in the mid LEFT kidney is unchanged. No interval growth noted in the LEFT kidney on noncontrast exam. LEFT ureter and bladder normal. Stomach/Bowel: Stomach, small bowel, appendix, and cecum are normal. The colon and rectosigmoid colon are normal. Vascular/Lymphatic: Abdominal aorta is normal caliber. There is no retroperitoneal or periportal lymphadenopathy. No pelvic lymphadenopathy. Reproductive: Prostate unremarkable Other: No free fluid. Musculoskeletal: No aggressive osseous lesion. IMPRESSION: CHEST IMPRESSION: 1. Stable RIGHT lower lobe pulmonary nodule. 2. Persistent LEFT lower lobe pulmonary nodule with adjacent interstitial thickening. Favor infectious or inflammatory process. Recommend continued attention on surveillance exams. 3. No mediastinal adenopathy. PELVIS IMPRESSION: 1. No evidence of local renal cell carcinoma recurrence in the RIGHT nephrectomy bed. 2. Periportal lymph node described on comparison MRI is less prominent. 3. No evidence of metastatic disease in the abdomen pelvis. Electronically Signed   By: Genevive Bi M.D.   On: 03/02/2023 12:05     ASSESSMENT/PLAN:  This is a very pleasant 57 year old male with stage IV clear-cell renal cell  carcinoma.  This was initially diagnosed with locally advanced disease in 2020.  He had evidence of pulmonary metastases in 2021.  He is status post right nephrectomy on 10/23/202020 in [place].  The final pathology showed clear-cell renal cell carcinoma nuclear grade 3 with 10% necrosis and negative margins.  The final pathologic stage was T2a, NX.   The patient started treatment with Keytruda and Axitinib that was started in March 2021.  He had 2 cycles of treatment but it was interrupted due to autoimmune hepatitis.  He then started on 5 mg daily of axitinib on 07/05/2020 but this was also discontinued after 1 week due to elevated LFTs.   He underwent cryoablation to the left renal mass on 05/30/2021.   He is currently undergoing treatment with Keytruda.  He was initially on 200 mg IV every 3 weeks on 07/27/2021 but then was switched to 400 mg every 6 weeks starting from 11/09/2021.  The patient was seen with Dr. Arbutus Ped today.  The patient recently had a restaging CT scan performed.  Dr. Arbutus Ped personally independently reviewed the scan discussed results with the patient today.  The scan showed no evidence of disease progression.  Dr. Arbutus Ped recommends that he continue with cycle number #22 as scheduled.   He has been tolerating Michael [name]  well without any concerning adverse side effects.  Labs reviewed.  Recommend that he continue on the same treatment at the same dose.   In the interval since last being seen, he saw radiology for the MRI of the abdomen recommended follow-up MRI in 1 year.   The patient was advised to call immediately if he has any concerning symptoms in the interval. The patient voices understanding of current disease status and treatment options and is in agreement with the current care plan. All questions were answered. The patient knows to call the clinic with any problems, questions or concerns. We can certainly see the patient much sooner if necessary    No orders of the  defined types were placed in this encounter.    Michael Tubby L Ashely Joshua, PA-C 03/04/23  ADDENDUM: Hematology/Oncology Attending: I had a face-to-face encounter with the patient today.  I reviewed his record, lab, scan and recommended his care plan.  This is a very pleasant 57 years old male with a stage IV clear-cell renal cell carcinoma initially diagnosed as locally advanced disease in 2020 with evidence of pulmonary metastasis in 2021.  He status post right radical nephrectomy followed by posttreatment with Keytruda and axitinib for 2 cycles discontinued because of autoimmune hepatitis.  The patient was then treated with axitinib 5 mg p.o. daily discontinued secondary to elevated LFTs.  He also underwent cryoablation of left renal mass in July 2022.  He has been on treatment with single agent Keytruda since September 2022 and he has been tolerating the treatment fairly well.  He is currently on every 6 weeks schedule and tolerating the treatment well. He had repeat CT scan of the chest, abdomen and pelvis performed recently.  I personally and independently reviewed the scan and discussed the result with the patient today. His scan showed no concerning findings for disease progression. I recommended for him to continue his current treatment with the same regimen until September 2024 we may consider giving the patient a break of treatment with close monitoring if he has no evidence for disease progression. The patient was advised to call immediately if he has any other concerning symptoms in the interval. The total time spent in the appointment was 30 minutes. Disclaimer: This note was dictated with voice recognition software. Similar sounding words can inadvertently be transcribed and may be missed upon review. Lajuana Matte, MD

## 2023-03-03 ENCOUNTER — Other Ambulatory Visit: Payer: Self-pay

## 2023-03-04 ENCOUNTER — Inpatient Hospital Stay: Payer: BC Managed Care – PPO

## 2023-03-04 ENCOUNTER — Ambulatory Visit: Payer: BC Managed Care – PPO | Admitting: Physician Assistant

## 2023-03-04 ENCOUNTER — Inpatient Hospital Stay: Payer: BC Managed Care – PPO | Attending: Oncology

## 2023-03-04 ENCOUNTER — Other Ambulatory Visit: Payer: Self-pay

## 2023-03-04 ENCOUNTER — Other Ambulatory Visit: Payer: BC Managed Care – PPO

## 2023-03-04 ENCOUNTER — Ambulatory Visit: Payer: BC Managed Care – PPO

## 2023-03-04 ENCOUNTER — Inpatient Hospital Stay (HOSPITAL_BASED_OUTPATIENT_CLINIC_OR_DEPARTMENT_OTHER): Payer: BC Managed Care – PPO | Admitting: Physician Assistant

## 2023-03-04 VITALS — BP 115/87 | HR 58 | Temp 97.3°F | Resp 20 | Wt 248.1 lb

## 2023-03-04 DIAGNOSIS — C642 Malignant neoplasm of left kidney, except renal pelvis: Secondary | ICD-10-CM | POA: Insufficient documentation

## 2023-03-04 DIAGNOSIS — K7689 Other specified diseases of liver: Secondary | ICD-10-CM | POA: Insufficient documentation

## 2023-03-04 DIAGNOSIS — R682 Dry mouth, unspecified: Secondary | ICD-10-CM | POA: Diagnosis not present

## 2023-03-04 DIAGNOSIS — C7800 Secondary malignant neoplasm of unspecified lung: Secondary | ICD-10-CM | POA: Insufficient documentation

## 2023-03-04 DIAGNOSIS — Z79890 Hormone replacement therapy (postmenopausal): Secondary | ICD-10-CM | POA: Insufficient documentation

## 2023-03-04 DIAGNOSIS — Z880 Allergy status to penicillin: Secondary | ICD-10-CM | POA: Diagnosis not present

## 2023-03-04 DIAGNOSIS — E039 Hypothyroidism, unspecified: Secondary | ICD-10-CM | POA: Diagnosis not present

## 2023-03-04 DIAGNOSIS — Z881 Allergy status to other antibiotic agents status: Secondary | ICD-10-CM | POA: Insufficient documentation

## 2023-03-04 DIAGNOSIS — C649 Malignant neoplasm of unspecified kidney, except renal pelvis: Secondary | ICD-10-CM | POA: Diagnosis not present

## 2023-03-04 DIAGNOSIS — K754 Autoimmune hepatitis: Secondary | ICD-10-CM | POA: Insufficient documentation

## 2023-03-04 DIAGNOSIS — Z5112 Encounter for antineoplastic immunotherapy: Secondary | ICD-10-CM | POA: Diagnosis not present

## 2023-03-04 DIAGNOSIS — Z905 Acquired absence of kidney: Secondary | ICD-10-CM | POA: Insufficient documentation

## 2023-03-04 DIAGNOSIS — Z79620 Long term (current) use of immunosuppressive biologic: Secondary | ICD-10-CM | POA: Insufficient documentation

## 2023-03-04 LAB — CMP (CANCER CENTER ONLY)
ALT: 15 U/L (ref 0–44)
AST: 21 U/L (ref 15–41)
Albumin: 3.9 g/dL (ref 3.5–5.0)
Alkaline Phosphatase: 91 U/L (ref 38–126)
Anion gap: 6 (ref 5–15)
BUN: 15 mg/dL (ref 6–20)
CO2: 25 mmol/L (ref 22–32)
Calcium: 8.8 mg/dL — ABNORMAL LOW (ref 8.9–10.3)
Chloride: 109 mmol/L (ref 98–111)
Creatinine: 1.41 mg/dL — ABNORMAL HIGH (ref 0.61–1.24)
GFR, Estimated: 59 mL/min — ABNORMAL LOW (ref >60)
Glucose, Bld: 110 mg/dL — ABNORMAL HIGH (ref 70–99)
Potassium: 3.9 mmol/L (ref 3.5–5.1)
Sodium: 140 mmol/L (ref 135–145)
Total Bilirubin: 0.4 mg/dL (ref 0.3–1.2)
Total Protein: 6.4 g/dL — ABNORMAL LOW (ref 6.5–8.1)

## 2023-03-04 LAB — CBC WITH DIFFERENTIAL (CANCER CENTER ONLY)
Abs Immature Granulocytes: 0.02 10*3/uL (ref 0.00–0.07)
Basophils Absolute: 0.1 10*3/uL (ref 0.0–0.1)
Basophils Relative: 1 %
Eosinophils Absolute: 2.5 10*3/uL — ABNORMAL HIGH (ref 0.0–0.5)
Eosinophils Relative: 28 %
HCT: 37.4 % — ABNORMAL LOW (ref 39.0–52.0)
Hemoglobin: 12 g/dL — ABNORMAL LOW (ref 13.0–17.0)
Immature Granulocytes: 0 %
Lymphocytes Relative: 20 %
Lymphs Abs: 1.8 10*3/uL (ref 0.7–4.0)
MCH: 26.3 pg (ref 26.0–34.0)
MCHC: 32.1 g/dL (ref 30.0–36.0)
MCV: 81.8 fL (ref 80.0–100.0)
Monocytes Absolute: 0.7 10*3/uL (ref 0.1–1.0)
Monocytes Relative: 8 %
Neutro Abs: 3.9 10*3/uL (ref 1.7–7.7)
Neutrophils Relative %: 43 %
Platelet Count: 323 10*3/uL (ref 150–400)
RBC: 4.57 MIL/uL (ref 4.22–5.81)
RDW: 13.5 % (ref 11.5–15.5)
WBC Count: 9.1 10*3/uL (ref 4.0–10.5)
nRBC: 0 % (ref 0.0–0.2)

## 2023-03-04 LAB — TSH: TSH: 6.094 u[IU]/mL — ABNORMAL HIGH (ref 0.350–4.500)

## 2023-03-04 MED ORDER — SODIUM CHLORIDE 0.9 % IV SOLN
400.0000 mg | Freq: Once | INTRAVENOUS | Status: AC
  Administered 2023-03-04: 400 mg via INTRAVENOUS
  Filled 2023-03-04: qty 16

## 2023-03-04 MED ORDER — SODIUM CHLORIDE 0.9 % IV SOLN: Freq: Once | INTRAVENOUS | Status: AC

## 2023-03-04 NOTE — Progress Notes (Signed)
Patient seen by Cassie Heilingoetter, PA-C  Vitals are within treatment parameters.  Labs reviewed: and are within treatment parameters.  Per physician team, patient is ready for treatment and there are NO modifications to the treatment plan.  

## 2023-03-04 NOTE — Patient Instructions (Signed)
[facility]   Discharge Instructions: Thank you for choosing [facility] to provide your oncology and hematology care.   If you have a lab appointment with the Cancer Center, please go directly to the Cancer Center and check in at the registration area.   Wear comfortable clothing and clothing appropriate for easy access to any Portacath or PICC line.   We strive to give you quality time with your provider. You may need to reschedule your appointment if you arrive late (15 or more minutes).  Arriving late affects you and other patients whose appointments are after yours.  Also, if you miss three or more appointments without notifying the office, you may be dismissed from the clinic at the provider's discretion.      For prescription refill requests, have your pharmacy contact our office and allow 72 hours for refills to be completed.    Today you received the following chemotherapy and/or immunotherapy agents: Pembrolizumab (Keytruda)       To help prevent nausea and vomiting after your treatment, we encourage you to take your nausea medication as directed.  BELOW ARE SYMPTOMS THAT SHOULD BE REPORTED IMMEDIATELY: *FEVER GREATER THAN 100.4 F (38 C) OR HIGHER *CHILLS OR SWEATING *NAUSEA AND VOMITING THAT IS NOT CONTROLLED WITH YOUR NAUSEA MEDICATION *UNUSUAL SHORTNESS OF BREATH *UNUSUAL BRUISING OR BLEEDING *URINARY PROBLEMS (pain or burning when urinating, or frequent urination) *BOWEL PROBLEMS (unusual diarrhea, constipation, pain near the anus) TENDERNESS IN MOUTH AND THROAT WITH OR WITHOUT PRESENCE OF ULCERS (sore throat, sores in mouth, or a toothache) UNUSUAL RASH, SWELLING OR PAIN  UNUSUAL VAGINAL DISCHARGE OR ITCHING   Items with * indicate a potential emergency and should be followed up as soon as possible or go to the Emergency Department if any problems should occur.  Please show the CHEMOTHERAPY ALERT CARD or IMMUNOTHERAPY  ALERT CARD at check-in to the Emergency Department and triage nurse.  Should you have questions after your visit or need to cancel or reschedule your appointment, please contact [facility]  Dept: [phone]  and follow the prompts.  Office hours are 8:00 a.m. to 4:30 p.m. Monday - Friday. Please note that voicemails left after 4:00 p.m. may not be returned until the following business day.  We are closed weekends and major holidays. You have access to a nurse at all times for urgent questions. Please call the main number to the clinic Dept: [phone] and follow the prompts.   For any non-urgent questions, you may also contact your provider using MyChart. We now offer e-Visits for anyone 18 and older to request care online for non-urgent symptoms. For details visit [email or web address].   Also download the MyChart app! Go to the app store, search "MyChart", open the app, select [facility], and log in with your MyChart username and password.   

## 2023-03-06 LAB — T4: T4, Total: 8.2 ug/dL (ref 4.5–12.0)

## 2023-04-01 ENCOUNTER — Other Ambulatory Visit: Payer: Self-pay

## 2023-04-14 ENCOUNTER — Telehealth: Payer: Self-pay | Admitting: Internal Medicine

## 2023-04-15 ENCOUNTER — Other Ambulatory Visit: Payer: BC Managed Care – PPO

## 2023-04-15 ENCOUNTER — Other Ambulatory Visit: Payer: Self-pay

## 2023-04-15 ENCOUNTER — Ambulatory Visit: Payer: BC Managed Care – PPO | Admitting: Internal Medicine

## 2023-04-15 ENCOUNTER — Ambulatory Visit: Payer: BC Managed Care – PPO

## 2023-04-17 ENCOUNTER — Other Ambulatory Visit: Payer: Self-pay

## 2023-04-20 ENCOUNTER — Other Ambulatory Visit: Payer: Self-pay | Admitting: Physician Assistant

## 2023-04-20 DIAGNOSIS — E039 Hypothyroidism, unspecified: Secondary | ICD-10-CM

## 2023-04-22 ENCOUNTER — Inpatient Hospital Stay: Payer: BC Managed Care – PPO

## 2023-04-22 ENCOUNTER — Encounter: Payer: Self-pay | Admitting: Internal Medicine

## 2023-04-22 ENCOUNTER — Inpatient Hospital Stay (HOSPITAL_BASED_OUTPATIENT_CLINIC_OR_DEPARTMENT_OTHER): Payer: BC Managed Care – PPO | Admitting: Internal Medicine

## 2023-04-22 ENCOUNTER — Inpatient Hospital Stay: Payer: BC Managed Care – PPO | Attending: Oncology

## 2023-04-22 ENCOUNTER — Other Ambulatory Visit: Payer: Self-pay

## 2023-04-22 VITALS — BP 136/84 | HR 60 | Temp 98.0°F | Resp 18

## 2023-04-22 DIAGNOSIS — K754 Autoimmune hepatitis: Secondary | ICD-10-CM | POA: Diagnosis not present

## 2023-04-22 DIAGNOSIS — R682 Dry mouth, unspecified: Secondary | ICD-10-CM | POA: Diagnosis not present

## 2023-04-22 DIAGNOSIS — C7800 Secondary malignant neoplasm of unspecified lung: Secondary | ICD-10-CM | POA: Diagnosis not present

## 2023-04-22 DIAGNOSIS — Z880 Allergy status to penicillin: Secondary | ICD-10-CM | POA: Diagnosis not present

## 2023-04-22 DIAGNOSIS — Z79899 Other long term (current) drug therapy: Secondary | ICD-10-CM | POA: Insufficient documentation

## 2023-04-22 DIAGNOSIS — Z905 Acquired absence of kidney: Secondary | ICD-10-CM | POA: Diagnosis not present

## 2023-04-22 DIAGNOSIS — Z881 Allergy status to other antibiotic agents status: Secondary | ICD-10-CM | POA: Insufficient documentation

## 2023-04-22 DIAGNOSIS — C649 Malignant neoplasm of unspecified kidney, except renal pelvis: Secondary | ICD-10-CM

## 2023-04-22 DIAGNOSIS — C642 Malignant neoplasm of left kidney, except renal pelvis: Secondary | ICD-10-CM | POA: Insufficient documentation

## 2023-04-22 DIAGNOSIS — Z79620 Long term (current) use of immunosuppressive biologic: Secondary | ICD-10-CM | POA: Diagnosis not present

## 2023-04-22 DIAGNOSIS — Z5112 Encounter for antineoplastic immunotherapy: Secondary | ICD-10-CM | POA: Insufficient documentation

## 2023-04-22 LAB — CMP (CANCER CENTER ONLY)
ALT: 17 U/L (ref 0–44)
AST: 19 U/L (ref 15–41)
Albumin: 3.9 g/dL (ref 3.5–5.0)
Alkaline Phosphatase: 107 U/L (ref 38–126)
Anion gap: 6 (ref 5–15)
BUN: 14 mg/dL (ref 6–20)
CO2: 25 mmol/L (ref 22–32)
Calcium: 8.9 mg/dL (ref 8.9–10.3)
Chloride: 108 mmol/L (ref 98–111)
Creatinine: 1.18 mg/dL (ref 0.61–1.24)
GFR, Estimated: >60 mL/min (ref >60)
Glucose, Bld: 104 mg/dL — ABNORMAL HIGH (ref 70–99)
Potassium: 4.1 mmol/L (ref 3.5–5.1)
Sodium: 139 mmol/L (ref 135–145)
Total Bilirubin: 0.5 mg/dL (ref 0.3–1.2)
Total Protein: 6.5 g/dL (ref 6.5–8.1)

## 2023-04-22 LAB — CBC WITH DIFFERENTIAL (CANCER CENTER ONLY)
Abs Immature Granulocytes: 0.02 10*3/uL (ref 0.00–0.07)
Basophils Absolute: 0.1 10*3/uL (ref 0.0–0.1)
Basophils Relative: 1 %
Eosinophils Absolute: 3.3 10*3/uL — ABNORMAL HIGH (ref 0.0–0.5)
Eosinophils Relative: 33 %
HCT: 38.1 % — ABNORMAL LOW (ref 39.0–52.0)
Hemoglobin: 12.1 g/dL — ABNORMAL LOW (ref 13.0–17.0)
Immature Granulocytes: 0 %
Lymphocytes Relative: 20 %
Lymphs Abs: 2 10*3/uL (ref 0.7–4.0)
MCH: 25.1 pg — ABNORMAL LOW (ref 26.0–34.0)
MCHC: 31.8 g/dL (ref 30.0–36.0)
MCV: 78.9 fL — ABNORMAL LOW (ref 80.0–100.0)
Monocytes Absolute: 0.5 10*3/uL (ref 0.1–1.0)
Monocytes Relative: 5 %
Neutro Abs: 4.1 10*3/uL (ref 1.7–7.7)
Neutrophils Relative %: 41 %
Platelet Count: 321 10*3/uL (ref 150–400)
RBC: 4.83 MIL/uL (ref 4.22–5.81)
RDW: 14.5 % (ref 11.5–15.5)
WBC Count: 10 10*3/uL (ref 4.0–10.5)
nRBC: 0 % (ref 0.0–0.2)

## 2023-04-22 LAB — TSH: TSH: 2.563 u[IU]/mL (ref 0.350–4.500)

## 2023-04-22 MED ORDER — SODIUM CHLORIDE 0.9 % IV SOLN: Freq: Once | INTRAVENOUS | Status: AC

## 2023-04-22 MED ORDER — SODIUM CHLORIDE 0.9 % IV SOLN
400.0000 mg | Freq: Once | INTRAVENOUS | Status: AC
  Administered 2023-04-22: 400 mg via INTRAVENOUS
  Filled 2023-04-22: qty 16

## 2023-04-22 NOTE — Progress Notes (Signed)
[facility] Telephone:([phone]   Fax:([phone]  OFFICE PROGRESS NOTE  Michael Corn, MD [address] [place] [address]  DIAGNOSIS: Stage IV clear-cell renal cell carcinoma initially diagnosed as locally advanced disease in 2020 with evidence of pulmonary metastasis in 2021.  PRIOR THERAPY: 1) status post right nephrectomy on August 27, 2019 in [place] and the final pathology showed clear-cell renal cell carcinoma nuclear grade 3 with 10% necrosis and negative margin with the final pathologic stage of T2a, NX. 2) status post treatment with Keytruda and axitinib started March 2021 for 2 cycles interrupted because of autoimmune hepatitis.  3) axitinib 5 mg daily started July 05, 2020 discontinued after 1 week because of elevated LFTs. 4) status post cryoablation of left renal mass on May 30, 2021.  CURRENT THERAPY: Keytruda 200 Mg IV every 3 weeks started July 27, 2021 and then switch it to 400 Mg every 6 weeks on November 09, 2021.  INTERVAL HISTORY: Michael [name] 57 y.o. male returns to the clinic today for follow-up visit accompanied by his wife Eunice Blase.  The patient is feeling fine today with no concerning complaints except for the dry mouth.  He denied having any current chest pain, shortness of breath, cough or hemoptysis.  He has no nausea, vomiting, diarrhea or constipation.  He has no headache or visual changes.  He denied having any recent weight loss or night sweats.  He is here today for evaluation before starting his next dose of treatment with Keytruda.   MEDICAL HISTORY: Past Medical History:  Diagnosis Date   Cancer [facility])    Renal cell carcinoma   Hypertension    Hypothyroidism    Sleep apnea    wears cpap    ALLERGIES:  is allergic to clindamycin, penicillins, and tape.  MEDICATIONS:  Current Outpatient Medications  Medication Sig Dispense Refill   amLODipine (NORVASC) 5 MG tablet Take 7.5 mg by mouth in the morning.      cholecalciferol (VITAMIN D3) 25 MCG (1000 UNIT) tablet Take 1,000 Units by mouth in the morning.     levothyroxine (SYNTHROID) 137 MCG tablet TAKE 1 TABLET(137 MCG) BY MOUTH DAILY BEFORE BREAKFAST 30 tablet 2   montelukast (SINGULAIR) 10 MG tablet Take 10 mg by mouth in the morning.     sodium chloride 0.9 % SOLN 50 mL with pembrolizumab 100 MG/4ML SOLN 200 mg Inject 200 mg into the vein every 21 ( twenty-one) days.     tadalafil (CIALIS) 20 MG tablet Take 10-20 mg by mouth daily as needed for erectile dysfunction.     No current facility-administered medications for this visit.    SURGICAL HISTORY:  Past Surgical History:  Procedure Laterality Date   IR RADIOLOGIST EVAL & MGMT  04/04/2021   no procedures performed   IR RADIOLOGIST EVAL & MGMT  05/08/2021   no procedures performed   IR RADIOLOGIST EVAL & MGMT  08/12/2022   LUNG BIOPSY  2021   RADIOLOGY WITH ANESTHESIA N/A 05/30/2021   Procedure: CT WITH ANESTHESIA CRYOABLATION;  Surgeon: Irish Lack, MD;  Location: [facility];  Service: Radiology;  Laterality: N/A;   right nephrectomy Right 08/2019   TONSILLECTOMY      REVIEW OF SYSTEMS:  A comprehensive review of systems was negative except for: Ears, nose, mouth, throat, and face: positive for dry mouth    PHYSICAL EXAMINATION: General appearance: alert, cooperative, appears stated age, and no distress Head: Normocephalic, without obvious abnormality, atraumatic Neck: no  adenopathy, no JVD, supple, symmetrical, trachea midline, and thyroid not enlarged, symmetric, no tenderness/mass/nodules Lymph nodes: Cervical, supraclavicular, and axillary nodes normal. Resp: clear to auscultation bilaterally Back: symmetric, no curvature. ROM normal. No CVA tenderness. Cardio: regular rate and rhythm, S1, S2 normal, no murmur, click, rub or gallop GI: soft, non-tender; bowel sounds normal; no masses,  no organomegaly Extremities: extremities normal, atraumatic, no cyanosis or edema  ECOG  PERFORMANCE STATUS: 1 - Symptomatic but completely ambulatory  Blood pressure 120/84, pulse 62, temperature 98.2 F (36.8 C), temperature source Oral, resp. rate 18, weight 248 lb 9.6 oz (112.8 kg), SpO2 99 %.  LABORATORY DATA: Lab Results  Component Value Date   WBC 9.1 03/04/2023   HGB 12.0 (L) 03/04/2023   HCT 37.4 (L) 03/04/2023   MCV 81.8 03/04/2023   PLT 323 03/04/2023      Chemistry      Component Value Date/Time   NA 140 03/04/2023 0846   K 3.9 03/04/2023 0846   CL 109 03/04/2023 0846   CO2 25 03/04/2023 0846   BUN 15 03/04/2023 0846   CREATININE 1.41 (H) 03/04/2023 0846      Component Value Date/Time   CALCIUM 8.8 (L) 03/04/2023 0846   ALKPHOS 91 03/04/2023 0846   AST 21 03/04/2023 0846   ALT 15 03/04/2023 0846   BILITOT 0.4 03/04/2023 0846       RADIOGRAPHIC STUDIES: No results found.  ASSESSMENT AND PLAN: This is a very pleasant 57 years old white male with Stage IV clear-cell renal cell carcinoma initially diagnosed as locally advanced disease in 2020 with evidence of pulmonary metastasis in 2021.  He is status post right nephrectomy on August 27, 2019 in [place] and the final pathology showed clear-cell renal cell carcinoma nuclear grade 3 with 10% necrosis and negative margin with the final pathologic stage of T2a, NX. The patient is status post treatment with [facility] and axitinib started March 2021 for 2 cycles interrupted because of autoimmune hepatitis. He then  started Axitinib 5 mg daily on July 05, 2020 discontinued after 1 week because of elevated LFTs. He is also status post cryoablation of left renal mass on May 30, 2021. The patient is currently undergoing treatment with Keytruda 200 Mg IV every 3 weeks started July 27, 2021 and then switch it to 400 Mg every 6 weeks on November 09, 2021. The patient has been tolerating this treatment well with no concerning adverse effects except for the dry mouth. I recommended for him to proceed with his  treatment today as planned.  He is expected to complete this treatment in September 2024. I will arrange for him to have repeat CT scan of the chest, abdomen and pelvis before the next visit for restaging of his disease. He was advised to call immediately if he has any other concerning symptoms in the interval. The patient voices understanding of current disease status and treatment options and is in agreement with the current care plan.  All questions were answered. The patient knows to call the clinic with any problems, questions or concerns. We can certainly see the patient much sooner if necessary.  The total time spent in the appointment was 20 minutes.  Disclaimer: This note was dictated with voice recognition software. Similar sounding words can inadvertently be transcribed and may not be corrected upon review.

## 2023-04-22 NOTE — Patient Instructions (Signed)
[facility]  Discharge Instructions: Thank you for choosing [facility] to provide your oncology and hematology care.   If you have a lab appointment with the Cancer Center, please go directly to the Cancer Center and check in at the registration area.   Wear comfortable clothing and clothing appropriate for easy access to any Portacath or PICC line.   We strive to give you quality time with your provider. You may need to reschedule your appointment if you arrive late (15 or more minutes).  Arriving late affects you and other patients whose appointments are after yours.  Also, if you miss three or more appointments without notifying the office, you may be dismissed from the clinic at the provider's discretion.      For prescription refill requests, have your pharmacy contact our office and allow 72 hours for refills to be completed.    Today you received the following chemotherapy and/or immunotherapy agents: Keytruda      To help prevent nausea and vomiting after your treatment, we encourage you to take your nausea medication as directed.  BELOW ARE SYMPTOMS THAT SHOULD BE REPORTED IMMEDIATELY: *FEVER GREATER THAN 100.4 F (38 C) OR HIGHER *CHILLS OR SWEATING *NAUSEA AND VOMITING THAT IS NOT CONTROLLED WITH YOUR NAUSEA MEDICATION *UNUSUAL SHORTNESS OF BREATH *UNUSUAL BRUISING OR BLEEDING *URINARY PROBLEMS (pain or burning when urinating, or frequent urination) *BOWEL PROBLEMS (unusual diarrhea, constipation, pain near the anus) TENDERNESS IN MOUTH AND THROAT WITH OR WITHOUT PRESENCE OF ULCERS (sore throat, sores in mouth, or a toothache) UNUSUAL RASH, SWELLING OR PAIN  UNUSUAL VAGINAL DISCHARGE OR ITCHING   Items with * indicate a potential emergency and should be followed up as soon as possible or go to the Emergency Department if any problems should occur.  Please show the CHEMOTHERAPY ALERT CARD or IMMUNOTHERAPY ALERT CARD at  check-in to the Emergency Department and triage nurse.  Should you have questions after your visit or need to cancel or reschedule your appointment, please contact [facility]  Dept: [phone]  and follow the prompts.  Office hours are 8:00 a.m. to 4:30 p.m. Monday - Friday. Please note that voicemails left after 4:00 p.m. may not be returned until the following business day.  We are closed weekends and major holidays. You have access to a nurse at all times for urgent questions. Please call the main number to the clinic Dept: [phone] and follow the prompts.   For any non-urgent questions, you may also contact your provider using MyChart. We now offer e-Visits for anyone 18 and older to request care online for non-urgent symptoms. For details visit [email or web address].   Also download the MyChart app! Go to the app store, search "MyChart", open the app, select [facility], and log in with your MyChart username and password.   

## 2023-04-23 ENCOUNTER — Telehealth: Payer: Self-pay | Admitting: Internal Medicine

## 2023-04-23 NOTE — Telephone Encounter (Signed)
Called patient regarding July appointments, left a voicemail. 

## 2023-04-24 LAB — T4: T4, Total: 8.2 ug/dL (ref 4.5–12.0)

## 2023-05-18 ENCOUNTER — Other Ambulatory Visit: Payer: Self-pay

## 2023-05-21 ENCOUNTER — Other Ambulatory Visit: Payer: Self-pay

## 2023-05-22 ENCOUNTER — Other Ambulatory Visit: Payer: Self-pay

## 2023-05-23 DIAGNOSIS — M19011 Primary osteoarthritis, right shoulder: Secondary | ICD-10-CM | POA: Diagnosis not present

## 2023-05-23 DIAGNOSIS — M542 Cervicalgia: Secondary | ICD-10-CM | POA: Diagnosis not present

## 2023-05-27 ENCOUNTER — Ambulatory Visit: Payer: BC Managed Care – PPO | Admitting: Internal Medicine

## 2023-05-27 ENCOUNTER — Other Ambulatory Visit: Payer: BC Managed Care – PPO

## 2023-05-27 ENCOUNTER — Ambulatory Visit: Payer: BC Managed Care – PPO

## 2023-05-29 ENCOUNTER — Ambulatory Visit (HOSPITAL_COMMUNITY)
Admission: RE | Admit: 2023-05-29 | Discharge: 2023-05-29 | Disposition: A | Discharge status: Home / Self Care | Payer: BC Managed Care – PPO | Source: Ambulatory Visit | Attending: Internal Medicine | Admitting: Internal Medicine

## 2023-05-29 DIAGNOSIS — C3491 Malignant neoplasm of unspecified part of right bronchus or lung: Secondary | ICD-10-CM | POA: Diagnosis not present

## 2023-05-29 DIAGNOSIS — R918 Other nonspecific abnormal finding of lung field: Secondary | ICD-10-CM | POA: Diagnosis not present

## 2023-05-29 DIAGNOSIS — K7689 Other specified diseases of liver: Secondary | ICD-10-CM | POA: Diagnosis not present

## 2023-05-29 DIAGNOSIS — C649 Malignant neoplasm of unspecified kidney, except renal pelvis: Secondary | ICD-10-CM | POA: Diagnosis not present

## 2023-06-02 DIAGNOSIS — M542 Cervicalgia: Secondary | ICD-10-CM | POA: Diagnosis not present

## 2023-06-03 ENCOUNTER — Inpatient Hospital Stay: Payer: BC Managed Care – PPO | Attending: Oncology

## 2023-06-03 ENCOUNTER — Inpatient Hospital Stay: Payer: BC Managed Care – PPO

## 2023-06-03 ENCOUNTER — Inpatient Hospital Stay (HOSPITAL_BASED_OUTPATIENT_CLINIC_OR_DEPARTMENT_OTHER): Payer: BC Managed Care – PPO | Admitting: Internal Medicine

## 2023-06-03 ENCOUNTER — Other Ambulatory Visit: Payer: Self-pay

## 2023-06-03 VITALS — BP 123/83 | HR 54

## 2023-06-03 DIAGNOSIS — C641 Malignant neoplasm of right kidney, except renal pelvis: Secondary | ICD-10-CM | POA: Diagnosis not present

## 2023-06-03 DIAGNOSIS — C649 Malignant neoplasm of unspecified kidney, except renal pelvis: Secondary | ICD-10-CM

## 2023-06-03 DIAGNOSIS — K7689 Other specified diseases of liver: Secondary | ICD-10-CM | POA: Insufficient documentation

## 2023-06-03 DIAGNOSIS — Z880 Allergy status to penicillin: Secondary | ICD-10-CM | POA: Diagnosis not present

## 2023-06-03 DIAGNOSIS — Z881 Allergy status to other antibiotic agents status: Secondary | ICD-10-CM | POA: Insufficient documentation

## 2023-06-03 DIAGNOSIS — Z905 Acquired absence of kidney: Secondary | ICD-10-CM | POA: Insufficient documentation

## 2023-06-03 DIAGNOSIS — M25511 Pain in right shoulder: Secondary | ICD-10-CM | POA: Insufficient documentation

## 2023-06-03 DIAGNOSIS — C7800 Secondary malignant neoplasm of unspecified lung: Secondary | ICD-10-CM | POA: Diagnosis not present

## 2023-06-03 DIAGNOSIS — C642 Malignant neoplasm of left kidney, except renal pelvis: Secondary | ICD-10-CM | POA: Diagnosis not present

## 2023-06-03 DIAGNOSIS — M47816 Spondylosis without myelopathy or radiculopathy, lumbar region: Secondary | ICD-10-CM | POA: Diagnosis not present

## 2023-06-03 DIAGNOSIS — Z79620 Long term (current) use of immunosuppressive biologic: Secondary | ICD-10-CM | POA: Diagnosis not present

## 2023-06-03 DIAGNOSIS — I700 Atherosclerosis of aorta: Secondary | ICD-10-CM | POA: Insufficient documentation

## 2023-06-03 DIAGNOSIS — Z5112 Encounter for antineoplastic immunotherapy: Secondary | ICD-10-CM | POA: Insufficient documentation

## 2023-06-03 DIAGNOSIS — K754 Autoimmune hepatitis: Secondary | ICD-10-CM | POA: Diagnosis not present

## 2023-06-03 DIAGNOSIS — Z79890 Hormone replacement therapy (postmenopausal): Secondary | ICD-10-CM | POA: Insufficient documentation

## 2023-06-03 LAB — CBC WITH DIFFERENTIAL (CANCER CENTER ONLY)
Abs Immature Granulocytes: 0.02 10*3/uL (ref 0.00–0.07)
Basophils Absolute: 0 10*3/uL (ref 0.0–0.1)
Basophils Relative: 1 %
Eosinophils Absolute: 0.5 10*3/uL (ref 0.0–0.5)
Eosinophils Relative: 8 %
HCT: 39 % (ref 39.0–52.0)
Hemoglobin: 12.3 g/dL — ABNORMAL LOW (ref 13.0–17.0)
Immature Granulocytes: 0 %
Lymphocytes Relative: 26 %
Lymphs Abs: 1.6 10*3/uL (ref 0.7–4.0)
MCH: 24.4 pg — ABNORMAL LOW (ref 26.0–34.0)
MCHC: 31.5 g/dL (ref 30.0–36.0)
MCV: 77.4 fL — ABNORMAL LOW (ref 80.0–100.0)
Monocytes Absolute: 0.4 10*3/uL (ref 0.1–1.0)
Monocytes Relative: 7 %
Neutro Abs: 3.6 10*3/uL (ref 1.7–7.7)
Neutrophils Relative %: 58 %
Platelet Count: 327 10*3/uL (ref 150–400)
RBC: 5.04 MIL/uL (ref 4.22–5.81)
RDW: 15.5 % (ref 11.5–15.5)
WBC Count: 6.1 10*3/uL (ref 4.0–10.5)
nRBC: 0 % (ref 0.0–0.2)

## 2023-06-03 LAB — TSH: TSH: 10.298 u[IU]/mL — ABNORMAL HIGH (ref 0.350–4.500)

## 2023-06-03 LAB — CMP (CANCER CENTER ONLY)
ALT: 17 U/L (ref 0–44)
AST: 17 U/L (ref 15–41)
Albumin: 4.1 g/dL (ref 3.5–5.0)
Alkaline Phosphatase: 91 U/L (ref 38–126)
Anion gap: 6 (ref 5–15)
BUN: 16 mg/dL (ref 6–20)
CO2: 26 mmol/L (ref 22–32)
Calcium: 9.1 mg/dL (ref 8.9–10.3)
Chloride: 107 mmol/L (ref 98–111)
Creatinine: 1.27 mg/dL — ABNORMAL HIGH (ref 0.61–1.24)
GFR, Estimated: >60 mL/min (ref >60)
Glucose, Bld: 106 mg/dL — ABNORMAL HIGH (ref 70–99)
Potassium: 4.2 mmol/L (ref 3.5–5.1)
Sodium: 139 mmol/L (ref 135–145)
Total Bilirubin: 0.4 mg/dL (ref 0.3–1.2)
Total Protein: 7.1 g/dL (ref 6.5–8.1)

## 2023-06-03 MED ORDER — SODIUM CHLORIDE 0.9 % IV SOLN
400.0000 mg | Freq: Once | INTRAVENOUS | Status: AC
  Administered 2023-06-03: 400 mg via INTRAVENOUS
  Filled 2023-06-03: qty 16

## 2023-06-03 MED ORDER — SODIUM CHLORIDE 0.9 % IV SOLN: Freq: Once | INTRAVENOUS | Status: AC

## 2023-06-03 NOTE — Progress Notes (Signed)
[facility] [phone]   [phone]  OFFICE PROGRESS NOTE  [name], MD [address] [place] [address]  DIAGNOSIS: Stage IV clear-cell renal cell carcinoma initially diagnosed as locally advanced disease in 2020 with evidence of pulmonary metastasis in 2021.  PRIOR THERAPY: 1) status post right nephrectomy on August 27, 2019 in [place] and the final pathology showed clear-cell renal cell carcinoma nuclear grade 3 with 10% necrosis and negative margin with the final pathologic stage of T2a, NX. 2) status post treatment with Keytruda and axitinib started March 2021 for 2 cycles interrupted because of autoimmune hepatitis.  3) axitinib 5 mg daily started July 05, 2020 discontinued after 1 week because of elevated LFTs. 4) status post cryoablation of left renal mass on May 30, 2021.  CURRENT THERAPY: Keytruda 200 Mg IV every 3 weeks started July 27, 2021 and then switch it to 400 Mg every 6 weeks on November 09, 2021.  INTERVAL HISTORY: Michael [name] 57 y.o. male returns to the clinic today for follow-up visit accompanied by his wife.  The patient is feeling fine today with no concerning complaints except for right shoulder pain from some recent trauma.  He denied having any current chest pain, shortness of breath, cough or hemoptysis.  He has no nausea, vomiting, diarrhea or constipation.  He has no headache or visual changes.  He denied having any fever or chills.  He continues to tolerate his treatment with Keytruda fairly well.  The patient had repeat CT scan of the chest, abdomen and pelvis performed recently and he is here for evaluation and discussion of his scan results.  MEDICAL HISTORY: Past Medical History:  Diagnosis Date   Cancer [facility])    Renal cell carcinoma   Hypertension    Hypothyroidism    Sleep apnea    wears cpap    ALLERGIES:  is allergic to clindamycin, penicillins, and tape.  MEDICATIONS:  Current  Outpatient Medications  Medication Sig Dispense Refill   amLODipine (NORVASC) 5 MG tablet Take 7.5 mg by mouth in the morning.     cholecalciferol (VITAMIN D3) 25 MCG (1000 UNIT) tablet Take 1,000 Units by mouth in the morning.     levothyroxine (SYNTHROID) 137 MCG tablet TAKE 1 TABLET(137 MCG) BY MOUTH DAILY BEFORE BREAKFAST 30 tablet 2   montelukast (SINGULAIR) 10 MG tablet Take 10 mg by mouth in the morning.     sodium chloride 0.9 % SOLN 50 mL with pembrolizumab 100 MG/4ML SOLN 200 mg Inject 200 mg into the vein every 21 ( twenty-one) days.     tadalafil (CIALIS) 20 MG tablet Take 10-20 mg by mouth daily as needed for erectile dysfunction.     No current facility-administered medications for this visit.    SURGICAL HISTORY:  Past Surgical History:  Procedure Laterality Date   IR RADIOLOGIST EVAL & MGMT  04/04/2021   no procedures performed   IR RADIOLOGIST EVAL & MGMT  05/08/2021   no procedures performed   IR RADIOLOGIST EVAL & MGMT  08/12/2022   LUNG BIOPSY  2021   RADIOLOGY WITH ANESTHESIA N/A 05/30/2021   Procedure: CT WITH ANESTHESIA CRYOABLATION;  Surgeon: Irish Lack, MD;  Location: [facility];  Service: Radiology;  Laterality: N/A;   right nephrectomy Right 08/2019   TONSILLECTOMY      REVIEW OF SYSTEMS:  Constitutional: negative Eyes: negative Ears, nose, mouth, throat, and face: negative Respiratory: negative Cardiovascular: negative Gastrointestinal: negative Genitourinary:negative Integument/breast: negative Hematologic/lymphatic:  negative Musculoskeletal:positive for arthralgias Neurological: negative Behavioral/Psych: negative Endocrine: negative Allergic/Immunologic: negative   PHYSICAL EXAMINATION: General appearance: alert, cooperative, appears stated age, and no distress Head: Normocephalic, without obvious abnormality, atraumatic Neck: no adenopathy, no JVD, supple, symmetrical, trachea midline, and thyroid not enlarged, symmetric, no  tenderness/mass/nodules Lymph nodes: Cervical, supraclavicular, and axillary nodes normal. Resp: clear to auscultation bilaterally Back: symmetric, no curvature. ROM normal. No CVA tenderness. Cardio: regular rate and rhythm, S1, S2 normal, no murmur, click, rub or gallop GI: soft, non-tender; bowel sounds normal; no masses,  no organomegaly Extremities: extremities normal, atraumatic, no cyanosis or edema Neurologic: Alert and oriented X 3, normal strength and tone. Normal symmetric reflexes. Normal coordination and gait  ECOG PERFORMANCE STATUS: 1 - Symptomatic but completely ambulatory  Blood pressure 129/85, pulse (!) 55, temperature 97.9 F (36.6 C), temperature source Temporal, resp. rate 19, weight 251 lb 9.6 oz (114.1 kg), SpO2 98%.  LABORATORY DATA: Lab Results  Component Value Date   WBC 6.1 06/03/2023   HGB 12.3 (L) 06/03/2023   HCT 39.0 06/03/2023   MCV 77.4 (L) 06/03/2023   PLT 327 06/03/2023      Chemistry      Component Value Date/Time   NA 139 04/22/2023 1333   K 4.1 04/22/2023 1333   CL 108 04/22/2023 1333   CO2 25 04/22/2023 1333   BUN 14 04/22/2023 1333   CREATININE 1.18 04/22/2023 1333      Component Value Date/Time   CALCIUM 8.9 04/22/2023 1333   ALKPHOS 107 04/22/2023 1333   AST 19 04/22/2023 1333   ALT 17 04/22/2023 1333   BILITOT 0.5 04/22/2023 1333       RADIOGRAPHIC STUDIES: CT Chest Wo Contrast  Result Date: 06/02/2023 CLINICAL DATA:  History of right renal cell carcinoma. Restaging. * Tracking Code: BO * EXAM: CT CHEST, ABDOMEN AND PELVIS WITHOUT CONTRAST TECHNIQUE: Multidetector CT imaging of the chest, abdomen and pelvis was performed following the standard protocol without IV contrast. RADIATION DOSE REDUCTION: This exam was performed according to the departmental dose-optimization program which includes automated exposure control, adjustment of the mA and/or kV according to patient size and/or use of iterative reconstruction technique.  COMPARISON:  Prior CTs 02/27/2023 and 04/24/2022. Abdominal MRI 07/21/2022. FINDINGS: CT CHEST FINDINGS Cardiovascular: Minimal aortic atherosclerosis. No acute vascular findings on noncontrast imaging. The heart size is normal. There is no pericardial effusion. Mediastinum/Nodes: There are no enlarged mediastinal, hilar or axillary lymph nodes. Hilar assessment is limited by the lack of intravenous contrast, although the hilar contours appear unchanged. The thyroid gland, trachea and esophagus demonstrate no significant findings. Lungs/Pleura: No pleural effusion or pneumothorax. Stable right lower lobe pulmonary nodules measuring up to 4 mm on image 88/4, consistent with benign findings. The small nodule seen in the left lower lobe on the most recent CT is no longer visualized. No new or enlarging pulmonary nodules. Musculoskeletal/Chest wall: No chest wall mass or suspicious osseous findings. Mild spondylosis. CT ABDOMEN AND PELVIS FINDINGS Hepatobiliary: The liver is normal in density without suspicious focal abnormality. Unchanged hepatic cysts. No evidence of gallstones, gallbladder wall thickening or biliary dilatation. Pancreas: Unremarkable. No pancreatic ductal dilatation or surrounding inflammatory changes. Spleen: Normal in size without focal abnormality. Adrenals/Urinary Tract: Both adrenal glands appear normal. Status post right nephrectomy. No mass in the nephrectomy bed. The ablation site in the interpolar region of the left kidney appears unchanged, measuring 1.9 x 1.7 cm on image 68/2. No evidence of urinary tract calculus, hydronephrosis or perinephric  soft tissue stranding. The bladder appears normal for its degree of distention. Stomach/Bowel: No enteric contrast administered. The stomach appears unremarkable for its degree of distension. No evidence of bowel wall thickening, distention or surrounding inflammatory change. The appendix appears normal. Vascular/Lymphatic: Stable 1 cm lymph node  posterior to the duodenum on image 60/2. No progressive abdominopelvic adenopathy identified. No significant vascular findings on noncontrast imaging. Reproductive: The prostate gland and seminal vesicles appear unremarkable. Other: No evidence of abdominal wall mass or hernia. No ascites or pneumoperitoneum. Musculoskeletal: No acute or significant osseous findings. Mild lumbar spondylosis. Unless specific follow-up recommendations are mentioned in the findings or impression sections, no imaging follow-up of any mentioned incidental findings is recommended. IMPRESSION: 1. No evidence of local recurrence or metastatic disease status post right nephrectomy and left renal ablation. 2. Stable retroduodenal lymph node.  No progressive adenopathy. 3. Stable small right lower lobe pulmonary nodules, consistent with benign findings. The small left lower lobe pulmonary nodule seen on the most recent CT has resolved. 4. No acute findings. Electronically Signed   By: Carey Bullocks M.D.   On: 06/02/2023 16:06   CT Abdomen Pelvis Wo Contrast  Result Date: 06/02/2023 CLINICAL DATA:  History of right renal cell carcinoma. Restaging. * Tracking Code: BO * EXAM: CT CHEST, ABDOMEN AND PELVIS WITHOUT CONTRAST TECHNIQUE: Multidetector CT imaging of the chest, abdomen and pelvis was performed following the standard protocol without IV contrast. RADIATION DOSE REDUCTION: This exam was performed according to the departmental dose-optimization program which includes automated exposure control, adjustment of the mA and/or kV according to patient size and/or use of iterative reconstruction technique. COMPARISON:  Prior CTs 02/27/2023 and 04/24/2022. Abdominal MRI 07/21/2022. FINDINGS: CT CHEST FINDINGS Cardiovascular: Minimal aortic atherosclerosis. No acute vascular findings on noncontrast imaging. The heart size is normal. There is no pericardial effusion. Mediastinum/Nodes: There are no enlarged mediastinal, hilar or axillary lymph  nodes. Hilar assessment is limited by the lack of intravenous contrast, although the hilar contours appear unchanged. The thyroid gland, trachea and esophagus demonstrate no significant findings. Lungs/Pleura: No pleural effusion or pneumothorax. Stable right lower lobe pulmonary nodules measuring up to 4 mm on image 88/4, consistent with benign findings. The small nodule seen in the left lower lobe on the most recent CT is no longer visualized. No new or enlarging pulmonary nodules. Musculoskeletal/Chest wall: No chest wall mass or suspicious osseous findings. Mild spondylosis. CT ABDOMEN AND PELVIS FINDINGS Hepatobiliary: The liver is normal in density without suspicious focal abnormality. Unchanged hepatic cysts. No evidence of gallstones, gallbladder wall thickening or biliary dilatation. Pancreas: Unremarkable. No pancreatic ductal dilatation or surrounding inflammatory changes. Spleen: Normal in size without focal abnormality. Adrenals/Urinary Tract: Both adrenal glands appear normal. Status post right nephrectomy. No mass in the nephrectomy bed. The ablation site in the interpolar region of the left kidney appears unchanged, measuring 1.9 x 1.7 cm on image 68/2. No evidence of urinary tract calculus, hydronephrosis or perinephric soft tissue stranding. The bladder appears normal for its degree of distention. Stomach/Bowel: No enteric contrast administered. The stomach appears unremarkable for its degree of distension. No evidence of bowel wall thickening, distention or surrounding inflammatory change. The appendix appears normal. Vascular/Lymphatic: Stable 1 cm lymph node posterior to the duodenum on image 60/2. No progressive abdominopelvic adenopathy identified. No significant vascular findings on noncontrast imaging. Reproductive: The prostate gland and seminal vesicles appear unremarkable. Other: No evidence of abdominal wall mass or hernia. No ascites or pneumoperitoneum. Musculoskeletal: No acute or  significant  osseous findings. Mild lumbar spondylosis. Unless specific follow-up recommendations are mentioned in the findings or impression sections, no imaging follow-up of any mentioned incidental findings is recommended. IMPRESSION: 1. No evidence of local recurrence or metastatic disease status post right nephrectomy and left renal ablation. 2. Stable retroduodenal lymph node.  No progressive adenopathy. 3. Stable small right lower lobe pulmonary nodules, consistent with benign findings. The small left lower lobe pulmonary nodule seen on the most recent CT has resolved. 4. No acute findings. Electronically Signed   By: Carey Bullocks M.D.   On: 06/02/2023 16:06    ASSESSMENT AND PLAN: This is a very pleasant 57 years old white male with Stage IV clear-cell renal cell carcinoma initially diagnosed as locally advanced disease in 2020 with evidence of pulmonary metastasis in 2021.  He is status post right nephrectomy on August 27, 2019 in [place] and the final pathology showed clear-cell renal cell carcinoma nuclear grade 3 with 10% necrosis and negative margin with the final pathologic stage of T2a, NX. The patient is status post treatment with [facility] and axitinib started March 2021 for 2 cycles interrupted because of autoimmune hepatitis. He then  started Axitinib 5 mg daily on July 05, 2020 discontinued after 1 week because of elevated LFTs. He is also status post cryoablation of left renal mass on May 30, 2021. The patient is currently undergoing treatment with Keytruda 200 Mg IV every 3 weeks started July 27, 2021 and then switch it to 400 Mg every 6 weeks on November 09, 2021. The patient has been tolerating this treatment well with no concerning adverse effects except for the dry mouth. The patient is feeling fine today with no concerning complaints except for the right shoulder pain from recent trauma. He had repeat CT scan of the chest, abdomen and pelvis performed recently.  I  personally and independently reviewed the scan and discussed the result with the patient and his wife. His scan showed no concerning findings for recurrent or metastatic disease. I recommended for him to complete the last cycle of his treatment with immunotherapy today as planned.  By the cycle he would have completed 2 years of treatment with immunotherapy. I will see him back for follow-up visit in 4 months for evaluation with repeat CT scan of the chest, abdomen and pelvis for restaging of his disease. The patient was advised to call immediately if he has any concerning symptoms in the interval. The patient voices understanding of current disease status and treatment options and is in agreement with the current care plan.  All questions were answered. The patient knows to call the clinic with any problems, questions or concerns. We can certainly see the patient much sooner if necessary.  The total time spent in the appointment was 30 minutes.  Disclaimer: This note was dictated with voice recognition software. Similar sounding words can inadvertently be transcribed and may not be corrected upon review.

## 2023-06-04 ENCOUNTER — Other Ambulatory Visit: Payer: Self-pay

## 2023-06-04 DIAGNOSIS — M542 Cervicalgia: Secondary | ICD-10-CM | POA: Diagnosis not present

## 2023-06-04 LAB — T4: T4, Total: 8.3 ug/dL (ref 4.5–12.0)

## 2023-06-09 DIAGNOSIS — M542 Cervicalgia: Secondary | ICD-10-CM | POA: Diagnosis not present

## 2023-06-16 DIAGNOSIS — M542 Cervicalgia: Secondary | ICD-10-CM | POA: Diagnosis not present

## 2023-06-18 DIAGNOSIS — M542 Cervicalgia: Secondary | ICD-10-CM | POA: Diagnosis not present

## 2023-06-23 DIAGNOSIS — M542 Cervicalgia: Secondary | ICD-10-CM | POA: Diagnosis not present

## 2023-06-27 DIAGNOSIS — M542 Cervicalgia: Secondary | ICD-10-CM | POA: Diagnosis not present

## 2023-06-30 DIAGNOSIS — M542 Cervicalgia: Secondary | ICD-10-CM | POA: Diagnosis not present

## 2023-07-08 ENCOUNTER — Other Ambulatory Visit: Payer: BC Managed Care – PPO

## 2023-07-08 ENCOUNTER — Ambulatory Visit: Payer: BC Managed Care – PPO

## 2023-07-08 ENCOUNTER — Ambulatory Visit: Payer: BC Managed Care – PPO | Admitting: Internal Medicine

## 2023-07-15 ENCOUNTER — Telehealth: Payer: Self-pay | Admitting: Medical Oncology

## 2023-07-15 NOTE — Telephone Encounter (Signed)
I  lvm to return my call and gave her pod 3 fax and phone number .

## 2023-07-23 ENCOUNTER — Telehealth: Payer: Self-pay | Admitting: Medical Oncology

## 2023-07-23 NOTE — Telephone Encounter (Signed)
I LVM that pt is still on Pembrolizumab.

## 2023-07-24 ENCOUNTER — Telehealth: Payer: Self-pay | Admitting: Medical Oncology

## 2023-07-24 NOTE — Telephone Encounter (Signed)
[name] @[facility] asked if pt can get his Michael [name] somewhere else.? I told her is is off treatment

## 2023-08-02 ENCOUNTER — Other Ambulatory Visit: Payer: Self-pay | Admitting: Physician Assistant

## 2023-08-02 DIAGNOSIS — E039 Hypothyroidism, unspecified: Secondary | ICD-10-CM

## 2023-08-27 DIAGNOSIS — G4733 Obstructive sleep apnea (adult) (pediatric): Secondary | ICD-10-CM | POA: Diagnosis not present

## 2023-09-02 DIAGNOSIS — Z0000 Encounter for general adult medical examination without abnormal findings: Secondary | ICD-10-CM | POA: Diagnosis not present

## 2023-09-02 DIAGNOSIS — R911 Solitary pulmonary nodule: Secondary | ICD-10-CM | POA: Diagnosis not present

## 2023-09-12 ENCOUNTER — Other Ambulatory Visit: Payer: Self-pay

## 2023-09-24 ENCOUNTER — Other Ambulatory Visit (HOSPITAL_COMMUNITY): Payer: Self-pay | Admitting: Registered Nurse

## 2023-09-24 DIAGNOSIS — I10 Essential (primary) hypertension: Secondary | ICD-10-CM | POA: Diagnosis not present

## 2023-09-24 DIAGNOSIS — R519 Headache, unspecified: Secondary | ICD-10-CM | POA: Diagnosis not present

## 2023-09-24 DIAGNOSIS — R42 Dizziness and giddiness: Secondary | ICD-10-CM

## 2023-09-25 DIAGNOSIS — I129 Hypertensive chronic kidney disease with stage 1 through stage 4 chronic kidney disease, or unspecified chronic kidney disease: Secondary | ICD-10-CM | POA: Diagnosis not present

## 2023-09-25 DIAGNOSIS — E039 Hypothyroidism, unspecified: Secondary | ICD-10-CM | POA: Diagnosis not present

## 2023-09-25 DIAGNOSIS — C641 Malignant neoplasm of right kidney, except renal pelvis: Secondary | ICD-10-CM | POA: Diagnosis not present

## 2023-09-25 DIAGNOSIS — N1831 Chronic kidney disease, stage 3a: Secondary | ICD-10-CM | POA: Diagnosis not present

## 2023-09-27 NOTE — Progress Notes (Unsigned)
[facility] OFFICE PROGRESS NOTE  [name], MD [address] [place] [address]  DIAGNOSIS: Stage IV clear-cell renal cell carcinoma initially diagnosed as locally advanced disease in 2020 with evidence of pulmonary metastasis in 2021.   PRIOR THERAPY: 1) status post right nephrectomy on August 27, 2019 in [place] and the final pathology showed clear-cell renal cell carcinoma nuclear grade 3 with 10% necrosis and negative margin with the final pathologic stage of T2a, NX. 2) status post treatment with Keytruda and axitinib started March 2021 for 2 cycles interrupted because of autoimmune hepatitis.  3) axitinib 5 mg daily started July 05, 2020 discontinued after 1 week because of elevated LFTs. 4) status post cryoablation of left renal mass on May 30, 2021. 5) Keytruda 200 Mg IV every 3 weeks started July 27, 2021 and then switch it to 400 Mg every 6 weeks on November 09, 2021.   CURRENT THERAPY: Observation  INTERVAL HISTORY: Michael [name] 57 y.o. male returns to the clinic today for a follow-up visit. He completed treatment with [place]. He is currently on observation and feeling well. He denies any changes in his health since he was last seen in July 2024 by Dr. Arbutus Ped. Denies any fever, chills, night sweats, or unexplained weight loss. He has intentionally been trying to lose weight with exercising and changing his dietary habits.  Denies any chest pain, shortness of breath, cough, or hemoptysis. His is active and plays tennis and golf. Denies any headaches or visual changes.  He denies any back or abdominal pain.  Denies any dysuria, malodorous urine, or hematuria.   He denies any nausea, vomiting, diarrhea, or constipation.  He recently had a restaging CT scan performed.  He is here today for evaluation and to review his scan results      MEDICAL HISTORY: Past Medical History:  Diagnosis Date   Cancer [facility])    Renal cell carcinoma   Hypertension     Hypothyroidism    Sleep apnea    wears cpap    ALLERGIES:  is allergic to clindamycin, penicillins, and tape.  MEDICATIONS:  Current Outpatient Medications  Medication Sig Dispense Refill   amLODipine (NORVASC) 5 MG tablet Take 7.5 mg by mouth in the morning.     cholecalciferol (VITAMIN D3) 25 MCG (1000 UNIT) tablet Take 1,000 Units by mouth in the morning.     levothyroxine (SYNTHROID) 137 MCG tablet TAKE 1 TABLET(137 MCG) BY MOUTH DAILY BEFORE BREAKFAST 30 tablet 2   montelukast (SINGULAIR) 10 MG tablet Take 10 mg by mouth in the morning.     sodium chloride 0.9 % SOLN 50 mL with pembrolizumab 100 MG/4ML SOLN 200 mg Inject 200 mg into the vein every 21 ( twenty-one) days.     tadalafil (CIALIS) 20 MG tablet Take 10-20 mg by mouth daily as needed for erectile dysfunction.     No current facility-administered medications for this visit.    SURGICAL HISTORY:  Past Surgical History:  Procedure Laterality Date   IR RADIOLOGIST EVAL & MGMT  04/04/2021   no procedures performed   IR RADIOLOGIST EVAL & MGMT  05/08/2021   no procedures performed   IR RADIOLOGIST EVAL & MGMT  08/12/2022   LUNG BIOPSY  2021   RADIOLOGY WITH ANESTHESIA N/A 05/30/2021   Procedure: CT WITH ANESTHESIA CRYOABLATION;  Surgeon: Irish Lack, MD;  Location: [facility];  Service: Radiology;  Laterality: N/A;   right nephrectomy Right 08/2019   TONSILLECTOMY  REVIEW OF SYSTEMS:   Review of Systems  Constitutional: Negative for appetite change, chills, fatigue, fever and unexpected weight change.  HENT:   Negative for mouth sores, nosebleeds, sore throat and trouble swallowing.   Eyes: Negative for eye problems and icterus.  Respiratory: Negative for cough, hemoptysis, shortness of breath and wheezing.   Cardiovascular: Negative for chest pain and leg swelling.  Gastrointestinal: Negative for abdominal pain, constipation, diarrhea, nausea and vomiting.  Genitourinary: Negative for bladder incontinence,  difficulty urinating, dysuria, frequency and hematuria.   Musculoskeletal: Negative for back pain, gait problem, neck pain and neck stiffness.  Skin: Negative for itching and rash.  Neurological: Negative for dizziness, extremity weakness, gait problem, headaches, light-headedness and seizures.  Hematological: Negative for adenopathy. Does not bruise/bleed easily.  Psychiatric/Behavioral: Negative for confusion, depression and sleep disturbance. The patient is not nervous/anxious.     PHYSICAL EXAMINATION:  There were no vitals taken for this visit.  ECOG PERFORMANCE STATUS: {CHL ONC ECOG [id number]  Physical Exam  Constitutional: Oriented to person, place, and time and well-developed, well-nourished, and in no distress. No distress.  HENT:  Head: Normocephalic and atraumatic.  Mouth/Throat: Oropharynx is clear and moist. No oropharyngeal exudate.  Eyes: Conjunctivae are normal. Right eye exhibits no discharge. Left eye exhibits no discharge. No scleral icterus.  Neck: Normal range of motion. Neck supple.  Cardiovascular: Normal rate, regular rhythm, normal heart sounds and intact distal pulses.   Pulmonary/Chest: Effort normal and breath sounds normal. No respiratory distress. No wheezes. No rales.  Abdominal: Soft. Bowel sounds are normal. Exhibits no distension and no mass. There is no tenderness.  Musculoskeletal: Normal range of motion. Exhibits no edema.  Lymphadenopathy:    No cervical adenopathy.  Neurological: Alert and oriented to person, place, and time. Exhibits normal muscle tone. Gait normal. Coordination normal.  Skin: Skin is warm and dry. No rash noted. Not diaphoretic. No erythema. No pallor.  Psychiatric: Mood, memory and judgment normal.  Vitals reviewed.  LABORATORY DATA: Lab Results  Component Value Date   WBC 6.1 06/03/2023   HGB 12.3 (L) 06/03/2023   HCT 39.0 06/03/2023   MCV 77.4 (L) 06/03/2023   PLT 327 06/03/2023      Chemistry      Component  Value Date/Time   NA 139 06/03/2023 0847   K 4.2 06/03/2023 0847   CL 107 06/03/2023 0847   CO2 26 06/03/2023 0847   BUN 16 06/03/2023 0847   CREATININE 1.27 (H) 06/03/2023 0847      Component Value Date/Time   CALCIUM 9.1 06/03/2023 0847   ALKPHOS 91 06/03/2023 0847   AST 17 06/03/2023 0847   ALT 17 06/03/2023 0847   BILITOT 0.4 06/03/2023 0847       RADIOGRAPHIC STUDIES:  No results found.   ASSESSMENT/PLAN:  This is a very pleasant 57 year old male with stage IV clear-cell renal cell carcinoma.  This was initially diagnosed with locally advanced disease in 2020.  He had evidence of pulmonary metastases in 2021.  He is status post right nephrectomy on 10/23/202020 in [place].  The final pathology showed clear-cell renal cell carcinoma nuclear grade 3 with 10% necrosis and negative margins.  The final pathologic stage was T2a, NX.   The patient started treatment with Keytruda and Axitinib that was started in March 2021.  He had 2 cycles of treatment but it was interrupted due to autoimmune hepatitis.  He then started on 5 mg daily of axitinib on 07/05/2020 but this was also discontinued  after 1 week due to elevated LFTs.  He underwent cryoablation to the left renal mass on 05/30/2021.   He completed treatment with Keytruda.  He was initially on 200 mg IV every 3 weeks on 07/27/2021 but then was switched to 400 mg every 6 weeks starting from 11/09/2021.  He has been on observation since July 2024.   The patient was seen with Dr. Arbutus Ped today.  Dr. Arbutus Ped personally and independently reviewed the scan and discussed results with the patient today.  The scan showed ***.  Dr. Arbutus Ped recommends ***  Recommend he continue on observation with repeat imaging in 4 months.   We will see him back for labs and follow up visit 7-10 days after the CT scan.   The patient was advised to call immediately if he has any concerning symptoms in the interval. The patient voices understanding of  current disease status and treatment options and is in agreement with the current care plan. All questions were answered. The patient knows to call the clinic with any problems, questions or concerns. We can certainly see the patient much sooner if necessary   The patient was advised to call immediately if she has any concerning symptoms in the interval. The patient voices understanding of current disease status and treatment options and is in agreement with the current care plan. All questions were answered. The patient knows to call the clinic with any problems, questions or concerns. We can certainly see the patient much sooner if necessary        No orders of the defined types were placed in this encounter.    I spent {CHL ONC TIME VISIT - VHQIO:[id number]} counseling the patient face to face. The total time spent in the appointment was {CHL ONC TIME VISIT - KGMWN:[id number]}.  Michael [name] Maris, PA-C 09/27/23

## 2023-09-29 ENCOUNTER — Inpatient Hospital Stay: Payer: BC Managed Care – PPO | Attending: Oncology

## 2023-09-29 DIAGNOSIS — C649 Malignant neoplasm of unspecified kidney, except renal pelvis: Secondary | ICD-10-CM

## 2023-09-29 DIAGNOSIS — Z79620 Long term (current) use of immunosuppressive biologic: Secondary | ICD-10-CM | POA: Diagnosis not present

## 2023-09-29 DIAGNOSIS — C642 Malignant neoplasm of left kidney, except renal pelvis: Secondary | ICD-10-CM | POA: Insufficient documentation

## 2023-09-29 LAB — CBC WITH DIFFERENTIAL (CANCER CENTER ONLY)
Abs Immature Granulocytes: 0.01 10*3/uL (ref 0.00–0.07)
Basophils Absolute: 0 10*3/uL (ref 0.0–0.1)
Basophils Relative: 1 %
Eosinophils Absolute: 0.6 10*3/uL — ABNORMAL HIGH (ref 0.0–0.5)
Eosinophils Relative: 9 %
HCT: 39.8 % (ref 39.0–52.0)
Hemoglobin: 12.6 g/dL — ABNORMAL LOW (ref 13.0–17.0)
Immature Granulocytes: 0 %
Lymphocytes Relative: 27 %
Lymphs Abs: 1.7 10*3/uL (ref 0.7–4.0)
MCH: 25.3 pg — ABNORMAL LOW (ref 26.0–34.0)
MCHC: 31.7 g/dL (ref 30.0–36.0)
MCV: 79.9 fL — ABNORMAL LOW (ref 80.0–100.0)
Monocytes Absolute: 0.6 10*3/uL (ref 0.1–1.0)
Monocytes Relative: 10 %
Neutro Abs: 3.3 10*3/uL (ref 1.7–7.7)
Neutrophils Relative %: 53 %
Platelet Count: 322 10*3/uL (ref 150–400)
RBC: 4.98 MIL/uL (ref 4.22–5.81)
RDW: 16.1 % — ABNORMAL HIGH (ref 11.5–15.5)
WBC Count: 6.2 10*3/uL (ref 4.0–10.5)
nRBC: 0 % (ref 0.0–0.2)

## 2023-09-29 LAB — CMP (CANCER CENTER ONLY)
ALT: 20 U/L (ref 0–44)
AST: 22 U/L (ref 15–41)
Albumin: 4.1 g/dL (ref 3.5–5.0)
Alkaline Phosphatase: 100 U/L (ref 38–126)
Anion gap: 5 (ref 5–15)
BUN: 16 mg/dL (ref 6–20)
CO2: 28 mmol/L (ref 22–32)
Calcium: 9 mg/dL (ref 8.9–10.3)
Chloride: 105 mmol/L (ref 98–111)
Creatinine: 1.28 mg/dL — ABNORMAL HIGH (ref 0.61–1.24)
GFR, Estimated: >60 mL/min (ref >60)
Glucose, Bld: 103 mg/dL — ABNORMAL HIGH (ref 70–99)
Potassium: 4.3 mmol/L (ref 3.5–5.1)
Sodium: 138 mmol/L (ref 135–145)
Total Bilirubin: 0.5 mg/dL (ref <1.2)
Total Protein: 7.2 g/dL (ref 6.5–8.1)

## 2023-09-29 LAB — TSH: TSH: 5.128 u[IU]/mL — ABNORMAL HIGH (ref 0.350–4.500)

## 2023-09-30 ENCOUNTER — Emergency Department (HOSPITAL_COMMUNITY)
Admission: EM | Admit: 2023-09-30 | Discharge: 2023-09-30 | Disposition: A | Discharge status: Home / Self Care | Payer: BC Managed Care – PPO | Attending: Emergency Medicine | Admitting: Emergency Medicine

## 2023-09-30 ENCOUNTER — Ambulatory Visit (HOSPITAL_COMMUNITY)
Admission: RE | Admit: 2023-09-30 | Discharge: 2023-09-30 | Disposition: A | Discharge status: Home / Self Care | Payer: BC Managed Care – PPO | Source: Ambulatory Visit | Attending: Internal Medicine | Admitting: Internal Medicine

## 2023-09-30 ENCOUNTER — Encounter: Payer: Self-pay | Admitting: Internal Medicine

## 2023-09-30 ENCOUNTER — Other Ambulatory Visit: Payer: Self-pay

## 2023-09-30 ENCOUNTER — Encounter (HOSPITAL_COMMUNITY): Payer: Self-pay

## 2023-09-30 ENCOUNTER — Emergency Department (HOSPITAL_COMMUNITY): Payer: BC Managed Care – PPO

## 2023-09-30 ENCOUNTER — Telehealth: Payer: Self-pay | Admitting: Internal Medicine

## 2023-09-30 ENCOUNTER — Ambulatory Visit (HOSPITAL_COMMUNITY): Payer: BC Managed Care – PPO

## 2023-09-30 DIAGNOSIS — R519 Headache, unspecified: Secondary | ICD-10-CM | POA: Insufficient documentation

## 2023-09-30 DIAGNOSIS — Z85528 Personal history of other malignant neoplasm of kidney: Secondary | ICD-10-CM | POA: Insufficient documentation

## 2023-09-30 DIAGNOSIS — G9389 Other specified disorders of brain: Secondary | ICD-10-CM

## 2023-09-30 DIAGNOSIS — C649 Malignant neoplasm of unspecified kidney, except renal pelvis: Secondary | ICD-10-CM | POA: Insufficient documentation

## 2023-09-30 DIAGNOSIS — R42 Dizziness and giddiness: Secondary | ICD-10-CM | POA: Insufficient documentation

## 2023-09-30 DIAGNOSIS — R930 Abnormal findings on diagnostic imaging of skull and head, not elsewhere classified: Secondary | ICD-10-CM | POA: Diagnosis not present

## 2023-09-30 DIAGNOSIS — Z905 Acquired absence of kidney: Secondary | ICD-10-CM | POA: Diagnosis not present

## 2023-09-30 DIAGNOSIS — K7689 Other specified diseases of liver: Secondary | ICD-10-CM | POA: Diagnosis not present

## 2023-09-30 DIAGNOSIS — R918 Other nonspecific abnormal finding of lung field: Secondary | ICD-10-CM | POA: Diagnosis not present

## 2023-09-30 DIAGNOSIS — Z79899 Other long term (current) drug therapy: Secondary | ICD-10-CM | POA: Insufficient documentation

## 2023-09-30 DIAGNOSIS — C716 Malignant neoplasm of cerebellum: Secondary | ICD-10-CM | POA: Insufficient documentation

## 2023-09-30 DIAGNOSIS — E039 Hypothyroidism, unspecified: Secondary | ICD-10-CM | POA: Insufficient documentation

## 2023-09-30 DIAGNOSIS — I10 Essential (primary) hypertension: Secondary | ICD-10-CM | POA: Insufficient documentation

## 2023-09-30 DIAGNOSIS — G911 Obstructive hydrocephalus: Secondary | ICD-10-CM | POA: Diagnosis not present

## 2023-09-30 DIAGNOSIS — G935 Compression of brain: Secondary | ICD-10-CM | POA: Diagnosis not present

## 2023-09-30 DIAGNOSIS — G936 Cerebral edema: Secondary | ICD-10-CM | POA: Diagnosis not present

## 2023-09-30 LAB — COMPREHENSIVE METABOLIC PANEL
ALT: 25 U/L (ref 0–44)
AST: 29 U/L (ref 15–41)
Albumin: 4 g/dL (ref 3.5–5.0)
Alkaline Phosphatase: 102 U/L (ref 38–126)
Anion gap: 9 (ref 5–15)
BUN: 13 mg/dL (ref 6–20)
CO2: 26 mmol/L (ref 22–32)
Calcium: 9.3 mg/dL (ref 8.9–10.3)
Chloride: 105 mmol/L (ref 98–111)
Creatinine, Ser: 1.29 mg/dL — ABNORMAL HIGH (ref 0.61–1.24)
GFR, Estimated: >60 mL/min (ref >60)
Glucose, Bld: 110 mg/dL — ABNORMAL HIGH (ref 70–99)
Potassium: 3.9 mmol/L (ref 3.5–5.1)
Sodium: 140 mmol/L (ref 135–145)
Total Bilirubin: 0.6 mg/dL (ref <1.2)
Total Protein: 7.6 g/dL (ref 6.5–8.1)

## 2023-09-30 LAB — CBC WITH DIFFERENTIAL/PLATELET
Abs Immature Granulocytes: 0.01 10*3/uL (ref 0.00–0.07)
Basophils Absolute: 0 10*3/uL (ref 0.0–0.1)
Basophils Relative: 1 %
Eosinophils Absolute: 0.4 10*3/uL (ref 0.0–0.5)
Eosinophils Relative: 6 %
HCT: 42 % (ref 39.0–52.0)
Hemoglobin: 13.5 g/dL (ref 13.0–17.0)
Immature Granulocytes: 0 %
Lymphocytes Relative: 23 %
Lymphs Abs: 1.4 10*3/uL (ref 0.7–4.0)
MCH: 25.7 pg — ABNORMAL LOW (ref 26.0–34.0)
MCHC: 32.1 g/dL (ref 30.0–36.0)
MCV: 79.8 fL — ABNORMAL LOW (ref 80.0–100.0)
Monocytes Absolute: 0.5 10*3/uL (ref 0.1–1.0)
Monocytes Relative: 9 %
Neutro Abs: 3.7 10*3/uL (ref 1.7–7.7)
Neutrophils Relative %: 61 %
Platelets: 350 10*3/uL (ref 150–400)
RBC: 5.26 MIL/uL (ref 4.22–5.81)
RDW: 16.1 % — ABNORMAL HIGH (ref 11.5–15.5)
WBC: 6 10*3/uL (ref 4.0–10.5)
nRBC: 0 % (ref 0.0–0.2)

## 2023-09-30 MED ORDER — DEXAMETHASONE 4 MG PO TABS: 4.0000 mg | ORAL_TABLET | Freq: Three times a day (TID) | ORAL | 0 refills | Status: DC

## 2023-09-30 MED ORDER — GADOBUTROL 1 MMOL/ML IV SOLN
10.0000 mL | Freq: Once | INTRAVENOUS | Status: AC | PRN
  Administered 2023-09-30: 10 mL via INTRAVENOUS

## 2023-09-30 MED ORDER — DEXAMETHASONE SODIUM PHOSPHATE 10 MG/ML IJ SOLN
10.0000 mg | Freq: Once | INTRAMUSCULAR | Status: AC
  Administered 2023-09-30: 10 mg via INTRAVENOUS
  Filled 2023-09-30: qty 1

## 2023-09-30 MED ORDER — LORAZEPAM 2 MG/ML IJ SOLN
2.0000 mg | Freq: Once | INTRAMUSCULAR | Status: AC | PRN
  Administered 2023-09-30: 2 mg via INTRAVENOUS
  Filled 2023-09-30: qty 1

## 2023-09-30 NOTE — Telephone Encounter (Signed)
Pt being treated per Dr Shirline Frees for Met RCC and just took [place].  Has vague Sxs and CCT ordered.  CCT reported today showed - 1. 1.8 x 1.3 cm mass within/along the posterosuperior aspect of the right cerebellar hemisphere. This is most suspicious for an intracranial metastasis given the patient's history. Alternatively, a meningioma is possible but not favored.  Prominent surrounding edema within the right greater than left cerebellar hemispheres. Posterior fossa mass effect with partial effacement of the fourth ventricle, mass effect upon the brainstem and inferior displacement of the right cerebellar tonsil. Mild prominence of the lateral and third ventricle suspicious for obstructive hydrocephalus. A brain MRI (with and without contrast) is recommended for further characterization and to better assess for any additional lesions. 2. Right mastoid effusion.  After talking with [facility] and Onc we will send him to ED for Stat MRI.  He may need sedation due to Anxiety. Needs to start Decadron. Needs to consult [facility] - Dr Albin Felling - (should already be aware patient is coming to ED) May need Shunt vrs XRT vrs other Rx. Pt is aware and coming to ED.

## 2023-09-30 NOTE — Progress Notes (Signed)
I was notified about this patient for the first time this evening after MRI has been completed. Review of the EMR reveals patient to have hx of metastatic clear cell renal CA, at his usual state of health until a week or two ago and began having HA. Per ED MD, patient is otherwise neurologically well. I have reviewed his MRI done today which shows an ~1.5cm enhancing mass superficially within the right cerebellar hemisphere with associated edema. There is some mass effect on the fourth ventricle. No HCP. Most likely diagnosis is metastatic renal cell. As the patient appears to be neurologically well, I have recommended starting dexamethasone 4mg  Q 8hrs and I will see him in the outpatient setting in the next few days after the Holiday. He will likely require operative resection and pre- or post-op SRS.  Lisbeth Renshaw, MD [facility]

## 2023-09-30 NOTE — Discharge Instructions (Addendum)
It was our pleasure to provide your ER care today - we hope that you feel better.  Follow up with neurosurgery, Dr Conchita Paris in office this coming Monday - you may call office to verify appointment time.   Take decadron as prescribed.   Return to ER if worse, new symptoms, fevers, severe headache, persistent vomiting, numbness/weakness, or other concern.

## 2023-09-30 NOTE — ED Triage Notes (Signed)
Patient had CT head CT chest abd pelvs  Sent by Neurosurgery for stat MRI due to a mass in his brain.  Findings read as such  CCT reported today showed - 1. 1.8 x 1.3 cm mass within/along the posterosuperior aspect of the right cerebellar hemisphere. This is most suspicious for an intracranial metastasis given the patient's history. Alternatively, a meningioma is possible but not favored.  Prominent surrounding edema within the right greater than left cerebellar hemispheres. Posterior fossa mass effect with partial effacement of the fourth ventricle, mass effect upon the brainstem and inferior displacement of the right cerebellar tonsil. Mild prominence of the lateral and third ventricle suspicious for obstructive hydrocephalus. A brain MRI (with and without contrast) is recommended for further characterization and to better assess for any additional lesions. 2. Right mastoid effusion.  This was an incidental finding reports in July ran into a pole playing tennis and injury right shoulder but when they did a scan they noticed something with his cervical spine.    Hx of kidney cancer x 2 right removed left ablated and had mass in lung.  Patient was scheduled for recheck scans today but requested PCP to do CT head because he has been having headaches and dizziness unsteady gait for 10-15secs after standing.  Denies any symptoms right now and described pain in head as pressure when he has it.

## 2023-09-30 NOTE — ED Provider Notes (Signed)
[place] EMERGENCY DEPARTMENT AT [facility] Provider Note   CSN: [id number] Arrival date & time: 09/30/23  1158     History  Chief Complaint  Patient presents with   Abnormal Lab    Michael [name] is a 57 y.o. male.  Pt is a 57 yo male with pmhx significant for renal cell carcinoma, hypertension, sleep apnea, and hypothyroidism.  Pt just finished him immunotherapy for his cancer in July.  He's been having intermittent headaches and he did not feel right, so he went to his pcp who ordered a CT of his head, chest/abd/pelvis.  Pt said he's been functioning well and has been playing tennis and golf.  He played tennis last night and golf on Sunday (11/24).  He does not feel like 1 side is weaker than the other.  No h/a now.  Unfortunately, the CT head came back with a mass and surrounding edema.  Pcp spoke with NS who recommended sending pt to the ED and MRI.         Home Medications Prior to Admission medications   Medication Sig Start Date End Date Taking? Authorizing Provider  amLODipine (NORVASC) 5 MG tablet Take 7.5 mg by mouth in the morning. 04/23/21   [provider]  cholecalciferol (VITAMIN D3) 25 MCG (1000 UNIT) tablet Take 1,000 Units by mouth in the morning.    [provider]  levothyroxine (SYNTHROID) 137 MCG tablet TAKE 1 TABLET(137 MCG) BY MOUTH DAILY BEFORE BREAKFAST 08/04/23   Heilingoetter, Cassandra L, PA-C  montelukast (SINGULAIR) 10 MG tablet Take 10 mg by mouth in the morning.    [provider]  sodium chloride 0.9 % SOLN 50 mL with pembrolizumab 100 MG/4ML SOLN 200 mg Inject 200 mg into the vein every 21 ( twenty-one) days.    [provider]  tadalafil (CIALIS) 20 MG tablet Take 10-20 mg by mouth daily as needed for erectile dysfunction.    [provider]      Allergies    Clindamycin, Penicillins, and Tape    Review of Systems   Review of Systems  Neurological:  Positive for headaches.  All other  systems reviewed and are negative.   Physical Exam Updated Vital Signs BP (!) 157/96   Pulse 65   Temp 98.3 F (36.8 C)   Resp 13   Ht 5\' 6"  (1.676 m)   Wt 113.9 kg   SpO2 99%   BMI 40.51 kg/m  Physical Exam Vitals and nursing note reviewed.  Constitutional:      Appearance: Normal appearance.  HENT:     Head: Normocephalic and atraumatic.     Right Ear: External ear normal.     Left Ear: External ear normal.     Nose: Nose normal.     Mouth/Throat:     Mouth: Mucous membranes are moist.     Pharynx: Oropharynx is clear.  Eyes:     Extraocular Movements: Extraocular movements intact.     Conjunctiva/sclera: Conjunctivae normal.     Pupils: Pupils are equal, round, and reactive to light.  Cardiovascular:     Rate and Rhythm: Normal rate and regular rhythm.     Pulses: Normal pulses.     Heart sounds: Normal heart sounds.  Pulmonary:     Effort: Pulmonary effort is normal.     Breath sounds: Normal breath sounds.  Abdominal:     General: Abdomen is flat. Bowel sounds are normal.     Palpations: Abdomen is soft.  Musculoskeletal:        General: Normal range of motion.     Cervical back: Normal range of motion and neck supple.  Skin:    General: Skin is warm.     Capillary Refill: Capillary refill takes less than 2 seconds.  Neurological:     General: No focal deficit present.     Mental Status: He is alert and oriented to person, place, and time.  Psychiatric:        Mood and Affect: Mood normal.        Behavior: Behavior normal.     ED Results / Procedures / Treatments   Labs (all labs ordered are listed, but only abnormal results are displayed) Labs Reviewed  COMPREHENSIVE METABOLIC PANEL - Abnormal; Notable for the following components:      Result Value   Glucose, Bld 110 (*)    Creatinine, Ser 1.29 (*)    All other components within normal limits  CBC WITH DIFFERENTIAL/PLATELET - Abnormal; Notable for the following components:   MCV 79.8 (*)     MCH 25.7 (*)    RDW 16.1 (*)    All other components within normal limits    EKG EKG Interpretation Date/Time:  Tuesday September 30 2023 13:15:02 EST Ventricular Rate:  65 PR Interval:  154 QRS Duration:  102 QT Interval:  392 QTC Calculation: 408 R Axis:   58  Text Interpretation: Sinus rhythm Low voltage, precordial leads Borderline T abnormalities, inferior leads No significant change since last tracing Confirmed by Jacalyn Lefevre [phone]) on 09/30/2023 1:29:50 PM  Radiology CT Chest Wo Contrast  Result Date: 09/30/2023 CLINICAL DATA:  History of metastatic renal cell carcinoma, follow-up. * Tracking Code: BO * EXAM: CT CHEST, ABDOMEN AND PELVIS WITHOUT CONTRAST TECHNIQUE: Multidetector CT imaging of the chest, abdomen and pelvis was performed following the standard protocol without IV contrast. RADIATION DOSE REDUCTION: This exam was performed according to the departmental dose-optimization program which includes automated exposure control, adjustment of the mA and/or kV according to patient size and/or use of iterative reconstruction technique. COMPARISON:  Multiple priors including most recent CT May 29, 2023 FINDINGS: CT CHEST FINDINGS Cardiovascular: Normal caliber thoracic aorta. Normal size heart. No significant pericardial effusion/thickening. Mediastinum/Nodes: No suspicious thyroid nodule. No pathologically enlarged thoracic lymph nodes noting limited evaluation of the hilar structures on noncontrast enhanced examination. The esophagus is grossly unremarkable. Lungs/Pleura: Stable 4 mm right lower lobe pulmonary nodule on image 83/7. Increased conspicuity of a 2 mm perifissural right lower lobe pulmonary nodule on image 70/7. No new suspicious pulmonary nodules or masses. Musculoskeletal: No aggressive lytic or blastic lesion of bone. CT ABDOMEN PELVIS FINDINGS Hepatobiliary: Bilobar hepatic cysts. No new suspicious hepatic lesion on noncontrast enhanced examination. Gallbladder is  unremarkable. No biliary ductal dilation. Pancreas: No pancreatic ductal dilation or evidence of acute inflammation. Spleen: No splenomegaly. Adrenals/Urinary Tract: Bilateral adrenal glands appear normal. Right kidney surgically absent without new suspicious nodularity in the nephrectomy bed. Left interpolar ablation site is similar prior measuring 1.9 x 1.7 cm on image 68/3, unchanged. Urinary bladder is unremarkable for degree of distension. Stomach/Bowel: No radiopaque enteric contrast material was administered. Stomach is unremarkable for degree of distension. No pathologic dilation of small or large bowel. No evidence of acute bowel inflammation. Vascular/Lymphatic: Normal caliber abdominal aorta. Smooth IVC contours. Stable hepatoduodenal ligament lymph node measuring 1 cm on image 59/3. No progressive abdominopelvic adenopathy. Reproductive: Prostate is unremarkable. Other: No significant abdominopelvic free fluid. Musculoskeletal: No aggressive lytic  or blastic lesion of bone. IMPRESSION: 1. Stable examination without convincing noncontrast enhanced CT evidence of local recurrence or metastatic disease in the chest, abdomen or pelvis. 2. Increased conspicuity of a 2 mm right lower lobe pulmonary nodule likely an intrapulmonary lymph node. Suggest attention on follow-up imaging. 3. Stable 4 mm right lower lobe pulmonary nodule, favored benign. 4. Stable hepatoduodenal ligament lymph node. Electronically Signed   By: Maudry Mayhew M.D.   On: 09/30/2023 12:35   CT ABDOMEN PELVIS WO CONTRAST  Result Date: 09/30/2023 CLINICAL DATA:  History of metastatic renal cell carcinoma, follow-up. * Tracking Code: BO * EXAM: CT CHEST, ABDOMEN AND PELVIS WITHOUT CONTRAST TECHNIQUE: Multidetector CT imaging of the chest, abdomen and pelvis was performed following the standard protocol without IV contrast. RADIATION DOSE REDUCTION: This exam was performed according to the departmental dose-optimization program which  includes automated exposure control, adjustment of the mA and/or kV according to patient size and/or use of iterative reconstruction technique. COMPARISON:  Multiple priors including most recent CT May 29, 2023 FINDINGS: CT CHEST FINDINGS Cardiovascular: Normal caliber thoracic aorta. Normal size heart. No significant pericardial effusion/thickening. Mediastinum/Nodes: No suspicious thyroid nodule. No pathologically enlarged thoracic lymph nodes noting limited evaluation of the hilar structures on noncontrast enhanced examination. The esophagus is grossly unremarkable. Lungs/Pleura: Stable 4 mm right lower lobe pulmonary nodule on image 83/7. Increased conspicuity of a 2 mm perifissural right lower lobe pulmonary nodule on image 70/7. No new suspicious pulmonary nodules or masses. Musculoskeletal: No aggressive lytic or blastic lesion of bone. CT ABDOMEN PELVIS FINDINGS Hepatobiliary: Bilobar hepatic cysts. No new suspicious hepatic lesion on noncontrast enhanced examination. Gallbladder is unremarkable. No biliary ductal dilation. Pancreas: No pancreatic ductal dilation or evidence of acute inflammation. Spleen: No splenomegaly. Adrenals/Urinary Tract: Bilateral adrenal glands appear normal. Right kidney surgically absent without new suspicious nodularity in the nephrectomy bed. Left interpolar ablation site is similar prior measuring 1.9 x 1.7 cm on image 68/3, unchanged. Urinary bladder is unremarkable for degree of distension. Stomach/Bowel: No radiopaque enteric contrast material was administered. Stomach is unremarkable for degree of distension. No pathologic dilation of small or large bowel. No evidence of acute bowel inflammation. Vascular/Lymphatic: Normal caliber abdominal aorta. Smooth IVC contours. Stable hepatoduodenal ligament lymph node measuring 1 cm on image 59/3. No progressive abdominopelvic adenopathy. Reproductive: Prostate is unremarkable. Other: No significant abdominopelvic free fluid.  Musculoskeletal: No aggressive lytic or blastic lesion of bone. IMPRESSION: 1. Stable examination without convincing noncontrast enhanced CT evidence of local recurrence or metastatic disease in the chest, abdomen or pelvis. 2. Increased conspicuity of a 2 mm right lower lobe pulmonary nodule likely an intrapulmonary lymph node. Suggest attention on follow-up imaging. 3. Stable 4 mm right lower lobe pulmonary nodule, favored benign. 4. Stable hepatoduodenal ligament lymph node. Electronically Signed   By: Maudry Mayhew M.D.   On: 09/30/2023 12:35   CT HEAD WO CONTRAST ( )  Addendum Date: 09/30/2023   ADDENDUM REPORT: 09/30/2023 11:13 ADDENDUM: These results were called by telephone at the time of interpretation on 09/30/2023 at 11:00 am to provider Dr. Timothy Lasso, who verbally acknowledged these results. Electronically Signed   By: Jackey Loge D.O.   On: 09/30/2023 11:13   Result Date: 09/30/2023 CLINICAL DATA:  Provided history: Frequent headaches. Postural dizziness. Additional history provided: Intermittent pain at skull base/occipital region, history of metastatic renal cancer EXAM: CT HEAD WITHOUT CONTRAST TECHNIQUE: Contiguous axial images were obtained from the base of the skull through the vertex without intravenous contrast.  RADIATION DOSE REDUCTION: This exam was performed according to the departmental dose-optimization program which includes automated exposure control, adjustment of the mA and/or kV according to patient size and/or use of iterative reconstruction technique. COMPARISON:  None. FINDINGS: Brain: 1.8 x 1.3 cm hyperdense mass within/along the posterosuperior aspect of the right cerebellar hemisphere (for instance as seen on series 6, image 59). Adjacent 10 mm cystic focus. Prominent surrounding edema within the right greater than left cerebellar hemispheres. Posterior fossa mass effect with partial effacement of the fourth ventricle and mass effect upon the brainstem. The right  cerebellar tonsil is inferiorly displaced and extends caudally beyond the field of view. Mild prominence of the lateral and third ventricles suspicious for obstructive hydrocephalus. No demarcated cortical infarct. No extra-axial fluid collection. No supratentorial midline shift. Vascular: No hyperdense vessel. Skull: No calvarial fracture or aggressive osseous lesion. Sinuses/Orbits: No mass or acute finding within the imaged orbits. No significant paranasal sinus disease at the imaged levels. Other: Right mastoid effusion. Attempts are being made to reach the ordering provider at this time. IMPRESSION: 1. 1.8 x 1.3 cm mass within/along the posterosuperior aspect of the right cerebellar hemisphere. This is most suspicious for an intracranial metastasis given the patient's history. Alternatively, a meningioma is possible but not favored. Prominent surrounding edema within the right greater than left cerebellar hemispheres. Posterior fossa mass effect with partial effacement of the fourth ventricle, mass effect upon the brainstem and inferior displacement of the right cerebellar tonsil. Mild prominence of the lateral and third ventricle suspicious for obstructive hydrocephalus. A brain MRI (with and without contrast) is recommended for further characterization and to better assess for any additional lesions. 2. Right mastoid effusion. Electronically Signed: By: Jackey Loge D.O. On: 09/30/2023 10:13    Procedures Procedures    Medications Ordered in ED Medications  LORazepam (ATIVAN) injection 2 mg (has no administration in time range)    ED Course/ Medical Decision Making/ A&P                                 Medical Decision Making Amount and/or Complexity of Data Reviewed Labs: ordered.  Risk Prescription drug management.   This patient presents to the ED for concern of headache, this involves an extensive number of treatment options, and is a complaint that carries with it a high risk of  complications and morbidity.  The differential diagnosis includes migraine, concussion, tumor   Co morbidities that complicate the patient evaluation  renal cell carcinoma, hypertension, sleep apnea, and hypothyroidism   Additional history obtained:  Additional history obtained from epic chart review External records from outside source obtained and reviewed including wife   Lab Tests:  I Ordered, and personally interpreted labs.  The pertinent results include:  cbc nl, cmp cr 1.29 (chronic)   Imaging Studies ordered:  I ordered imaging studies including mri brain  Pending at shift change   Cardiac Monitoring:  The patient was maintained on a cardiac monitor.  I personally viewed and interpreted the cardiac monitored which showed an underlying rhythm of: nsr   Medicines ordered and prescription drug management:   I have reviewed the patients home medicines and have made adjustments as needed   Test Considered:  mri   Problem List / ED Course:  Abnormal CT head with hx of renal cell cancer:  MRI pending.  When MRI is done, Dr. Conchita Paris needs to be called.  Reevaluation:  After the interventions noted above, I reevaluated the patient and found that they have :improved   Social Determinants of Health:  Lives at home   Dispostion:  Pending at shift change.        Final Clinical Impression(s) / ED Diagnoses Final diagnoses:  Abnormal CT of the head    Rx / DC Orders ED Discharge Orders     None         Jacalyn Lefevre, MD 09/30/23 1504

## 2023-09-30 NOTE — ED Notes (Signed)
Pt returned from MRI °

## 2023-09-30 NOTE — ED Provider Notes (Signed)
Signed out to check mri when resulted. Mri w cerebellar mass w edema/mass effect. NS consulted - discussed pt with Dr Conchita Paris who reviewed MRI - he indicates give rx for home for decadron 4 mg q 8 hours, and have f/u with him in office this Monday, he will make appt.   Recheck pt, comfortable, no headache, no neuro c/o. No nv. Discussed mri and plan, pt/spouse agreeable. Decadron dose given in ED iv. Rx for home.   Return precautions provided.    Cathren Laine, MD 09/30/23 2111

## 2023-09-30 NOTE — ED Notes (Signed)
Patient transported to MRI 

## 2023-10-01 ENCOUNTER — Other Ambulatory Visit: Payer: Self-pay

## 2023-10-01 DIAGNOSIS — G4733 Obstructive sleep apnea (adult) (pediatric): Secondary | ICD-10-CM | POA: Diagnosis not present

## 2023-10-06 ENCOUNTER — Other Ambulatory Visit: Payer: Self-pay | Admitting: Radiation Therapy

## 2023-10-06 ENCOUNTER — Ambulatory Visit: Payer: BC Managed Care – PPO | Admitting: Physician Assistant

## 2023-10-06 ENCOUNTER — Inpatient Hospital Stay: Payer: BC Managed Care – PPO | Admitting: Physician Assistant

## 2023-10-06 DIAGNOSIS — Z6841 Body Mass Index (BMI) 40.0 and over, adult: Secondary | ICD-10-CM | POA: Diagnosis not present

## 2023-10-06 DIAGNOSIS — C7931 Secondary malignant neoplasm of brain: Secondary | ICD-10-CM | POA: Diagnosis not present

## 2023-10-07 ENCOUNTER — Ambulatory Visit
Admission: RE | Admit: 2023-10-07 | Discharge: 2023-10-07 | Disposition: A | Discharge status: Home / Self Care | Payer: BC Managed Care – PPO | Source: Ambulatory Visit | Attending: Radiation Oncology | Admitting: Radiation Oncology

## 2023-10-07 ENCOUNTER — Ambulatory Visit (HOSPITAL_COMMUNITY)
Admission: RE | Admit: 2023-10-07 | Discharge: 2023-10-07 | Disposition: A | Discharge status: Home / Self Care | Payer: BC Managed Care – PPO | Source: Ambulatory Visit | Attending: Radiation Oncology | Admitting: Radiation Oncology

## 2023-10-07 ENCOUNTER — Other Ambulatory Visit: Payer: Self-pay | Admitting: Urology

## 2023-10-07 ENCOUNTER — Other Ambulatory Visit: Payer: Self-pay | Admitting: Radiation Therapy

## 2023-10-07 ENCOUNTER — Other Ambulatory Visit: Payer: Self-pay

## 2023-10-07 ENCOUNTER — Encounter: Payer: Self-pay | Admitting: Radiation Oncology

## 2023-10-07 ENCOUNTER — Ambulatory Visit: Payer: BC Managed Care – PPO

## 2023-10-07 VITALS — BP 129/90 | HR 65 | Temp 97.7°F | Resp 18 | Ht 66.0 in | Wt 254.4 lb

## 2023-10-07 DIAGNOSIS — G919 Hydrocephalus, unspecified: Secondary | ICD-10-CM | POA: Diagnosis not present

## 2023-10-07 DIAGNOSIS — G4730 Sleep apnea, unspecified: Secondary | ICD-10-CM | POA: Insufficient documentation

## 2023-10-07 DIAGNOSIS — K754 Autoimmune hepatitis: Secondary | ICD-10-CM | POA: Insufficient documentation

## 2023-10-07 DIAGNOSIS — C7931 Secondary malignant neoplasm of brain: Secondary | ICD-10-CM | POA: Insufficient documentation

## 2023-10-07 DIAGNOSIS — G911 Obstructive hydrocephalus: Secondary | ICD-10-CM | POA: Insufficient documentation

## 2023-10-07 DIAGNOSIS — I10 Essential (primary) hypertension: Secondary | ICD-10-CM | POA: Insufficient documentation

## 2023-10-07 DIAGNOSIS — Z7952 Long term (current) use of systemic steroids: Secondary | ICD-10-CM | POA: Insufficient documentation

## 2023-10-07 DIAGNOSIS — C649 Malignant neoplasm of unspecified kidney, except renal pelvis: Secondary | ICD-10-CM | POA: Diagnosis not present

## 2023-10-07 DIAGNOSIS — G935 Compression of brain: Secondary | ICD-10-CM | POA: Diagnosis not present

## 2023-10-07 DIAGNOSIS — E039 Hypothyroidism, unspecified: Secondary | ICD-10-CM | POA: Insufficient documentation

## 2023-10-07 DIAGNOSIS — R609 Edema, unspecified: Secondary | ICD-10-CM | POA: Insufficient documentation

## 2023-10-07 DIAGNOSIS — Z79899 Other long term (current) drug therapy: Secondary | ICD-10-CM | POA: Insufficient documentation

## 2023-10-07 DIAGNOSIS — Z79890 Hormone replacement therapy (postmenopausal): Secondary | ICD-10-CM | POA: Diagnosis not present

## 2023-10-07 DIAGNOSIS — R918 Other nonspecific abnormal finding of lung field: Secondary | ICD-10-CM | POA: Insufficient documentation

## 2023-10-07 DIAGNOSIS — G939 Disorder of brain, unspecified: Secondary | ICD-10-CM | POA: Diagnosis not present

## 2023-10-07 MED ORDER — GADOBUTROL 1 MMOL/ML IV SOLN
10.0000 mL | Freq: Once | INTRAVENOUS | Status: AC | PRN
  Administered 2023-10-07: 10 mL via INTRAVENOUS

## 2023-10-07 MED ORDER — LORAZEPAM 1 MG PO TABS: 1.0000 mg | ORAL_TABLET | ORAL | 0 refills | Status: DC | PRN

## 2023-10-07 NOTE — Progress Notes (Signed)
Location/Histology of Brain Tumor: Right Cerebellar Hemisphere  Patient presented with symptoms of:  Frequent headaches & Postural dizziness.  09/30/2023 Delice Bison, PA-C MR Brain with/without Contrast CLNICAL DATA: Brain metastases. History of renal cell carcinoma.   IMPRESSION: 3 cm right cerebellar mass with extensive edema and associated mass effect as detailed above including mild obstructive hydrocephalus. This is most concerning for a solitary metastasis with a primary CNS neoplasm (such as hemangioblastoma) also possible.  09/30/2023 Brittney Janice Coffin, NP CT Head without Contrast CLINICAL:  Provided history: Frequent headaches. Postural dizziness. Additional history provided: Intermittent pain at skull base/occipital region, history of metastatic renal cancer.   IMPRESSION: 1. 1.8 x 1.3 cm mass within/along the posterosuperior aspect of the right cerebellar hemisphere. This is most suspicious for an intracranial metastasis given the patient's history. Alternatively, a meningioma is possible but not favored. Prominent surrounding edema within the right greater than left cerebellar hemispheres. Posterior fossa mass effect with partial effacement of the fourth ventricle, mass effect upon the brainstem and inferior displacement of the right cerebellar tonsil. Mild prominence of the lateral and third ventricle suspicious for obstructive hydrocephalus. A brain MRI (with and without contrast) is recommended for further characterization and to better assess for any additional lesions. 2. Right mastoid effusion.   Past or anticipated interventions, if any, per neurosurgery:  09/30/2023 Dr. Lisbeth Renshaw Review of the EMR reveals patient to have hx of metastatic clear cell renal CA, at his usual state of health until a week or two ago and began having HA. Per ED MD, patient is otherwise neurologically well. I have reviewed his MRI done today which shows an  ~1.5cm enhancing mass superficially within the right cerebellar hemisphere with associated edema. There is some mass effect on the fourth ventricle. No HCP. Most likely diagnosis is metastatic renal cell. As the patient appears to be neurologically well, I have recommended starting dexamethasone 4mg  Q 8hrs and I will see him in the outpatient setting in the next few days after the Holiday. He will likely require operative resection and pre- or post-op SRS.    Past or anticipated interventions, if any, per medical oncology:  06/03/2023 Dr. Arbutus Ped Assessment and Plan: This is a very pleasant 57 years old white male with Stage IV clear-cell renal cell carcinoma initially diagnosed as locally advanced disease in 2020 with evidence of pulmonary metastasis in 2021.  He is status post right nephrectomy on August 27, 2019 in [place] and the final pathology showed clear-cell renal cell carcinoma nuclear grade 3 with 10% necrosis and negative margin with the final pathologic stage of T2a, NX. The patient is status post treatment with [facility] and axitinib started March 2021 for 2 cycles interrupted because of autoimmune hepatitis. He then  started Axitinib 5 mg daily on July 05, 2020 discontinued after 1 week because of elevated LFTs. He is also status post cryoablation of left renal mass on May 30, 2021. The patient is currently undergoing treatment with Keytruda 200 Mg IV every 3 weeks started July 27, 2021 and then switch it to 400 Mg every 6 weeks on November 09, 2021. The patient has been tolerating this treatment well with no concerning adverse effects except for the dry mouth. The patient is feeling fine today with no concerning complaints except for the right shoulder pain from recent trauma. He had repeat CT scan of the chest, abdomen and pelvis performed recently.  I personally and independently reviewed the scan and discussed the result with the patient and  his wife. His scan showed no  concerning findings for recurrent or metastatic disease. I recommended for him to complete the last cycle of his treatment with immunotherapy today as planned.  By the cycle he would have completed 2 years of treatment with immunotherapy. I will see him back for follow-up visit in 4 months for evaluation with repeat CT scan of the chest, abdomen and pelvis for restaging of his disease. The patient was advised to call immediately if he has any concerning symptoms in the interval. The patient voices understanding of current disease status and treatment options and is in agreement with the current care plan.   Dose of Decadron, if applicable: Dexamethasone 4 mg po every 8 hours, starting 09/30/2023.  Recent neurologic symptoms, if any:  Seizures: No Headaches: Yes, due to neck injury in summer subside then started again in September. Nausea: No Dizziness/ataxia: No Difficulty with hand coordination:  No Focal numbness/weakness: No Visual deficits/changes: No Confusion/Memory deficits: No  Painful bone metastases at present, if any: No  SAFETY ISSUES: Prior radiation?  No Pacemaker/ICD? No Possible current pregnancy? Male Is the patient on methotrexate? No  Additional Complaints / other details:

## 2023-10-07 NOTE — Progress Notes (Signed)
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 Metastasis to brain [facility])  C79.31     2. Metastatic renal cell carcinoma to brain [facility])  C79.31    C64.9       HISTORY OF PRESENT ILLNESS: An Michael [name] is a 57 y.o. male seen at the request of Dr. Conchita Paris. He has a history of clear-cell renal cell carcinoma, initially diagnosed in 2020. He was found to have locally advanced disease (pT2aNx) at the time of the right nephrectomy on 08/27/19 in [place]. He was found to have pulmonary metastases with a 1.2 cm LLL nodule and multiple small pulmonary nodules with associated lymphadenopathy on follow up surveillance imaging in 2021 and confirmed on bronch/EBUS at that time. He was subsequently treated with Keytruda and axitinib, started 01/28/20. This treatment was interrupted due to autoimmune hepatitis after only 2 cycles.  He relocated to [facility] and July 2021 and established care with Dr. Clelia Croft.  They attempted to resume Axitinib on 07/05/20, but this was discontinued after only 10 days of treatment, due to hepatotoxicity with elevated LFTs. He developed an enhancing left renal mass on imaging in June 2022 that was subsequently treated with percutaneous cryoablation x2 (staged treatment) in July 2022. He resumed his single agent Keytruda immunotherapy on 07/27/21, and completed 2 years of treatment with his last dose on 06/03/23. He is now on observation only, under the care of Dr. Shirline Frees.  Most recently, he presented to his PCP with complaints of intermittent headaches that were occurring more frequently, prompting further evaluation with a CT head  scan. This scan was performed on 09/30/23 and revealed a 1.8 cm mass within/along the posterosuperior aspect of the right cerebellar hemisphere, most suspicious for an intracranial metastasis. He was referred to the ED the same day and underwent brain MRI at that time. MRI confirmed a 3 cm right cerebellar mass with extensive edema and associated mass effect, including mild obstructive hydrocephalus. He was started on decadron in the ED and was discharged home with outpatient neurosurgical follow-up.  He met with Dr. Conchita Paris on 10/06/2023 who recommended preop SRS followed by surgical resection.  A treatment planning SRS protocol MRI brain scan was performed earlier today and shows an interval decrease in size of the right cerebellar lesion, now measuring approximately 2.1 cm with mildly improved surrounding edema/mass effect with decreased effacement of the fourth ventricle.  Previously seen hydrocephalus has resolved and no new lesions were identified.  He has been kindly referred to [place] today to discuss the potential role of stereotactic radiosurgery [facility]) and is accompanied by his wife, Michael [name], who is a prior Herbalist.  We reviewed the findings of the recent MRI brain scan with them today in the office.  PREVIOUS RADIATION THERAPY: No  PAST MEDICAL HISTORY:  Past Medical History:  Diagnosis Date   Cancer [facility])    Renal cell carcinoma   Hypertension    Hypothyroidism    Sleep apnea    wears cpap      PAST SURGICAL HISTORY: Past Surgical History:  Procedure Laterality Date   IR RADIOLOGIST EVAL & MGMT  04/04/2021   no  procedures performed   IR RADIOLOGIST EVAL & MGMT  05/08/2021   no procedures performed   IR RADIOLOGIST EVAL & MGMT  08/12/2022   LUNG BIOPSY  2021   RADIOLOGY WITH ANESTHESIA N/A 05/30/2021   Procedure: CT WITH ANESTHESIA CRYOABLATION;  Surgeon: Irish Lack, MD;  Location: [facility];  Service: Radiology;  Laterality: N/A;   right nephrectomy Right 08/2019    TONSILLECTOMY      FAMILY HISTORY: No family history on file.  SOCIAL HISTORY:  Social History   Socioeconomic History   Marital status: Married    Spouse name: Not on file   Number of children: Not on file   Years of education: Not on file   Highest education level: Not on file  Occupational History   Not on file  Tobacco Use   Smoking status: Never   Smokeless tobacco: Never  Vaping Use   Vaping status: Never Used  Substance and Sexual Activity   Alcohol use: Not Currently    Comment: occasional beer or wine 1 x a month   Drug use: Never   Sexual activity: Yes    Birth control/protection: None  Other Topics Concern   Not on file  Social History Narrative   Not on file   Social Determinants of Health   Financial Resource Strain: Low Risk  (08/29/2019)   Received from [employer], [facility]   Overall Financial Resource Strain (CARDIA)    Difficulty of Paying Living Expenses: Not hard at all  Food Insecurity: No Food Insecurity (10/07/2023)   Hunger Vital Sign    Worried About Running Out of Food in the Last Year: Never true    Ran Out of Food in the Last Year: Never true  Transportation Needs: No Transportation Needs (10/07/2023)   PRAPARE - Administrator, Civil Service (Medical): No    Lack of Transportation (Non-Medical): No  Physical Activity: Insufficiently Active (08/29/2019)   Received from [facility], [facility]   Exercise Vital Sign    Days of Exercise per Week: 7 days    Minutes of Exercise per Session: 20 min  Stress: No Stress Concern Present (08/29/2019)   Received from [employer], [facility]   [employer] of Occupational Health - Occupational Stress Questionnaire    Feeling of Stress : Not at all  Social Connections: Unknown (05/11/2022)   Received from [facility], [facility]   Social Network    Social Network: Not on file  Intimate Partner Violence: Not At Risk  (10/07/2023)   Humiliation, Afraid, Rape, and Kick questionnaire    Fear of Current or Ex-Partner: No    Emotionally Abused: No    Physically Abused: No    Sexually Abused: No    ALLERGIES: Cleocin [clindamycin], Penicillins, and Tape  MEDICATIONS:  Current Outpatient Medications  Medication Sig Dispense Refill   amLODipine (NORVASC) 5 MG tablet Take 7.5 mg by mouth daily before breakfast.     dexamethasone (DECADRON) 4 MG tablet Take 1 tablet (4 mg total) by mouth every 8 (eight) hours. 30 tablet 0   levothyroxine (SYNTHROID) 137 MCG tablet TAKE 1 TABLET(137 MCG) BY MOUTH DAILY BEFORE BREAKFAST 30 tablet 2   LORazepam (ATIVAN) 1 MG tablet Take 1 tablet (1 mg total) by mouth as needed for anxiety (take one tablet 30 minutes prior to MRI and may repeat once, just prior to scan, if needed). 10 tablet 0   methocarbamol (ROBAXIN) 500 MG tablet Take 500 mg by mouth  every 8 (eight) hours as needed for muscle spasms.     montelukast (SINGULAIR) 10 MG tablet Take 10 mg by mouth daily before breakfast.     tadalafil (CIALIS) 20 MG tablet Take 10-20 mg by mouth daily as needed for erectile dysfunction.     No current facility-administered medications for this encounter.    REVIEW OF SYSTEMS:  On review of systems, the patient reports that he is doing well overall.  He has not had any further headaches or dizziness since starting the Decadron.  He denies any chest pain, shortness of breath, cough, fevers, chills, night sweats, or recent unintended weight changes.  He denies any changes in visual or auditory acuity, difficulty with speech, focal weakness or paresthesias.  He denies any bowel or bladder disturbances, and denies abdominal pain, nausea or vomiting. He denies any new musculoskeletal or joint aches or pains.  A complete review of systems is obtained and is otherwise negative.    PHYSICAL EXAM:  Wt Readings from Last 3 Encounters:  10/07/23 254 lb 6 oz (115.4 kg)  09/30/23 251 lb (113.9  kg)  06/03/23 251 lb 9.6 oz (114.1 kg)   Temp Readings from Last 3 Encounters:  10/07/23 97.7 F (36.5 C) (Temporal)  09/30/23 98 F (36.7 C) (Oral)  06/03/23 97.9 F (36.6 C) (Temporal)   BP Readings from Last 3 Encounters:  10/07/23 (!) 129/90  09/30/23 138/80  06/03/23 123/83   Pulse Readings from Last 3 Encounters:  10/07/23 65  09/30/23 87  06/03/23 (!) 54   Pain Assessment Pain Score: 0-No pain/10  In general this is a well appearing Caucasian man in no acute distress. He's alert and oriented x4 and appropriate throughout the examination. Cardiopulmonary assessment is negative for acute distress and he exhibits normal effort.     KPS = 100  100 - Normal; no complaints; no evidence of disease. 90   - Able to carry on normal activity; minor signs or symptoms of disease. 80   - Normal activity with effort; some signs or symptoms of disease. 74   - Cares for self; unable to carry on normal activity or to do active work. 60   - Requires occasional assistance, but is able to care for most of his personal needs. 50   - Requires considerable assistance and frequent medical care. 40   - Disabled; requires special care and assistance. 30   - Severely disabled; hospital admission is indicated although death not imminent. 20   - Very sick; hospital admission necessary; active supportive treatment necessary. 10   - Moribund; fatal processes progressing rapidly. 0     - Dead  Karnofsky DA, Abelmann WH, Craver LS and Burchenal [facility] [phone]) The use of the nitrogen mustards in the palliative treatment of carcinoma: with particular reference to bronchogenic carcinoma Cancer 1 634-56  LABORATORY DATA:  Lab Results  Component Value Date   WBC 6.0 09/30/2023   HGB 13.5 09/30/2023   HCT 42.0 09/30/2023   MCV 79.8 (L) 09/30/2023   PLT 350 09/30/2023   Lab Results  Component Value Date   NA 140 09/30/2023   K 3.9 09/30/2023   CL 105 09/30/2023   CO2 26 09/30/2023   Lab Results   Component Value Date   ALT 25 09/30/2023   AST 29 09/30/2023   ALKPHOS 102 09/30/2023   BILITOT 0.6 09/30/2023     RADIOGRAPHY: MR Brain W Wo Contrast  Result Date: 10/07/2023 CLINICAL DATA:  Metastatic disease evaluation  3T SRS and stereo protocol for surgical and treatment planning. (Pre-Op SRS) EXAM: MRI HEAD WITHOUT AND WITH CONTRAST TECHNIQUE: Multiplanar, multiecho pulse sequences of the brain and surrounding structures were obtained without and with intravenous contrast. CONTRAST:  10mL GADAVIST GADOBUTROL 1 MMOL/ML IV SOLN COMPARISON:  September 30, 2023. FINDINGS: Brain: Interval decrease in size of the enhancing right cerebellar lesion, now measuring approximately 2.1 x 2.1 cm (previously 2.7 x 2.5 cm when remeasured similarly). No new enhancing lesions identified. Mildly improved surrounding edema and mass effect with decreased effacement of the fourth ventricle. Previously seen hydrocephalus has resolved. No evidence of acute infarct, acute hemorrhage, or extra-axial fluid collection. Vascular: Major arterial flow voids are maintained at the skull base. Skull and upper cervical spine: Normal marrow signal. Sinuses/Orbits: Clear sinuses.  No acute orbital findings. Other: Right mastoid effusions. IMPRESSION: 1. Interval decrease in size of the right cerebellar lesion, now measuring approximately 2.1. Mildly improved surrounding edema and mass effect with decreased effacement of the fourth ventricle. Previously seen hydrocephalus as resolved. 2. No new lesions identified. Electronically Signed   By: Feliberto Harts M.D.   On: 10/07/2023 12:59   MR Brain W and Wo Contrast  Result Date: 09/30/2023 CLINICAL DATA:  Brain metastases.  History of renal cell carcinoma. EXAM: MRI HEAD WITHOUT AND WITH CONTRAST TECHNIQUE: Multiplanar, multiecho pulse sequences of the brain and surrounding structures were obtained without and with intravenous contrast. CONTRAST:  10mL GADAVIST GADOBUTROL 1 MMOL/ML  IV SOLN COMPARISON:  Head CT 09/30/2023 FINDINGS: Brain: There is a 2.7 x 3.0 x 1.8 cm heterogeneously enhancing mass in the posterior right cerebellar hemisphere which appears partially necrotic/cystic with chronic blood products. There is extensive vasogenic edema throughout the right cerebellar hemisphere which mildly extends into the left cerebellar hemisphere. There is associated posterior fossa mass effect with effacement of the fourth ventricle, anterior displacement and mild compression of the midbrain and pons, and mild cerebellar tonsillar herniation with the tonsils extending 7 mm below the foramen magnum. The lateral and third ventricles are mildly dilated consistent with obstructive hydrocephalus, and there is mild periventricular T2 hyperintensity likely reflecting mild transependymal CSF flow. No acute infarct, supratentorial mass effect, or extra-axial fluid collection is evident. No second enhancing intracranial lesion is identified. Vascular: Major intracranial vascular flow voids are preserved. Skull and upper cervical spine: No suspicious marrow lesion. Sinuses/Orbits: Unremarkable orbits. Mild mucosal thickening in the ethmoid sinuses. Small right mastoid effusion. Other: None. IMPRESSION: 3 cm right cerebellar mass with extensive edema and associated mass effect as detailed above including mild obstructive hydrocephalus. This is most concerning for a solitary metastasis with a primary CNS neoplasm (such as hemangioblastoma) also possible. Electronically Signed   By: Sebastian Ache M.D.   On: 09/30/2023 20:04   CT Chest Wo Contrast  Result Date: 09/30/2023 CLINICAL DATA:  History of metastatic renal cell carcinoma, follow-up. * Tracking Code: BO * EXAM: CT CHEST, ABDOMEN AND PELVIS WITHOUT CONTRAST TECHNIQUE: Multidetector CT imaging of the chest, abdomen and pelvis was performed following the standard protocol without IV contrast. RADIATION DOSE REDUCTION: This exam was performed according  to the departmental dose-optimization program which includes automated exposure control, adjustment of the mA and/or kV according to patient size and/or use of iterative reconstruction technique. COMPARISON:  Multiple priors including most recent CT May 29, 2023 FINDINGS: CT CHEST FINDINGS Cardiovascular: Normal caliber thoracic aorta. Normal size heart. No significant pericardial effusion/thickening. Mediastinum/Nodes: No suspicious thyroid nodule. No pathologically enlarged thoracic lymph nodes noting limited  evaluation of the hilar structures on noncontrast enhanced examination. The esophagus is grossly unremarkable. Lungs/Pleura: Stable 4 mm right lower lobe pulmonary nodule on image 83/7. Increased conspicuity of a 2 mm perifissural right lower lobe pulmonary nodule on image 70/7. No new suspicious pulmonary nodules or masses. Musculoskeletal: No aggressive lytic or blastic lesion of bone. CT ABDOMEN PELVIS FINDINGS Hepatobiliary: Bilobar hepatic cysts. No new suspicious hepatic lesion on noncontrast enhanced examination. Gallbladder is unremarkable. No biliary ductal dilation. Pancreas: No pancreatic ductal dilation or evidence of acute inflammation. Spleen: No splenomegaly. Adrenals/Urinary Tract: Bilateral adrenal glands appear normal. Right kidney surgically absent without new suspicious nodularity in the nephrectomy bed. Left interpolar ablation site is similar prior measuring 1.9 x 1.7 cm on image 68/3, unchanged. Urinary bladder is unremarkable for degree of distension. Stomach/Bowel: No radiopaque enteric contrast material was administered. Stomach is unremarkable for degree of distension. No pathologic dilation of small or large bowel. No evidence of acute bowel inflammation. Vascular/Lymphatic: Normal caliber abdominal aorta. Smooth IVC contours. Stable hepatoduodenal ligament lymph node measuring 1 cm on image 59/3. No progressive abdominopelvic adenopathy. Reproductive: Prostate is unremarkable.  Other: No significant abdominopelvic free fluid. Musculoskeletal: No aggressive lytic or blastic lesion of bone. IMPRESSION: 1. Stable examination without convincing noncontrast enhanced CT evidence of local recurrence or metastatic disease in the chest, abdomen or pelvis. 2. Increased conspicuity of a 2 mm right lower lobe pulmonary nodule likely an intrapulmonary lymph node. Suggest attention on follow-up imaging. 3. Stable 4 mm right lower lobe pulmonary nodule, favored benign. 4. Stable hepatoduodenal ligament lymph node. Electronically Signed   By: Maudry Mayhew M.D.   On: 09/30/2023 12:35   CT ABDOMEN PELVIS WO CONTRAST  Result Date: 09/30/2023 CLINICAL DATA:  History of metastatic renal cell carcinoma, follow-up. * Tracking Code: BO * EXAM: CT CHEST, ABDOMEN AND PELVIS WITHOUT CONTRAST TECHNIQUE: Multidetector CT imaging of the chest, abdomen and pelvis was performed following the standard protocol without IV contrast. RADIATION DOSE REDUCTION: This exam was performed according to the departmental dose-optimization program which includes automated exposure control, adjustment of the mA and/or kV according to patient size and/or use of iterative reconstruction technique. COMPARISON:  Multiple priors including most recent CT May 29, 2023 FINDINGS: CT CHEST FINDINGS Cardiovascular: Normal caliber thoracic aorta. Normal size heart. No significant pericardial effusion/thickening. Mediastinum/Nodes: No suspicious thyroid nodule. No pathologically enlarged thoracic lymph nodes noting limited evaluation of the hilar structures on noncontrast enhanced examination. The esophagus is grossly unremarkable. Lungs/Pleura: Stable 4 mm right lower lobe pulmonary nodule on image 83/7. Increased conspicuity of a 2 mm perifissural right lower lobe pulmonary nodule on image 70/7. No new suspicious pulmonary nodules or masses. Musculoskeletal: No aggressive lytic or blastic lesion of bone. CT ABDOMEN PELVIS FINDINGS  Hepatobiliary: Bilobar hepatic cysts. No new suspicious hepatic lesion on noncontrast enhanced examination. Gallbladder is unremarkable. No biliary ductal dilation. Pancreas: No pancreatic ductal dilation or evidence of acute inflammation. Spleen: No splenomegaly. Adrenals/Urinary Tract: Bilateral adrenal glands appear normal. Right kidney surgically absent without new suspicious nodularity in the nephrectomy bed. Left interpolar ablation site is similar prior measuring 1.9 x 1.7 cm on image 68/3, unchanged. Urinary bladder is unremarkable for degree of distension. Stomach/Bowel: No radiopaque enteric contrast material was administered. Stomach is unremarkable for degree of distension. No pathologic dilation of small or large bowel. No evidence of acute bowel inflammation. Vascular/Lymphatic: Normal caliber abdominal aorta. Smooth IVC contours. Stable hepatoduodenal ligament lymph node measuring 1 cm on image 59/3. No progressive abdominopelvic  adenopathy. Reproductive: Prostate is unremarkable. Other: No significant abdominopelvic free fluid. Musculoskeletal: No aggressive lytic or blastic lesion of bone. IMPRESSION: 1. Stable examination without convincing noncontrast enhanced CT evidence of local recurrence or metastatic disease in the chest, abdomen or pelvis. 2. Increased conspicuity of a 2 mm right lower lobe pulmonary nodule likely an intrapulmonary lymph node. Suggest attention on follow-up imaging. 3. Stable 4 mm right lower lobe pulmonary nodule, favored benign. 4. Stable hepatoduodenal ligament lymph node. Electronically Signed   By: Maudry Mayhew M.D.   On: 09/30/2023 12:35   CT HEAD WO CONTRAST ( )  Addendum Date: 09/30/2023   ADDENDUM REPORT: 09/30/2023 11:13 ADDENDUM: These results were called by telephone at the time of interpretation on 09/30/2023 at 11:00 am to provider Dr. Timothy Lasso, who verbally acknowledged these results. Electronically Signed   By: Jackey Loge D.O.   On: 09/30/2023 11:13    Result Date: 09/30/2023 CLINICAL DATA:  Provided history: Frequent headaches. Postural dizziness. Additional history provided: Intermittent pain at skull base/occipital region, history of metastatic renal cancer EXAM: CT HEAD WITHOUT CONTRAST TECHNIQUE: Contiguous axial images were obtained from the base of the skull through the vertex without intravenous contrast. RADIATION DOSE REDUCTION: This exam was performed according to the departmental dose-optimization program which includes automated exposure control, adjustment of the mA and/or kV according to patient size and/or use of iterative reconstruction technique. COMPARISON:  None. FINDINGS: Brain: 1.8 x 1.3 cm hyperdense mass within/along the posterosuperior aspect of the right cerebellar hemisphere (for instance as seen on series 6, image 59). Adjacent 10 mm cystic focus. Prominent surrounding edema within the right greater than left cerebellar hemispheres. Posterior fossa mass effect with partial effacement of the fourth ventricle and mass effect upon the brainstem. The right cerebellar tonsil is inferiorly displaced and extends caudally beyond the field of view. Mild prominence of the lateral and third ventricles suspicious for obstructive hydrocephalus. No demarcated cortical infarct. No extra-axial fluid collection. No supratentorial midline shift. Vascular: No hyperdense vessel. Skull: No calvarial fracture or aggressive osseous lesion. Sinuses/Orbits: No mass or acute finding within the imaged orbits. No significant paranasal sinus disease at the imaged levels. Other: Right mastoid effusion. Attempts are being made to reach the ordering provider at this time. IMPRESSION: 1. 1.8 x 1.3 cm mass within/along the posterosuperior aspect of the right cerebellar hemisphere. This is most suspicious for an intracranial metastasis given the patient's history. Alternatively, a meningioma is possible but not favored. Prominent surrounding edema within the right  greater than left cerebellar hemispheres. Posterior fossa mass effect with partial effacement of the fourth ventricle, mass effect upon the brainstem and inferior displacement of the right cerebellar tonsil. Mild prominence of the lateral and third ventricle suspicious for obstructive hydrocephalus. A brain MRI (with and without contrast) is recommended for further characterization and to better assess for any additional lesions. 2. Right mastoid effusion. Electronically Signed: By: Jackey Loge D.O. On: 09/30/2023 10:13      IMPRESSION/PLAN: 1. 57 y.o. man with solitary brain metastasis from stage IV clear-cell renal cell carcinoma  The patient would benefit from surgical resection of the brain metastasis.*  In addition, the patient would potentially benefit from radiotherapy.* The options include whole brain irradiation versus stereotactic radiosurgery. There are pros and cons associated with each of these potential treatment options. Whole brain radiotherapy would treat the known metastatic deposits and help provide some reduction of risk for future brain metastases. However, whole brain radiotherapy carries potential risks including hair loss, subacute  somnolence, and neurocognitive changes including a possible reduction in short-term memory. Whole brain radiotherapy also may carry a lower likelihood of tumor control at the treatment sites because of the low-dose used. Stereotactic radiosurgery carries a higher likelihood for local tumor control at the targeted sites with lower associated risk for neurocognitive changes such as memory loss.* However, the use of stereotactic radiosurgery in this setting may leave the patient at increased risk for new brain metastases elsewhere in the brain as high as 50-60%. Accordingly, patients who receive stereotactic radiosurgery in this setting should undergo ongoing surveillance imaging with brain MRI more frequently in order to identify and treat new small brain  metastases before they become symptomatic. Stereotactic radiosurgery does carry some different risks, including a risk of radionecrosis.  PLAN: Today, we reviewed the findings and workup thus far with the patient. We discussed the dilemma regarding whole brain radiotherapy versus stereotactic radiosurgery. We discussed the pros and cons of each. We also discussed the logistics and delivery of each. We reviewed the results associated with each of the treatments described above. The patient seems to understand the treatment options and would like to proceed with stereotactic radiosurgery.  In terms of timing of the stereotactic radiosurgery, evidence suggests that risk of radionecrosis and leptomeningeal recurrence is lower when used in the pre-operative setting as opposed to post-operative SRS.*  He and his wife appear to have a good understanding of his disease and our treatment recommendations which are of curative intent and they are comfortable and in agreement with the stated plan. He has freely signed written consent to proceed today in the office and a copy of this document will be placed in his medical record.  He we will proceed with [facility] CT Simulation following our visit today, in anticipation of proceeding with his single fraction preop SRS treatment on Friday, 10/10/2023, and Surgical resection on 10/13/23 under the care of of Dr. Conchita Paris.  We personally spent 70 minutes in this encounter including chart review, reviewing radiological studies, meeting face-to-face with the patient, entering orders and completing documentation.    Marguarite Arbour, PA-C    Margaretmary Dys, MD  [facility]  Radiation Oncology Direct Dial: [phone]  Fax: [phone] [email or web address]  Skype  [email or web address]   This document serves as a record of services personally performed by Margaretmary Dys, MD and Marcello Fennel, PA-C. It was created on their behalf by Mickie Bail, a trained medical scribe. The  creation of this record is based on the scribe's personal observations and the provider's statements to them. This document has been checked and approved by the attending provider.      *References:  1: Patchell RA, Tibbs PA, Ralene Bathe, [address] RJ, [place], [place]ta Lights JS, [place] B. A randomized trial of surgery in the treatment of single metastases to the brain. Malva Limes Med. 1990 Feb 22;322(8):494-500. PubMed PMID: 2956213.   2: Patchell RA, Tibbs PA, Regine WF, Meade Maw, Mohiuddin M, Marlow Baars, [place], [place], [place] B. Postoperative radiotherapy in the treatment of single metastases to the brain: a randomized trial. JAMA. 1998 Nov 4;280(17):1485-9. PubMed PMID: [id number].   3: Eliezer Lofts, Scherrie Gerlach, Hess KR, Maxine Glenn, Lang FF, Kornguth DG, [place], Swint JM, Shiu AS, Maor MH, [place]. Neurocognition in patients with brain metastases treated with radiosurgery or radiosurgery plus whole-brain irradiation: a randomised controlled trial. Lancet Oncol. 2009 Nov;10(11):1037-44. doi: 10.1016/S1470-2045(09)70263-3. Epub 2009 Oct 2. PubMed PMID: 69629528.  4: Bobbye Morton,  Burri SH, Asher AL, Crocker IR, [place], Zhang Lorenda Ishihara, [address], [place], Ru[place], Wait SD, Elpidio Eric, Shu HG, [place]. Comparing Preoperative With Postoperative Stereotactic Radiosurgery for Resectable Brain Metastases: A Multi-institutional Analysis. Neurosurgery. 2015 Nov 2. [Epub ahead of print] PubMed PMID: [id number].

## 2023-10-08 ENCOUNTER — Inpatient Hospital Stay: Payer: BC Managed Care – PPO | Attending: Oncology | Admitting: Internal Medicine

## 2023-10-08 ENCOUNTER — Other Ambulatory Visit: Payer: Self-pay | Admitting: Radiation Therapy

## 2023-10-08 VITALS — BP 132/89 | HR 62 | Temp 98.2°F | Resp 16 | Ht 66.0 in | Wt 255.0 lb

## 2023-10-08 DIAGNOSIS — Z905 Acquired absence of kidney: Secondary | ICD-10-CM | POA: Insufficient documentation

## 2023-10-08 DIAGNOSIS — Z79890 Hormone replacement therapy (postmenopausal): Secondary | ICD-10-CM | POA: Insufficient documentation

## 2023-10-08 DIAGNOSIS — C7931 Secondary malignant neoplasm of brain: Secondary | ICD-10-CM | POA: Diagnosis not present

## 2023-10-08 DIAGNOSIS — G911 Obstructive hydrocephalus: Secondary | ICD-10-CM | POA: Insufficient documentation

## 2023-10-08 DIAGNOSIS — C642 Malignant neoplasm of left kidney, except renal pelvis: Secondary | ICD-10-CM | POA: Diagnosis not present

## 2023-10-08 DIAGNOSIS — Z881 Allergy status to other antibiotic agents status: Secondary | ICD-10-CM | POA: Diagnosis not present

## 2023-10-08 DIAGNOSIS — K7689 Other specified diseases of liver: Secondary | ICD-10-CM | POA: Insufficient documentation

## 2023-10-08 DIAGNOSIS — C7800 Secondary malignant neoplasm of unspecified lung: Secondary | ICD-10-CM | POA: Diagnosis not present

## 2023-10-08 DIAGNOSIS — C649 Malignant neoplasm of unspecified kidney, except renal pelvis: Secondary | ICD-10-CM

## 2023-10-08 DIAGNOSIS — Z79899 Other long term (current) drug therapy: Secondary | ICD-10-CM | POA: Insufficient documentation

## 2023-10-08 DIAGNOSIS — Z880 Allergy status to penicillin: Secondary | ICD-10-CM | POA: Diagnosis not present

## 2023-10-08 DIAGNOSIS — Z9226 Personal history of immune checkpoint inhibitor therapy: Secondary | ICD-10-CM | POA: Insufficient documentation

## 2023-10-08 DIAGNOSIS — Z7952 Long term (current) use of systemic steroids: Secondary | ICD-10-CM | POA: Insufficient documentation

## 2023-10-08 DIAGNOSIS — K754 Autoimmune hepatitis: Secondary | ICD-10-CM | POA: Diagnosis not present

## 2023-10-08 DIAGNOSIS — G936 Cerebral edema: Secondary | ICD-10-CM | POA: Diagnosis not present

## 2023-10-08 DIAGNOSIS — R059 Cough, unspecified: Secondary | ICD-10-CM | POA: Diagnosis not present

## 2023-10-08 DIAGNOSIS — G935 Compression of brain: Secondary | ICD-10-CM | POA: Insufficient documentation

## 2023-10-08 NOTE — Progress Notes (Signed)
  Radiation Oncology         [phone] ________________________________  Name: Michael [name] MRN: [id number]  Date: 10/07/2023  DOB: 03-15-1966  SIMULATION AND TREATMENT PLANNING NOTE    ICD-10-CM   1. 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  Identity was confirmed.  All relevant records and images related to the planned course of therapy were reviewed.  The patient freely provided informed written consent to proceed with treatment after reviewing the details related to the planned course of therapy. The consent form was witnessed and verified by the simulation staff. Intravenous access was established for contrast administration. Then, the patient was set-up in a stable reproducible supine position for radiation therapy.  A relocatable thermoplastic stereotactic head frame was fabricated for precise immobilization.  CT images were obtained.  Surface markings were placed.  The CT images were loaded into the planning software and fused with the patient's targeting MRI scan.  Then the target and avoidance structures were contoured.  Treatment planning then occurred.  The radiation prescription was entered and confirmed.  I have requested 3D planning  I have requested a DVH of the following structures: Brain stem, brain, left eye, right eye, lenses, optic chiasm, target volumes, uninvolved brain, and normal tissue.    SPECIAL TREATMENT PROCEDURE:  The planned course of therapy using radiation constitutes a special treatment procedure. Special care is required in the management of this patient for the following reasons. This treatment constitutes a Special Treatment Procedure for the following reason: High dose per fraction requiring special monitoring for increased  toxicities of treatment including daily imaging.  The special nature of the planned course of radiotherapy will require increased physician supervision and oversight to ensure patient's safety with optimal treatment outcomes.  This requires extended time and effort.  PLAN:  The patient will receive 18 Gy in 1 fraction with pre-op intent.  ________________________________  Artist Pais. Kathrynn Running, M.D.

## 2023-10-08 NOTE — Progress Notes (Signed)
[facility] [phone]   [phone]  OFFICE PROGRESS NOTE  [name], MD [address] [place] [address]  DIAGNOSIS: Stage IV clear-cell renal cell carcinoma initially diagnosed as locally advanced disease in 2020 with evidence of pulmonary metastasis in 2021.  PRIOR THERAPY: 1) status post right nephrectomy on August 27, 2019 in [place] and the final pathology showed clear-cell renal cell carcinoma nuclear grade 3 with 10% necrosis and negative margin with the final pathologic stage of T2a, NX. 2) status post treatment with Keytruda and axitinib started March 2021 for 2 cycles interrupted because of autoimmune hepatitis.  3) axitinib 5 mg daily started July 05, 2020 discontinued after 1 week because of elevated LFTs. 4) status post cryoablation of left renal mass on May 30, 2021. 5) Keytruda 200 Mg IV every 3 weeks started July 27, 2021 and then switch it to 400 Mg every 6 weeks on November 09, 2021.  Last dose was given July 2024. 6) the patient had solitary brain metastasis in November 2024 and currently undergoing treatment with SRS and this will be followed by craniotomy and surgical resection on October 13, 2023.  CURRENT THERAPY: Observation.  INTERVAL HISTORY: Michael [name] 57 y.o. male returns to the clinic today for follow-up visit accompanied by his wife. Discussed the use of AI scribe software for clinical note transcription with the patient, who gave verbal consent to proceed.  History of Present Illness   The patient, a 57 year old with a history of kidney cancer, initially diagnosed in 2020, underwent a right nephrectomy followed by two months of treatment with Keytruda and axitinib. In 2021, lung nodules were identified and treated with [facility] for two years, completed in July. The patient also had a tumor on the left kidney, which was cryo-ablated in 2022.  The patient reported a tennis accident in the summer,  resulting in neck issues and mild headaches due to dizziness. Despite undergoing physical therapy, the headaches persisted. Approximately three weeks ago, the patient experienced imbalance upon standing, prompting a CT scan which revealed a brain metastasis. The patient also reported pressure at the back of the brain when coughing, though it was not debilitating.  The patient is currently on Decadron, which has alleviated the pain and other symptoms. The patient reported no other symptoms or health issues at the time of the consultation.      MEDICAL HISTORY: Past Medical History:  Diagnosis Date   Cancer [facility])    Renal cell carcinoma   Hypertension    Hypothyroidism    Sleep apnea    wears cpap    ALLERGIES:  is allergic to cleocin [clindamycin], penicillins, and tape.  MEDICATIONS:  Current Outpatient Medications  Medication Sig Dispense Refill   amLODipine (NORVASC) 5 MG tablet Take 7.5 mg by mouth daily before breakfast.     dexamethasone (DECADRON) 4 MG tablet Take 1 tablet (4 mg total) by mouth every 8 (eight) hours. 30 tablet 0   levothyroxine (SYNTHROID) 137 MCG tablet TAKE 1 TABLET(137 MCG) BY MOUTH DAILY BEFORE BREAKFAST 30 tablet 2   LORazepam (ATIVAN) 1 MG tablet Take 1 tablet (1 mg total) by mouth as needed for anxiety (take one tablet 30 minutes prior to MRI and may repeat once, just prior to scan, if needed). (Patient not taking: Reported on 10/08/2023) 10 tablet 0   methocarbamol (ROBAXIN) 500 MG tablet Take 500 mg by mouth every 8 (eight) hours as needed for muscle spasms.  montelukast (SINGULAIR) 10 MG tablet Take 10 mg by mouth daily before breakfast.     tadalafil (CIALIS) 20 MG tablet Take 10-20 mg by mouth daily as needed for erectile dysfunction.     No current facility-administered medications for this visit.    SURGICAL HISTORY:  Past Surgical History:  Procedure Laterality Date   IR RADIOLOGIST EVAL & MGMT  04/04/2021   no procedures performed   IR  RADIOLOGIST EVAL & MGMT  05/08/2021   no procedures performed   IR RADIOLOGIST EVAL & MGMT  08/12/2022   LUNG BIOPSY  2021   RADIOLOGY WITH ANESTHESIA N/A 05/30/2021   Procedure: CT WITH ANESTHESIA CRYOABLATION;  Surgeon: Irish Lack, MD;  Location: [facility];  Service: Radiology;  Laterality: N/A;   right nephrectomy Right 08/2019   TONSILLECTOMY      REVIEW OF SYSTEMS:  Constitutional: positive for fatigue Eyes: negative Ears, nose, mouth, throat, and face: negative Respiratory: negative Cardiovascular: negative Gastrointestinal: negative Genitourinary:negative Integument/breast: negative Hematologic/lymphatic: negative Musculoskeletal:negative Neurological: positive for headaches Behavioral/Psych: negative Endocrine: negative Allergic/Immunologic: negative   PHYSICAL EXAMINATION: General appearance: alert, cooperative, appears stated age, and no distress Head: Normocephalic, without obvious abnormality, atraumatic Neck: no adenopathy, no JVD, supple, symmetrical, trachea midline, and thyroid not enlarged, symmetric, no tenderness/mass/nodules Lymph nodes: Cervical, supraclavicular, and axillary nodes normal. Resp: clear to auscultation bilaterally Back: symmetric, no curvature. ROM normal. No CVA tenderness. Cardio: regular rate and rhythm, S1, S2 normal, no murmur, click, rub or gallop GI: soft, non-tender; bowel sounds normal; no masses,  no organomegaly Extremities: extremities normal, atraumatic, no cyanosis or edema Neurologic: Alert and oriented X 3, normal strength and tone. Normal symmetric reflexes. Normal coordination and gait  ECOG PERFORMANCE STATUS: 1 - Symptomatic but completely ambulatory  Blood pressure 132/89, pulse 62, temperature 98.2 F (36.8 C), temperature source Temporal, resp. rate 16, height 5\' 6"  (1.676 m), weight 255 lb (115.7 kg), SpO2 99%.  LABORATORY DATA: Lab Results  Component Value Date   WBC 6.0 09/30/2023   HGB 13.5 09/30/2023   HCT  42.0 09/30/2023   MCV 79.8 (L) 09/30/2023   PLT 350 09/30/2023      Chemistry      Component Value Date/Time   NA 140 09/30/2023 1255   K 3.9 09/30/2023 1255   CL 105 09/30/2023 1255   CO2 26 09/30/2023 1255   BUN 13 09/30/2023 1255   CREATININE 1.29 (H) 09/30/2023 1255   CREATININE 1.28 (H) 09/29/2023 0907      Component Value Date/Time   CALCIUM 9.3 09/30/2023 1255   ALKPHOS 102 09/30/2023 1255   AST 29 09/30/2023 1255   AST 22 09/29/2023 0907   ALT 25 09/30/2023 1255   ALT 20 09/29/2023 0907   BILITOT 0.6 09/30/2023 1255   BILITOT 0.5 09/29/2023 2956       RADIOGRAPHIC STUDIES: MR Brain W Wo Contrast  Result Date: 10/07/2023 CLINICAL DATA:  Metastatic disease evaluation 3T SRS and stereo protocol for surgical and treatment planning. (Pre-Op SRS) EXAM: MRI HEAD WITHOUT AND WITH CONTRAST TECHNIQUE: Multiplanar, multiecho pulse sequences of the brain and surrounding structures were obtained without and with intravenous contrast. CONTRAST:  10mL GADAVIST GADOBUTROL 1 MMOL/ML IV SOLN COMPARISON:  September 30, 2023. FINDINGS: Brain: Interval decrease in size of the enhancing right cerebellar lesion, now measuring approximately 2.1 x 2.1 cm (previously 2.7 x 2.5 cm when remeasured similarly). No new enhancing lesions identified. Mildly improved surrounding edema and mass effect with decreased effacement of the fourth ventricle.  Previously seen hydrocephalus has resolved. No evidence of acute infarct, acute hemorrhage, or extra-axial fluid collection. Vascular: Major arterial flow voids are maintained at the skull base. Skull and upper cervical spine: Normal marrow signal. Sinuses/Orbits: Clear sinuses.  No acute orbital findings. Other: Right mastoid effusions. IMPRESSION: 1. Interval decrease in size of the right cerebellar lesion, now measuring approximately 2.1. Mildly improved surrounding edema and mass effect with decreased effacement of the fourth ventricle. Previously seen  hydrocephalus as resolved. 2. No new lesions identified. Electronically Signed   By: Feliberto Harts M.D.   On: 10/07/2023 12:59   MR Brain W and Wo Contrast  Result Date: 09/30/2023 CLINICAL DATA:  Brain metastases.  History of renal cell carcinoma. EXAM: MRI HEAD WITHOUT AND WITH CONTRAST TECHNIQUE: Multiplanar, multiecho pulse sequences of the brain and surrounding structures were obtained without and with intravenous contrast. CONTRAST:  10mL GADAVIST GADOBUTROL 1 MMOL/ML IV SOLN COMPARISON:  Head CT 09/30/2023 FINDINGS: Brain: There is a 2.7 x 3.0 x 1.8 cm heterogeneously enhancing mass in the posterior right cerebellar hemisphere which appears partially necrotic/cystic with chronic blood products. There is extensive vasogenic edema throughout the right cerebellar hemisphere which mildly extends into the left cerebellar hemisphere. There is associated posterior fossa mass effect with effacement of the fourth ventricle, anterior displacement and mild compression of the midbrain and pons, and mild cerebellar tonsillar herniation with the tonsils extending 7 mm below the foramen magnum. The lateral and third ventricles are mildly dilated consistent with obstructive hydrocephalus, and there is mild periventricular T2 hyperintensity likely reflecting mild transependymal CSF flow. No acute infarct, supratentorial mass effect, or extra-axial fluid collection is evident. No second enhancing intracranial lesion is identified. Vascular: Major intracranial vascular flow voids are preserved. Skull and upper cervical spine: No suspicious marrow lesion. Sinuses/Orbits: Unremarkable orbits. Mild mucosal thickening in the ethmoid sinuses. Small right mastoid effusion. Other: None. IMPRESSION: 3 cm right cerebellar mass with extensive edema and associated mass effect as detailed above including mild obstructive hydrocephalus. This is most concerning for a solitary metastasis with a primary CNS neoplasm (such as  hemangioblastoma) also possible. Electronically Signed   By: Sebastian Ache M.D.   On: 09/30/2023 20:04   CT Chest Wo Contrast  Result Date: 09/30/2023 CLINICAL DATA:  History of metastatic renal cell carcinoma, follow-up. * Tracking Code: BO * EXAM: CT CHEST, ABDOMEN AND PELVIS WITHOUT CONTRAST TECHNIQUE: Multidetector CT imaging of the chest, abdomen and pelvis was performed following the standard protocol without IV contrast. RADIATION DOSE REDUCTION: This exam was performed according to the departmental dose-optimization program which includes automated exposure control, adjustment of the mA and/or kV according to patient size and/or use of iterative reconstruction technique. COMPARISON:  Multiple priors including most recent CT May 29, 2023 FINDINGS: CT CHEST FINDINGS Cardiovascular: Normal caliber thoracic aorta. Normal size heart. No significant pericardial effusion/thickening. Mediastinum/Nodes: No suspicious thyroid nodule. No pathologically enlarged thoracic lymph nodes noting limited evaluation of the hilar structures on noncontrast enhanced examination. The esophagus is grossly unremarkable. Lungs/Pleura: Stable 4 mm right lower lobe pulmonary nodule on image 83/7. Increased conspicuity of a 2 mm perifissural right lower lobe pulmonary nodule on image 70/7. No new suspicious pulmonary nodules or masses. Musculoskeletal: No aggressive lytic or blastic lesion of bone. CT ABDOMEN PELVIS FINDINGS Hepatobiliary: Bilobar hepatic cysts. No new suspicious hepatic lesion on noncontrast enhanced examination. Gallbladder is unremarkable. No biliary ductal dilation. Pancreas: No pancreatic ductal dilation or evidence of acute inflammation. Spleen: No splenomegaly. Adrenals/Urinary Tract: Bilateral  adrenal glands appear normal. Right kidney surgically absent without new suspicious nodularity in the nephrectomy bed. Left interpolar ablation site is similar prior measuring 1.9 x 1.7 cm on image 68/3, unchanged.  Urinary bladder is unremarkable for degree of distension. Stomach/Bowel: No radiopaque enteric contrast material was administered. Stomach is unremarkable for degree of distension. No pathologic dilation of small or large bowel. No evidence of acute bowel inflammation. Vascular/Lymphatic: Normal caliber abdominal aorta. Smooth IVC contours. Stable hepatoduodenal ligament lymph node measuring 1 cm on image 59/3. No progressive abdominopelvic adenopathy. Reproductive: Prostate is unremarkable. Other: No significant abdominopelvic free fluid. Musculoskeletal: No aggressive lytic or blastic lesion of bone. IMPRESSION: 1. Stable examination without convincing noncontrast enhanced CT evidence of local recurrence or metastatic disease in the chest, abdomen or pelvis. 2. Increased conspicuity of a 2 mm right lower lobe pulmonary nodule likely an intrapulmonary lymph node. Suggest attention on follow-up imaging. 3. Stable 4 mm right lower lobe pulmonary nodule, favored benign. 4. Stable hepatoduodenal ligament lymph node. Electronically Signed   By: Maudry Mayhew M.D.   On: 09/30/2023 12:35   CT ABDOMEN PELVIS WO CONTRAST  Result Date: 09/30/2023 CLINICAL DATA:  History of metastatic renal cell carcinoma, follow-up. * Tracking Code: BO * EXAM: CT CHEST, ABDOMEN AND PELVIS WITHOUT CONTRAST TECHNIQUE: Multidetector CT imaging of the chest, abdomen and pelvis was performed following the standard protocol without IV contrast. RADIATION DOSE REDUCTION: This exam was performed according to the departmental dose-optimization program which includes automated exposure control, adjustment of the mA and/or kV according to patient size and/or use of iterative reconstruction technique. COMPARISON:  Multiple priors including most recent CT May 29, 2023 FINDINGS: CT CHEST FINDINGS Cardiovascular: Normal caliber thoracic aorta. Normal size heart. No significant pericardial effusion/thickening. Mediastinum/Nodes: No suspicious thyroid  nodule. No pathologically enlarged thoracic lymph nodes noting limited evaluation of the hilar structures on noncontrast enhanced examination. The esophagus is grossly unremarkable. Lungs/Pleura: Stable 4 mm right lower lobe pulmonary nodule on image 83/7. Increased conspicuity of a 2 mm perifissural right lower lobe pulmonary nodule on image 70/7. No new suspicious pulmonary nodules or masses. Musculoskeletal: No aggressive lytic or blastic lesion of bone. CT ABDOMEN PELVIS FINDINGS Hepatobiliary: Bilobar hepatic cysts. No new suspicious hepatic lesion on noncontrast enhanced examination. Gallbladder is unremarkable. No biliary ductal dilation. Pancreas: No pancreatic ductal dilation or evidence of acute inflammation. Spleen: No splenomegaly. Adrenals/Urinary Tract: Bilateral adrenal glands appear normal. Right kidney surgically absent without new suspicious nodularity in the nephrectomy bed. Left interpolar ablation site is similar prior measuring 1.9 x 1.7 cm on image 68/3, unchanged. Urinary bladder is unremarkable for degree of distension. Stomach/Bowel: No radiopaque enteric contrast material was administered. Stomach is unremarkable for degree of distension. No pathologic dilation of small or large bowel. No evidence of acute bowel inflammation. Vascular/Lymphatic: Normal caliber abdominal aorta. Smooth IVC contours. Stable hepatoduodenal ligament lymph node measuring 1 cm on image 59/3. No progressive abdominopelvic adenopathy. Reproductive: Prostate is unremarkable. Other: No significant abdominopelvic free fluid. Musculoskeletal: No aggressive lytic or blastic lesion of bone. IMPRESSION: 1. Stable examination without convincing noncontrast enhanced CT evidence of local recurrence or metastatic disease in the chest, abdomen or pelvis. 2. Increased conspicuity of a 2 mm right lower lobe pulmonary nodule likely an intrapulmonary lymph node. Suggest attention on follow-up imaging. 3. Stable 4 mm right lower  lobe pulmonary nodule, favored benign. 4. Stable hepatoduodenal ligament lymph node. Electronically Signed   By: Maudry Mayhew M.D.   On: 09/30/2023 12:35  CT HEAD WO CONTRAST ( )  Addendum Date: 09/30/2023   ADDENDUM REPORT: 09/30/2023 11:13 ADDENDUM: These results were called by telephone at the time of interpretation on 09/30/2023 at 11:00 am to provider Dr. Timothy Lasso, who verbally acknowledged these results. Electronically Signed   By: Jackey Loge D.O.   On: 09/30/2023 11:13   Result Date: 09/30/2023 CLINICAL DATA:  Provided history: Frequent headaches. Postural dizziness. Additional history provided: Intermittent pain at skull base/occipital region, history of metastatic renal cancer EXAM: CT HEAD WITHOUT CONTRAST TECHNIQUE: Contiguous axial images were obtained from the base of the skull through the vertex without intravenous contrast. RADIATION DOSE REDUCTION: This exam was performed according to the departmental dose-optimization program which includes automated exposure control, adjustment of the mA and/or kV according to patient size and/or use of iterative reconstruction technique. COMPARISON:  None. FINDINGS: Brain: 1.8 x 1.3 cm hyperdense mass within/along the posterosuperior aspect of the right cerebellar hemisphere (for instance as seen on series 6, image 59). Adjacent 10 mm cystic focus. Prominent surrounding edema within the right greater than left cerebellar hemispheres. Posterior fossa mass effect with partial effacement of the fourth ventricle and mass effect upon the brainstem. The right cerebellar tonsil is inferiorly displaced and extends caudally beyond the field of view. Mild prominence of the lateral and third ventricles suspicious for obstructive hydrocephalus. No demarcated cortical infarct. No extra-axial fluid collection. No supratentorial midline shift. Vascular: No hyperdense vessel. Skull: No calvarial fracture or aggressive osseous lesion. Sinuses/Orbits: No mass or acute  finding within the imaged orbits. No significant paranasal sinus disease at the imaged levels. Other: Right mastoid effusion. Attempts are being made to reach the ordering provider at this time. IMPRESSION: 1. 1.8 x 1.3 cm mass within/along the posterosuperior aspect of the right cerebellar hemisphere. This is most suspicious for an intracranial metastasis given the patient's history. Alternatively, a meningioma is possible but not favored. Prominent surrounding edema within the right greater than left cerebellar hemispheres. Posterior fossa mass effect with partial effacement of the fourth ventricle, mass effect upon the brainstem and inferior displacement of the right cerebellar tonsil. Mild prominence of the lateral and third ventricle suspicious for obstructive hydrocephalus. A brain MRI (with and without contrast) is recommended for further characterization and to better assess for any additional lesions. 2. Right mastoid effusion. Electronically Signed: By: Jackey Loge D.O. On: 09/30/2023 10:13    ASSESSMENT AND PLAN: This is a very pleasant 57 years old white male with Stage IV clear-cell renal cell carcinoma initially diagnosed as locally advanced disease in 2020 with evidence of pulmonary metastasis in 2021.  He is status post right nephrectomy on August 27, 2019 in [place] and the final pathology showed clear-cell renal cell carcinoma nuclear grade 3 with 10% necrosis and negative margin with the final pathologic stage of T2a, NX. The patient is status post treatment with [facility] and axitinib started March 2021 for 2 cycles interrupted because of autoimmune hepatitis. He then  started Axitinib 5 mg daily on July 05, 2020 discontinued after 1 week because of elevated LFTs. He is also status post cryoablation of left renal mass on May 30, 2021. The patient is currently undergoing treatment with Keytruda 200 Mg IV every 3 weeks started July 27, 2021 and then switch it to 400 Mg every 6 weeks  on November 09, 2021.  He completed his treatment in July 2024. He was found to have solitary right cerebellar brain metastasis in November 2024.    Brain Metastasis Presents with brain  metastasis identified following a tennis accident leading to persistent headaches and imbalance. MRI confirmed brain swelling and a solitary tumor. Currently on Decadron, which has alleviated symptoms. Scheduled for [facility] on December 6 and surgical resection on December 9 to confirm the tumor's origin and remove it. Discussed a 10% chance of recurrence post-radiation and surgery. Explained that the blood-brain barrier is compromised, allowing some drugs to potentially reach the brain, but Keytruda alone is not recommended due to previous failure. Alternative treatments like cabozantinib or combination therapies may be considered if systemic treatment is needed. - Administer SRS on December 6 - Perform surgical resection on December 9 - Continue Decadron for symptom management - Monitor with regular MRI scans as ordered by Dr. Broadus John team  Renal Cell Carcinoma with Metastasis Renal cell carcinoma diagnosed in 2020 with right nephrectomy and subsequent lung metastasis treated with Keytruda and axitinib. Recent scans show no new concerning findings in the chest, abdomen, and pelvis, except for a 2mm lung nodule that requires monitoring. Discussed potential resumption of systemic treatment with combination therapies if multiple new metastases are found. - Repeat full CT scan (chest, abdomen, pelvis) in three months - Monitor 2mm lung nodule with follow-up scans  Thyroid Function TSH levels have decreased from 10 to 5.1, within acceptable limits. No need for medication adjustment at this time. - Monitor thyroid levels and adjust medication if significantly high or low  Follow-up - Schedule follow-up appointment in three months with full CT scan one week prior - Coordinate with Dr. Broadus John team for regular brain MRI  scans.   The patient was advised to call immediately if he has any other concerning symptoms in the interval. The patient voices understanding of current disease status and treatment options and is in agreement with the current care plan.  All questions were answered. The patient knows to call the clinic with any problems, questions or concerns. We can certainly see the patient much sooner if necessary.  The total time spent in the appointment was 30 minutes.  Disclaimer: This note was dictated with voice recognition software. Similar sounding words can inadvertently be transcribed and may not be corrected upon review.

## 2023-10-09 ENCOUNTER — Other Ambulatory Visit: Payer: Self-pay

## 2023-10-09 ENCOUNTER — Other Ambulatory Visit: Payer: Self-pay | Admitting: Neurosurgery

## 2023-10-09 NOTE — Progress Notes (Signed)
Surgical Instructions   Your procedure is scheduled on Monday, December 9th, 2024. Report to [facility] Main Entrance "A" at 11:30 A.M., then check in with the Admitting office. Any questions or running late day of surgery: call [phone]  Questions prior to your surgery date: call [phone], Monday-Friday, 8am-4pm. If you experience any cold or flu symptoms such as cough, fever, chills, shortness of breath, etc. between now and your scheduled surgery, please notify us at the above number.     Remember:  Do not eat after midnight the night before your surgery   You may drink clear liquids until 10:30 the morning of your surgery.   Clear liquids allowed are: Water, Non-Citrus Juices (without pulp), Carbonated Beverages, Clear Tea (no milk, honey, etc.), Black Coffee Only (NO MILK, CREAM OR POWDERED CREAMER of any kind), and Gatorade.    Take these medicines the morning of surgery with A SIP OF WATER: Amlodipine (Norvasc) Dexamethasone (Decadron) Levothyroxine (Synthroid) Montelukast (Singulair)   May take these medicines IF NEEDED: Methocarbamol (Robaxin)    One week prior to surgery, STOP taking any Aspirin (unless otherwise instructed by your surgeon) Aleve, Naproxen, Ibuprofen, Motrin, Advil, Goody's, BC's, all herbal medications, fish oil, and non-prescription vitamins.                     Do NOT Smoke (Tobacco/Vaping) for 24 hours prior to your procedure.  If you use a CPAP at night, you may bring your mask/headgear for your overnight stay.   You will be asked to remove any contacts, glasses, piercing's, hearing aid's, dentures/partials prior to surgery. Please bring cases for these items if needed.    Patients discharged the day of surgery will not be allowed to drive home, and someone needs to stay with them for 24 hours.  SURGICAL WAITING ROOM VISITATION Patients may have no more than 2 support people in the waiting area - these visitors may rotate.   Pre-op  nurse will coordinate an appropriate time for 1 ADULT support person, who may not rotate, to accompany patient in pre-op.  Children under the age of 56 must have an adult with them who is not the patient and must remain in the main waiting area with an adult.  If the patient needs to stay at the hospital during part of their recovery, the visitor guidelines for inpatient rooms apply.  Please refer to the [facility] website for the visitor guidelines for any additional information.   If you received a COVID test during your pre-op visit  it is requested that you wear a mask when out in public, stay away from anyone that may not be feeling well and notify your surgeon if you develop symptoms. If you have been in contact with anyone that has tested positive in the last 10 days please notify you surgeon.      Pre-operative CHG Bathing Instructions   You can play a key role in reducing the risk of infection after surgery. Your skin needs to be as free of germs as possible. You can reduce the number of germs on your skin by washing with CHG (chlorhexidine gluconate) soap before surgery. CHG is an antiseptic soap that kills germs and continues to kill germs even after washing.   DO NOT use if you have an allergy to chlorhexidine/CHG or antibacterial soaps. If your skin becomes reddened or irritated, stop using the CHG and notify one of our RNs at [phone].  TAKE A SHOWER THE NIGHT BEFORE SURGERY AND THE DAY OF SURGERY    Please keep in mind the following:  DO NOT shave, including legs and underarms, 48 hours prior to surgery.   You may shave your face before/day of surgery.  Place clean sheets on your bed the night before surgery Use a clean washcloth (not used since being washed) for each shower. DO NOT sleep with pet's night before surgery.  CHG Shower Instructions:  Wash your face and private area with normal soap. If you choose to wash your hair, wash first with your normal  shampoo.  After you use shampoo/soap, rinse your hair and body thoroughly to remove shampoo/soap residue.  Turn the water OFF and apply half the bottle of CHG soap to a CLEAN washcloth.  Apply CHG soap ONLY FROM YOUR NECK DOWN TO YOUR TOES (washing for 3-5 minutes)  DO NOT use CHG soap on face, private areas, open wounds, or sores.  Pay special attention to the area where your surgery is being performed.  If you are having back surgery, having someone wash your back for you may be helpful. Wait 2 minutes after CHG soap is applied, then you may rinse off the CHG soap.  Pat dry with a clean towel  Put on clean pajamas    Additional instructions for the day of surgery: DO NOT APPLY any lotions, deodorants, cologne, or perfumes.   Do not wear jewelry or makeup Do not wear nail polish, gel polish, artificial nails, or any other type of covering on natural nails (fingers and toes) Do not bring valuables to the hospital. [facility] is not responsible for valuables/personal belongings. Put on clean/comfortable clothes.  Please brush your teeth.  Ask your nurse before applying any prescription medications to the skin.

## 2023-10-10 ENCOUNTER — Ambulatory Visit: Payer: BC Managed Care – PPO

## 2023-10-10 ENCOUNTER — Other Ambulatory Visit: Payer: Self-pay

## 2023-10-10 ENCOUNTER — Ambulatory Visit
Admission: RE | Admit: 2023-10-10 | Discharge: 2023-10-10 | Disposition: A | Discharge status: Home / Self Care | Payer: BC Managed Care – PPO | Source: Ambulatory Visit | Attending: Radiation Oncology | Admitting: Radiation Oncology

## 2023-10-10 ENCOUNTER — Inpatient Hospital Stay (HOSPITAL_COMMUNITY)
Admission: RE | Admit: 2023-10-10 | Discharge: 2023-10-10 | Disposition: A | Discharge status: Home / Self Care | Payer: BC Managed Care – PPO | Source: Ambulatory Visit

## 2023-10-10 ENCOUNTER — Encounter (HOSPITAL_COMMUNITY): Payer: Self-pay | Admitting: Neurosurgery

## 2023-10-10 DIAGNOSIS — C649 Malignant neoplasm of unspecified kidney, except renal pelvis: Secondary | ICD-10-CM | POA: Diagnosis not present

## 2023-10-10 DIAGNOSIS — G4733 Obstructive sleep apnea (adult) (pediatric): Secondary | ICD-10-CM | POA: Diagnosis not present

## 2023-10-10 DIAGNOSIS — R600 Localized edema: Secondary | ICD-10-CM | POA: Diagnosis not present

## 2023-10-10 DIAGNOSIS — G936 Cerebral edema: Secondary | ICD-10-CM | POA: Diagnosis not present

## 2023-10-10 DIAGNOSIS — Z79899 Other long term (current) drug therapy: Secondary | ICD-10-CM | POA: Diagnosis not present

## 2023-10-10 DIAGNOSIS — Z85528 Personal history of other malignant neoplasm of kidney: Secondary | ICD-10-CM | POA: Diagnosis not present

## 2023-10-10 DIAGNOSIS — C7931 Secondary malignant neoplasm of brain: Secondary | ICD-10-CM | POA: Diagnosis not present

## 2023-10-10 DIAGNOSIS — E039 Hypothyroidism, unspecified: Secondary | ICD-10-CM | POA: Diagnosis not present

## 2023-10-10 DIAGNOSIS — G4730 Sleep apnea, unspecified: Secondary | ICD-10-CM | POA: Diagnosis not present

## 2023-10-10 DIAGNOSIS — Z510 Encounter for antineoplastic radiation therapy: Secondary | ICD-10-CM | POA: Diagnosis not present

## 2023-10-10 DIAGNOSIS — Z79890 Hormone replacement therapy (postmenopausal): Secondary | ICD-10-CM | POA: Diagnosis not present

## 2023-10-10 DIAGNOSIS — C801 Malignant (primary) neoplasm, unspecified: Secondary | ICD-10-CM | POA: Diagnosis not present

## 2023-10-10 DIAGNOSIS — Z905 Acquired absence of kidney: Secondary | ICD-10-CM | POA: Diagnosis not present

## 2023-10-10 DIAGNOSIS — I10 Essential (primary) hypertension: Secondary | ICD-10-CM | POA: Diagnosis not present

## 2023-10-10 DIAGNOSIS — Z91048 Other nonmedicinal substance allergy status: Secondary | ICD-10-CM | POA: Diagnosis not present

## 2023-10-10 DIAGNOSIS — Z880 Allergy status to penicillin: Secondary | ICD-10-CM | POA: Diagnosis not present

## 2023-10-10 DIAGNOSIS — Z881 Allergy status to other antibiotic agents status: Secondary | ICD-10-CM | POA: Diagnosis not present

## 2023-10-10 LAB — RAD ONC ARIA SESSION SUMMARY
Course Elapsed Days: 0
Plan Fractions Treated to Date: 1
Plan Prescribed Dose Per Fraction: 18 Gy
Plan Total Fractions Prescribed: 1
Plan Total Prescribed Dose: 18 Gy
Reference Point Dosage Given to Date: 18 Gy
Reference Point Session Dosage Given: 4.4281 Gy
Session Number: 1

## 2023-10-10 MED ORDER — LORAZEPAM 1 MG PO TABS
2.0000 mg | ORAL_TABLET | Freq: Once | ORAL | Status: AC
  Administered 2023-10-10: 2 mg via ORAL

## 2023-10-10 NOTE — Progress Notes (Signed)
Anesthesia Chart Review: Michael [name]  Case: [id number] Date/Time: 10/13/23 1324   Procedures:      SUBOCCIPITAL   CRANIOTOMY TUMOR EXCISION     APPLICATION OF CRANIAL NAVIGATION   Anesthesia type: General   Pre-op diagnosis: MATASTASIS TO BRAIN   [facility] ROOM 21 / [facility] OR   Surgeons: Lisbeth Renshaw, MD       DISCUSSION: Patient is a 57 year old male scheduled for the above procedure.  Known stage IV renal cell carcinoma. 09/2023 imaging concerning for new brain metastasis.  Started on Decadron.  Scheduled for SRS on 10/10/2023 and surgical resection on 10/13/2023 for removal and definitive tumor origin.   History includes never smoker, HTN, hypothyroidism, OSA, stage IV clear-cell renal cell carcinoma (initially diagnosed as locally advanced disease in 2020, s/p right nephrectomy 08/27/19, s/p Keytruda and axitinib x 2 cycles but interrupted due to autoimmune hepatitis; evidence of pulmonary metastasis in 2021; s/p cryoablation left renal mass 05/30/21; resumed Keytruda 07/2021-July 2024; solitary brain metastasis 09/2023).   Anesthesia team to evaluate on the day of surgery.    VS:  Wt Readings from Last 3 Encounters:  10/08/23 115.7 kg  10/07/23 115.4 kg  09/30/23 113.9 kg   BP Readings from Last 3 Encounters:  10/08/23 132/89  10/07/23 (!) 129/90  09/30/23 138/80   Pulse Readings from Last 3 Encounters:  10/08/23 62  10/07/23 65  09/30/23 87     PROVIDERS: [name], MD is PCP  Si Gaul, MD is HEM-ONC Margaretmary Dys, MD is RAD-ONC   LABS: Most recent lab results in [facility] include: Lab Results  Component Value Date   WBC 6.0 09/30/2023   HGB 13.5 09/30/2023   HCT 42.0 09/30/2023   PLT 350 09/30/2023   GLUCOSE 110 (H) 09/30/2023   ALT 25 09/30/2023   AST 29 09/30/2023   NA 140 09/30/2023   K 3.9 09/30/2023   CL 105 09/30/2023   CREATININE 1.29 (H) 09/30/2023   BUN 13 09/30/2023   CO2 26 09/30/2023   TSH 5.128 (H) 09/29/2023   INR 0.9  05/30/2021    IMAGES: MRI Brain 10/07/23: IMPRESSION: 1. Interval decrease in size of the right cerebellar lesion, now measuring approximately 2.1. Mildly improved surrounding edema and mass effect with decreased effacement of the fourth ventricle. Previously seen hydrocephalus as resolved. 2. No new lesions identified.   CT Chest/abd/pelvis 09/30/23: MPRESSION: 1. Stable examination without convincing noncontrast enhanced CT evidence of local recurrence or metastatic disease in the chest, abdomen or pelvis. 2. Increased conspicuity of a 2 mm right lower lobe pulmonary nodule likely an intrapulmonary lymph node. Suggest attention on follow-up imaging. 3. Stable 4 mm right lower lobe pulmonary nodule, favored benign. 4. Stable hepatoduodenal ligament lymph node.    EKG: 09/30/23: Sinus rhythm Low voltage, precordial leads, borderline T abnormalities, inferior leads.   CV: N/A  Past Medical History:  Diagnosis Date   Cancer (HCC)    Renal cell carcinoma   Clear cell renal cell carcinoma (HCC)    Hypertension    Hypothyroidism    Sleep apnea    wears cpap    Past Surgical History:  Procedure Laterality Date   IR RADIOLOGIST EVAL & MGMT  04/04/2021   no procedures performed   IR RADIOLOGIST EVAL & MGMT  05/08/2021   no procedures performed   IR RADIOLOGIST EVAL & MGMT  08/12/2022   LUNG BIOPSY  2021   RADIOLOGY WITH ANESTHESIA N/A 05/30/2021   Procedure: CT WITH ANESTHESIA  CRYOABLATION;  Surgeon: Irish Lack, MD;  Location: [facility] ORS;  Service: Radiology;  Laterality: N/A;   right nephrectomy Right 08/2019   TONSILLECTOMY      MEDICATIONS: No current facility-administered medications for this encounter.    amLODipine (NORVASC) 5 MG tablet   dexamethasone (DECADRON) 4 MG tablet   levothyroxine (SYNTHROID) 137 MCG tablet   methocarbamol (ROBAXIN) 500 MG tablet   montelukast (SINGULAIR) 10 MG tablet   tadalafil (CIALIS) 20 MG tablet   LORazepam (ATIVAN) 1 MG  tablet     Shonna Chock, PA-C Surgical Short Stay/Anesthesiology [facility] Phone [phone] [facility] Phone [phone] 10/10/2023 1:19 PM

## 2023-10-10 NOTE — Anesthesia Preprocedure Evaluation (Signed)
Anesthesia Evaluation  Patient identified by MRN, date of birth, ID band Patient awake    Reviewed: Allergy & Precautions, NPO status , Patient's Chart, lab work & pertinent test results, reviewed documented beta blocker date and time   History of Anesthesia Complications Negative for: history of anesthetic complications  Airway Mallampati: IV  TM Distance: >3 FB     Dental no notable dental hx.    Pulmonary sleep apnea , neg COPD, neg PE   breath sounds clear to auscultation       Cardiovascular hypertension, (-) CAD, (-) Past MI and (-) Cardiac Stents  Rhythm:Regular Rate:Normal     Neuro/Psych neg Seizures    GI/Hepatic   Endo/Other  Hypothyroidism    Renal/GU Renal disease     Musculoskeletal   Abdominal   Peds  Hematology   Anesthesia Other Findings   Reproductive/Obstetrics                             Anesthesia Physical Anesthesia Plan  ASA: 3  Anesthesia Plan: General   Post-op Pain Management:    Induction: Intravenous  PONV Risk Score and Plan: 2 and Dexamethasone, Ondansetron and TIVA  Airway Management Planned: Oral ETT and Video Laryngoscope Planned  Additional Equipment: Arterial line  Intra-op Plan:   Post-operative Plan: Extubation in OR and Possible Post-op intubation/ventilation  Informed Consent: I have reviewed the patients History and Physical, chart, labs and discussed the procedure including the risks, benefits and alternatives for the proposed anesthesia with the patient or authorized representative who has indicated his/her understanding and acceptance.     Dental advisory given  Plan Discussed with: CRNA  Anesthesia Plan Comments: (PAT note written 10/10/2023 by Shonna Chock, PA-C.  )       Anesthesia Quick Evaluation

## 2023-10-10 NOTE — Progress Notes (Signed)
Patient to nursing for 30 minutes observation SRS Brain.  Denies headache, fatigue, tinnitus, visual changes, and nausea.  Speech clear.  Gait independent usually patient did have 2 mg Ativan prior to treatment today and for safety reasons was assisted by nurse out of clinic via wheelchair.  Decadron 4 mg by mouth q 8 hours, no taper needed will be seen by his neurologist for taper instructions.  Patient was advised not to do anything strenuous for next 24 hours.  To call [phone] if have any questions or concerns.  Vitals:  97.7-61-18-127/87 O2 sat 98% on RA.

## 2023-10-10 NOTE — Progress Notes (Signed)
PCP - [name], MD Cardiologist - Denies  EKG - 10/01/2023 Chest x-ray - Denies ECHO - Denies Cardiac Cath - Denies  Sleep Study -Yes CPAP - Yes  DM- Denies   ERAS Protcol - NPO COVID TEST- N/A  Anesthesia review: Yes, OSA, HTN  -------------  SDW INSTRUCTIONS:  Your procedure is scheduled on Monday December 9th. Please report to [facility] Main Entrance "A" at 1130 A.M., and check in at the Admitting office. Call this number if you have problems the morning of surgery: [phone]   Remember: Do not eat or drink after midnight the night before your surgery    Medications to take morning of surgery with a sip of water include: Amlodipine Dexamethasone Levothyroxine Singulair IF NEEDED take Robaxin  As of today, STOP taking any Aspirin (unless otherwise instructed by your surgeon), Aleve, Naproxen, Ibuprofen, Motrin, Advil, Goody's, BC's, all herbal medications, fish oil, and all vitamins.    The Morning of Surgery Do not wear jewelry, make-up or nail polish. Do not wear lotions, powders, or perfumes/colognes, or deodorant Do not bring valuables to the hospital. [facility] is not responsible for any belongings or valuables.  If you are a smoker, DO NOT Smoke 24 hours prior to surgery  If you wear a CPAP at night please bring your mask the morning of surgery   Remember that you must have someone to transport you home after your surgery, and remain with you for 24 hours if you are discharged the same day.  Please bring cases for contacts, glasses, hearing aids, dentures or bridgework because it cannot be worn into surgery.   Patients discharged the day of surgery will not be allowed to drive home.   Please shower the NIGHT BEFORE/MORNING OF SURGERY (use antibacterial soap like DIAL soap if possible). Wear comfortable clothes the morning of surgery. Oral Hygiene is also important to reduce your risk of infection.  Remember - BRUSH YOUR TEETH THE MORNING OF  SURGERY WITH YOUR REGULAR TOOTHPASTE  Patient denies shortness of breath, fever, cough and chest pain.

## 2023-10-11 DIAGNOSIS — Z510 Encounter for antineoplastic radiation therapy: Secondary | ICD-10-CM | POA: Diagnosis not present

## 2023-10-11 DIAGNOSIS — C7931 Secondary malignant neoplasm of brain: Secondary | ICD-10-CM | POA: Diagnosis not present

## 2023-10-11 DIAGNOSIS — C649 Malignant neoplasm of unspecified kidney, except renal pelvis: Secondary | ICD-10-CM | POA: Diagnosis not present

## 2023-10-11 NOTE — Progress Notes (Signed)
  Radiation Oncology         [phone]) [phone] ________________________________  Stereotactic Treatment Procedure Note  Name: Michael [name] MRN: [id number]  Date: 10/10/2023  DOB: February 16, 1966  SPECIAL TREATMENT PROCEDURE    ICD-10-CM   1. Metastatic renal cell carcinoma to brain (HCC)  C79.31 LORazepam (ATIVAN) tablet 2 mg   C64.9       3D TREATMENT PLANNING AND DOSIMETRY:  The patient's radiation plan was reviewed and approved by neurosurgery and radiation oncology prior to treatment.  It showed 3-dimensional radiation distributions overlaid onto the planning CT/MRI image set.  The [facility] for the target structures as well as the organs at risk were reviewed. The documentation of the 3D plan and dosimetry are filed in the radiation oncology EMR.  NARRATIVE:  Michael [name] was brought to the TrueBeam stereotactic radiation treatment machine and placed supine on the CT couch. The head frame was applied, and the patient was set up for stereotactic radiosurgery.  Neurosurgery was present for the set-up and delivery  SIMULATION VERIFICATION:  In the couch zero-angle position, the patient underwent Exactrac imaging using the Brainlab system with orthogonal KV images.  These were carefully aligned and repeated to confirm treatment position for each of the isocenters.  The Exactrac snap film verification was repeated at each couch angle.  PROCEDURE: Thereasa Distance received stereotactic radiosurgery to the following targets: Right Cerebellar 23 mm target was treated using 4 Rapid Arc VMAT Beams to a prescription dose of 18 Gy.  ExacTrac registration was performed for each couch angle.  The 100% isodose line was prescribed.  6 MV X-rays were delivered in the flattening filter free beam mode.  STEREOTACTIC TREATMENT MANAGEMENT:  Following delivery, the patient was transported to nursing in stable condition and monitored for possible acute effects.  Vital signs were recorded. The patient tolerated treatment  without significant acute effects, and was discharged to home in stable condition.    PLAN: Patient tolerated pre-op SRS well and will have craniotomy/craniectomy on Monday 12/9 for resection of the cerebellar met and then follow-up in one month.  ________________________________  Artist Pais. Kathrynn Running, M.D.

## 2023-10-13 ENCOUNTER — Inpatient Hospital Stay (HOSPITAL_COMMUNITY)
Admission: RE | Admit: 2023-10-13 | Discharge: 2023-10-15 | DRG: 025 | Disposition: A | Discharge status: Home / Self Care | Payer: BC Managed Care – PPO | Attending: Neurosurgery | Admitting: Neurosurgery

## 2023-10-13 ENCOUNTER — Encounter (HOSPITAL_COMMUNITY)
Admission: RE | Disposition: A | Discharge status: Home / Self Care | Payer: Self-pay | Source: Home / Self Care | Attending: Neurosurgery

## 2023-10-13 ENCOUNTER — Inpatient Hospital Stay (HOSPITAL_COMMUNITY): Payer: BC Managed Care – PPO | Admitting: Vascular Surgery

## 2023-10-13 ENCOUNTER — Inpatient Hospital Stay: Payer: BC Managed Care – PPO

## 2023-10-13 ENCOUNTER — Encounter (HOSPITAL_COMMUNITY): Payer: Self-pay | Admitting: Neurosurgery

## 2023-10-13 ENCOUNTER — Other Ambulatory Visit: Payer: Self-pay

## 2023-10-13 DIAGNOSIS — G936 Cerebral edema: Secondary | ICD-10-CM | POA: Diagnosis present

## 2023-10-13 DIAGNOSIS — Z881 Allergy status to other antibiotic agents status: Secondary | ICD-10-CM

## 2023-10-13 DIAGNOSIS — Z880 Allergy status to penicillin: Secondary | ICD-10-CM

## 2023-10-13 DIAGNOSIS — Z79899 Other long term (current) drug therapy: Secondary | ICD-10-CM | POA: Diagnosis not present

## 2023-10-13 DIAGNOSIS — G4733 Obstructive sleep apnea (adult) (pediatric): Secondary | ICD-10-CM | POA: Diagnosis present

## 2023-10-13 DIAGNOSIS — Z85528 Personal history of other malignant neoplasm of kidney: Secondary | ICD-10-CM

## 2023-10-13 DIAGNOSIS — C7931 Secondary malignant neoplasm of brain: Principal | ICD-10-CM | POA: Diagnosis present

## 2023-10-13 DIAGNOSIS — Z91048 Other nonmedicinal substance allergy status: Secondary | ICD-10-CM | POA: Diagnosis not present

## 2023-10-13 DIAGNOSIS — Z79890 Hormone replacement therapy (postmenopausal): Secondary | ICD-10-CM | POA: Diagnosis not present

## 2023-10-13 DIAGNOSIS — I10 Essential (primary) hypertension: Secondary | ICD-10-CM | POA: Diagnosis present

## 2023-10-13 DIAGNOSIS — G4730 Sleep apnea, unspecified: Secondary | ICD-10-CM | POA: Diagnosis present

## 2023-10-13 DIAGNOSIS — Z905 Acquired absence of kidney: Secondary | ICD-10-CM

## 2023-10-13 DIAGNOSIS — E039 Hypothyroidism, unspecified: Secondary | ICD-10-CM | POA: Diagnosis present

## 2023-10-13 HISTORY — DX: Malignant neoplasm of unspecified kidney, except renal pelvis: C64.9

## 2023-10-13 HISTORY — PX: CRANIOTOMY: SHX93

## 2023-10-13 HISTORY — PX: APPLICATION OF CRANIAL NAVIGATION: SHX6578

## 2023-10-13 LAB — TYPE AND SCREEN
ABO/RH(D): B POS
Antibody Screen: NEGATIVE

## 2023-10-13 LAB — MRSA NEXT GEN BY PCR, NASAL: MRSA by PCR Next Gen: NOT DETECTED

## 2023-10-13 SURGERY — CRANIOTOMY TUMOR EXCISION
Anesthesia: General

## 2023-10-13 MED ORDER — BUPIVACAINE HCL (PF) 0.5 % IJ SOLN
INTRAMUSCULAR | Status: AC
  Filled 2023-10-13: qty 30

## 2023-10-13 MED ORDER — FENTANYL CITRATE (PF) 250 MCG/5ML IJ SOLN
INTRAMUSCULAR | Status: AC
  Filled 2023-10-13: qty 5

## 2023-10-13 MED ORDER — HYDROCODONE-ACETAMINOPHEN 5-325 MG PO TABS: 1.0000 | ORAL_TABLET | ORAL | Status: DC | PRN

## 2023-10-13 MED ORDER — BACITRACIN ZINC 500 UNIT/GM EX OINT
TOPICAL_OINTMENT | CUTANEOUS | Status: DC | PRN
  Administered 2023-10-13: 1 via TOPICAL

## 2023-10-13 MED ORDER — CHLORHEXIDINE GLUCONATE CLOTH 2 % EX PADS
6.0000 | MEDICATED_PAD | Freq: Every day | CUTANEOUS | Status: DC
  Administered 2023-10-13 – 2023-10-14 (×2): 6 via TOPICAL

## 2023-10-13 MED ORDER — ONDANSETRON HCL 4 MG PO TABS: 4.0000 mg | ORAL_TABLET | ORAL | Status: DC | PRN

## 2023-10-13 MED ORDER — DEXAMETHASONE SODIUM PHOSPHATE 10 MG/ML IJ SOLN
INTRAMUSCULAR | Status: DC | PRN
  Administered 2023-10-13: 10 mg via INTRAVENOUS

## 2023-10-13 MED ORDER — ESMOLOL HCL 100 MG/10ML IV SOLN
INTRAVENOUS | Status: DC | PRN
  Administered 2023-10-13: 20 mg via INTRAVENOUS
  Administered 2023-10-13 (×2): 30 mg via INTRAVENOUS

## 2023-10-13 MED ORDER — SODIUM CHLORIDE 0.9 % IV SOLN: INTRAVENOUS | Status: DC

## 2023-10-13 MED ORDER — VANCOMYCIN HCL 1.5 G IV SOLR
1500.0000 mg | INTRAVENOUS | Status: AC
  Administered 2023-10-13: 1500 mg via INTRAVENOUS
  Filled 2023-10-13: qty 1500

## 2023-10-13 MED ORDER — LIDOCAINE-EPINEPHRINE 1 %-1:100000 IJ SOLN
INTRAMUSCULAR | Status: AC
  Filled 2023-10-13: qty 1

## 2023-10-13 MED ORDER — BUPIVACAINE HCL (PF) 0.5 % IJ SOLN
INTRAMUSCULAR | Status: DC | PRN
  Administered 2023-10-13: 7 mL

## 2023-10-13 MED ORDER — HEMOSTATIC AGENTS (NO CHARGE) OPTIME
TOPICAL | Status: DC | PRN
  Administered 2023-10-13: 1 via TOPICAL

## 2023-10-13 MED ORDER — PROPOFOL 10 MG/ML IV BOLUS
INTRAVENOUS | Status: DC | PRN
  Administered 2023-10-13: 50 mg via INTRAVENOUS
  Administered 2023-10-13: 150 mg via INTRAVENOUS
  Administered 2023-10-13: 50 mg via INTRAVENOUS

## 2023-10-13 MED ORDER — METHOCARBAMOL 500 MG PO TABS: 500.0000 mg | ORAL_TABLET | Freq: Three times a day (TID) | ORAL | Status: DC | PRN

## 2023-10-13 MED ORDER — SODIUM CHLORIDE 0.9 % IV SOLN: INTRAVENOUS | Status: DC | PRN

## 2023-10-13 MED ORDER — ACETAMINOPHEN 325 MG PO TABS: 650.0000 mg | ORAL_TABLET | ORAL | Status: DC | PRN

## 2023-10-13 MED ORDER — CHLORHEXIDINE GLUCONATE 0.12 % MT SOLN: 15.0000 mL | Freq: Once | OROMUCOSAL | Status: AC

## 2023-10-13 MED ORDER — MONTELUKAST SODIUM 10 MG PO TABS
10.0000 mg | ORAL_TABLET | Freq: Every day | ORAL | Status: DC
  Administered 2023-10-14 – 2023-10-15 (×2): 10 mg via ORAL
  Filled 2023-10-13 (×2): qty 1

## 2023-10-13 MED ORDER — SUGAMMADEX SODIUM 200 MG/2ML IV SOLN
INTRAVENOUS | Status: DC | PRN
  Administered 2023-10-13: 200 mg via INTRAVENOUS

## 2023-10-13 MED ORDER — THROMBIN 5000 UNITS EX SOLR
CUTANEOUS | Status: AC
  Filled 2023-10-13: qty 5000

## 2023-10-13 MED ORDER — ESMOLOL HCL 100 MG/10ML IV SOLN
INTRAVENOUS | Status: AC
  Filled 2023-10-13: qty 10

## 2023-10-13 MED ORDER — ONDANSETRON HCL 4 MG/2ML IJ SOLN
4.0000 mg | INTRAMUSCULAR | Status: DC | PRN
  Filled 2023-10-13: qty 2

## 2023-10-13 MED ORDER — THROMBIN 5000 UNITS EX SOLR
OROMUCOSAL | Status: DC | PRN
  Administered 2023-10-13: 5 mL via TOPICAL

## 2023-10-13 MED ORDER — FENTANYL CITRATE (PF) 250 MCG/5ML IJ SOLN
INTRAMUSCULAR | Status: DC | PRN
  Administered 2023-10-13: 50 ug via INTRAVENOUS
  Administered 2023-10-13 (×2): 100 ug via INTRAVENOUS

## 2023-10-13 MED ORDER — LABETALOL HCL 5 MG/ML IV SOLN
INTRAVENOUS | Status: AC
  Administered 2023-10-13: 10 mg via INTRAVENOUS
  Filled 2023-10-13: qty 4

## 2023-10-13 MED ORDER — CHLORHEXIDINE GLUCONATE CLOTH 2 % EX PADS: 6.0000 | MEDICATED_PAD | Freq: Once | CUTANEOUS | Status: DC

## 2023-10-13 MED ORDER — MIDAZOLAM HCL 2 MG/2ML IJ SOLN
INTRAMUSCULAR | Status: AC
  Filled 2023-10-13: qty 2

## 2023-10-13 MED ORDER — LIDOCAINE 2% (20 MG/ML) 5 ML SYRINGE
INTRAMUSCULAR | Status: DC | PRN
  Administered 2023-10-13: 100 mg via INTRAVENOUS

## 2023-10-13 MED ORDER — ORAL CARE MOUTH RINSE: 15.0000 mL | OROMUCOSAL | Status: DC | PRN

## 2023-10-13 MED ORDER — MIDAZOLAM HCL 2 MG/2ML IJ SOLN
INTRAMUSCULAR | Status: DC | PRN
  Administered 2023-10-13: 2 mg via INTRAVENOUS

## 2023-10-13 MED ORDER — PROPOFOL 10 MG/ML IV BOLUS
INTRAVENOUS | Status: AC
  Filled 2023-10-13: qty 20

## 2023-10-13 MED ORDER — AMLODIPINE BESYLATE 5 MG PO TABS
7.5000 mg | ORAL_TABLET | Freq: Every day | ORAL | Status: DC
  Administered 2023-10-14 – 2023-10-15 (×2): 7.5 mg via ORAL
  Filled 2023-10-13 (×2): qty 1

## 2023-10-13 MED ORDER — ROCURONIUM BROMIDE 10 MG/ML (PF) SYRINGE
PREFILLED_SYRINGE | INTRAVENOUS | Status: DC | PRN
  Administered 2023-10-13: 80 mg via INTRAVENOUS
  Administered 2023-10-13: 30 mg via INTRAVENOUS
  Administered 2023-10-13: 20 mg via INTRAVENOUS

## 2023-10-13 MED ORDER — SENNOSIDES-DOCUSATE SODIUM 8.6-50 MG PO TABS: 1.0000 | ORAL_TABLET | Freq: Every evening | ORAL | Status: DC | PRN

## 2023-10-13 MED ORDER — LIDOCAINE 2% (20 MG/ML) 5 ML SYRINGE
INTRAMUSCULAR | Status: AC
  Filled 2023-10-13: qty 5

## 2023-10-13 MED ORDER — DEXAMETHASONE SODIUM PHOSPHATE 10 MG/ML IJ SOLN
INTRAMUSCULAR | Status: AC
  Filled 2023-10-13: qty 1

## 2023-10-13 MED ORDER — FLEET ENEMA RE ENEM: 1.0000 | ENEMA | Freq: Once | RECTAL | Status: DC | PRN

## 2023-10-13 MED ORDER — ACETAMINOPHEN 650 MG RE SUPP: 650.0000 mg | RECTAL | Status: DC | PRN

## 2023-10-13 MED ORDER — MORPHINE SULFATE (PF) 2 MG/ML IV SOLN: 1.0000 mg | INTRAVENOUS | Status: DC | PRN

## 2023-10-13 MED ORDER — THROMBIN 20000 UNITS EX SOLR
CUTANEOUS | Status: DC | PRN
  Administered 2023-10-13: 20 mL via TOPICAL

## 2023-10-13 MED ORDER — ONDANSETRON HCL 4 MG/2ML IJ SOLN
INTRAMUSCULAR | Status: DC | PRN
  Administered 2023-10-13: 4 mg via INTRAVENOUS

## 2023-10-13 MED ORDER — VANCOMYCIN HCL IN DEXTROSE 1-5 GM/200ML-% IV SOLN: 1000.0000 mg | INTRAVENOUS | Status: DC

## 2023-10-13 MED ORDER — MICROFIBRILLAR COLL HEMOSTAT EX PADS
MEDICATED_PAD | CUTANEOUS | Status: DC | PRN
  Administered 2023-10-13: 1 via TOPICAL

## 2023-10-13 MED ORDER — HYDROMORPHONE HCL 1 MG/ML IJ SOLN
0.2500 mg | INTRAMUSCULAR | Status: DC | PRN
  Administered 2023-10-13: 0.5 mg via INTRAVENOUS

## 2023-10-13 MED ORDER — DIAZEPAM 5 MG PO TABS
5.0000 mg | ORAL_TABLET | Freq: Once | ORAL | Status: AC
  Administered 2023-10-14: 5 mg via ORAL
  Filled 2023-10-13: qty 1

## 2023-10-13 MED ORDER — 0.9 % SODIUM CHLORIDE (POUR BTL) OPTIME
TOPICAL | Status: DC | PRN
  Administered 2023-10-13 (×2): 1000 mL

## 2023-10-13 MED ORDER — ORAL CARE MOUTH RINSE: 15.0000 mL | Freq: Once | OROMUCOSAL | Status: AC

## 2023-10-13 MED ORDER — ROCURONIUM BROMIDE 10 MG/ML (PF) SYRINGE
PREFILLED_SYRINGE | INTRAVENOUS | Status: AC
  Filled 2023-10-13: qty 10

## 2023-10-13 MED ORDER — CEFAZOLIN SODIUM-DEXTROSE 2-4 GM/100ML-% IV SOLN
2.0000 g | Freq: Three times a day (TID) | INTRAVENOUS | Status: AC
  Administered 2023-10-13 – 2023-10-14 (×2): 2 g via INTRAVENOUS
  Filled 2023-10-13 (×2): qty 100

## 2023-10-13 MED ORDER — PROPOFOL 500 MG/50ML IV EMUL
INTRAVENOUS | Status: DC | PRN
  Administered 2023-10-13: 50 ug/kg/min via INTRAVENOUS

## 2023-10-13 MED ORDER — BACITRACIN ZINC 500 UNIT/GM EX OINT
TOPICAL_OINTMENT | CUTANEOUS | Status: AC
  Filled 2023-10-13: qty 28.35

## 2023-10-13 MED ORDER — THROMBIN 20000 UNITS EX SOLR
CUTANEOUS | Status: AC
  Filled 2023-10-13: qty 20000

## 2023-10-13 MED ORDER — CHLORHEXIDINE GLUCONATE 0.12 % MT SOLN
OROMUCOSAL | Status: AC
  Administered 2023-10-13: 15 mL via OROMUCOSAL
  Filled 2023-10-13: qty 15

## 2023-10-13 MED ORDER — LEVOTHYROXINE SODIUM 25 MCG PO TABS
137.0000 ug | ORAL_TABLET | Freq: Every day | ORAL | Status: DC
  Administered 2023-10-14 – 2023-10-15 (×2): 137 ug via ORAL
  Filled 2023-10-13 (×2): qty 1

## 2023-10-13 MED ORDER — DEXAMETHASONE 4 MG PO TABS
4.0000 mg | ORAL_TABLET | Freq: Three times a day (TID) | ORAL | Status: DC
  Administered 2023-10-13 – 2023-10-15 (×5): 4 mg via ORAL
  Filled 2023-10-13 (×5): qty 1

## 2023-10-13 MED ORDER — LIDOCAINE-EPINEPHRINE 1 %-1:100000 IJ SOLN
INTRAMUSCULAR | Status: DC | PRN
  Administered 2023-10-13: 7 mL via INTRADERMAL

## 2023-10-13 MED ORDER — LABETALOL HCL 5 MG/ML IV SOLN
10.0000 mg | INTRAVENOUS | Status: DC | PRN
Start: 2023-10-13 — End: 2023-10-15

## 2023-10-13 MED ORDER — BISACODYL 10 MG RE SUPP: 10.0000 mg | Freq: Every day | RECTAL | Status: DC | PRN

## 2023-10-13 MED ORDER — ONDANSETRON HCL 4 MG/2ML IJ SOLN
INTRAMUSCULAR | Status: AC
  Filled 2023-10-13: qty 2

## 2023-10-13 MED ORDER — HYDROMORPHONE HCL 1 MG/ML IJ SOLN
INTRAMUSCULAR | Status: AC
  Filled 2023-10-13: qty 1

## 2023-10-13 MED ORDER — PANTOPRAZOLE SODIUM 40 MG IV SOLR
40.0000 mg | Freq: Every day | INTRAVENOUS | Status: DC
  Administered 2023-10-13: 40 mg via INTRAVENOUS
  Filled 2023-10-13: qty 10

## 2023-10-13 SURGICAL SUPPLY — 97 items
BAG COUNTER SPONGE SURGICOUNT (BAG) ×1 IMPLANT
BENZOIN TINCTURE PRP APPL 2/3 (GAUZE/BANDAGES/DRESSINGS) IMPLANT
BLADE CLIPPER SURG (BLADE) ×1 IMPLANT
BLADE SAW GIGLI 16 STRL (MISCELLANEOUS) IMPLANT
BLADE SURG 15 STRL LF DISP TIS (BLADE) IMPLANT
BLADE SURG SZ11 CARB STEEL (BLADE) IMPLANT
BLADE ULTRA TIP 2M (BLADE) ×1 IMPLANT
BNDG GAUZE DERMACEA FLUFF 4 (GAUZE/BANDAGES/DRESSINGS) IMPLANT
BNDG STRETCH 4X75 STRL LF (GAUZE/BANDAGES/DRESSINGS) IMPLANT
BUR ROUND FLUTED 5 RND (BURR) ×1 IMPLANT
BUR SPIRAL ROUTER 2.3 (BUR) ×1 IMPLANT
CANISTER SUCT 3000ML PPV (MISCELLANEOUS) ×2 IMPLANT
CASSETTE SUCT IRRIG SONOPET IQ (MISCELLANEOUS) IMPLANT
CATH VENTRIC 35X38 W/TROCAR LG (CATHETERS) IMPLANT
CLIP TI MEDIUM 6 (CLIP) IMPLANT
CNTNR URN SCR LID CUP LEK RST (MISCELLANEOUS) ×1 IMPLANT
COVER MAYO STAND STRL (DRAPES) IMPLANT
COVERAGE SUPPORT O-ARM STEALTH (MISCELLANEOUS) ×1 IMPLANT
DERMABOND ADVANCED .7 DNX12 (GAUZE/BANDAGES/DRESSINGS) IMPLANT
DRAIN SUBARACHNOID (WOUND CARE) IMPLANT
DRAPE HALF SHEET 40X57 (DRAPES) ×1 IMPLANT
DRAPE MICROSCOPE SLANT 54X150 (MISCELLANEOUS) IMPLANT
DRAPE NEUROLOGICAL W/INCISE (DRAPES) ×1 IMPLANT
DRAPE STERI IOBAN 125X83 (DRAPES) IMPLANT
DRAPE SURG 17X23 STRL (DRAPES) IMPLANT
DRAPE WARM FLUID 44X44 (DRAPES) ×1 IMPLANT
DRSG ADAPTIC 3X8 NADH LF (GAUZE/BANDAGES/DRESSINGS) IMPLANT
DRSG OPSITE POSTOP 4X6 (GAUZE/BANDAGES/DRESSINGS) IMPLANT
DRSG TELFA 3X8 NADH STRL (GAUZE/BANDAGES/DRESSINGS) IMPLANT
DURAPREP 6ML APPLICATOR 50/CS (WOUND CARE) ×1 IMPLANT
ELECT REM PT RETURN 9FT ADLT (ELECTROSURGICAL) ×1 IMPLANT
ELECTRODE REM PT RTRN 9FT ADLT (ELECTROSURGICAL) ×1 IMPLANT
EVACUATOR 1/8 PVC DRAIN (DRAIN) IMPLANT
EVACUATOR SILICONE 100CC (DRAIN) IMPLANT
FEE COVERAGE SUPPORT O-ARM (MISCELLANEOUS) ×1 IMPLANT
FORCEPS BIPOLAR SPETZLER 8 1.0 (NEUROSURGERY SUPPLIES) ×1 IMPLANT
GAUZE 4X4 16PLY ~~LOC~~+RFID DBL (SPONGE) IMPLANT
GAUZE SPONGE 4X4 12PLY STRL (GAUZE/BANDAGES/DRESSINGS) ×1 IMPLANT
GLOVE BIOGEL PI IND STRL 7.0 (GLOVE) IMPLANT
GLOVE BIOGEL PI IND STRL 7.5 (GLOVE) ×2 IMPLANT
GLOVE ECLIPSE 7.0 STRL STRAW (GLOVE) ×2 IMPLANT
GLOVE EXAM NITRILE XL STR (GLOVE) IMPLANT
GOWN STRL REUS W/ TWL LRG LVL3 (GOWN DISPOSABLE) ×2 IMPLANT
GOWN STRL REUS W/ TWL XL LVL3 (GOWN DISPOSABLE) IMPLANT
GOWN STRL REUS W/TWL 2XL LVL3 (GOWN DISPOSABLE) IMPLANT
GRAFT DURAGEN MATRIX 2WX2L IMPLANT
HEMOSTAT POWDER KIT SURGIFOAM (HEMOSTASIS) ×1 IMPLANT
HEMOSTAT SURGICEL 2X14 (HEMOSTASIS) ×1 IMPLANT
HOOK DURA 1/2IN (MISCELLANEOUS) ×1 IMPLANT
IV NS 1000ML BAXH (IV SOLUTION) ×1 IMPLANT
KIT BASIN OR (CUSTOM PROCEDURE TRAY) ×1 IMPLANT
KIT DRAIN CSF ACCUDRAIN (MISCELLANEOUS) IMPLANT
KIT TURNOVER KIT B (KITS) ×1 IMPLANT
KNIFE ARACHNOID DISP AM-23-SB (BLADE) IMPLANT
KNIFE ARACHNOID DISP AM-24-S (MISCELLANEOUS) ×1 IMPLANT
MARKER SPHERE PSV REFLC NDI (MISCELLANEOUS) ×3 IMPLANT
NDL HYPO 22X1.5 SAFETY MO (MISCELLANEOUS) ×1 IMPLANT
NDL SPNL 18GX3.5 QUINCKE PK (NEEDLE) IMPLANT
NEEDLE HYPO 22X1.5 SAFETY MO (MISCELLANEOUS) ×1 IMPLANT
NEEDLE SPNL 18GX3.5 QUINCKE PK (NEEDLE) IMPLANT
NS IRRIG 1000ML POUR BTL (IV SOLUTION) ×3 IMPLANT
PACK BATTERY CMF DISP FOR DVR (ORTHOPEDIC DISPOSABLE SUPPLIES) IMPLANT
PACK CRANIOTOMY CUSTOM (CUSTOM PROCEDURE TRAY) ×1 IMPLANT
PATTIES SURGICAL .25X.25 (GAUZE/BANDAGES/DRESSINGS) IMPLANT
PATTIES SURGICAL .5 X.5 (GAUZE/BANDAGES/DRESSINGS) IMPLANT
PATTIES SURGICAL .5 X1 (DISPOSABLE) IMPLANT
PATTIES SURGICAL .5 X3 (DISPOSABLE) IMPLANT
PATTIES SURGICAL 1/4 X 3 (GAUZE/BANDAGES/DRESSINGS) IMPLANT
PATTIES SURGICAL 1X1 (DISPOSABLE) IMPLANT
PIN MAYFIELD SKULL DISP (PIN) ×1 IMPLANT
PLATE BONE LG RETROSIG MALLEAB (Plate) IMPLANT
SCREW UNIII AXS SD 1.5X4 (Screw) IMPLANT
SEALANT ADHERUS EXTEND TIP (MISCELLANEOUS) IMPLANT
SLING ARM ELEVATOR 10X16 (CAST SUPPLIES) IMPLANT
SPECIMEN JAR SMALL (MISCELLANEOUS) IMPLANT
SPIKE FLUID TRANSFER (MISCELLANEOUS) ×1 IMPLANT
SPONGE NEURO XRAY DETECT 1X3 (DISPOSABLE) IMPLANT
SPONGE SURGIFOAM ABS GEL 100 (HEMOSTASIS) ×1 IMPLANT
STAPLER SKIN PROX WIDE 3.9 (STAPLE) IMPLANT
STAPLER VISISTAT 35W (STAPLE) ×1 IMPLANT
STOCKINETTE 6 STRL (DRAPES) IMPLANT
SUT ETHILON 3 0 FSL (SUTURE) IMPLANT
SUT ETHILON 3 0 PS 1 (SUTURE) IMPLANT
SUT NURALON 4 0 TR CR/8 (SUTURE) ×3 IMPLANT
SUT SILK 0 TIES 10X30 (SUTURE) IMPLANT
SUT VIC AB 0 CT1 18XCR BRD8 (SUTURE) ×2 IMPLANT
SUT VIC AB 3-0 SH 8-18 (SUTURE) ×2 IMPLANT
TAPE CLOTH 1X10 TAN NS (GAUZE/BANDAGES/DRESSINGS) ×1 IMPLANT
TIP SONOPET IQ 12 BARRACUDA (TIP) IMPLANT
TOWEL GREEN STERILE (TOWEL DISPOSABLE) ×1 IMPLANT
TOWEL GREEN STERILE FF (TOWEL DISPOSABLE) ×1 IMPLANT
TRAY FOLEY MTR SLVR 16FR STAT (SET/KITS/TRAYS/PACK) ×1 IMPLANT
TUBE CONNECTING 12X1/4 (SUCTIONS) ×1 IMPLANT
TUBE CONNECTING 20X1/4 (TUBING) IMPLANT
TUBING FEATHERFLOW (TUBING) IMPLANT
UNDERPAD 30X36 HEAVY ABSORB (UNDERPADS AND DIAPERS) ×1 IMPLANT
WATER STERILE IRR 1000ML POUR (IV SOLUTION) ×1 IMPLANT

## 2023-10-13 NOTE — Anesthesia Postprocedure Evaluation (Signed)
Anesthesia Post Note  Patient: Michael [name]  Procedure(s) Performed: SUBOCCIPITAL   CRANIOTOMY TUMOR EXCISION APPLICATION OF CRANIAL NAVIGATION     Patient location during evaluation: PACU Anesthesia Type: General Level of consciousness: awake and alert Pain management: pain level controlled Vital Signs Assessment: post-procedure vital signs reviewed and stable Respiratory status: spontaneous breathing, nonlabored ventilation, respiratory function stable and patient connected to nasal cannula oxygen Cardiovascular status: blood pressure returned to baseline and stable Postop Assessment: no apparent nausea or vomiting Anesthetic complications: no  No notable events documented.  Last Vitals:  Vitals:   10/13/23 1945 10/13/23 2100  BP: (!) 146/92 (!) 137/90  Pulse: (!) 56 64  Resp: 10 12  Temp: 36.7 C   SpO2: 100% 99%    Last Pain:  Vitals:   10/13/23 1945  TempSrc:   PainSc: 3                  Yoseph Haile,W. EDMOND

## 2023-10-13 NOTE — Anesthesia Procedure Notes (Signed)
Arterial Line Insertion Start/End12/07/2023 12:50 PM, 10/13/2023 1:00 PM Performed by: Cy Blamer, CRNA, CRNA  Patient location: Pre-op. Preanesthetic checklist: patient identified, IV checked, site marked, risks and benefits discussed, surgical consent, monitors and equipment checked, pre-op evaluation, timeout performed and anesthesia consent Lidocaine 1% used for infiltration Left, radial was placed Catheter size: 20 G Hand hygiene performed  and maximum sterile barriers used  Allen's test indicative of satisfactory collateral circulation Attempts: 1 Procedure performed without using ultrasound guided technique. Following insertion, dressing applied and Biopatch. Post procedure assessment: normal and unchanged  Patient tolerated the procedure well with no immediate complications.

## 2023-10-13 NOTE — H&P (Signed)
Chief Complaint   Metastatic Brain Tumor  History of Present Illness  [name] is a 57 year old man I am seeing after recent visit to the emergency department. Briefly, the patient has a history of clear cell renal carcinoma diagnosed about four years ago at which time he underwent a right nephrectomy. Subsequent to that patient underwent routine imaging which at that time did not reveal any brain or systemic metastases. Patient subsequently was found to have a small hilar metastasis for which she was treated with immunotherapy. After that, he was found to have a small left renal metastasis which was treated with cryo therapy. He was maintained on maintenance immunotherapy until this past July at which point he had no evidence of further disease. He was taken off the immunotherapy. He suffered a accident while playing tennis in which he ran into a fence chasing down a lot. He was having some headache and neck pain after this which improved with physical therapy. Unfortunately several weeks ago he noted imbalance and some dizziness especially when standing up from a seated position. With his history of renal cell, his primary doctor did order a CT scan of the head in addition to schedule systemic imaging. This revealed a right-sided cerebellar lesion for which she was sent to the emergency department where MRI confirmed the presence of a homogeneously enhancing right cerebellar lesion with associated edema. At my request, patient was started on a dose of dexamethasone. Since that all his symptoms of headache and imbalance have resolved.  Patient has since been seen in the outpatient neurosurgery clinic and his case has been discussed with our radiation oncology colleagues.  The patient has undergone preoperative stereotactic radiosurgery and presents today for surgical resection.  Of note, the patient reports a history of mild medically controlled hypertension. No history of diabetes, previous heart  attack or stroke. No known lung or liver disease. He is not on any blood thinners or antiplatelet agents. He is a nonsmoker.   Past Medical History   Past Medical History:  Diagnosis Date   Cancer [facility])    Renal cell carcinoma   Clear cell renal cell carcinoma (HCC)    Hypertension    Hypothyroidism    Sleep apnea    wears cpap    Past Surgical History   Past Surgical History:  Procedure Laterality Date   IR RADIOLOGIST EVAL & MGMT  04/04/2021   no procedures performed   IR RADIOLOGIST EVAL & MGMT  05/08/2021   no procedures performed   IR RADIOLOGIST EVAL & MGMT  08/12/2022   LUNG BIOPSY  2021   RADIOLOGY WITH ANESTHESIA N/A 05/30/2021   Procedure: CT WITH ANESTHESIA CRYOABLATION;  Surgeon: Irish Lack, MD;  Location: [facility] ORS;  Service: Radiology;  Laterality: N/A;   right nephrectomy Right 08/2019   TONSILLECTOMY      Social History   Social History   Tobacco Use   Smoking status: Never   Smokeless tobacco: Never  Vaping Use   Vaping status: Never Used  Substance Use Topics   Alcohol use: Not Currently    Comment: occasional beer or wine 1 x a month   Drug use: Never    Medications   Prior to Admission medications   Medication Sig Start Date End Date Taking? Authorizing Provider  amLODipine (NORVASC) 5 MG tablet Take 7.5 mg by mouth daily before breakfast. 04/23/21  Yes [provider]  dexamethasone (DECADRON) 4 MG tablet Take 1 tablet (4 mg total) by mouth every  8 (eight) hours. 09/30/23  Yes Cathren Laine, MD  levothyroxine (SYNTHROID) 137 MCG tablet TAKE 1 TABLET(137 MCG) BY MOUTH DAILY BEFORE BREAKFAST 08/04/23  Yes Heilingoetter, Cassandra L, PA-C  montelukast (SINGULAIR) 10 MG tablet Take 10 mg by mouth daily before breakfast.   Yes [provider]  tadalafil (CIALIS) 20 MG tablet Take 10-20 mg by mouth daily as needed for erectile dysfunction.   Yes [provider]  LORazepam (ATIVAN) 1 MG tablet Take 1 tablet (1 mg total) by  mouth as needed for anxiety (take one tablet 30 minutes prior to MRI and may repeat once, just prior to scan, if needed). Patient not taking: Reported on 10/08/2023 10/07/23   Bruning, Ashlyn, PA-C  methocarbamol (ROBAXIN) 500 MG tablet Take 500 mg by mouth every 8 (eight) hours as needed for muscle spasms.    [provider]    Allergies   Allergies  Allergen Reactions   Cleocin [Clindamycin] Rash   Penicillins Other (See Comments)    Unknown reaction in childhood   Tape Rash    Review of Systems  ROS  Neurologic Exam  Awake, alert, oriented Memory and concentration grossly intact Speech fluent, appropriate CN grossly intact Motor exam: Upper Extremities Deltoid Bicep Tricep Grip  Right 5/5 5/5 5/5 5/5  Left 5/5 5/5 5/5 5/5   Lower Extremities IP Quad PF DF EHL  Right 5/5 5/5 5/5 5/5 5/5  Left 5/5 5/5 5/5 5/5 5/5   Sensation grossly intact to LT  Imaging  There is an approximately 2 to 3 cm right superficial cerebellar hemispheric peripherally enhancing tumor with associated surrounding vasogenic edema.  Tumor appears to abut the inferior aspect of the transverse sinus and tentorium.  Impression  - 57 y.o. male with renal cell carcinoma and metastatic right cerebellar tumor having undergone preoperative stereotactic radiosurgery  Plan  - We will plan on proceeding with stereotactic right retrosigmoid craniectomy for resection of tumor  I have reviewed the indications for the procedure as well as the details of the procedure and the expected postoperative course and recovery at length with the patient and his wife in the office. We have also reviewed in detail the risks, benefits, and alternatives to the procedure. All questions were answered and Elio Gillenwater provided informed consent to proceed.  Lisbeth Renshaw, MD [facility]

## 2023-10-13 NOTE — Radiation Completion Notes (Signed)
Patient Name: Michael [name], Michael [name] MRN: [id number] Date of Birth: 1966/07/08 Referring Physician: [name], M.D. Date of Service: 2023-10-13 Radiation Oncologist: Margaretmary Bayley, M.D. [facility]                             RADIATION ONCOLOGY END OF TREATMENT NOTE     Diagnosis: C79.31 Secondary malignant neoplasm of brain Staging on 2022-01-30: Kidney cancer, primary, with metastasis from kidney to other site [facility]) T=cTX, N=cNX, M=cM1 Intent: Palliative     ==========DELIVERED PLANS==========  First Treatment Date: 2023-10-10 Last Treatment Date: 2023-10-10   Plan Name: Brain_SRS Site: Brain Technique: SBRT/SRT-IMRT Mode: Photon Dose Per Fraction: 18 Gy Prescribed Dose (Delivered / Prescribed): 18 Gy / 18 Gy Prescribed Fxs (Delivered / Prescribed): 1 / 1     ==========ON TREATMENT VISIT DATES========== 2023-10-10     ==========UPCOMING VISITS========== 01/07/2024 [facility] ONCOLOGY EST PT [address] He[address], Cassandra L, PA-C  12/29/2023 [facility] ON[facility]Y LAB [facility]ON[facility] LAB  11/18/2023 [facility]RAD[facility] ONC POST TREATMENT CALL [facility]POST TREATMENT  10/20/2023 [facility] ONCOL[facility]Y TUMOR BOARD [facility]TUMOR BOARD CONFERENCE        ==========APPENDIX - ON TREATMENT VISIT NOTES==========   See weekly On Treatment Notes in Epic for details in the Media tab (listed as Progress notes on the On Treatment Visit Dates listed above).

## 2023-10-13 NOTE — Anesthesia Procedure Notes (Signed)
Procedure Name: Intubation Date/Time: 10/13/2023 3:04 PM  Performed by: Georgianne Fick D, CRNAPre-anesthesia Checklist: Patient identified, Emergency Drugs available, Suction available and Patient being monitored Patient Re-evaluated:Patient Re-evaluated prior to induction Oxygen Delivery Method: Circle System Utilized Preoxygenation: Pre-oxygenation with 100% oxygen Induction Type: IV induction Ventilation: Two handed mask ventilation required and Oral airway inserted - appropriate to patient size Laryngoscope Size: Glidescope and 3 Grade View: Grade I Tube type: Oral Tube size: 7.5 mm Number of attempts: 1 Airway Equipment and Method: Oral airway, Video-laryngoscopy and Rigid stylet Placement Confirmation: ETT inserted through vocal cords under direct vision, positive ETCO2 and breath sounds checked- equal and bilateral Secured at: 23 cm Tube secured with: Tape Dental Injury: Teeth and Oropharynx as per pre-operative assessment  Comments: Performed by trayce wilk

## 2023-10-13 NOTE — Op Note (Signed)
NEUROSURGERY OPERATIVE NOTE   PREOP DIAGNOSIS:  Metastatic brain tumor   POSTOP DIAGNOSIS: Same  PROCEDURE: Stereotactic right retrosigmoid craniectomy for resection of tumor Use of intraoperative microscope for microdissection  SURGEON: Dr. Lisbeth Renshaw, MD  ASSISTANT: None  ANESTHESIA: General Endotracheal  EBL: 100cc  SPECIMENS: Right cerebellar tumor for permanent pathology  DRAINS: None  COMPLICATIONS: None immediate  CONDITION: Hemodynamically stable to PACU  HISTORY: Michael [name] is a 57 y.o. male with a history of clear-cell renal carcinoma diagnosed several years back.  He presented to the emergency department recently with headache and dizziness and CT scan and subsequent MRI revealing a homogeneously enhancing lesion within the right cerebellum concerning for metastatic disease.  The patient's case was discussed at the tumor conference and decision was to proceed with treatment.  The patient has undergone preoperative stereotactic radiosurgery and presents for surgical resection of the lesion.  The risks, benefits, and alternatives to surgery were all reviewed in detail with the patient and his wife.  After all questions were answered informed consent was obtained and witnessed.  PROCEDURE IN DETAIL: The patient was brought to the operating room. After induction of general anesthesia, the patient was positioned on the operative table in the lateral position in the [place] headholder. All pressure points were meticulously padded.  Surface markers were then Co. registered with the preoperative stereotactic MRI scan until an excellent accuracy was achieved.  This was then used to mark out the surface projection of the right transverse and sigmoid sinuses as well as the underlying tumor.  Curvilinear skin incision was then marked out directly overlying the tumor extending superiorly above the transverse sinus and inferiorly to the level of the horizontal portion of the  occipital bone. The region was prepped and draped in the usual sterile fashion.  After timeout was conducted, a sigmoid shaped skin incision was infiltrated with local anesthetic with epinephrine.  Incision was then made sharply and carried down through subcutaneous tissue.  The occipital artery was identified and as I opened the skin flap, care was taken to preserve this vessel within the subcutaneous tissue.  The occipital bone was identified and subperiosteal dissection was carried out.  As I worked inferiorly, I was able to identify the digastric muscle anteriorly, as well as a portion of the sternocleidomastoid muscle.  These were divided partially, once subperiosteal dissection was carried out, self-retaining retractors were placed.  Stereotactic system was then used to identify the underlying transverse sinus, and plan out a craniotomy which allowed access to the anterior, posterior, and inferior margins of the tumor.  At this point the high-speed drill was used to create a trough at the inferior margin of the transverse sinus.  The dura was then identified and Kerrison punches were used to complete the bone cut at the level of the transverse sinus.  The high-speed drill with router was then used to complete the craniectomy inferiorly.  The bone was then removed.  I then used a high-speed drill to thin out the bone anteriorly, posteriorly, and inferiorly to widen the craniectomy defect.  This was done with a combination of Kerrison punches and rongeur's.  Stereotactic system was then again used to make sure I had good access to the tumor circumferentially.  At this point the dura was then opened in curvilinear fashion based superiorly and tacked up with 4 Nurolon stitches.  The microscope was then draped sterilely and brought into the field and the remainder of this tumor resection was done under  the microscope using microdissection technique.  Initial corticectomy was made around the surface of the  tumor using the bipolar and microscissors.  I was then able to easily identify the tumor arising from the tentorial surface which appeared to be slightly more firm than the surrounding cerebellar white matter.  Tumor was then easily dissected away from the surrounding cerebellar white matter using a combination of bipolar electrocautery and the ultrasonic aspirator.  In this fashion, the posterior, inferior, and anterior margins of the tumor were easily dissected.  I then turned attention to the attachment of the tumor to the tentorial surface.  Using the bipolar electrocautery, I did carefully detach the tumor from the transverse sinus and the tentorium.  Once this was done, I was able to complete the more medial dissection of the tumor which was ultimately removed en bloc and sent for permanent pathology.  Once the tumor resection was completed, I was able to visualize the tentorial surface.  I did use the bipolar to coagulate the attachment of the tumor to the tentorium and the transverse sinus.  No further tumor was noted in the resection cavity.  Hemostasis was then easily secured primarily with morselized Gelfoam and thrombin.  At this point I placed a collagen inlay graft and the dura was reapproximated with interrupted 4-0 Nurolon stitches.  Due to contraction of the dura, I was not able to achieve a good watertight closure.  The closure was therefore covered with a layer of polyethylene glycol sealant.  I then placed a titanium mesh over the craniectomy defect and secured it with titanium screws.  Self-retaining retractors were then removed.  The wound was irrigated with normal saline.  The wound was then closed in multiple layers using a combination of interrupted 0 and 3-0 Vicryl stitches.  Skin was closed with interrupted 3-0 Vicryl subcuticular stitches and a layer of Dermabond.  Once this dried a sterile dressing was placed.  The patient was then removed from the Mayfield head holder.  At the end  of the case all sponge, needle, and instrument counts were correct. The patient was then transferred to the stretcher, extubated, and taken to the post-anesthesia care unit in stable hemodynamic condition.   Lisbeth Renshaw, MD [facility]

## 2023-10-13 NOTE — Transfer of Care (Signed)
Immediate Anesthesia Transfer of Care Note  Patient: Michael [name]  Procedure(s) Performed: SUBOCCIPITAL   CRANIOTOMY TUMOR EXCISION APPLICATION OF CRANIAL NAVIGATION  Patient Location: PACU  Anesthesia Type:General  Level of Consciousness: awake, alert , and oriented  Airway & Oxygen Therapy: Patient Spontanous Breathing and Patient connected to nasal cannula oxygen  Post-op Assessment: Report given to RN and Post -op Vital signs reviewed and stable  Post vital signs: Reviewed and stable  Last Vitals:  Vitals Value Taken Time  BP 120/79 10/13/23 1833  Temp    Pulse 59 10/13/23 1837  Resp 14 10/13/23 1837  SpO2 99 % 10/13/23 1837  Vitals shown include unfiled device data.  Last Pain:  Vitals:   10/13/23 1242  TempSrc:   PainSc: 0-No pain         Complications: No notable events documented.

## 2023-10-13 NOTE — Progress Notes (Signed)
eLink Physician-Brief Progress Note Patient Name: Michael [name] DOB: 1966/02/26 MRN: [id number]   Date of Service  10/13/2023  HPI/Events of Note  57 y.o. male with a history of clear-cell renal carcinoma presented to the emergency department recently with headache and dizziness and CT scan and subsequent MRI revealing a homogeneously enhancing lesion within the right cerebellum concerning for metastatic disease.  The patient has undergone preoperative stereotactic radiosurgery and today underwent resection.  Now extubated and recovering.  eICU Interventions  Chart reviewed     Intervention Category Evaluation Type: New Patient Evaluation  Henry Russel, P 10/13/2023, 10:06 PM

## 2023-10-14 ENCOUNTER — Inpatient Hospital Stay (HOSPITAL_COMMUNITY): Payer: BC Managed Care – PPO

## 2023-10-14 ENCOUNTER — Other Ambulatory Visit: Payer: Self-pay

## 2023-10-14 ENCOUNTER — Encounter (HOSPITAL_COMMUNITY): Payer: Self-pay | Admitting: Neurosurgery

## 2023-10-14 DIAGNOSIS — C7931 Secondary malignant neoplasm of brain: Secondary | ICD-10-CM | POA: Diagnosis not present

## 2023-10-14 MED ORDER — GADOBUTROL 1 MMOL/ML IV SOLN
10.0000 mL | Freq: Once | INTRAVENOUS | Status: AC | PRN
  Administered 2023-10-14: 10 mL via INTRAVENOUS

## 2023-10-14 MED ORDER — MELATONIN 3 MG PO TABS
3.0000 mg | ORAL_TABLET | Freq: Every evening | ORAL | Status: DC | PRN
  Administered 2023-10-14: 3 mg via ORAL
  Filled 2023-10-14: qty 1

## 2023-10-14 MED ORDER — PANTOPRAZOLE SODIUM 40 MG PO TBEC
40.0000 mg | DELAYED_RELEASE_TABLET | Freq: Every day | ORAL | Status: DC
  Administered 2023-10-14: 40 mg via ORAL
  Filled 2023-10-14: qty 1

## 2023-10-14 NOTE — Evaluation (Signed)
Occupational Therapy Evaluation Patient Details Name: Michael [name] MRN: [id number] DOB: 11/01/1966 Today's Date: 10/14/2023   History of Present Illness Michael [name] is a 57 year old man admitted after planned stereotactic radiosurgery on 10/10/2023 and surgical resection on 10/13/2023 for removal and definitive tumor origin. PMH includes HTN, hypothyroidism, OSA, stage IV clear-cell renal cell carcinoma (initially diagnosed as locally advanced disease in 2020, s/p right nephrectomy 08/27/19, s/p Keytruda and axitinib x 2 cycles but interrupted due to autoimmune hepatitis; evidence of pulmonary metastasis in 2021; s/p cryoablation left renal mass 05/30/21; resumed Keytruda 07/2021-July 2024; solitary brain metastasis 09/2023).   Clinical Impression   Patient evaluated by Occupational Therapy with no further acute OT needs identified. All education has been completed and the patient has no further questions. See below for any follow-up Occupational Therapy or equipment needs. OT to sign off. Thank you for referral.         If plan is discharge home, recommend the following:      Functional Status Assessment  Patient has had a recent decline in their functional status and demonstrates the ability to make significant improvements in function in a reasonable and predictable amount of time.  Equipment Recommendations  None recommended by OT    Recommendations for Other Services       Precautions / Restrictions Precautions Precautions: Fall Restrictions Weight Bearing Restrictions: No      Mobility Bed Mobility Overal bed mobility: Independent                  Transfers Overall transfer level: Needs assistance     Sit to Stand: Supervision                  Balance                                           ADL either performed or assessed with clinical judgement   ADL Overall ADL's : Needs assistance/impaired Eating/Feeding: Independent                    Lower Body Dressing: Supervision/safety Lower Body Dressing Details (indicate cue type and reason): educated on positioning at the eob to help reach feet with hip hike up. Pt able to complete task now without wife (A)             Functional mobility during ADLs: Supervision/safety       Vision Baseline Vision/History: 0 No visual deficits Ability to See in Adequate Light: 0 Adequate Patient Visual Report: No change from baseline Vision Assessment?: No apparent visual deficits     Perception         Praxis         Pertinent Vitals/Pain Pain Assessment Pain Assessment: Faces Faces Pain Scale: Hurts little more Pain Location: incisional pain at posterior head Pain Descriptors / Indicators: Sore Pain Intervention(s): Monitored during session, Repositioned     Extremity/Trunk Assessment Upper Extremity Assessment Upper Extremity Assessment: Overall WFL for tasks assessed   Lower Extremity Assessment Lower Extremity Assessment: Overall WFL for tasks assessed   Cervical / Trunk Assessment Cervical / Trunk Assessment: Neck Surgery   Communication Communication Communication: No apparent difficulties   Cognition Arousal: Alert Behavior During Therapy: WFL for tasks assessed/performed Overall Cognitive Status: Within Functional Limits for tasks assessed  General Comments  VSS    Exercises Exercises: Other exercises Other Exercises Other Exercises: cervical AROM with lateral flexion and cervical rotation. Other Exercises: educated on use of cell phone as an alarm and labeling the alarm Other Exercises: educated on setting up home environment waist to hip height   Shoulder Instructions      Home Living Family/patient expects to be discharged to:: Private residence Living Arrangements: Spouse/significant other Available Help at Discharge: Family;Available 24 hours/day Type of Home:  House Home Access: Level entry     Home Layout: Two level Alternate Level Stairs-Number of Steps: 15 Alternate Level Stairs-Rails: Left Bathroom Shower/Tub: Producer, television/film/video: Handicapped height     Home Equipment: Shower seat;Hand held shower head   Additional Comments: wife works from home. pt works from home. have 2 dogs one is 33month old puppy      Prior Functioning/Environment Prior Level of Function : Independent/Modified Independent;Driving;Working/employed             Mobility Comments: indep without AD, works from home, travels to [place] every 4-5 weeks ADLs Comments: indep        OT Problem List:        OT Treatment/Interventions:      OT Goals(Current goals can be found in the care plan section) Acute Rehab OT Goals Patient Stated Goal: to be able to resume normal activities like golf and tennis Potential to Achieve Goals: Good  OT Frequency:      Co-evaluation              AM-PAC OT "6 Clicks" Daily Activity     Outcome Measure Help from another person eating meals?: None Help from another person taking care of personal grooming?: None Help from another person toileting, which includes using toliet, bedpan, or urinal?: None Help from another person bathing (including washing, rinsing, drying)?: None Help from another person to put on and taking off regular upper body clothing?: None Help from another person to put on and taking off regular lower body clothing?: None 6 Click Score: 24   End of Session Nurse Communication: Mobility status;Precautions  Activity Tolerance: Patient tolerated treatment well Patient left: in bed;with call bell/phone within reach;with bed alarm set;with family/visitor present (reports no sleep for 24 hours)  OT Visit Diagnosis: Unsteadiness on feet (R26.81)                Time: 1914-7829 OT Time Calculation (min): 28 min Charges:  OT General Charges $OT Visit: 1 Visit OT Evaluation $OT Eval  Moderate Complexity: 1 Mod   Brynn, OTR/L  Acute Rehabilitation Services Office: [phone] .   Mateo Flow 10/14/2023, 5:02 PM

## 2023-10-14 NOTE — Progress Notes (Signed)
  NEUROSURGERY PROGRESS NOTE   Pt seen and examined. No issues overnight. Pt with no HA. Overall feels well. Tolerating diet, no real N/V.  EXAM: Temp:  [97.6 F (36.4 C)-99.4 F (37.4 C)] 99.1 F (37.3 C) (12/10 0800) Pulse Rate:  [56-71] 60 (12/10 0800) Resp:  [8-18] 11 (12/10 0800) BP: (109-154)/(76-95) 122/78 (12/10 0800) SpO2:  [92 %-100 %] 96 % (12/10 0800) Arterial Line BP: (122-178)/(82-104) 149/92 (12/09 2130) Weight:  [115.7 kg] 115.7 kg (12/09 1209) Intake/Output      12/09 0701 12/10 0700 12/10 0701 12/11 0700   P.O.  240   I.V. (mL/kg) 992.7 (8.6)    IV Piggyback 700.2    Total Intake(mL/kg) 1692.8 (14.6) 240 (2.1)   Urine (mL/kg/hr) 3100    Total Output 3100    Net -1407.2 +240         Awake, alert, oriented Speech fluent CN intact MAE good strength Wound c/d/I, no drainage/leak  LABS: Lab Results  Component Value Date   CREATININE 1.29 (H) 09/30/2023   BUN 13 09/30/2023   NA 140 09/30/2023   K 3.9 09/30/2023   CL 105 09/30/2023   CO2 26 09/30/2023   Lab Results  Component Value Date   WBC 6.0 09/30/2023   HGB 13.5 09/30/2023   HCT 42.0 09/30/2023   MCV 79.8 (L) 09/30/2023   PLT 350 09/30/2023    IMAGING: MRI reviewed and demonstrates gross total resection of tumor. No hematoma, improved mass effect and no HCP.  IMPRESSION: - 57 y.o. male POD#1 right retrosig for resection of tumor, doing well  PLAN: - Mobilize today - Stable for d/c per patient preference, today or tomorrow   Lisbeth Renshaw, MD [facility]

## 2023-10-14 NOTE — Evaluation (Signed)
Physical Therapy Evaluation Patient Details Name: Michael [name] MRN: [id number] DOB: 05-Jun-1966 Today's Date: 10/14/2023  History of Present Illness  Michael [name] is a 57 year old man admitted after planned stereotactic radiosurgery on 10/10/2023 and surgical resection on 10/13/2023 for removal and definitive tumor origin. PMH includes HTN, hypothyroidism, OSA, stage IV clear-cell renal cell carcinoma (initially diagnosed as locally advanced disease in 2020, s/p right nephrectomy 08/27/19, s/p Keytruda and axitinib x 2 cycles but interrupted due to autoimmune hepatitis; evidence of pulmonary metastasis in 2021; s/p cryoablation left renal mass 05/30/21; resumed Keytruda 07/2021-July 2024; solitary brain metastasis 09/2023).   Clinical Impression  Pt admitted with above. Pt denies dizziness or having a headache. Pt mobilizing well but guarded and cautious. Pt was able to complete stair negotiation as well this date with contact guard assist. Began gentle AROM of cervical spine to tolerance. Acute PT to cont to follow however I don't think patient will need follow up PT upon d/c.        If plan is discharge home, recommend the following: A little help with walking and/or transfers;A little help with bathing/dressing/bathroom   Can travel by private vehicle        Equipment Recommendations None recommended by PT  Recommendations for Other Services       Functional Status Assessment Patient has had a recent decline in their functional status and demonstrates the ability to make significant improvements in function in a reasonable and predictable amount of time.     Precautions / Restrictions Precautions Precautions: Fall Restrictions Weight Bearing Restrictions: No      Mobility  Bed Mobility Overal bed mobility: Needs Assistance Bed Mobility: Supine to Sit     Supine to sit: Supervision     General bed mobility comments: HOB elevated to 45 deg, pt brought self into long sit,  educated that it may be less painful to roll to the L    Transfers Overall transfer level: Needs assistance Equipment used: None Transfers: Sit to/from Stand Sit to Stand: Supervision           General transfer comment: supervision for safety due first time up and tumor in cerebellum area of brain    Ambulation/Gait Ambulation/Gait assistance: Contact guard assist Gait Distance (Feet): 200 Feet Assistive device: None Gait Pattern/deviations: Step-through pattern, Decreased stride length, Staggering right, Staggering left Gait velocity: dec compared to baseline Gait velocity interpretation: 1.31 - 2.62 ft/sec, indicative of limited community ambulator   General Gait Details: initially mildly unsteady with lateral sway however much improved with increased distance. Pt with minimal cervical rotation/movement  Stairs Stairs: Yes Stairs assistance: Contact guard assist Stair Management: One rail Left, Alternating pattern, Step to pattern, Forwards Number of Stairs: 12 General stair comments: pt guarded and cautiously ascended reciprocally using L hand rail, descended step to pattern as pt unable to look down at the step  Wheelchair Mobility     Tilt Bed    Modified Rankin (Stroke Patients Only)       Balance Overall balance assessment: Mild deficits observed, not formally tested                                           Pertinent Vitals/Pain Pain Assessment Pain Assessment: 0-10 Pain Score: 4  Pain Location: incisional pain at posterior head Pain Descriptors / Indicators: Sore Pain Intervention(s): Monitored during session    Home Living  Family/patient expects to be discharged to:: Private residence Living Arrangements: Spouse/significant other Available Help at Discharge: Family;Available 24 hours/day Type of Home: House Home Access: Level entry     Alternate Level Stairs-Number of Steps: 15 Home Layout: Two level Home Equipment: Shower  seat;Hand held shower head      Prior Function Prior Level of Function : Independent/Modified Independent;Driving;Working/employed             Mobility Comments: indep without AD, works from home, travels to [place] every 4-5 weeks ADLs Comments: indep     Extremity/Trunk Assessment   Upper Extremity Assessment Upper Extremity Assessment: Overall WFL for tasks assessed    Lower Extremity Assessment Lower Extremity Assessment: Overall WFL for tasks assessed    Cervical / Trunk Assessment Cervical / Trunk Assessment: Neck Surgery  Communication   Communication Communication: No apparent difficulties  Cognition Arousal: Alert Behavior During Therapy: WFL for tasks assessed/performed Overall Cognitive Status: Within Functional Limits for tasks assessed                                          General Comments General comments (skin integrity, edema, etc.): VSS, incision with dressing over it    Exercises Other Exercises Other Exercises: gentle AROM: cervical rotation L/R, gentle flex/ext with in comfortable range   Assessment/Plan    PT Assessment Patient needs continued PT services  PT Problem List Decreased strength;Decreased range of motion;Decreased activity tolerance;Decreased balance       PT Treatment Interventions Gait training;Stair training;Functional mobility training;Therapeutic activities;Therapeutic exercise;Balance training;Neuromuscular re-education    PT Goals (Current goals can be found in the Care Plan section)  Acute Rehab PT Goals Patient Stated Goal: home tomorrow PT Goal Formulation: With patient Time For Goal Achievement: 10/28/23 Potential to Achieve Goals: Good Additional Goals Additional Goal #1: Pt to score >19 on DGI to indicate minimal falls risk.    Frequency Min 1X/week     Co-evaluation               AM-PAC PT "6 Clicks" Mobility  Outcome Measure Help needed turning from your back to your side while  in a flat bed without using bedrails?: None Help needed moving from lying on your back to sitting on the side of a flat bed without using bedrails?: None Help needed moving to and from a bed to a chair (including a wheelchair)?: None Help needed standing up from a chair using your arms (e.g., wheelchair or bedside chair)?: None Help needed to walk in hospital room?: A Little Help needed climbing 3-5 steps with a railing? : A Little 6 Click Score: 22    End of Session Equipment Utilized During Treatment: Gait belt Activity Tolerance: Patient tolerated treatment well Patient left: in chair;with call bell/phone within reach Nurse Communication: Mobility status PT Visit Diagnosis: Unsteadiness on feet (R26.81)    Time: 0981-1914 PT Time Calculation (min) (ACUTE ONLY): 33 min   Charges:   PT Evaluation $PT Eval Moderate Complexity: 1 Mod PT Treatments $Gait Training: 8-22 mins PT General Charges $$ ACUTE PT VISIT: 1 Visit         Lewis Shock, PT, DPT Acute Rehabilitation Services Secure chat preferred Office #: [phone]   Iona Hansen 10/14/2023, 1:26 PM

## 2023-10-14 NOTE — TOC CM/SW Note (Signed)
Transition of Care [facility]) - Inpatient Brief Assessment   Patient Details  Name: Eber Hawk MRN: [id number] Date of Birth: [birth year]  Transition of Care [facility]) CM/SW Contact:    Mearl Latin, LCSW Phone Number: 10/14/2023, 2:31 PM   Clinical Narrative: Patient admitted from home with spouse s/p resection of tumor. No current TOC needs identified at this time.    Transition of Care Asessment: Insurance and Status: Insurance coverage has been reviewed Patient has primary care physician: Yes Home environment has been reviewed: From home Prior level of function:: Independent Prior/Current Home Services: No current home services Social Determinants of Health Reivew: SDOH reviewed no interventions necessary Readmission risk has been reviewed: Yes Transition of care needs: no transition of care needs at this time

## 2023-10-14 NOTE — Consult Note (Signed)
NAME:  Michael [name], MRN:  [id number], DOB:  1966/08/10, LOS: 1 ADMISSION DATE:  10/13/2023, CONSULTATION DATE:  10/14/23 REFERRING MD:  NSG , CHIEF COMPLAINT:  brain tumor   History of Present Illness:  57 year old man history of renal cell carcinoma status post right nephrectomy 2020 with discovery of a series of metastases most recently brain metastases status postsurgical resection 12/9.  He had some dizziness, imbalance when rising from seated position.  This prompted CNS imaging given his history of cancer.  This revealed a cerebellar lesion.  This was radiated.  Subsequent resected.  Postoperatively doing well.  Pertinent  Medical History  Hypertension, history of renal cell carcinoma  Significant Hospital Events: Including procedures, antibiotic start and stop dates in addition to other pertinent events   12/9 tumor resection of the brain  Interim History / Subjective:    Objective   Blood pressure (!) 143/90, pulse 62, temperature 99.1 F (37.3 C), temperature source Oral, resp. rate 11, height 5\' 6"  (1.676 m), weight 115.7 kg, SpO2 98%.        Intake/Output Summary (Last 24 hours) at 10/14/2023 1454 Last data filed at 10/14/2023 1300 Gross per 24 hour  Intake 1672.82 ml  Output 3575 ml  Net -1902.18 ml   Filed Weights   10/13/23 1209  Weight: 115.7 kg    Examination: General: Lying in the bed in no acute distress HENT: Surgical site dressed clean dry and intact Lungs: Normal work of breathing, on room air Cardiovascular: Regular and rhythm, no murmur Abdomen: Nontender, bowel sounds present Extremities: No lesions or abrasions Neuro: No focal deficits, sensation appears intact  Resolved Hospital Problem list     Assessment & Plan:  Hypothyroidism: -- Continue home Synthroid  History of hypertension: -- Continue amlodipine 7,5 mg daily  Status post tumor resection and the brain: -- Decadron per neurosurgery  History of seasonal allergies: --  Continue home montelukast  Best Practice (right click and "Reselect all SmartList Selections" daily)   Per primary  Labs   CBC: No results for input(s): "WBC", "NEUTROABS", "HGB", "HCT", "MCV", "PLT" in the last 168 hours.  Basic Metabolic Panel: No results for input(s): "NA", "K", "CL", "CO2", "GLUCOSE", "BUN", "CREATININE", "CALCIUM", "MG", "PHOS" in the last 168 hours. GFR: Estimated Creatinine Clearance: 77.4 mL/min (A) (by C-G formula based on SCr of 1.29 mg/dL (H)). No results for input(s): "PROCALCITON", "WBC", "LATICACIDVEN" in the last 168 hours.  Liver Function Tests: No results for input(s): "AST", "ALT", "ALKPHOS", "BILITOT", "PROT", "ALBUMIN" in the last 168 hours. No results for input(s): "LIPASE", "AMYLASE" in the last 168 hours. No results for input(s): "AMMONIA" in the last 168 hours.  ABG No results found for: "PHART", "PCO2ART", "PO2ART", "HCO3", "TCO2", "ACIDBASEDEF", "O2SAT"   Coagulation Profile: No results for input(s): "INR", "PROTIME" in the last 168 hours.  Cardiac Enzymes: No results for input(s): "CKTOTAL", "CKMB", "CKMBINDEX", "TROPONINI" in the last 168 hours.  HbA1C: No results found for: "HGBA1C"  CBG: No results for input(s): "GLUCAP" in the last 168 hours.  Review of Systems:   No chest pain with exertion.  No orthopnea or PND.  Comprehensive review of systems otherwise negative.  Past Medical History:  He,  has a past medical history of Cancer (HCC), Clear cell renal cell carcinoma (HCC), Hypertension, Hypothyroidism, and Sleep apnea.   Surgical History:   Past Surgical History:  Procedure Laterality Date   IR RADIOLOGIST EVAL & MGMT  04/04/2021   no procedures performed   IR RADIOLOGIST EVAL &  MGMT  05/08/2021   no procedures performed   IR RADIOLOGIST EVAL & MGMT  08/12/2022   LUNG BIOPSY  2021   RADIOLOGY WITH ANESTHESIA N/A 05/30/2021   Procedure: CT WITH ANESTHESIA CRYOABLATION;  Surgeon: Irish Lack, MD;  Location: [facility]  ORS;  Service: Radiology;  Laterality: N/A;   right nephrectomy Right 08/2019   TONSILLECTOMY       Social History:   reports that he has never smoked. He has never used smokeless tobacco. He reports that he does not currently use alcohol. He reports that he does not use drugs.   Family History:  His family history is not on file.   Allergies Allergies  Allergen Reactions   Cleocin [Clindamycin] Rash   Penicillins Other (See Comments)    Unknown reaction in childhood   Tape Rash     Home Medications  Prior to Admission medications   Medication Sig Start Date End Date Taking? Authorizing Provider  amLODipine (NORVASC) 5 MG tablet Take 7.5 mg by mouth daily before breakfast. 04/23/21  Yes [provider]  dexamethasone (DECADRON) 4 MG tablet Take 1 tablet (4 mg total) by mouth every 8 (eight) hours. 09/30/23  Yes Cathren Laine, MD  levothyroxine (SYNTHROID) 137 MCG tablet TAKE 1 TABLET(137 MCG) BY MOUTH DAILY BEFORE BREAKFAST 08/04/23  Yes Heilingoetter, Cassandra L, PA-C  montelukast (SINGULAIR) 10 MG tablet Take 10 mg by mouth daily before breakfast.   Yes [provider]  tadalafil (CIALIS) 20 MG tablet Take 10-20 mg by mouth daily as needed for erectile dysfunction.   Yes [provider]  LORazepam (ATIVAN) 1 MG tablet Take 1 tablet (1 mg total) by mouth as needed for anxiety (take one tablet 30 minutes prior to MRI and may repeat once, just prior to scan, if needed). Patient not taking: Reported on 10/08/2023 10/07/23   Bruning, Ashlyn, PA-C  methocarbamol (ROBAXIN) 500 MG tablet Take 500 mg by mouth every 8 (eight) hours as needed for muscle spasms.    [provider]     Critical care time: n/a    Karren Burly, MD See Loretha Stapler

## 2023-10-15 ENCOUNTER — Other Ambulatory Visit: Payer: Self-pay | Admitting: Radiation Therapy

## 2023-10-15 DIAGNOSIS — C7931 Secondary malignant neoplasm of brain: Secondary | ICD-10-CM

## 2023-10-15 MED ORDER — METHYLPREDNISOLONE 4 MG PO TBPK: ORAL_TABLET | ORAL | 0 refills | Status: DC

## 2023-10-15 NOTE — Discharge Summary (Signed)
Physician Discharge Summary  Patient ID: Michael [name] MRN: [id number] DOB/AGE: 11-Jul-1966 57 y.o.  Admit date: 10/13/2023 Discharge date: 10/15/2023  Admission Diagnoses:  Cerebellar metastasis  Discharge Diagnoses:  Same Principal Problem:   Metastasis to brain [facility]) Active Problems:   Metastatic cancer to brain [facility])   Discharged Condition: Stable  Hospital Course:  Michael [name] is a 57 y.o. male admitted after uncomplicated right retrosigmoid craniectomy for resection of cerebellar tumor.  Patient was at neurologic baseline postoperatively.  He was ambulating well, tolerating diet, and requested discharge home on postoperative day #2.  Treatments: Surgery -right retrosigmoid craniectomy for resection of tumor  Discharge Exam: Blood pressure 130/84, pulse (!) 59, temperature 98.2 F (36.8 C), temperature source Oral, resp. rate 14, height 5\' 6"  (1.676 m), weight 115.7 kg, SpO2 98%. Awake, alert, oriented Speech fluent, appropriate CN grossly intact 5/5 BUE/BLE Wound c/d/i  Disposition: Discharge disposition: 01-Home or Self Care       Discharge Instructions     Call MD for:  redness, tenderness, or signs of infection (pain, swelling, redness, odor or green/yellow discharge around incision site)   Complete by: As directed    Call MD for:  temperature >100.4   Complete by: As directed    Diet - low sodium heart healthy   Complete by: As directed    Discharge instructions   Complete by: As directed    Walk at home as much as possible, at least 4 times / day   Increase activity slowly   Complete by: As directed    Lifting restrictions   Complete by: As directed    No lifting > 10 lbs   May shower / Bathe   Complete by: As directed    48 hours after surgery   May walk up steps   Complete by: As directed    No dressing needed   Complete by: As directed    Other Restrictions   Complete by: As directed    No bending/twisting at waist       Allergies as of 10/15/2023       Reactions   Cleocin [clindamycin] Rash   Penicillins Other (See Comments)   Unknown reaction in childhood   Tape Rash        Medication List     STOP taking these medications    dexamethasone 4 MG tablet Commonly known as: DECADRON   LORazepam 1 MG tablet Commonly known as: ATIVAN       TAKE these medications    amLODipine 5 MG tablet Commonly known as: NORVASC Take 7.5 mg by mouth daily before breakfast.   levothyroxine 137 MCG tablet Commonly known as: SYNTHROID TAKE 1 TABLET(137 MCG) BY MOUTH DAILY BEFORE BREAKFAST   methocarbamol 500 MG tablet Commonly known as: ROBAXIN Take 500 mg by mouth every 8 (eight) hours as needed for muscle spasms.   methylPREDNISolone 4 MG Tbpk tablet Commonly known as: MEDROL DOSEPAK Take as directed on package   montelukast 10 MG tablet Commonly known as: SINGULAIR Take 10 mg by mouth daily before breakfast.   tadalafil 20 MG tablet Commonly known as: CIALIS Take 10-20 mg by mouth daily as needed for erectile dysfunction.               Discharge Care Instructions  (From admission, onward)           Start     Ordered   10/15/23 0000  No dressing needed  10/15/23 1003            Follow-up Information     Lisbeth Renshaw, MD Follow up.   Specialty: Neurosurgery Contact information: [address] [place] [address] [phone]                 Signed: Jackelyn Hoehn 10/15/2023, 10:04 AM

## 2023-10-15 NOTE — Progress Notes (Signed)
  NEUROSURGERY PROGRESS NOTE   Pt seen and examined. No issues overnight. Pt with no HA. Overall feels well. Tolerating diet. Ambulating including stair.Marland Kitchen  EXAM: Temp:  [97.6 F (36.4 C)-99.1 F (37.3 C)] 98.2 F (36.8 C) (12/11 0800) Pulse Rate:  [57-65] 59 (12/11 0850) Resp:  [10-18] 14 (12/11 0850) BP: (125-147)/(80-95) 130/84 (12/11 0850) SpO2:  [98 %] 98 % (12/11 0850) Intake/Output      12/10 0701 12/11 0700 12/11 0701 12/12 0700   P.O. 3843 240   I.V. (mL/kg)     IV Piggyback     Total Intake(mL/kg) 3843 (33.2) 240 (2.1)   Urine (mL/kg/hr) 475 (0.2)    Stool 0    Total Output 475    Net +3368 +240        Urine Occurrence 5 x    Stool Occurrence 1 x     Awake, alert, oriented Speech fluent CN intact MAE good strength Wound c/d/I, no drainage/leak  LABS: Lab Results  Component Value Date   CREATININE 1.29 (H) 09/30/2023   BUN 13 09/30/2023   NA 140 09/30/2023   K 3.9 09/30/2023   CL 105 09/30/2023   CO2 26 09/30/2023   Lab Results  Component Value Date   WBC 6.0 09/30/2023   HGB 13.5 09/30/2023   HCT 42.0 09/30/2023   MCV 79.8 (L) 09/30/2023   PLT 350 09/30/2023     IMPRESSION: - 57 y.o. male POD#2 right retrosig for resection of tumor, doing well  PLAN: - d/c home today   Lisbeth Renshaw, MD [facility]

## 2023-10-16 ENCOUNTER — Other Ambulatory Visit: Payer: Self-pay

## 2023-10-16 LAB — SURGICAL PATHOLOGY

## 2023-10-17 NOTE — Addendum Note (Signed)
Encounter addended by: Lisbeth Renshaw, MD on: 10/17/2023 11:37 AM  Actions taken: Clinical Note Signed

## 2023-10-17 NOTE — Op Note (Signed)
  Name: Michael [name]  MRN: [id number]  Date: 10/10/2023   DOB: [birth year]  Stereotactic Radiosurgery Operative Note  PRE-OPERATIVE DIAGNOSIS:  Solitary Brain Metastasis  POST-OPERATIVE DIAGNOSIS:  Solitary Brain Metastasis  PROCEDURE:  Stereotactic Radiosurgery  SURGEON:  Jackelyn Hoehn, MD  NARRATIVE: The patient underwent a radiation treatment planning session in the radiation oncology simulation suite under the care of the radiation oncology physician and physicist.  I participated closely in the radiation treatment planning afterwards. The patient underwent planning CT which was fused to 3T high resolution MRI with 1 mm axial slices.  These images were fused on the planning system.  We contoured the gross target volumes and subsequently expanded this to yield the Planning Target Volume. I actively participated in the planning process.  I helped to define and review the target contours and also the contours of the optic pathway, eyes, brainstem and selected nearby organs at risk.  All the dose constraints for critical structures were reviewed and compared to AAPM Task Group 101.  The prescription dose conformity was reviewed.  I approved the plan electronically.    Accordingly, Thereasa Distance was brought to the TrueBeam stereotactic radiation treatment linac and placed in the custom immobilization mask.  The patient was aligned according to the IR fiducial markers with BrainLab Exactrac, then orthogonal x-rays were used in ExacTrac with the 6DOF robotic table and the shifts were made to align the patient  Jamarco Schrupp received stereotactic radiosurgery uneventfully to the right cerebellar tumor.  The detailed description of the procedure is recorded in the radiation oncology procedure note.  I was present for the duration of the procedure.  DISPOSITION:  Following delivery, the patient was transported to nursing in stable condition and monitored for possible acute effects to be  discharged to home in stable condition with follow-up in one month.  Jackelyn Hoehn, MD 10/17/2023 11:37 AM

## 2023-10-20 ENCOUNTER — Inpatient Hospital Stay: Payer: BC Managed Care – PPO

## 2023-11-18 ENCOUNTER — Ambulatory Visit
Admission: RE | Admit: 2023-11-18 | Discharge: 2023-11-18 | Disposition: A | Discharge status: Home / Self Care | Payer: BC Managed Care – PPO | Source: Ambulatory Visit | Attending: Radiation Oncology | Admitting: Radiation Oncology

## 2023-11-18 NOTE — Progress Notes (Signed)
  Radiation Oncology         [phone]) [phone] ________________________________  Name: Michael [name] MRN: [id number]  Date of Service: 11/18/2023  DOB: 21-Jul-1966  Post Treatment Telephone Note  Diagnosis:  C79.31 Secondary malignant neoplasm of brain (as documented in provider EOT note)   The patient was available for call today.  The patient did not note fatigue during radiation. The patient did not note hair loss or skin changes in the field of radiation during therapy. The patient is not taking dexamethasone . The patient does not have symptoms of  weakness or loss of control of the extremities. The patient does not have symptoms of headache. The patient does not have symptoms of seizure or uncontrolled movement. The patient does not have symptoms of changes in vision. The patient does not have changes in speech. The patient does not have confusion.   The patient was counseled that he  will be contacted by our brain and spine navigator to schedule surveillance imaging. The patient was encouraged to call if he  have not received a call to schedule imaging, or if he  develops concerns or questions regarding radiation. The patient will also continue to follow up with Dr. Patrcia in Rad/Onc.  This concludes the interaction.  Rosaline Minerva, LPN

## 2023-11-27 ENCOUNTER — Other Ambulatory Visit: Payer: Self-pay | Admitting: Physician Assistant

## 2023-11-27 DIAGNOSIS — E039 Hypothyroidism, unspecified: Secondary | ICD-10-CM

## 2023-11-27 DIAGNOSIS — G4733 Obstructive sleep apnea (adult) (pediatric): Secondary | ICD-10-CM | POA: Diagnosis not present

## 2023-12-08 ENCOUNTER — Telehealth: Payer: Self-pay | Admitting: Radiation Therapy

## 2023-12-08 NOTE — Telephone Encounter (Signed)
I left a detailed message for [name] informing him of the brain MRI and telephone follow-up we have scheduled for him in March. My contact information was included for him to call back with questions or concerns.   Jalene Mullet R.T.(R)(T) Radiation Special Procedures Lead

## 2023-12-09 DIAGNOSIS — R222 Localized swelling, mass and lump, trunk: Secondary | ICD-10-CM | POA: Diagnosis not present

## 2023-12-10 ENCOUNTER — Other Ambulatory Visit (HOSPITAL_COMMUNITY): Payer: Self-pay | Admitting: Nurse Practitioner

## 2023-12-10 ENCOUNTER — Ambulatory Visit (HOSPITAL_COMMUNITY)
Admission: RE | Admit: 2023-12-10 | Discharge: 2023-12-10 | Disposition: A | Discharge status: Home / Self Care | Payer: BC Managed Care – PPO | Source: Ambulatory Visit | Attending: Nurse Practitioner | Admitting: Nurse Practitioner

## 2023-12-10 DIAGNOSIS — M85611 Other cyst of bone, right shoulder: Secondary | ICD-10-CM | POA: Diagnosis not present

## 2023-12-10 DIAGNOSIS — R2231 Localized swelling, mass and lump, right upper limb: Secondary | ICD-10-CM | POA: Diagnosis not present

## 2023-12-11 ENCOUNTER — Other Ambulatory Visit (HOSPITAL_COMMUNITY): Payer: Self-pay | Admitting: Internal Medicine

## 2023-12-11 DIAGNOSIS — I129 Hypertensive chronic kidney disease with stage 1 through stage 4 chronic kidney disease, or unspecified chronic kidney disease: Secondary | ICD-10-CM | POA: Diagnosis not present

## 2023-12-11 DIAGNOSIS — M7989 Other specified soft tissue disorders: Secondary | ICD-10-CM

## 2023-12-12 ENCOUNTER — Encounter: Payer: Self-pay | Admitting: Physician Assistant

## 2023-12-12 ENCOUNTER — Ambulatory Visit (HOSPITAL_COMMUNITY)
Admission: RE | Admit: 2023-12-12 | Discharge: 2023-12-12 | Disposition: A | Discharge status: Home / Self Care | Payer: BC Managed Care – PPO | Source: Ambulatory Visit | Attending: Internal Medicine | Admitting: Internal Medicine

## 2023-12-12 DIAGNOSIS — M7989 Other specified soft tissue disorders: Secondary | ICD-10-CM | POA: Insufficient documentation

## 2023-12-12 DIAGNOSIS — I6523 Occlusion and stenosis of bilateral carotid arteries: Secondary | ICD-10-CM | POA: Diagnosis not present

## 2023-12-12 DIAGNOSIS — R221 Localized swelling, mass and lump, neck: Secondary | ICD-10-CM | POA: Diagnosis not present

## 2023-12-12 DIAGNOSIS — M898X8 Other specified disorders of bone, other site: Secondary | ICD-10-CM | POA: Diagnosis not present

## 2023-12-12 MED ORDER — IOHEXOL 300 MG/ML  SOLN
75.0000 mL | Freq: Once | INTRAMUSCULAR | Status: AC | PRN
  Administered 2023-12-12: 75 mL via INTRAVENOUS

## 2023-12-15 ENCOUNTER — Other Ambulatory Visit: Payer: Self-pay | Admitting: Internal Medicine

## 2023-12-15 ENCOUNTER — Telehealth: Payer: Self-pay | Admitting: Pharmacist

## 2023-12-15 ENCOUNTER — Inpatient Hospital Stay: Payer: BC Managed Care – PPO | Attending: Oncology

## 2023-12-15 ENCOUNTER — Other Ambulatory Visit (HOSPITAL_COMMUNITY): Payer: Self-pay

## 2023-12-15 ENCOUNTER — Inpatient Hospital Stay: Payer: BC Managed Care – PPO | Attending: Oncology | Admitting: Internal Medicine

## 2023-12-15 ENCOUNTER — Encounter: Payer: Self-pay | Admitting: Internal Medicine

## 2023-12-15 ENCOUNTER — Telehealth: Payer: Self-pay | Admitting: Pharmacy Technician

## 2023-12-15 VITALS — BP 139/89 | HR 65 | Temp 98.4°F | Resp 17 | Wt 266.3 lb

## 2023-12-15 DIAGNOSIS — Z9889 Other specified postprocedural states: Secondary | ICD-10-CM | POA: Diagnosis not present

## 2023-12-15 DIAGNOSIS — C649 Malignant neoplasm of unspecified kidney, except renal pelvis: Secondary | ICD-10-CM

## 2023-12-15 DIAGNOSIS — R21 Rash and other nonspecific skin eruption: Secondary | ICD-10-CM | POA: Insufficient documentation

## 2023-12-15 DIAGNOSIS — K7689 Other specified diseases of liver: Secondary | ICD-10-CM | POA: Diagnosis not present

## 2023-12-15 DIAGNOSIS — C641 Malignant neoplasm of right kidney, except renal pelvis: Secondary | ICD-10-CM

## 2023-12-15 DIAGNOSIS — C642 Malignant neoplasm of left kidney, except renal pelvis: Secondary | ICD-10-CM | POA: Insufficient documentation

## 2023-12-15 DIAGNOSIS — C7951 Secondary malignant neoplasm of bone: Secondary | ICD-10-CM | POA: Diagnosis not present

## 2023-12-15 DIAGNOSIS — Z880 Allergy status to penicillin: Secondary | ICD-10-CM | POA: Diagnosis not present

## 2023-12-15 DIAGNOSIS — C7800 Secondary malignant neoplasm of unspecified lung: Secondary | ICD-10-CM | POA: Insufficient documentation

## 2023-12-15 DIAGNOSIS — Z79890 Hormone replacement therapy (postmenopausal): Secondary | ICD-10-CM | POA: Diagnosis not present

## 2023-12-15 DIAGNOSIS — I700 Atherosclerosis of aorta: Secondary | ICD-10-CM | POA: Insufficient documentation

## 2023-12-15 DIAGNOSIS — M545 Low back pain, unspecified: Secondary | ICD-10-CM | POA: Insufficient documentation

## 2023-12-15 DIAGNOSIS — C7931 Secondary malignant neoplasm of brain: Secondary | ICD-10-CM | POA: Insufficient documentation

## 2023-12-15 DIAGNOSIS — K754 Autoimmune hepatitis: Secondary | ICD-10-CM | POA: Diagnosis not present

## 2023-12-15 DIAGNOSIS — Z881 Allergy status to other antibiotic agents status: Secondary | ICD-10-CM | POA: Diagnosis not present

## 2023-12-15 DIAGNOSIS — Z905 Acquired absence of kidney: Secondary | ICD-10-CM | POA: Insufficient documentation

## 2023-12-15 DIAGNOSIS — Z79620 Long term (current) use of immunosuppressive biologic: Secondary | ICD-10-CM | POA: Diagnosis not present

## 2023-12-15 DIAGNOSIS — Z5112 Encounter for antineoplastic immunotherapy: Secondary | ICD-10-CM | POA: Diagnosis not present

## 2023-12-15 DIAGNOSIS — Z79899 Other long term (current) drug therapy: Secondary | ICD-10-CM | POA: Insufficient documentation

## 2023-12-15 DIAGNOSIS — Z9226 Personal history of immune checkpoint inhibitor therapy: Secondary | ICD-10-CM | POA: Insufficient documentation

## 2023-12-15 LAB — CMP (CANCER CENTER ONLY)
ALT: 22 U/L (ref 0–44)
AST: 21 U/L (ref 15–41)
Albumin: 4.1 g/dL (ref 3.5–5.0)
Alkaline Phosphatase: 85 U/L (ref 38–126)
Anion gap: 6 (ref 5–15)
BUN: 20 mg/dL (ref 6–20)
CO2: 26 mmol/L (ref 22–32)
Calcium: 8.9 mg/dL (ref 8.9–10.3)
Chloride: 108 mmol/L (ref 98–111)
Creatinine: 1.32 mg/dL — ABNORMAL HIGH (ref 0.61–1.24)
GFR, Estimated: >60 mL/min (ref >60)
Glucose, Bld: 107 mg/dL — ABNORMAL HIGH (ref 70–99)
Potassium: 4.2 mmol/L (ref 3.5–5.1)
Sodium: 140 mmol/L (ref 135–145)
Total Bilirubin: 0.4 mg/dL (ref 0.0–1.2)
Total Protein: 6.9 g/dL (ref 6.5–8.1)

## 2023-12-15 LAB — CBC WITH DIFFERENTIAL (CANCER CENTER ONLY)
Abs Immature Granulocytes: 0.01 10*3/uL (ref 0.00–0.07)
Basophils Absolute: 0 10*3/uL (ref 0.0–0.1)
Basophils Relative: 1 %
Eosinophils Absolute: 0.3 10*3/uL (ref 0.0–0.5)
Eosinophils Relative: 5 %
HCT: 37.8 % — ABNORMAL LOW (ref 39.0–52.0)
Hemoglobin: 12.1 g/dL — ABNORMAL LOW (ref 13.0–17.0)
Immature Granulocytes: 0 %
Lymphocytes Relative: 36 %
Lymphs Abs: 2.1 10*3/uL (ref 0.7–4.0)
MCH: 26 pg (ref 26.0–34.0)
MCHC: 32 g/dL (ref 30.0–36.0)
MCV: 81.3 fL (ref 80.0–100.0)
Monocytes Absolute: 0.6 10*3/uL (ref 0.1–1.0)
Monocytes Relative: 10 %
Neutro Abs: 2.7 10*3/uL (ref 1.7–7.7)
Neutrophils Relative %: 48 %
Platelet Count: 279 10*3/uL (ref 150–400)
RBC: 4.65 MIL/uL (ref 4.22–5.81)
RDW: 15.5 % (ref 11.5–15.5)
WBC Count: 5.7 10*3/uL (ref 4.0–10.5)
nRBC: 0 % (ref 0.0–0.2)

## 2023-12-15 LAB — LACTATE DEHYDROGENASE: LDH: 177 U/L (ref 98–192)

## 2023-12-15 LAB — TSH: TSH: 11.957 u[IU]/mL — ABNORMAL HIGH (ref 0.350–4.500)

## 2023-12-15 MED ORDER — CABOMETYX 40 MG PO TABS
40.0000 mg | ORAL_TABLET | Freq: Every day | ORAL | 3 refills | Status: DC
Start: 2023-12-15 — End: 2024-01-06
  Filled 2023-12-23: qty 15, 15d supply, fill #0

## 2023-12-15 MED ORDER — PROCHLORPERAZINE MALEATE 10 MG PO TABS
10.0000 mg | ORAL_TABLET | Freq: Four times a day (QID) | ORAL | 1 refills | Status: DC | PRN
Start: 2023-12-15 — End: 2024-04-27

## 2023-12-15 MED ORDER — LORAZEPAM 0.5 MG PO TABS: ORAL_TABLET | ORAL | 0 refills | Status: DC

## 2023-12-15 MED ORDER — ONDANSETRON HCL 8 MG PO TABS: 8.0000 mg | ORAL_TABLET | Freq: Three times a day (TID) | ORAL | 1 refills | Status: DC | PRN

## 2023-12-15 NOTE — Progress Notes (Signed)
 DISCONTINUE OFF PATHWAY REGIMEN - Renal Cell   [id number]:Pembrolizumab  200 mg IV D1 q21 Days:   A cycle is every 21 days:     Pembrolizumab    **Always confirm dose/schedule in your pharmacy ordering system**  PRIOR TREATMENT: Off Pathway: Pembrolizumab  200 mg IV D1 q21 Days  START OFF PATHWAY REGIMEN - Renal Cell   [id number]:Cabozantinib  (Cabometyx ) 40 mg PO Daily D1-28 + Nivolumab  480 mg IV D1 q28 Days:   A cycle is every 28 days:     Cabozantinib  (tablet)      Nivolumab    **Always confirm dose/schedule in your pharmacy ordering system**  Patient Characteristics: Stage IV (Unresected T4M0 or Any T, M1)/Metastatic Disease, Clear Cell, First Line, Intermediate or Poor Risk Therapeutic Status: Stage IV (Unresected T4M0 or Any T, M1)/Metastatic Disease Histology: Clear Cell Line of Therapy: First Line Risk Status: Intermediate Risk Intent of Therapy: Non-Curative / Palliative Intent, Discussed with Patient

## 2023-12-15 NOTE — Telephone Encounter (Signed)
Oral Oncology Patient Advocate Encounter   Received notification that prior authorization for Cabometyx is required.   PA submitted on 12/15/23 PA submitted via fax. Status is pending     Jinger Neighbors, CPhT-Adv Oncology Pharmacy Patient Advocate [facility] Direct Number: [phone]  Fax: [phone]

## 2023-12-15 NOTE — Telephone Encounter (Signed)
 Oral Oncology Pharmacist Encounter  Received new prescription for Cabometyx  (cabozantinib ) for the treatment of metastatic clear-cell renal cell carcinoma in conjunction with nivolumab , planned duration until disease progression or unacceptable drug toxicity.   CBC w/ Diff and CMP from 12/15/23 assessed, noted patient Scr of 1.32 mg/dL (CrCl ~ 40.9 mL/min) - no baseline renal dose adjustments required. Patient BP in clinic 139/89 mmHg on 12/15/23. Prescription dose and frequency assessed for appropriateness.  Current medication list in Epic reviewed, no relevant/significant DDIs with Cabometyx  identified.  Evaluated chart and no patient barriers to medication adherence noted.   Patient agreement for treatment documented in MD note on 12/15/23.  Prescription has been e-scribed to the [facility] for benefits analysis and approval.  Oral Oncology Clinic will continue to follow for insurance authorization, copayment issues, initial counseling and start date.  Jude Norton, PharmD, BCPS, BCOP Hematology/Oncology Clinical Pharmacist [facility] Oral Chemotherapy Navigation Clinics [phone] 12/15/2023 9:50 AM

## 2023-12-15 NOTE — Progress Notes (Signed)
 [facility] Telephone:([phone]) [phone]   Fax:([phone]  OFFICE PROGRESS NOTE  [name], MD [address] [place] [address]  DIAGNOSIS: Stage IV clear-cell renal cell carcinoma initially diagnosed as locally advanced disease in 2020 with evidence of pulmonary metastasis in 2021.  PRIOR THERAPY: 1) status post right nephrectomy on August 27, 2019 in [place]  and the final pathology showed clear-cell renal cell carcinoma nuclear grade 3 with 10% necrosis and negative margin with the final pathologic stage of T2a, NX. 2) status post treatment with Keytruda  and axitinib  started March 2021 for 2 cycles interrupted because of autoimmune hepatitis.  3) axitinib  5 mg daily started July 05, 2020 discontinued after 1 week because of elevated LFTs. 4) status post cryoablation of left renal mass on May 30, 2021. 5) Keytruda  200 Mg IV every 3 weeks started July 27, 2021 and then switch it to 400 Mg every 6 weeks on November 09, 2021.  Last dose was given July 2024. 6) the patient had solitary brain metastasis in November 2024 and currently undergoing treatment with SRS and this will be followed by craniotomy and surgical resection on October 13, 2023.  CURRENT THERAPY: Observation.  INTERVAL HISTORY: Michael [name] 58 y.o. male returns to the clinic today for follow-up visit accompanied by his wife Michael [name].Discussed the use of AI scribe software for clinical note transcription with the patient, who gave verbal consent to proceed.  History of Present Illness   Michael [name] "Michael [name]" is a 58 year old male with metastatic pulmonary nodules from kidney cancer who presents with a growing mass on the anterior chest. He is accompanied by his wife.  He has a growing mass on the anterior chest, located at the upper sternum near the right clavicle and neck. The mass was first noticed at least a month ago and was initially thought to be an injury from running into a pole  during the summer. It has since increased in size and is tender, described as feeling like 'a bone almost'. A CT scan in November did not mention this mass, but he has felt it getting bigger since then.  He has a history of metastatic pulmonary nodules originating from kidney cancer. Previous treatments included Keytruda  and axitinib , which were discontinued due to liver enzyme issues related to immune therapy-mediated hepatitis. He completed two years of Keytruda  treatment in July 2024. In November 2024, he was found to have a solitary brain metastasis, which was treated with surgery and SRS radiation.  He reports soreness in his shoulder blade and lower back, particularly after playing tennis, which he did on Thursday and Friday. He has experienced intermittent back issues over the past couple of months. No headaches or changes in vision.  He has gained 10 to 12 pounds in the last month, which he attributes to decreased physical activity, particularly not playing tennis during the winter. He mentions that he has not been as active due to the winter season, which has affected his exercise routine.        MEDICAL HISTORY: Past Medical History:  Diagnosis Date   Cancer [facility])    Renal cell carcinoma   Clear cell renal cell carcinoma (HCC)    Hypertension    Hypothyroidism    Sleep apnea    wears cpap    ALLERGIES:  is allergic to cleocin [clindamycin], penicillins, and tape.  MEDICATIONS:  Current Outpatient Medications  Medication Sig Dispense Refill   amLODipine  (NORVASC ) 5 MG tablet Take  7.5 mg by mouth daily before breakfast.     levothyroxine  (SYNTHROID ) 137 MCG tablet TAKE 1 TABLET(137 MCG) BY MOUTH DAILY BEFORE BREAKFAST 30 tablet 2   methocarbamol  (ROBAXIN ) 500 MG tablet Take 500 mg by mouth every 8 (eight) hours as needed for muscle spasms.     methylPREDNISolone  (MEDROL  DOSEPAK) 4 MG TBPK tablet Take as directed on package 1 each 0   montelukast  (SINGULAIR ) 10 MG tablet Take  10 mg by mouth daily before breakfast.     tadalafil (CIALIS) 20 MG tablet Take 10-20 mg by mouth daily as needed for erectile dysfunction.     No current facility-administered medications for this visit.    SURGICAL HISTORY:  Past Surgical History:  Procedure Laterality Date   APPLICATION OF CRANIAL NAVIGATION N/A 10/13/2023   Procedure: APPLICATION OF CRANIAL NAVIGATION;  Surgeon: Augusto Blonder, MD;  Location: [facility] OR;  Service: Neurosurgery;  Laterality: N/A;   CRANIOTOMY N/A 10/13/2023   Procedure: SUBOCCIPITAL   CRANIOTOMY TUMOR EXCISION;  Surgeon: Augusto Blonder, MD;  Location: [facility] OR;  Service: Neurosurgery;  Laterality: N/A;   IR RADIOLOGIST EVAL & MGMT  04/04/2021   no procedures performed   IR RADIOLOGIST EVAL & MGMT  05/08/2021   no procedures performed   IR RADIOLOGIST EVAL & MGMT  08/12/2022   LUNG BIOPSY  2021   RADIOLOGY WITH ANESTHESIA N/A 05/30/2021   Procedure: CT WITH ANESTHESIA CRYOABLATION;  Surgeon: Erica Hau, MD;  Location: [facility] ORS;  Service: Radiology;  Laterality: N/A;   right nephrectomy Right 08/2019   TONSILLECTOMY      REVIEW OF SYSTEMS:  Constitutional: positive for fatigue Eyes: negative Ears, nose, mouth, throat, and face: negative Respiratory: positive for pleurisy/chest pain Cardiovascular: negative Gastrointestinal: negative Genitourinary:negative Integument/breast: negative Hematologic/lymphatic: negative Musculoskeletal:negative Neurological: positive for weakness Behavioral/Psych: negative Endocrine: negative Allergic/Immunologic: negative   PHYSICAL EXAMINATION: General appearance: alert, cooperative, appears stated age, fatigued, and no distress Head: Normocephalic, without obvious abnormality, atraumatic Neck: no adenopathy, no JVD, supple, symmetrical, trachea midline, and thyroid  not enlarged, symmetric, no tenderness/mass/nodules Lymph nodes: Cervical, supraclavicular, and axillary nodes normal. Resp: clear to auscultation  bilaterally Back: symmetric, no curvature. ROM normal. No CVA tenderness. Cardio: regular rate and rhythm, S1, S2 normal, no murmur, click, rub or gallop GI: soft, non-tender; bowel sounds normal; no masses,  no organomegaly Extremities: extremities normal, atraumatic, no cyanosis or edema Neurologic: Alert and oriented X 3, normal strength and tone. Normal symmetric reflexes. Normal coordination and gait  ECOG PERFORMANCE STATUS: 1 - Symptomatic but completely ambulatory  Blood pressure 139/89, pulse 65, temperature 98.4 F (36.9 C), temperature source Temporal, resp. rate 17, weight 266 lb 4.8 oz (120.8 kg), SpO2 97%.  LABORATORY DATA: Lab Results  Component Value Date   WBC 5.7 12/15/2023   HGB 12.1 (L) 12/15/2023   HCT 37.8 (L) 12/15/2023   MCV 81.3 12/15/2023   PLT 279 12/15/2023      Chemistry      Component Value Date/Time   NA 140 09/30/2023 1255   K 3.9 09/30/2023 1255   CL 105 09/30/2023 1255   CO2 26 09/30/2023 1255   BUN 13 09/30/2023 1255   CREATININE 1.29 (H) 09/30/2023 1255   CREATININE 1.28 (H) 09/29/2023 0907      Component Value Date/Time   CALCIUM 9.3 09/30/2023 1255   ALKPHOS 102 09/30/2023 1255   AST 29 09/30/2023 1255   AST 22 09/29/2023 0907   ALT 25 09/30/2023 1255   ALT 20 09/29/2023 0907  BILITOT 0.6 09/30/2023 1255   BILITOT 0.5 09/29/2023 1610       RADIOGRAPHIC STUDIES: CT SOFT TISSUE NECK W CONTRAST Result Date: 12/12/2023 CLINICAL DATA:  Soft tissue mass identified on chest ultrasound. EXAM: CT NECK WITH CONTRAST TECHNIQUE: Multidetector CT imaging of the neck was performed using the standard protocol following the bolus administration of intravenous contrast. RADIATION DOSE REDUCTION: This exam was performed according to the departmental dose-optimization program which includes automated exposure control, adjustment of the mA and/or kV according to patient size and/or use of iterative reconstruction technique. CONTRAST:  75mL OMNIPAQUE   IOHEXOL  300 MG/ML  SOLN COMPARISON:  Soft tissue ultrasound 12/10/2023. FINDINGS: Pharynx and larynx: No focal mucosal or submucosal lesions are present. The nasopharynx is clear. The soft palate and tongue base are within normal limits. Vallecula and epiglottis are within normal limits. Aryepiglottic folds and piriform sinuses are clear. Insert no cords Salivary glands: The submandibular and parotid glands and ducts are within normal limits. Thyroid : Normal Lymph nodes: A right posterior triangle node measures 18.5 mm. No other focal enlarged nodes are present in the cervical chains. Vascular: Minimal calcifications are present at the carotid bifurcations bilaterally. No focal stenosis is present. Limited intracranial: Right suboccipital craniotomy is noted. The visualized intracranial contents are normal. Visualized orbits: The globes and orbits are within normal limits. Mastoids and visualized paranasal sinuses: The paranasal sinuses and mastoid air cells are clear. Skeleton: The vertebral body heights are normal. Slight reversal of the normal cervical lordosis is present. An aggressive lytic lesion involving the anterior right first rib and right side of the sternum has progressed from the earlier studies, now measuring 6.7 x 3.2 x 3.2 cm. No other focal osseous lesions are present. Upper chest: The lung apices are clear. IMPRESSION: 1. Aggressive lytic lesion involving the anterior right first rib and right side of the sternum has progressed from the earlier studies, now measuring 6.7 x 3.2 x 3.2 cm. This likely represents a focal metastasis in this patient with a history of metastatic renal cell carcinoma. 2. 18.5 mm right posterior triangle node is concerning for metastatic disease. 3. No other focal enlarged nodes in the cervical chains. 4. Right suboccipital craniotomy. Electronically Signed   By: Audree Leas M.D.   On: 12/12/2023 13:35   US  CHEST SOFT TISSUE Result Date: 12/10/2023 : PROCEDURE:  US  SOFT TISSUE HISTORY: Patient is a 58 y/o M with right medial supraclavicular cystic like mass. COMPARISON: None available TECHNIQUE: Two-dimensional grayscale and color Doppler ultrasound of the right supraclavicular region was performed. FINDINGS: There is a heterogeneous lesion with internal vascularity measuring 2.9 x 2.2 x 1.5 cm identified within the area of patient's palpable lump in the right supraclavicular region of the neck. IMPRESSION: 1. Heterogeneous lesion with internal vascularity measuring 2.9 cm within the right supraclavicular region of the neck. Recommend contrast-enhanced CT or tissue sampling for further evaluation. Thank you for allowing us  to assist in the care of this patient. Electronically Signed   By: Beula Brunswick M.D.   On: 12/10/2023 13:45    ASSESSMENT AND PLAN: This is a very pleasant 58 years old white male with Stage IV clear-cell renal cell carcinoma initially diagnosed as locally advanced disease in 2020 with evidence of pulmonary metastasis in 2021.  He is status post right nephrectomy on August 27, 2019 in [place]  and the final pathology showed clear-cell renal cell carcinoma nuclear grade 3 with 10% necrosis and negative margin with the final pathologic stage of T2a, NX.  The patient is status post treatment with Keytruda  and axitinib  started March 2021 for 2 cycles interrupted because of autoimmune hepatitis. He then  started Axitinib  5 mg daily on July 05, 2020 discontinued after 1 week because of elevated LFTs. He is also status post cryoablation of left renal mass on May 30, 2021. The patient is currently undergoing treatment with Keytruda  200 Mg IV every 3 weeks started July 27, 2021 and then switch it to 400 Mg every 6 weeks on November 09, 2021.  He completed his treatment in July 2024. He was found to have solitary right cerebellar brain metastasis in November 2024.    Metastatic Renal Cell Carcinoma Metastatic renal cell carcinoma previously treated  with Keytruda  and axitinib , discontinued due to immune therapy-mediated hepatitis. Completed two years of Keytruda  in July 2024. Solitary brain metastasis treated with surgery and SRS radiation in November 2024. Currently presenting with a lytic lesion in the upper sternum, suggestive of recent bone metastasis progression. Discussed treatment options including Opdivo  (nivolumab ) and Cabometyx  (cabozantinib ) with potential benefits and risks, including insurance issues and side effects. Explained need for dental clearance before starting Xgeva to prevent osteonecrosis of the jaw. - Order PET scan to evaluate disease extent - Refer to radiation oncology for sternum radiation therapy - Initiate Opdivo  and Cabometyx  in two weeks - Coordinate with pharmacist for Cabometyx  procurement - Advise dental clearance before starting Xgeva - Provide information about Xgeva for dental clearance - Advise avoiding strenuous upper body activities until treatment initiation  Bone Metastasis Lytic lesion in the upper sternum causing localized pain and tenderness, indicating structural weakness. Discussed radiation therapy to target the tumor and potential use of bone-strengthening agents like Xgeva. 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 No recurrence of hepatitis post-treatment. - Monitor liver function tests regularly during Opdivo  and Cabometyx  treatment  General Health Maintenance Weight gain of 10-12 pounds in the last month, likely due to decreased physical activity. - Encourage regular physical activity such as walking or using a stationary bike - Advise against strenuous upper body exercises until bone metastasis is treated  Follow-up - Schedule  follow-up appointment in two weeks to start Opdivo  and Cabometyx  - Coordinate with pharmacist for medication procurement - Ensure dental clearance before starting Xgeva - Follow-up with radiation oncology for sternum radiation therapy.   The patient was advised to call immediately if he has any concerning symptoms in the interval. The patient voices understanding of current disease status and treatment options and is in agreement with the current care plan.  All questions were answered. The patient knows to call the clinic with any problems, questions or concerns. We can certainly see the patient much sooner if necessary.  The total time spent in the appointment was 30 minutes.  Disclaimer: This note was dictated with voice recognition software. Similar sounding words can inadvertently be transcribed and may not be corrected upon review.

## 2023-12-16 ENCOUNTER — Telehealth: Payer: Self-pay | Admitting: Radiation Oncology

## 2023-12-16 ENCOUNTER — Other Ambulatory Visit (HOSPITAL_COMMUNITY): Payer: Self-pay

## 2023-12-16 NOTE — Telephone Encounter (Signed)
2/11 @ 8:40 am Left voicemail on patient's contact number and spoke to patient's wife.  Patient currently out of town and will be back by next week.  Patient's wife would like to confirm 2/17 with patient and will call back.

## 2023-12-18 ENCOUNTER — Other Ambulatory Visit (HOSPITAL_COMMUNITY): Payer: Self-pay

## 2023-12-18 NOTE — Telephone Encounter (Signed)
Oral Oncology Patient Advocate Encounter  There is a copay card program available for Cabometyx. It requires confirmation of patient's email. I have left a vm with the patient's numbers.  Jinger Neighbors, CPhT-Adv Oncology Pharmacy Patient Advocate [facility] Direct Number: [phone]  Fax: [phone]'

## 2023-12-18 NOTE — Telephone Encounter (Signed)
Oral Oncology Patient Advocate Encounter  Prior Authorization for Cabometyx has been approved.    PA# [id number] Effective dates: 11/18/23 through 12/17/24  Patients co-pay is $3,291.13.   Patient is required to fill a 15 day supply for the first 4 fills.    Jinger Neighbors, CPhT-Adv Oncology Pharmacy Patient Advocate [facility] Direct Number: [phone]  Fax: [phone]

## 2023-12-18 NOTE — Progress Notes (Signed)
 Histology and Location of Primary Cancer: Stage IV clear-cell renal cell carcinoma with mets to other sites.  Sites of Visceral and Bony Metastatic Disease: Upper Sternum  Location(s) of Symptomatic Metastases: Upper Sternum  Past/Anticipated chemotherapy by medical oncology, if any:  Dr. Arbutus Ped   Pain on a scale of 0-10 is:  2/10 left shoulder   If Spine Met(s), symptoms, if any, include: Bowel/Bladder retention or incontinence (please describe):  No Numbness or weakness in extremities (please describe): No Current Decadron regimen, if applicable:   Ambulatory status? Walker? Wheelchair?: Independent  SAFETY ISSUES: Prior radiation?  Yes,10/2023 Pacemaker/ICD? no Possible current pregnancy? Male Is the patient on methotrexate? No  Current Complaints / other details:

## 2023-12-18 NOTE — Telephone Encounter (Signed)
Oral Oncology Patient Advocate Encounter   Submitted prior authorization forms for Cabometyx to [employer].   Application submitted via e-fax to [phone]   [employer] phone number [phone].   I will continue to check the status until final determination.   Jinger Neighbors, CPhT-Adv Oncology Pharmacy Patient Advocate [facility] Direct Number: [phone]  Fax: [phone]

## 2023-12-19 ENCOUNTER — Encounter: Payer: Self-pay | Admitting: Internal Medicine

## 2023-12-19 ENCOUNTER — Other Ambulatory Visit (HOSPITAL_COMMUNITY): Payer: Self-pay

## 2023-12-19 ENCOUNTER — Telehealth: Payer: Self-pay | Admitting: Pharmacy Technician

## 2023-12-19 NOTE — Telephone Encounter (Signed)
Oral Oncology Patient Advocate Encounter   Was successful in obtaining a copay card for Cabometyx.  This copay card will make the patients copay $0.  I have spoken with the patient.    The billing information is as follows and has been shared with [facility].   RxBin: [id number] PCN: Loyalty Member ID: [id number] Group ID: [id number]   Jinger Neighbors, CPhT-Adv Oncology Pharmacy Patient Advocate [facility] Direct Number: [phone]  Fax: [phone]

## 2023-12-22 ENCOUNTER — Ambulatory Visit
Admission: RE | Admit: 2023-12-22 | Discharge: 2023-12-22 | Disposition: A | Discharge status: Home / Self Care | Payer: BC Managed Care – PPO | Source: Ambulatory Visit | Attending: Radiation Oncology | Admitting: Radiation Oncology

## 2023-12-22 ENCOUNTER — Encounter: Payer: Self-pay | Admitting: Physician Assistant

## 2023-12-22 ENCOUNTER — Ambulatory Visit: Payer: BC Managed Care – PPO | Admitting: Radiation Oncology

## 2023-12-22 ENCOUNTER — Telehealth: Payer: Self-pay | Admitting: Medical Oncology

## 2023-12-22 VITALS — BP 148/90 | HR 72 | Temp 97.8°F | Resp 20 | Ht 66.0 in | Wt 264.4 lb

## 2023-12-22 DIAGNOSIS — K754 Autoimmune hepatitis: Secondary | ICD-10-CM | POA: Insufficient documentation

## 2023-12-22 DIAGNOSIS — I10 Essential (primary) hypertension: Secondary | ICD-10-CM | POA: Insufficient documentation

## 2023-12-22 DIAGNOSIS — C7951 Secondary malignant neoplasm of bone: Secondary | ICD-10-CM | POA: Insufficient documentation

## 2023-12-22 DIAGNOSIS — E039 Hypothyroidism, unspecified: Secondary | ICD-10-CM | POA: Diagnosis not present

## 2023-12-22 DIAGNOSIS — C641 Malignant neoplasm of right kidney, except renal pelvis: Secondary | ICD-10-CM | POA: Diagnosis not present

## 2023-12-22 DIAGNOSIS — C649 Malignant neoplasm of unspecified kidney, except renal pelvis: Secondary | ICD-10-CM

## 2023-12-22 DIAGNOSIS — C7931 Secondary malignant neoplasm of brain: Secondary | ICD-10-CM | POA: Diagnosis not present

## 2023-12-22 DIAGNOSIS — Z905 Acquired absence of kidney: Secondary | ICD-10-CM | POA: Insufficient documentation

## 2023-12-22 DIAGNOSIS — Z79899 Other long term (current) drug therapy: Secondary | ICD-10-CM | POA: Diagnosis not present

## 2023-12-22 DIAGNOSIS — Z79890 Hormone replacement therapy (postmenopausal): Secondary | ICD-10-CM | POA: Insufficient documentation

## 2023-12-22 DIAGNOSIS — G4730 Sleep apnea, unspecified: Secondary | ICD-10-CM | POA: Diagnosis not present

## 2023-12-22 NOTE — Telephone Encounter (Signed)
 Wife asking if PET scan is necessary?  Can Michael [name] just get scans?  She was told it does not give a accurate description of RCC.   He is scheduled for PET 02/24 and CT scan later.

## 2023-12-22 NOTE — Progress Notes (Signed)
 Radiation Oncology         [phone]) [phone] ________________________________  Re-Consultation  Name: Michael [name] MRN: [id number]  Date of Service: 12/22/2023 DOB: [birth year]  WU:[id number], Michael Ruiz, MD  Si Gaul, MD   REFERRING PHYSICIAN: Si Gaul, MD  DIAGNOSIS: 58 y.o. man with a solitary sternal osseous metastasis secondary to Stage IV clear cell renal cell carcinoma    ICD-10-CM   1. Secondary malignant neoplasm of sternum (HCC)  C79.51     2. Metastasis to bone (HCC)  C79.51     3. Primary malignant neoplasm of kidney with metastasis from kidney to other site, unspecified laterality (HCC)  C64.9        HISTORY OF PRESENT ILLNESS: Michael [name] is a 58 y.o. male seen at the request of Dr. Arbutus Ped. He has a history of clear-cell renal cell carcinoma, initially diagnosed in 2020. He was found to have locally advanced disease (pT2aNx) at the time of the right nephrectomy on 08/27/19 in [place]. He was found to have pulmonary metastases with a 1.2 cm LLL nodule and multiple small pulmonary nodules with associated lymphadenopathy on follow up surveillance imaging in 2021 and confirmed on bronch/EBUS at that time. He was subsequently treated with Keytruda and axitinib, started 01/28/20. This treatment was interrupted due to autoimmune hepatitis after only 2 cycles.  He relocated to [facility] in July 2021 and established care with Dr. Clelia Croft.  They attempted to resume Axitinib on 07/05/20, but this was discontinued after only 10 days of treatment, due to hepatotoxicity with elevated LFTs. He developed an enhancing left renal mass on imaging in June 2022 that was subsequently treated with percutaneous cryoablation x2 (staged treatment) in July 2022. He resumed his single agent Keytruda immunotherapy on 07/27/21, and completed 2 years of treatment with his last dose on 06/03/23. He was then placed on observation only, under the care of Dr. Shirline Frees. He was unfortunately found to have a  solitary brain metastasis on head CT from 09/30/23 due to increasing headaches. We met the patient on 10/07/23 and he was subsequently treated with a single fraction of preoperative SRS on 10/10/23, followed by craniotomy on 10/13/23 under the care of Dr. Conchita Paris.  Most recently, he noticed a growing mass on his anterior chest for at least the last month. Of note, this was not noted on previous chest CT from 09/30/23 although, in retrospect, it was present on CT Chest imaging from 05/2023 and 09/2023. He had a recent chest soft tissue US on 12/10/23 showing a 2.9 cm heterogeneous lesion with internal vascularity within the right supraclavicular region of the neck. Additional imaging with soft tissue neck CT on 12/12/23 confirmed an aggressive, 6.7 cm lytic lesion involving the anterior right 1st rib and right side of the sternum, likely representing a focal metastasis. Additionally, there was an 18.5 mm right posterior triangle node concerning for metastatic disease but no other focal enlarged nodes in the cervical chains and no other bony lesions within the chest. He was seen by Dr. Arbutus Ped on 12/15/23 to discuss the new findings and is scheduled for further staging work up with PET scan on 12/25/23. He is also scheduled to initiate new systemic therapy with Opdivo and Cabometyx on 12/30/23.     He has kindly been referred back to [place] today to discuss the potential role for focused radiotherapy in the management of oligometastatic disease.  PREVIOUS RADIATION THERAPY: No  PAST MEDICAL HISTORY:  Past Medical History:  Diagnosis Date   Cancer (HCC)  Renal cell carcinoma   Clear cell renal cell carcinoma (HCC)    Hypertension    Hypothyroidism    Sleep apnea    wears cpap      PAST SURGICAL HISTORY: Past Surgical History:  Procedure Laterality Date   APPLICATION OF CRANIAL NAVIGATION N/A 10/13/2023   Procedure: APPLICATION OF CRANIAL NAVIGATION;  Surgeon: Lisbeth Renshaw, MD;  Location: [facility] OR;   Service: Neurosurgery;  Laterality: N/A;   CRANIOTOMY N/A 10/13/2023   Procedure: SUBOCCIPITAL   CRANIOTOMY TUMOR EXCISION;  Surgeon: Lisbeth Renshaw, MD;  Location: [facility] OR;  Service: Neurosurgery;  Laterality: N/A;   IR RADIOLOGIST EVAL & MGMT  04/04/2021   no procedures performed   IR RADIOLOGIST EVAL & MGMT  05/08/2021   no procedures performed   IR RADIOLOGIST EVAL & MGMT  08/12/2022   LUNG BIOPSY  2021   RADIOLOGY WITH ANESTHESIA N/A 05/30/2021   Procedure: CT WITH ANESTHESIA CRYOABLATION;  Surgeon: Irish Lack, MD;  Location: [facility] ORS;  Service: Radiology;  Laterality: N/A;   right nephrectomy Right 08/2019   TONSILLECTOMY      FAMILY HISTORY: No family history on file.  SOCIAL HISTORY:  Social History   Socioeconomic History   Marital status: Married    Spouse name: Not on file   Number of children: Not on file   Years of education: Not on file   Highest education level: Not on file  Occupational History   Not on file  Tobacco Use   Smoking status: Never   Smokeless tobacco: Never  Vaping Use   Vaping status: Never Used  Substance and Sexual Activity   Alcohol use: Not Currently    Comment: occasional beer or wine 1 x a month   Drug use: Never   Sexual activity: Yes    Birth control/protection: None  Other Topics Concern   Not on file  Social History Narrative   Not on file   Social Drivers of Health   Financial Resource Strain: Low Risk  (08/29/2019)   Received from [facility], [facility]   Overall Financial Resource Strain (CARDIA)    Difficulty of Paying Living Expenses: Not hard at all  Food Insecurity: No Food Insecurity (10/13/2023)   Hunger Vital Sign    Worried About Running Out of Food in the Last Year: Never true    Ran Out of Food in the Last Year: Never true  Transportation Needs: No Transportation Needs (10/13/2023)   PRAPARE - Administrator, Civil Service (Medical): No    Lack of Transportation (Non-Medical):  No  Physical Activity: Insufficiently Active (08/29/2019)   Received from [facility], [facility]   Exercise Vital Sign    Days of Exercise per Week: 7 days    Minutes of Exercise per Session: 20 min  Stress: No Stress Concern Present (08/29/2019)   Received from [facility]   [employer] of Occupational Health - Occupational Stress Questionnaire    Feeling of Stress : Not at all  Social Connections: Unknown (05/11/2022)   Received from [facility], [facility]   Social Network    Social Network: Not on file  Intimate Partner Violence: Not At Risk (10/13/2023)   Humiliation, Afraid, Rape, and Kick questionnaire    Fear of Current or Ex-Partner: No    Emotionally Abused: No    Physically Abused: No    Sexually Abused: No    ALLERGIES: Cleocin [clindamycin], Penicillins, and Tape  MEDICATIONS:  Current Outpatient Medications  Medication Sig Dispense Refill   fluconazole (DIFLUCAN) 150 MG tablet Take 150 mg by mouth daily.     nystatin (MYCOSTATIN) 100000 UNIT/ML suspension Take by mouth.     amLODipine (NORVASC) 5 MG tablet Take 7.5 mg by mouth daily before breakfast.     cabozantinib (CABOMETYX) 40 MG tablet Take 1 tablet (40 mg total) by mouth daily. Take on an empty stomach, 1 hour before or 2 hours after meals. 30 tablet 3   levothyroxine (SYNTHROID) 137 MCG tablet TAKE 1 TABLET(137 MCG) BY MOUTH DAILY BEFORE BREAKFAST 30 tablet 2   LORazepam (ATIVAN) 0.5 MG tablet 1 tablet p.o. 30 minutes before the PET scan repeat x 1 if needed. 2 tablet 0   methocarbamol (ROBAXIN) 500 MG tablet Take 500 mg by mouth every 8 (eight) hours as needed for muscle spasms.     montelukast (SINGULAIR) 10 MG tablet Take 10 mg by mouth daily before breakfast.     ondansetron (ZOFRAN) 8 MG tablet Take 1 tablet (8 mg total) by mouth every 8 (eight) hours as needed for nausea or vomiting. 30 tablet 1   prochlorperazine (COMPAZINE) 10 MG tablet Take 1  tablet (10 mg total) by mouth every 6 (six) hours as needed for nausea or vomiting. 30 tablet 1   tadalafil (CIALIS) 20 MG tablet Take 10-20 mg by mouth daily as needed for erectile dysfunction.     No current facility-administered medications for this encounter.    REVIEW OF SYSTEMS:  On review of systems, the patient reports that he is doing well overall.  He reports that the enlarging mass in the right chest wall is tender but he is more bothered by musculoskeletal pain in the left shoulder following a recent tennis match.  He denies any chest pain, shortness of breath, cough, fevers, chills, night sweats, or recent unintended weight changes. He denies any bowel or bladder disturbances, and denies abdominal pain, nausea or vomiting. He denies any new musculoskeletal or joint aches or pains.  A complete review of systems is obtained and is otherwise negative.    PHYSICAL EXAM:  Wt Readings from Last 3 Encounters:  12/22/23 264 lb 6.4 oz (119.9 kg)  12/15/23 266 lb 4.8 oz (120.8 kg)  10/13/23 255 lb (115.7 kg)   Temp Readings from Last 3 Encounters:  12/22/23 97.8 F (36.6 C)  12/15/23 98.4 F (36.9 C) (Temporal)  10/15/23 98.2 F (36.8 C) (Oral)   BP Readings from Last 3 Encounters:  12/22/23 (!) 148/90  12/15/23 139/89  10/15/23 132/84   Pulse Readings from Last 3 Encounters:  12/22/23 72  12/15/23 65  10/15/23 (!) 59   Pain Assessment Pain Score: 2  Pain Loc: Shoulder (left)/10  In general this is a well appearing Caucasian man in no acute distress. He's alert and oriented x4 and appropriate throughout the examination. Cardiopulmonary assessment is negative for acute distress and he exhibits normal effort.     KPS = 100  100 - Normal; no complaints; no evidence of disease. 90   - Able to carry on normal activity; minor signs or symptoms of disease. 80   - Normal activity with effort; some signs or symptoms of disease. 40   - Cares for self; unable to carry on normal  activity or to do active work. 60   - Requires occasional assistance, but is able to care for most of his personal needs. 50   - Requires considerable assistance and frequent medical care. 40   -  Disabled; requires special care and assistance. 30   - Severely disabled; hospital admission is indicated although death not imminent. 20   - Very sick; hospital admission necessary; active supportive treatment necessary. 10   - Moribund; fatal processes progressing rapidly. 0     - Dead  Karnofsky DA, Abelmann WH, Craver LS and [facility] [phone]) The use of the nitrogen mustards in the palliative treatment of carcinoma: with particular reference to bronchogenic carcinoma Cancer 1 634-56  LABORATORY DATA:  Lab Results  Component Value Date   WBC 5.7 12/15/2023   HGB 12.1 (L) 12/15/2023   HCT 37.8 (L) 12/15/2023   MCV 81.3 12/15/2023   PLT 279 12/15/2023   Lab Results  Component Value Date   NA 140 12/15/2023   K 4.2 12/15/2023   CL 108 12/15/2023   CO2 26 12/15/2023   Lab Results  Component Value Date   ALT 22 12/15/2023   AST 21 12/15/2023   ALKPHOS 85 12/15/2023   BILITOT 0.4 12/15/2023     RADIOGRAPHY: CT SOFT TISSUE NECK W CONTRAST Result Date: 12/12/2023 CLINICAL DATA:  Soft tissue mass identified on chest ultrasound. EXAM: CT NECK WITH CONTRAST TECHNIQUE: Multidetector CT imaging of the neck was performed using the standard protocol following the bolus administration of intravenous contrast. RADIATION DOSE REDUCTION: This exam was performed according to the departmental dose-optimization program which includes automated exposure control, adjustment of the mA and/or kV according to patient size and/or use of iterative reconstruction technique. CONTRAST:  75mL OMNIPAQUE IOHEXOL 300 MG/ML  SOLN COMPARISON:  Soft tissue ultrasound 12/10/2023. FINDINGS: Pharynx and larynx: No focal mucosal or submucosal lesions are present. The nasopharynx is clear. The soft palate and tongue base are  within normal limits. Vallecula and epiglottis are within normal limits. Aryepiglottic folds and piriform sinuses are clear. Insert no cords Salivary glands: The submandibular and parotid glands and ducts are within normal limits. Thyroid: Normal Lymph nodes: A right posterior triangle node measures 18.5 mm. No other focal enlarged nodes are present in the cervical chains. Vascular: Minimal calcifications are present at the carotid bifurcations bilaterally. No focal stenosis is present. Limited intracranial: Right suboccipital craniotomy is noted. The visualized intracranial contents are normal. Visualized orbits: The globes and orbits are within normal limits. Mastoids and visualized paranasal sinuses: The paranasal sinuses and mastoid air cells are clear. Skeleton: The vertebral body heights are normal. Slight reversal of the normal cervical lordosis is present. An aggressive lytic lesion involving the anterior right first rib and right side of the sternum has progressed from the earlier studies, now measuring 6.7 x 3.2 x 3.2 cm. No other focal osseous lesions are present. Upper chest: The lung apices are clear. IMPRESSION: 1. Aggressive lytic lesion involving the anterior right first rib and right side of the sternum has progressed from the earlier studies, now measuring 6.7 x 3.2 x 3.2 cm. This likely represents a focal metastasis in this patient with a history of metastatic renal cell carcinoma. 2. 18.5 mm right posterior triangle node is concerning for metastatic disease. 3. No other focal enlarged nodes in the cervical chains. 4. Right suboccipital craniotomy. Electronically Signed   By: Marin Roberts M.D.   On: 12/12/2023 13:35   [place] CHEST SOFT TISSUE Result Date: 12/10/2023 : PROCEDURE: US SOFT TISSUE HISTORY: Patient is a 58 y/o M with right medial supraclavicular cystic like mass. COMPARISON: None available TECHNIQUE: Two-dimensional grayscale and color Doppler ultrasound of the right  supraclavicular region was performed. FINDINGS: There  is a heterogeneous lesion with internal vascularity measuring 2.9 x 2.2 x 1.5 cm identified within the area of patient's palpable lump in the right supraclavicular region of the neck. IMPRESSION: 1. Heterogeneous lesion with internal vascularity measuring 2.9 cm within the right supraclavicular region of the neck. Recommend contrast-enhanced CT or tissue sampling for further evaluation. Thank you for allowing [place] to assist in the care of this patient. Electronically Signed   By: Lestine Box M.D.   On: 12/10/2023 13:45      IMPRESSION/PLAN: 1. 58 y.o. man with a solitary sternal osseous metastasis secondary to Stage IV clear cell renal cell carcinoma  Today, we talked to the patient and his wife about the findings and workup thus far. We discussed the natural history of metastatic cancer and general treatment, highlighting the role of radiotherapy in the management of painful osseous metastasis. We discussed the available radiation techniques, and focused on the details and logistics of delivery.  Given the fact that he has a solitary osseous lesion and it is a renal cell metastasis, the recommendation is for a 5 fraction course of stereotactic body radiotherapy (SBRT).  We reviewed the anticipated acute and late sequelae associated with radiation in this setting. The patient was encouraged to ask questions that were answered to his stated satisfaction.  At the end of our discussion, the patient is in agreement to proceed with the recommended 5 fraction course of stereotactic body radiotherapy (SBRT) to the solitary metastasis in the right chest wall/sternum.  He has freely signed written consent to proceed and a copy of this document will be placed in his medical record.  He appears to have a good understanding of his disease and our treatment recommendations which are of curative intent.  He is scheduled for his PET for disease restaging on 12/25/2023 and  is tentatively scheduled for CT simulation/treatment planning at 8 AM on Friday, 12/26/2023, in anticipation of beginning his treatments in the near future, so we will share our discussion with Dr. Arbutus Ped and proceed with treatment planning accordingly.  We enjoyed meeting with him and his wife again today and look forward to continuing to participate in his care.  We personally spent 60 minutes in this encounter including chart review, reviewing radiological studies, meeting face-to-face with the patient, entering orders and completing documentation.    Marguarite Arbour, PA-C    Margaretmary Dys, MD  [facility]  Radiation Oncology Direct Dial: [phone]  Fax: [phone] [email or web address]  Skype  [email or web address]   This document serves as a record of services personally performed by Margaretmary Dys, MD and Marcello Fennel, PA-C. It was created on their behalf by Mickie Bail, a trained medical scribe. The creation of this record is based on the scribe's personal observations and the provider's statements to them. This document has been checked and approved by the attending provider.

## 2023-12-23 ENCOUNTER — Other Ambulatory Visit: Payer: Self-pay | Admitting: Internal Medicine

## 2023-12-23 ENCOUNTER — Other Ambulatory Visit (HOSPITAL_COMMUNITY): Payer: Self-pay

## 2023-12-23 ENCOUNTER — Other Ambulatory Visit: Payer: Self-pay | Admitting: Pharmacy Technician

## 2023-12-23 ENCOUNTER — Other Ambulatory Visit: Payer: Self-pay | Admitting: Medical Oncology

## 2023-12-23 ENCOUNTER — Other Ambulatory Visit: Payer: Self-pay

## 2023-12-23 NOTE — Progress Notes (Signed)
 Oral Chemotherapy Pharmacist Encounter  Patient was counseled under telephone encounter from 12/15/23.  Sherry Ruffing, PharmD, BCPS, BCOP Hematology/Oncology Clinical Pharmacist [facility] Oral Chemotherapy Navigation Clinics [phone] 12/23/2023 10:46 AM

## 2023-12-23 NOTE — Progress Notes (Signed)
 Pharmacist Chemotherapy Monitoring - Initial Assessment    Anticipated start date: 12/30/23   The following has been reviewed per standard work regarding the patient's treatment regimen: The patient's diagnosis, treatment plan and drug doses, and organ/hematologic function Lab orders and baseline tests specific to treatment regimen  The treatment plan start date, drug sequencing, and pre-medications Prior authorization status  Patient's documented medication list, including drug-drug interaction screen and prescriptions for anti-emetics and supportive care specific to the treatment regimen The drug concentrations, fluid compatibility, administration routes, and timing of the medications to be used The patient's access for treatment and lifetime cumulative dose history, if applicable  The patient's medication allergies and previous infusion related reactions, if applicable   Changes made to treatment plan:  N/A  Follow up needed:  N/A   Janeice Robinson, RPH, 12/23/2023  10:31 AM

## 2023-12-23 NOTE — Telephone Encounter (Signed)
 Oral Chemotherapy Pharmacist Encounter  I spoke with patient for overview of: Cabometyx (cabozantinib) for the treatment of metastatic clear-cell renal cell carcinoma in conjunction with nivolumab, planned duration until disease progression or unacceptable drug toxicity.   Counseled patient on administration, dosing, side effects, monitoring, drug-food interactions, safe handling, storage, and disposal.  Patient will take Cabometyx 40mg  tablets, 1 tablet (40mg ) by mouth once daily on an empty stomach, 1 hour before or 2 hours after a meal.  Patient knows to avoid grapefruit and grapefruit juice.  Cabometyx start date: pending - patient knows not to start until instructed by MD after OV on 12/30/23  Adverse effects include but are not limited to: diarrhea, nausea, decreased appetite, fatigue, hypertension, hand-foot syndrome, decreased blood counts, and electrolyte abnormalities. Patient will obtain anti diarrheal and alert the office of 4 or more loose stools above baseline.  Patient informed that Cabometyx should be held at least 3 weeks prior to any scheduled surgery (including dental surgery) and not resumed until at least 2 weeks after major surgery and until adequate wound healing is established.  Reviewed with patient importance of keeping a medication schedule and plan for any missed doses. No barriers to medication adherence identified.  Medication reconciliation performed and medication/allergy list updated.  All questions answered.  Mr. Blades voiced understanding and appreciation.   Medication education handout will be given to patient in clinic on 12/30/23. Patient knows to call the office with questions or concerns. Oral Chemotherapy Clinic phone number provided to patient.   Sherry Ruffing, PharmD, BCPS, BCOP Hematology/Oncology Clinical Pharmacist [facility] Oral Chemotherapy Navigation Clinics [phone] 12/23/2023 10:44 AM

## 2023-12-23 NOTE — Progress Notes (Signed)
 Specialty Pharmacy Initial Fill Coordination Note  Michael [name] is a 58 y.o. male contacted today regarding refills of specialty medication(s) Cabozantinib S-Malate (Cabometyx) .  Patient requested Delivery  on 12/26/23  to verified address [address]   [place] [address]-   Medication will be filled on 12/24/23.   Patient is aware of $0 copayment.

## 2023-12-25 ENCOUNTER — Ambulatory Visit (HOSPITAL_COMMUNITY)
Admission: RE | Admit: 2023-12-25 | Discharge: 2023-12-25 | Disposition: A | Discharge status: Home / Self Care | Payer: BC Managed Care – PPO | Source: Ambulatory Visit | Attending: Internal Medicine | Admitting: Internal Medicine

## 2023-12-25 ENCOUNTER — Other Ambulatory Visit: Payer: Self-pay

## 2023-12-25 ENCOUNTER — Encounter (HOSPITAL_COMMUNITY): Payer: BC Managed Care – PPO

## 2023-12-25 ENCOUNTER — Encounter (HOSPITAL_COMMUNITY): Payer: Self-pay

## 2023-12-25 DIAGNOSIS — C7951 Secondary malignant neoplasm of bone: Secondary | ICD-10-CM | POA: Diagnosis not present

## 2023-12-25 DIAGNOSIS — N200 Calculus of kidney: Secondary | ICD-10-CM | POA: Diagnosis not present

## 2023-12-25 DIAGNOSIS — C7931 Secondary malignant neoplasm of brain: Secondary | ICD-10-CM | POA: Diagnosis not present

## 2023-12-25 DIAGNOSIS — C649 Malignant neoplasm of unspecified kidney, except renal pelvis: Secondary | ICD-10-CM | POA: Insufficient documentation

## 2023-12-25 DIAGNOSIS — K7689 Other specified diseases of liver: Secondary | ICD-10-CM | POA: Diagnosis not present

## 2023-12-25 NOTE — Progress Notes (Signed)
 Cabometyx did not come in order due to weather. Will attempt to ship Friday 2/21 as long as inventory obtained in time. Patient aware of delay.

## 2023-12-26 ENCOUNTER — Other Ambulatory Visit: Payer: Self-pay

## 2023-12-26 ENCOUNTER — Ambulatory Visit
Admission: RE | Admit: 2023-12-26 | Discharge: 2023-12-26 | Disposition: A | Discharge status: Home / Self Care | Payer: BC Managed Care – PPO | Source: Ambulatory Visit | Attending: Radiation Oncology | Admitting: Radiation Oncology

## 2023-12-26 ENCOUNTER — Ambulatory Visit: Payer: BC Managed Care – PPO | Admitting: Radiation Oncology

## 2023-12-26 DIAGNOSIS — C649 Malignant neoplasm of unspecified kidney, except renal pelvis: Secondary | ICD-10-CM | POA: Diagnosis not present

## 2023-12-26 DIAGNOSIS — C7951 Secondary malignant neoplasm of bone: Secondary | ICD-10-CM | POA: Insufficient documentation

## 2023-12-28 DIAGNOSIS — G4733 Obstructive sleep apnea (adult) (pediatric): Secondary | ICD-10-CM | POA: Diagnosis not present

## 2023-12-28 NOTE — Progress Notes (Signed)
  Radiation Oncology         [phone] ________________________________  Name: Michael [name] MRN: [id number]  Date: 12/26/2023  DOB: 10/24/1966  STEREOTACTIC BODY RADIOTHERAPY SIMULATION AND TREATMENT PLANNING NOTE    ICD-10-CM   1. Metastasis to bone [facility])  C79.51       DIAGNOSIS:   58 y.o. man with a solitary sternal osseous metastasis secondary to Stage IV clear cell renal cell carcinoma  NARRATIVE:  The patient was brought to the CT Simulation planning suite.  Identity was confirmed.  All relevant records and images related to the planned course of therapy were reviewed.  The patient freely provided informed written consent to proceed with treatment after reviewing the details related to the planned course of therapy. The consent form was witnessed and verified by the simulation staff.  Then, the patient was set-up in a stable reproducible  supine position for radiation therapy.  A BodyFix immobilization pillow was fabricated for reproducible positioning.   4D respiratoy motion management CT images were obtained.  Surface markings were placed.  The CT images were loaded into the planning software.  Then, using Cine, MIP, and standard views, the internal target volume (ITV) and planning target volumes (PTV) were delinieated, and avoidance structures were contoured.  Treatment planning then occurred.  The radiation prescription was entered and confirmed.  A total of two complex treatment devices were fabricated in the form of the BodyFix immobilization pillow and a neck accuform cushion.  I have requested : 3D Simulation  I have requested a DVH of the following structures: Heart, Lungs, Esophagus, Chest Wall, Brachial Plexus, Major Blood Vessels, and targets.  SPECIAL TREATMENT PROCEDURE:  The planned course of therapy using radiation constitutes a special treatment procedure. Special care is required in the management of this patient for the following reasons. This treatment constitutes a  Special Treatment Procedure for the following reason: [ High dose per fraction requiring special monitoring for increased toxicities of treatment including daily imaging..  The special nature of the planned course of radiotherapy will require increased physician supervision and oversight to ensure patient's safety with optimal treatment outcomes.  This requires extended time and effort.    RESPIRATORY MOTION MANAGEMENT SIMULATION:  In order to account for effect of respiratory motion on target structures and other organs in the planning and delivery of radiotherapy, this patient underwent respiratory motion management simulation.  To accomplish this, when the patient was brought to the CT simulation planning suite, 4D respiratoy motion management CT images were obtained.  The CT images were loaded into the planning software.  Then, using a variety of tools including Cine, MIP, and standard views, the target volume and planning target volumes (PTV) were delineated.  Avoidance structures were contoured.  Treatment planning then occurred.  Dose volume histograms were generated and reviewed for each of the requested structure.  The resulting plan was carefully reviewed and approved today.  PLAN:  The patient will receive 50 Gy in 5 fractions.  ________________________________  Artist Pais Kathrynn Running, M.D.

## 2023-12-28 NOTE — Progress Notes (Unsigned)
 [facility] OFFICE PROGRESS NOTE  Michael Corn, MD [address] [place] [address]  DIAGNOSIS: Stage IV clear-cell renal cell carcinoma initially diagnosed as locally advanced disease in 2020 with evidence of pulmonary metastasis in 2021.   PRIOR THERAPY: 1) status post right nephrectomy on August 27, 2019 in [place] and the final pathology showed clear-cell renal cell carcinoma nuclear grade 3 with 10% necrosis and negative margin with the final pathologic stage of T2a, NX. 2) status post treatment with Keytruda and axitinib started March 2021 for 2 cycles interrupted because of autoimmune hepatitis.  3) axitinib 5 mg daily started July 05, 2020 discontinued after 1 week because of elevated LFTs. 4) status post cryoablation of left renal mass on May 30, 2021. 5) Keytruda 200 Mg IV every 3 weeks started July 27, 2021 and then switch it to 400 Mg every 6 weeks on November 09, 2021.  Last dose was given July 2024. 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 Conchita Paris  CURRENT THERAPY:  1) Opdivo 480 mg IV every 4 weeks and Cabometyx 40 mg p.o. daily first dose on 12/30/23 2) SBRT to the right chest wall/sternum under the care of Dr. Kathrynn Running starting next week. Last dose on 01/16/24 3) Xgeva once the patient obtains dental clearance    INTERVAL HISTORY: Michael [name] 58 y.o. male returns to the clinic today for a follow-up visit accompanied by his wife.  The patient was last seen in clinic on 12/15/2023.  The patient unfortunately was recently diagnosed with progressive metastatic renal cell carcinoma.  Dr. Arbutus Ped is planning on starting him on IV immunotherapy with nivolumab with oral Cabometyx for which he received and will start today. He also met with Dr. Kathrynn Running who is planning on SBRT to the right chest wall/sternal lesion.  Dr. Arbutus Ped was also  suggesting Rivka Barbara but the patient requires dental clearance for which he had a dental exam and some fillings performed yesterday. The patient is asking for a reminder what medication he is needing to get clearance for from his dentist.   At his last appointment with Dr. Arbutus Ped, Dr. Arbutus Ped ordered a PET scan to assess for metastatic disease. In retrospect, the sternal lesion was present on imaging from July 2024 and November 2024 and the patient and his wife are concerned about metastatic disease in the bone being missed on CT scan. However, this was denied by insurance until a CT scan could be performed.   Since last being seen, the patient was playing tennis and mentions some increased left lower back pain. He also had some left scapular pain which has since resolved. They want to review the images of his recent CT scan to ensure there is no metastatic disease to this area. The patient has been taking tylenol which helps.   Otherwise the patient denies any changes in his health since he was last seen.  He denies any fever, chills, night sweats, unexplained weight loss.  He reports weight gain. He denies any unusual shortness of breath, cough, or hemoptysis. He denies any nausea, vomiting, diarrhea, or const patient. After his craniotomy, he started getting a rash/pimples on his back. The only new products he could associate with this was using Dove soap, which he has since discontinued but the rash has persisted. Denies any headache or visual changes. After hospital after surgery. He is here today for evaluation  repeat blood work before undergoing cycle #1 of nivolumab   MEDICAL HISTORY: Past Medical History:  Diagnosis Date   Cancer (HCC)    Renal cell carcinoma   Clear cell renal cell carcinoma (HCC)    Hypertension    Hypothyroidism    Sleep apnea    wears cpap    ALLERGIES:  is allergic to cleocin [clindamycin], penicillins, and tape.  MEDICATIONS:  Current Outpatient Medications   Medication Sig Dispense Refill   doxycycline (VIBRA-TABS) 100 MG tablet Take 1 tablet (100 mg total) by mouth 2 (two) times daily. 20 tablet 0   amLODipine (NORVASC) 5 MG tablet Take 7.5 mg by mouth daily before breakfast.     cabozantinib (CABOMETYX) 40 MG tablet Take 1 tablet (40 mg total) by mouth daily. Take on an empty stomach, 1 hour before or 2 hours after meals. 30 tablet 3   fluconazole (DIFLUCAN) 150 MG tablet Take 150 mg by mouth daily.     levothyroxine (SYNTHROID) 137 MCG tablet TAKE 1 TABLET(137 MCG) BY MOUTH DAILY BEFORE BREAKFAST 30 tablet 2   LORazepam (ATIVAN) 0.5 MG tablet 1 tablet p.o. 30 minutes before the PET scan repeat x 1 if needed. 2 tablet 0   methocarbamol (ROBAXIN) 500 MG tablet Take 500 mg by mouth every 8 (eight) hours as needed for muscle spasms.     montelukast (SINGULAIR) 10 MG tablet Take 10 mg by mouth daily before breakfast.     nystatin (MYCOSTATIN) 100000 UNIT/ML suspension Take by mouth.     ondansetron (ZOFRAN) 8 MG tablet Take 1 tablet (8 mg total) by mouth every 8 (eight) hours as needed for nausea or vomiting. 30 tablet 1   prochlorperazine (COMPAZINE) 10 MG tablet Take 1 tablet (10 mg total) by mouth every 6 (six) hours as needed for nausea or vomiting. 30 tablet 1   tadalafil (CIALIS) 20 MG tablet Take 10-20 mg by mouth daily as needed for erectile dysfunction.     No current facility-administered medications for this visit.   Facility-Administered Medications Ordered in Other Visits  Medication Dose Route Frequency Provider Last Rate Last Admin   0.9 %  sodium chloride infusion   Intravenous Continuous Si Gaul, MD 10 mL/hr at 12/30/23 1227 New Bag at 12/30/23 1227   nivolumab (OPDIVO) 480 mg in sodium chloride 0.9 % 100 mL chemo infusion  480 mg Intravenous Once Si Gaul, MD        SURGICAL HISTORY:  Past Surgical History:  Procedure Laterality Date   APPLICATION OF CRANIAL NAVIGATION N/A 10/13/2023   Procedure: APPLICATION OF  CRANIAL NAVIGATION;  Surgeon: Lisbeth Renshaw, MD;  Location: [facility] OR;  Service: Neurosurgery;  Laterality: N/A;   CRANIOTOMY N/A 10/13/2023   Procedure: SUBOCCIPITAL   CRANIOTOMY TUMOR EXCISION;  Surgeon: Lisbeth Renshaw, MD;  Location: [facility] OR;  Service: Neurosurgery;  Laterality: N/A;   IR RADIOLOGIST EVAL & MGMT  04/04/2021   no procedures performed   IR RADIOLOGIST EVAL & MGMT  05/08/2021   no procedures performed   IR RADIOLOGIST EVAL & MGMT  08/12/2022   LUNG BIOPSY  2021   RADIOLOGY WITH ANESTHESIA N/A 05/30/2021   Procedure: CT WITH ANESTHESIA CRYOABLATION;  Surgeon: Irish Lack, MD;  Location: [facility] ORS;  Service: Radiology;  Laterality: N/A;   right nephrectomy Right 08/2019   TONSILLECTOMY      REVIEW OF SYSTEMS:   Review of Systems  Constitutional: Negative for appetite change, chills, fatigue, fever and unexpected weight change.  HENT: Negative for  mouth sores, nosebleeds, sore throat and trouble swallowing.   Eyes: Negative for eye problems and icterus.  Respiratory: Negative for cough, hemoptysis, shortness of breath and wheezing.   Cardiovascular: Negative for chest pain and leg swelling.  Gastrointestinal: Negative for abdominal pain, constipation, diarrhea, nausea and vomiting.  Genitourinary: Negative for bladder incontinence, difficulty urinating, dysuria, frequency and hematuria.   Musculoskeletal: Positive left lower back pain and improved shoulder pain. Negative for back pain, gait problem, neck pain and neck stiffness.  Skin: Positive for rash/skin lesions on back Neurological: Negative for dizziness, extremity weakness, gait problem, headaches, light-headedness and seizures.  Hematological: Negative for adenopathy. Does not bruise/bleed easily.  Psychiatric/Behavioral: Negative for confusion, depression and sleep disturbance. The patient is not nervous/anxious.     PHYSICAL EXAMINATION:  Blood pressure (!) 139/93, pulse 67, temperature 97.9 F (36.6 C),  temperature source Temporal, resp. rate 13, weight 262 lb 12.8 oz (119.2 kg), SpO2 100%.  ECOG PERFORMANCE STATUS: 1  Physical Exam  Constitutional: Oriented to person, place, and time and well-developed, well-nourished, and in no distress.  HENT:  Head: Normocephalic and atraumatic.  Mouth/Throat: Oropharynx is clear and moist. No oropharyngeal exudate.  Eyes: Conjunctivae are normal. Right eye exhibits no discharge. Left eye exhibits no discharge. No scleral icterus.  Neck: Normal range of motion. Neck supple.  Cardiovascular: Normal rate, regular rhythm, normal heart sounds and intact distal pulses.   Pulmonary/Chest: Effort normal and breath sounds normal. No respiratory distress. No wheezes. No rales.  Abdominal: Soft. Bowel sounds are normal. Exhibits no distension and no mass. There is no tenderness.  Musculoskeletal: No tenderness to palpation of shoulder or musculature of back. Sternal bone lesion visible to visible inspection. Normal range of motion. Exhibits no edema.  Lymphadenopathy:    No cervical adenopathy.  Neurological: Alert and oriented to person, place, and time. Exhibits normal muscle tone. Gait normal. Coordination normal.  Skin: Skin is warm and dry. Rash/pimples on back. Not diaphoretic. No erythema. No pallor.  Psychiatric: Mood, memory and judgment normal.  Vitals reviewed.  LABORATORY DATA: Lab Results  Component Value Date   WBC 5.9 12/30/2023   HGB 13.1 12/30/2023   HCT 41.3 12/30/2023   MCV 81.5 12/30/2023   PLT 285 12/30/2023      Chemistry      Component Value Date/Time   NA 141 12/30/2023 1057   K 4.1 12/30/2023 1057   CL 107 12/30/2023 1057   CO2 28 12/30/2023 1057   BUN 19 12/30/2023 1057   CREATININE 1.13 12/30/2023 1057      Component Value Date/Time   CALCIUM 9.3 12/30/2023 1057   ALKPHOS 93 12/30/2023 1057   AST 20 12/30/2023 1057   ALT 20 12/30/2023 1057   BILITOT 0.4 12/30/2023 1057       RADIOGRAPHIC STUDIES:  CT Chest  Wo Contrast Result Date: 12/29/2023 CLINICAL DATA:  Metastatic renal cell carcinoma. * Tracking Code: BO * EXAM: CT CHEST, ABDOMEN AND PELVIS WITHOUT CONTRAST TECHNIQUE: Multidetector CT imaging of the chest, abdomen and pelvis was performed following the standard protocol without IV contrast. RADIATION DOSE REDUCTION: This exam was performed according to the departmental dose-optimization program which includes automated exposure control, adjustment of the mA and/or kV according to patient size and/or use of iterative reconstruction technique. COMPARISON:  09/30/2023. FINDINGS: CT CHEST FINDINGS Cardiovascular: Atherosclerotic calcification of the aorta. Heart is at the upper limits of normal in size to mildly enlarged. No pericardial effusion. Mediastinum/Nodes: No pathologically enlarged mediastinal or axillary lymph  nodes. Hilar regions are difficult to definitively evaluate without IV contrast. Esophagus is grossly unremarkable. Lungs/Pleura: A few basilar pulmonary nodules measure up to 4 mm in the right lower lobe, stable. No pleural fluid. Airway is unremarkable. Musculoskeletal: Lytic metastases at the junction of the right first rib and manubrium, 4.6 x 7.0 cm (2/29), and in the T9 vertebral body, 2.4 x 2.8 cm (2/99). CT ABDOMEN PELVIS FINDINGS Hepatobiliary: Hepatic cysts. Liver and gallbladder are otherwise unremarkable. No biliary ductal dilatation. Pancreas: Negative. Spleen: Negative. Adrenals/Urinary Tract: Adrenal glands are unremarkable. Right nephrectomy. Centrally fat density lesion along the lateral interpolar left kidney measures 1.7 cm, stable, indicative of previous cryoablation. Tiny left renal stone. Left ureter is decompressed. Bladder is low in volume. Stomach/Bowel: Stomach, small bowel, appendix and colon are unremarkable. Vascular/Lymphatic: Vascular structures are unremarkable. No pathologically enlarged lymph nodes. Periportal lymph nodes measure up to 12 mm, unchanged.  Reproductive: Prostate is visualized. Other: No free fluid. Small right lateral pelvic wall hernia contains fat. Mesenteries and peritoneum are unremarkable. Musculoskeletal: Degenerative changes in the spine. No additional lytic lesions. IMPRESSION: 1. Metastatic disease involving the junction of the right first rib and manubrium as well as T9 vertebral body. 2. Tiny basilar pulmonary nodules, similar.  Tiny left renal stone. 3.  Aortic atherosclerosis (ICD10-I70.0). Electronically Signed   By: Leanna Battles M.D.   On: 12/29/2023 14:56   CT ABDOMEN PELVIS WO CONTRAST Result Date: 12/29/2023 CLINICAL DATA:  Metastatic renal cell carcinoma. * Tracking Code: BO * EXAM: CT CHEST, ABDOMEN AND PELVIS WITHOUT CONTRAST TECHNIQUE: Multidetector CT imaging of the chest, abdomen and pelvis was performed following the standard protocol without IV contrast. RADIATION DOSE REDUCTION: This exam was performed according to the departmental dose-optimization program which includes automated exposure control, adjustment of the mA and/or kV according to patient size and/or use of iterative reconstruction technique. COMPARISON:  09/30/2023. FINDINGS: CT CHEST FINDINGS Cardiovascular: Atherosclerotic calcification of the aorta. Heart is at the upper limits of normal in size to mildly enlarged. No pericardial effusion. Mediastinum/Nodes: No pathologically enlarged mediastinal or axillary lymph nodes. Hilar regions are difficult to definitively evaluate without IV contrast. Esophagus is grossly unremarkable. Lungs/Pleura: A few basilar pulmonary nodules measure up to 4 mm in the right lower lobe, stable. No pleural fluid. Airway is unremarkable. Musculoskeletal: Lytic metastases at the junction of the right first rib and manubrium, 4.6 x 7.0 cm (2/29), and in the T9 vertebral body, 2.4 x 2.8 cm (2/99). CT ABDOMEN PELVIS FINDINGS Hepatobiliary: Hepatic cysts. Liver and gallbladder are otherwise unremarkable. No biliary ductal  dilatation. Pancreas: Negative. Spleen: Negative. Adrenals/Urinary Tract: Adrenal glands are unremarkable. Right nephrectomy. Centrally fat density lesion along the lateral interpolar left kidney measures 1.7 cm, stable, indicative of previous cryoablation. Tiny left renal stone. Left ureter is decompressed. Bladder is low in volume. Stomach/Bowel: Stomach, small bowel, appendix and colon are unremarkable. Vascular/Lymphatic: Vascular structures are unremarkable. No pathologically enlarged lymph nodes. Periportal lymph nodes measure up to 12 mm, unchanged. Reproductive: Prostate is visualized. Other: No free fluid. Small right lateral pelvic wall hernia contains fat. Mesenteries and peritoneum are unremarkable. Musculoskeletal: Degenerative changes in the spine. No additional lytic lesions. IMPRESSION: 1. Metastatic disease involving the junction of the right first rib and manubrium as well as T9 vertebral body. 2. Tiny basilar pulmonary nodules, similar.  Tiny left renal stone. 3.  Aortic atherosclerosis (ICD10-I70.0). Electronically Signed   By: Leanna Battles M.D.   On: 12/29/2023 14:56   CT SOFT TISSUE NECK  W CONTRAST Result Date: 12/12/2023 CLINICAL DATA:  Soft tissue mass identified on chest ultrasound. EXAM: CT NECK WITH CONTRAST TECHNIQUE: Multidetector CT imaging of the neck was performed using the standard protocol following the bolus administration of intravenous contrast. RADIATION DOSE REDUCTION: This exam was performed according to the departmental dose-optimization program which includes automated exposure control, adjustment of the mA and/or kV according to patient size and/or use of iterative reconstruction technique. CONTRAST:  75mL OMNIPAQUE IOHEXOL 300 MG/ML  SOLN COMPARISON:  Soft tissue ultrasound 12/10/2023. FINDINGS: Pharynx and larynx: No focal mucosal or submucosal lesions are present. The nasopharynx is clear. The soft palate and tongue base are within normal limits. Vallecula and  epiglottis are within normal limits. Aryepiglottic folds and piriform sinuses are clear. Insert no cords Salivary glands: The submandibular and parotid glands and ducts are within normal limits. Thyroid: Normal Lymph nodes: A right posterior triangle node measures 18.5 mm. No other focal enlarged nodes are present in the cervical chains. Vascular: Minimal calcifications are present at the carotid bifurcations bilaterally. No focal stenosis is present. Limited intracranial: Right suboccipital craniotomy is noted. The visualized intracranial contents are normal. Visualized orbits: The globes and orbits are within normal limits. Mastoids and visualized paranasal sinuses: The paranasal sinuses and mastoid air cells are clear. Skeleton: The vertebral body heights are normal. Slight reversal of the normal cervical lordosis is present. An aggressive lytic lesion involving the anterior right first rib and right side of the sternum has progressed from the earlier studies, now measuring 6.7 x 3.2 x 3.2 cm. No other focal osseous lesions are present. Upper chest: The lung apices are clear. IMPRESSION: 1. Aggressive lytic lesion involving the anterior right first rib and right side of the sternum has progressed from the earlier studies, now measuring 6.7 x 3.2 x 3.2 cm. This likely represents a focal metastasis in this patient with a history of metastatic renal cell carcinoma. 2. 18.5 mm right posterior triangle node is concerning for metastatic disease. 3. No other focal enlarged nodes in the cervical chains. 4. Right suboccipital craniotomy. Electronically Signed   By: Marin Roberts M.D.   On: 12/12/2023 13:35   [place] CHEST SOFT TISSUE Result Date: 12/10/2023 : PROCEDURE: US SOFT TISSUE HISTORY: Patient is a 58 y/o M with right medial supraclavicular cystic like mass. COMPARISON: None available TECHNIQUE: Two-dimensional grayscale and color Doppler ultrasound of the right supraclavicular region was performed. FINDINGS:  There is a heterogeneous lesion with internal vascularity measuring 2.9 x 2.2 x 1.5 cm identified within the area of patient's palpable lump in the right supraclavicular region of the neck. IMPRESSION: 1. Heterogeneous lesion with internal vascularity measuring 2.9 cm within the right supraclavicular region of the neck. Recommend contrast-enhanced CT or tissue sampling for further evaluation. Thank you for allowing [place] to assist in the care of this patient. Electronically Signed   By: Lestine Box M.D.   On: 12/10/2023 13:45     ASSESSMENT/PLAN:  This is a very pleasant 58 year old male with stage IV clear-cell renal cell carcinoma. This was initially diagnosed with locally advanced disease in 2020. He had evidence of pulmonary metastases in 2021. He is status post right nephrectomy on 10/23/202020 in [place]. The final pathology showed clear-cell renal cell carcinoma nuclear grade 3 with 10% necrosis and negative margins. The final pathologic stage was T2a, NX.   The patient started treatment with Keytruda and Axitinib that was started in March 2021.  He had 2 cycles of treatment but it was interrupted  due to autoimmune hepatitis.  He then started on 5 mg daily of axitinib on 07/05/2020 but this was also discontinued after 1 week due to elevated LFTs.   He underwent cryoablation to the left renal mass on 05/30/2021.   He then underwent treatment with Keytruda.  He was initially on 200 mg IV every 3 weeks on 07/27/2021 but then was switched to 400 mg every 6 weeks starting from 11/09/2021.  He was found to have a solitary right cerebellar brain lesion in November 2024 and underwent SRS on 10/10/2023 followed by craniotomy on 10/13/2023.  He was recently found to have disease progression and February 2024 with a lytic lesion in the upper sternum.  The patient is planning to to this lesion under the care of Dr. Kathrynn Running.  The last day radiation is scheduled for 01/16/24  The patient is here today to start  treatment with IV nivolumab 480 mg IV every 4 weeks and oral treatment with Cabometyx 40 mg p.o. daily.   The patient was seen with Dr. Arbutus Ped today.  Her Mohamed personally and reviewed the image of his recent CT scan from last week.  The scan also showed a T9 lesion.  The patient is not having any pain but is wondering if this would be targeted with palliative radiation.  I will route a copy of the finalized CT report to Dr. Kathrynn Running.  Labs were reviewed.  Recommend he proceed with cycle #1 today scheduled.  We will see him back for follow-up visit in 2 weeks for evaluation and repeat blood work. We then will see him in 4 weeks from now as well before undergoing cycle #2.  Of course if he has any adverse side effects from cabometyx, he was advised to call sooner for evaluation.   I wrote for him the name of [facility] Barbara to share with his dentist and our fax number and asked that they fax a dental clearance form  Dr. Arbutus Ped discussed that diarrhea is a common side effects of cabometyx and to keep imodium on hand.   He has a rash on his back that looks most consistent with cystic acne.  Therefore I will I will send a prescription for doxycycline to the pharmacy as the area is too large for topical therapy.   We will also reorder the PET scan to ensure there is no small metastatic bone lesions given his history of these not being easily visualized on his restaging CT scans.  The patient was advised to call immediately if he has any concerning symptoms in the interval. The patient voices understanding of current disease status and treatment options and is in agreement with the current care plan. All questions were answered. The patient knows to call the clinic with any problems, questions or concerns. We can certainly see the patient much sooner if necessary   Orders Placed This Encounter  Procedures   NM PET Image Restage (PS) Skull Base to Thigh (F-18 FDG)    Standing Status:   Future    Expected  Date:   01/06/2024    Expiration Date:   12/29/2024    If indicated for the ordered procedure, I authorize the administration of a radiopharmaceutical per Radiology protocol:   Yes    Preferred imaging location?:   Cecile Sheerer Annina Piotrowski, PA-C 12/30/23  ADDENDUM: Hematology/Oncology Attending:  I had a face-to-face encounter with the patient today.  I reviewed his record, lab, scan and recommended his care  plan.  This is a very pleasant 58 years old white male with stage IV clear-cell renal cell carcinoma that was initially diagnosed as locally advanced disease in 2020 with evidence of pulmonary metastasis in 2021.  The patient completed a course of treatment with Keytruda in July 2024 and has been on observation.  Unfortunately in November 2024 he was found to have solitary brain metastasis treated with SRS followed by craniotomy and surgical resection in December 2024.  Recent imaging studies showed an aggressive appearing lytic lesion involving the anterior right first rib and right side of the sternum progressed from earlier studies with a right posterior triangle lymph node concerning for metastatic disease.  The patient had CT scan of the chest, abdomen and pelvis without contrast performed last week and that showed metastatic disease involving the junction of the right first rib and manubrium as well as T9 vertebral body with similar tiny basilar pulmonary nodules. He was supposed to have a PET scan for further evaluation of his disease but it was not covered by his insurance initially because of lack of CT imaging. I recommended for the patient to have a PET scan for further evaluation and to rule out any other metastatic disease especially in the bone that could be missed with the CT scan. He will start palliative radiotherapy to the right first rib and manubrium as well as T9 vertebral body under the care of Dr. Kathrynn Running. The patient will start systemic therapy with  nivolumab 480 Mg IV every 4 weeks in addition to Cabometyx 40 mg p.o. daily.  First dose today. We will see him back for follow-up visit in 2 weeks for evaluation and management of any adverse effect of his treatment. The patient was advised to call immediately if he has any other concerning symptoms in the interval. The total time spent in the appointment was 30 minutes. Disclaimer: This note was dictated with voice recognition software. Similar sounding words can inadvertently be transcribed and may be missed upon review. Lajuana Matte, MD

## 2023-12-29 ENCOUNTER — Ambulatory Visit (HOSPITAL_COMMUNITY): Payer: BC Managed Care – PPO

## 2023-12-29 ENCOUNTER — Other Ambulatory Visit: Payer: BC Managed Care – PPO

## 2023-12-29 ENCOUNTER — Encounter: Payer: Self-pay | Admitting: Internal Medicine

## 2023-12-30 ENCOUNTER — Inpatient Hospital Stay: Payer: BC Managed Care – PPO

## 2023-12-30 ENCOUNTER — Inpatient Hospital Stay (HOSPITAL_BASED_OUTPATIENT_CLINIC_OR_DEPARTMENT_OTHER): Payer: BC Managed Care – PPO | Admitting: Physician Assistant

## 2023-12-30 ENCOUNTER — Other Ambulatory Visit: Payer: Self-pay

## 2023-12-30 VITALS — BP 139/93 | HR 67 | Temp 97.9°F | Resp 13 | Wt 262.8 lb

## 2023-12-30 DIAGNOSIS — C649 Malignant neoplasm of unspecified kidney, except renal pelvis: Secondary | ICD-10-CM

## 2023-12-30 DIAGNOSIS — C7931 Secondary malignant neoplasm of brain: Secondary | ICD-10-CM | POA: Diagnosis not present

## 2023-12-30 DIAGNOSIS — C642 Malignant neoplasm of left kidney, except renal pelvis: Secondary | ICD-10-CM | POA: Diagnosis not present

## 2023-12-30 DIAGNOSIS — R21 Rash and other nonspecific skin eruption: Secondary | ICD-10-CM | POA: Diagnosis not present

## 2023-12-30 DIAGNOSIS — K754 Autoimmune hepatitis: Secondary | ICD-10-CM | POA: Diagnosis not present

## 2023-12-30 DIAGNOSIS — Z79620 Long term (current) use of immunosuppressive biologic: Secondary | ICD-10-CM | POA: Diagnosis not present

## 2023-12-30 DIAGNOSIS — K7689 Other specified diseases of liver: Secondary | ICD-10-CM | POA: Diagnosis not present

## 2023-12-30 DIAGNOSIS — Z79899 Other long term (current) drug therapy: Secondary | ICD-10-CM | POA: Diagnosis not present

## 2023-12-30 DIAGNOSIS — Z9226 Personal history of immune checkpoint inhibitor therapy: Secondary | ICD-10-CM | POA: Diagnosis not present

## 2023-12-30 DIAGNOSIS — Z881 Allergy status to other antibiotic agents status: Secondary | ICD-10-CM | POA: Diagnosis not present

## 2023-12-30 DIAGNOSIS — C7800 Secondary malignant neoplasm of unspecified lung: Secondary | ICD-10-CM | POA: Diagnosis not present

## 2023-12-30 DIAGNOSIS — M545 Low back pain, unspecified: Secondary | ICD-10-CM | POA: Diagnosis not present

## 2023-12-30 DIAGNOSIS — Z9889 Other specified postprocedural states: Secondary | ICD-10-CM | POA: Diagnosis not present

## 2023-12-30 DIAGNOSIS — Z5112 Encounter for antineoplastic immunotherapy: Secondary | ICD-10-CM

## 2023-12-30 DIAGNOSIS — Z905 Acquired absence of kidney: Secondary | ICD-10-CM | POA: Diagnosis not present

## 2023-12-30 DIAGNOSIS — Z79890 Hormone replacement therapy (postmenopausal): Secondary | ICD-10-CM | POA: Diagnosis not present

## 2023-12-30 DIAGNOSIS — I700 Atherosclerosis of aorta: Secondary | ICD-10-CM | POA: Diagnosis not present

## 2023-12-30 DIAGNOSIS — Z880 Allergy status to penicillin: Secondary | ICD-10-CM | POA: Diagnosis not present

## 2023-12-30 DIAGNOSIS — C7951 Secondary malignant neoplasm of bone: Secondary | ICD-10-CM | POA: Diagnosis not present

## 2023-12-30 LAB — CBC WITH DIFFERENTIAL (CANCER CENTER ONLY)
Abs Immature Granulocytes: 0.02 10*3/uL (ref 0.00–0.07)
Basophils Absolute: 0 10*3/uL (ref 0.0–0.1)
Basophils Relative: 1 %
Eosinophils Absolute: 0.3 10*3/uL (ref 0.0–0.5)
Eosinophils Relative: 5 %
HCT: 41.3 % (ref 39.0–52.0)
Hemoglobin: 13.1 g/dL (ref 13.0–17.0)
Immature Granulocytes: 0 %
Lymphocytes Relative: 27 %
Lymphs Abs: 1.6 10*3/uL (ref 0.7–4.0)
MCH: 25.8 pg — ABNORMAL LOW (ref 26.0–34.0)
MCHC: 31.7 g/dL (ref 30.0–36.0)
MCV: 81.5 fL (ref 80.0–100.0)
Monocytes Absolute: 0.5 10*3/uL (ref 0.1–1.0)
Monocytes Relative: 8 %
Neutro Abs: 3.5 10*3/uL (ref 1.7–7.7)
Neutrophils Relative %: 59 %
Platelet Count: 285 10*3/uL (ref 150–400)
RBC: 5.07 MIL/uL (ref 4.22–5.81)
RDW: 14.8 % (ref 11.5–15.5)
WBC Count: 5.9 10*3/uL (ref 4.0–10.5)
nRBC: 0 % (ref 0.0–0.2)

## 2023-12-30 LAB — CMP (CANCER CENTER ONLY)
ALT: 20 U/L (ref 0–44)
AST: 20 U/L (ref 15–41)
Albumin: 4.4 g/dL (ref 3.5–5.0)
Alkaline Phosphatase: 93 U/L (ref 38–126)
Anion gap: 6 (ref 5–15)
BUN: 19 mg/dL (ref 6–20)
CO2: 28 mmol/L (ref 22–32)
Calcium: 9.3 mg/dL (ref 8.9–10.3)
Chloride: 107 mmol/L (ref 98–111)
Creatinine: 1.13 mg/dL (ref 0.61–1.24)
GFR, Estimated: >60 mL/min (ref >60)
Glucose, Bld: 113 mg/dL — ABNORMAL HIGH (ref 70–99)
Potassium: 4.1 mmol/L (ref 3.5–5.1)
Sodium: 141 mmol/L (ref 135–145)
Total Bilirubin: 0.4 mg/dL (ref 0.0–1.2)
Total Protein: 7.5 g/dL (ref 6.5–8.1)

## 2023-12-30 LAB — TSH: TSH: 2.919 u[IU]/mL (ref 0.350–4.500)

## 2023-12-30 MED ORDER — SODIUM CHLORIDE 0.9 % IV SOLN: INTRAVENOUS | Status: DC

## 2023-12-30 MED ORDER — SODIUM CHLORIDE 0.9 % IV SOLN
480.0000 mg | Freq: Once | INTRAVENOUS | Status: AC
  Administered 2023-12-30: 480 mg via INTRAVENOUS
  Filled 2023-12-30: qty 48

## 2023-12-30 MED ORDER — DOXYCYCLINE HYCLATE 100 MG PO TABS: 100.0000 mg | ORAL_TABLET | Freq: Two times a day (BID) | ORAL | 0 refills | Status: DC

## 2023-12-30 NOTE — Patient Instructions (Signed)
 [facility] - A DEPT OF [facility]  Discharge Instructions: Thank you for choosing [facility] to provide your oncology and hematology care.   If you have a lab appointment with the Cancer Center, please go directly to the Cancer Center and check in at the registration area.   Wear comfortable clothing and clothing appropriate for easy access to any Portacath or PICC line.   We strive to give you quality time with your provider. You may need to reschedule your appointment if you arrive late (15 or more minutes).  Arriving late affects you and other patients whose appointments are after yours.  Also, if you miss three or more appointments without notifying the office, you may be dismissed from the clinic at the provider's discretion.      For prescription refill requests, have your pharmacy contact our office and allow 72 hours for refills to be completed.    Today you received the following chemotherapy and/or immunotherapy agents opdivo      To help prevent nausea and vomiting after your treatment, we encourage you to take your nausea medication as directed.  BELOW ARE SYMPTOMS THAT SHOULD BE REPORTED IMMEDIATELY: *FEVER GREATER THAN 100.4 F (38 C) OR HIGHER *CHILLS OR SWEATING *NAUSEA AND VOMITING THAT IS NOT CONTROLLED WITH YOUR NAUSEA MEDICATION *UNUSUAL SHORTNESS OF BREATH *UNUSUAL BRUISING OR BLEEDING *URINARY PROBLEMS (pain or burning when urinating, or frequent urination) *BOWEL PROBLEMS (unusual diarrhea, constipation, pain near the anus) TENDERNESS IN MOUTH AND THROAT WITH OR WITHOUT PRESENCE OF ULCERS (sore throat, sores in mouth, or a toothache) UNUSUAL RASH, SWELLING OR PAIN  UNUSUAL VAGINAL DISCHARGE OR ITCHING   Items with * indicate a potential emergency and should be followed up as soon as possible or go to the Emergency Department if any problems should occur.  Please show the CHEMOTHERAPY ALERT CARD or IMMUNOTHERAPY  ALERT CARD at check-in to the Emergency Department and triage nurse.  Should you have questions after your visit or need to cancel or reschedule your appointment, please contact [facility] - A DEPT OF [facility]  Dept: [phone]  and follow the prompts.  Office hours are 8:00 a.m. to 4:30 p.m. Monday - Friday. Please note that voicemails left after 4:00 p.m. may not be returned until the following business day.  We are closed weekends and major holidays. You have access to a nurse at all times for urgent questions. Please call the main number to the clinic Dept: [phone] and follow the prompts.   For any non-urgent questions, you may also contact your provider using MyChart. We now offer e-Visits for anyone 74 and older to request care online for non-urgent symptoms. For details visit [email or web address].   Also download the MyChart app! Go to the app store, search "MyChart", open the app, select [facility], and log in with your MyChart username and password.

## 2023-12-31 ENCOUNTER — Telehealth: Payer: Self-pay | Admitting: Internal Medicine

## 2023-12-31 ENCOUNTER — Ambulatory Visit: Payer: BC Managed Care – PPO | Admitting: Radiation Oncology

## 2023-12-31 DIAGNOSIS — C7951 Secondary malignant neoplasm of bone: Secondary | ICD-10-CM | POA: Diagnosis not present

## 2023-12-31 DIAGNOSIS — C649 Malignant neoplasm of unspecified kidney, except renal pelvis: Secondary | ICD-10-CM | POA: Diagnosis not present

## 2023-12-31 LAB — T4: T4, Total: 8.7 ug/dL (ref 4.5–12.0)

## 2023-12-31 NOTE — Telephone Encounter (Signed)
 Scheduled appointments per 2/25 LOS notes. Patient is aware of the made appointments.

## 2024-01-01 ENCOUNTER — Ambulatory Visit: Payer: BC Managed Care – PPO | Admitting: Radiation Oncology

## 2024-01-01 ENCOUNTER — Other Ambulatory Visit (HOSPITAL_COMMUNITY): Payer: Self-pay

## 2024-01-02 ENCOUNTER — Ambulatory Visit: Payer: BC Managed Care – PPO | Admitting: Radiation Oncology

## 2024-01-05 ENCOUNTER — Telehealth: Payer: Self-pay

## 2024-01-05 ENCOUNTER — Ambulatory Visit: Payer: BC Managed Care – PPO | Admitting: Radiation Oncology

## 2024-01-05 NOTE — Telephone Encounter (Signed)
 RN returned call to [name] asking about CT scan results showing new area T9 he wanted to make sure Dr. Kathrynn Running and team were aware.  RN expressed to [name] that Dr. Kathrynn Running is aware and that the Tspine has been added to treatment plan.  RN also explained to [name] that he will need to sign a new consent form since new area was added to his treatment plan.  He verbalized understanding and stated he will be in for appointment 01/06/2024 at 9:30 am will be in 15 minutes early.

## 2024-01-06 ENCOUNTER — Other Ambulatory Visit: Payer: Self-pay

## 2024-01-06 ENCOUNTER — Other Ambulatory Visit: Payer: Self-pay | Admitting: Medical Oncology

## 2024-01-06 ENCOUNTER — Telehealth: Payer: Self-pay

## 2024-01-06 ENCOUNTER — Ambulatory Visit
Admission: RE | Admit: 2024-01-06 | Discharge: 2024-01-06 | Disposition: A | Discharge status: Home / Self Care | Payer: BC Managed Care – PPO | Source: Ambulatory Visit | Attending: Radiation Oncology | Admitting: Radiation Oncology

## 2024-01-06 ENCOUNTER — Ambulatory Visit: Payer: BC Managed Care – PPO | Admitting: Radiation Oncology

## 2024-01-06 ENCOUNTER — Other Ambulatory Visit (HOSPITAL_COMMUNITY): Payer: Self-pay

## 2024-01-06 DIAGNOSIS — C649 Malignant neoplasm of unspecified kidney, except renal pelvis: Secondary | ICD-10-CM | POA: Insufficient documentation

## 2024-01-06 DIAGNOSIS — C7931 Secondary malignant neoplasm of brain: Secondary | ICD-10-CM

## 2024-01-06 DIAGNOSIS — C7951 Secondary malignant neoplasm of bone: Secondary | ICD-10-CM | POA: Diagnosis not present

## 2024-01-06 LAB — RAD ONC ARIA SESSION SUMMARY
Course Elapsed Days: 0
Plan Fractions Treated to Date: 1
Plan Fractions Treated to Date: 1
Plan Prescribed Dose Per Fraction: 10 Gy
Plan Prescribed Dose Per Fraction: 10 Gy
Plan Total Fractions Prescribed: 5
Plan Total Fractions Prescribed: 5
Plan Total Prescribed Dose: 50 Gy
Plan Total Prescribed Dose: 50 Gy
Reference Point Dosage Given to Date: 10 Gy
Reference Point Dosage Given to Date: 10 Gy
Reference Point Session Dosage Given: 10 Gy
Reference Point Session Dosage Given: 10 Gy
Session Number: 1

## 2024-01-06 MED ORDER — CABOMETYX 40 MG PO TABS: 40.0000 mg | ORAL_TABLET | Freq: Every day | ORAL | 3 refills | Status: DC

## 2024-01-06 NOTE — Progress Notes (Signed)
 Disenrolling patient from [facility]. Patient has insurance restriction with [employer].

## 2024-01-06 NOTE — Telephone Encounter (Signed)
 LVM with patient informing him that the PET scan was not approved by insurance and P2P will be done by a provider.  Asked for a return call with any questions, otherwise, once P2P is done, I will give patient a call.

## 2024-01-07 ENCOUNTER — Ambulatory Visit: Payer: BC Managed Care – PPO | Admitting: Radiation Oncology

## 2024-01-07 ENCOUNTER — Ambulatory Visit: Payer: BC Managed Care – PPO | Admitting: Physician Assistant

## 2024-01-08 ENCOUNTER — Other Ambulatory Visit: Payer: Self-pay

## 2024-01-08 ENCOUNTER — Ambulatory Visit
Admission: RE | Admit: 2024-01-08 | Discharge: 2024-01-08 | Disposition: A | Discharge status: Home / Self Care | Payer: BC Managed Care – PPO | Source: Ambulatory Visit | Attending: Radiation Oncology | Admitting: Radiation Oncology

## 2024-01-08 ENCOUNTER — Telehealth: Payer: Self-pay | Admitting: *Deleted

## 2024-01-08 DIAGNOSIS — C7951 Secondary malignant neoplasm of bone: Secondary | ICD-10-CM

## 2024-01-08 DIAGNOSIS — C649 Malignant neoplasm of unspecified kidney, except renal pelvis: Secondary | ICD-10-CM

## 2024-01-08 LAB — RAD ONC ARIA SESSION SUMMARY
Course Elapsed Days: 2
Plan Fractions Treated to Date: 2
Plan Fractions Treated to Date: 2
Plan Prescribed Dose Per Fraction: 10 Gy
Plan Prescribed Dose Per Fraction: 10 Gy
Plan Total Fractions Prescribed: 5
Plan Total Fractions Prescribed: 5
Plan Total Prescribed Dose: 50 Gy
Plan Total Prescribed Dose: 50 Gy
Reference Point Dosage Given to Date: 20 Gy
Reference Point Dosage Given to Date: 20 Gy
Reference Point Session Dosage Given: 10 Gy
Reference Point Session Dosage Given: 10 Gy
Session Number: 2

## 2024-01-08 NOTE — Telephone Encounter (Signed)
 Patient's wife left message. She described rash as across both shoulders and near clavicles. Patient has not completed doxycycline, has a few days left. Patient has developed back pain lower left lumbar area - PCP prescribed flexeril, but pain continues. Reported this to C. Heilingoetter, PA.  Contacted pt wife with Ms. Heilingoetter's response - finish doxycycline. If rash is no better, May need referral to dermatologist, either from her or from PCP. Contact PCP about continued back discomfort is flexeril has not helped. Ms. Messer states the rash she is calling about is not the rah seen in the appt and treated with doxycycline. That rash is receding and looks a little better. The rash she called about is pinkish and was just seen today for first time. Barely raised - more under the skin. Does not itch. Patient started [employer]om[employer] 9 days and Ms. Hurlbut wondered if rash related to new med.  Advised her that providers will get this message and asked her if they could take a photo of it and send via Mychart. She verbalized agreement.

## 2024-01-09 ENCOUNTER — Ambulatory Visit: Payer: BC Managed Care – PPO | Admitting: Radiation Oncology

## 2024-01-09 NOTE — Progress Notes (Signed)
 [facility] OFFICE PROGRESS NOTE  [name], MD [address] [place] [address]  DIAGNOSIS: Stage IV clear-cell renal cell carcinoma initially diagnosed as locally advanced disease in 2020 with evidence of pulmonary metastasis in 2021.   PRIOR THERAPY: 1) status post right nephrectomy on August 27, 2019 in [place] and the final pathology showed clear-cell renal cell carcinoma nuclear grade 3 with 10% necrosis and negative margin with the final pathologic stage of T2a, NX. 2) status post treatment with Keytruda and axitinib started March 2021 for 2 cycles interrupted because of autoimmune hepatitis.  3) axitinib 5 mg daily started July 05, 2020 discontinued after 1 week because of elevated LFTs. 4) status post cryoablation of left renal mass on May 30, 2021. 5) Keytruda 200 Mg IV every 3 weeks started July 27, 2021 and then switch it to 400 Mg every 6 weeks on November 09, 2021.  Last dose was given July 2024. 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 Conchita Paris  CURRENT THERAPY: 1) Opdivo 480 mg IV every 4 weeks and Cabometyx 40 mg p.o. daily first dose on 12/30/23. Status post 1 cycle of treatment.  2) SBRT to the right chest wall/sternum and T9 under the care of Dr. Kathrynn Running starting next week. Last dose on 01/16/24 3) Xgeva once the patient obtains dental clearance   INTERVAL HISTORY: Michael [name] 58 y.o. male returns to the clinic today for a follow-up visit.  The patient was last seen in the clinic by Dr. Arbutus Ped and myself on 12/30/2023.  The patient was recently found to have disease progression and he was started back on systemic treatment with cabometyx and nivolumab.  He is also undergoing SBRT to the right chest wall/sternal lesion and T9 under the care of Dr. Kathrynn Running.  We also recommended that the patient obtain dental clearance with Xgeva.   The patient reached out to his dentist who recommended having a crown and filling placed prior to [facility].  The patient is wondering if it is okay to schedule this.  At his last appointment, we ordered a PET scan to assess with restaging for metastatic disease due to concerns of prior metastatic bone lesions not being reported on prior CT imaging.  After a peer to peer yesterday, this has been approved and this is scheduled for next week on 01/22/2024.  Patient also saw his orthopedic provider for low back pain.  His symptoms are most consistent with sciatica but they will monitor for any concerns on the upcoming PET scan.  They recommended stretches for the patient.  The patient denies any pain.  Sternal lesion is getting smaller since undergoing radiation.  Otherwise the patient denies any recent fever, chills, night sweats, unexplained weight loss. Denies any unusual shortness of breath, cough, or hemoptysis.  Denies any nausea, vomiting, diarrhea, or constipation.  May have a mild headache every now and then.  He may take a Tylenol but overall he does not take Tylenol very often and believes it may have been approximately 10 days or so since taking Tylenol.  He had a rash at his last appointment on his back which is "lightening up".  The patient also tries to use lotion on his hands and feet to prevent adverse side effects from cabometyx. he is here today for evaluation repeat blood work.    MEDICAL HISTORY: Past Medical History:  Diagnosis Date  Cancer [facility])    Renal cell carcinoma   Clear cell renal cell carcinoma (HCC)    Hypertension    Hypothyroidism    Sleep apnea    wears cpap    ALLERGIES:  is allergic to cleocin [clindamycin], penicillins, and tape.  MEDICATIONS:  Current Outpatient Medications  Medication Sig Dispense Refill   amLODipine (NORVASC) 5 MG tablet Take 7.5 mg by mouth daily before breakfast.     cabozantinib (CABOMETYX) 40 MG tablet Take 1 tablet (40 mg total) by  mouth daily. Take on an empty stomach, 1 hour before or 2 hours after meals. 30 tablet 3   doxycycline (VIBRA-TABS) 100 MG tablet Take 1 tablet (100 mg total) by mouth 2 (two) times daily. 20 tablet 0   fluconazole (DIFLUCAN) 150 MG tablet Take 150 mg by mouth daily.     levothyroxine (SYNTHROID) 137 MCG tablet TAKE 1 TABLET(137 MCG) BY MOUTH DAILY BEFORE BREAKFAST 30 tablet 2   LORazepam (ATIVAN) 0.5 MG tablet 1 tablet p.o. 30 minutes before the PET scan repeat x 1 if needed. 2 tablet 0   methocarbamol (ROBAXIN) 500 MG tablet Take 500 mg by mouth every 8 (eight) hours as needed for muscle spasms.     montelukast (SINGULAIR) 10 MG tablet Take 10 mg by mouth daily before breakfast.     nystatin (MYCOSTATIN) 100000 UNIT/ML suspension Take by mouth.     ondansetron (ZOFRAN) 8 MG tablet Take 1 tablet (8 mg total) by mouth every 8 (eight) hours as needed for nausea or vomiting. 30 tablet 1   prochlorperazine (COMPAZINE) 10 MG tablet Take 1 tablet (10 mg total) by mouth every 6 (six) hours as needed for nausea or vomiting. 30 tablet 1   tadalafil (CIALIS) 20 MG tablet Take 10-20 mg by mouth daily as needed for erectile dysfunction.     No current facility-administered medications for this visit.    SURGICAL HISTORY:  Past Surgical History:  Procedure Laterality Date   APPLICATION OF CRANIAL NAVIGATION N/A 10/13/2023   Procedure: APPLICATION OF CRANIAL NAVIGATION;  Surgeon: Lisbeth Renshaw, MD;  Location: [facility] OR;  Service: Neurosurgery;  Laterality: N/A;   CRANIOTOMY N/A 10/13/2023   Procedure: SUBOCCIPITAL   CRANIOTOMY TUMOR EXCISION;  Surgeon: Lisbeth Renshaw, MD;  Location: [facility] OR;  Service: Neurosurgery;  Laterality: N/A;   IR RADIOLOGIST EVAL & MGMT  04/04/2021   no procedures performed   IR RADIOLOGIST EVAL & MGMT  05/08/2021   no procedures performed   IR RADIOLOGIST EVAL & MGMT  08/12/2022   LUNG BIOPSY  2021   RADIOLOGY WITH ANESTHESIA N/A 05/30/2021   Procedure: CT WITH ANESTHESIA  CRYOABLATION;  Surgeon: Irish Lack, MD;  Location: [facility] ORS;  Service: Radiology;  Laterality: N/A;   right nephrectomy Right 08/2019   TONSILLECTOMY      REVIEW OF SYSTEMS:   Review of Systems  Constitutional: Negative for appetite change, chills, fatigue, fever and unexpected weight change.  HENT: Negative for mouth sores, nosebleeds, sore throat and trouble swallowing.   Eyes: Negative for eye problems and icterus.  Respiratory: Negative for cough, hemoptysis, shortness of breath and wheezing.   Cardiovascular: Negative for chest pain and leg swelling.  Gastrointestinal: Negative for abdominal pain, constipation, diarrhea, nausea and vomiting.  Genitourinary: Negative for bladder incontinence, difficulty urinating, dysuria, frequency and hematuria.   Musculoskeletal: Positive left lower back pain.. Negative for back pain, gait problem, neck pain and neck stiffness.  Skin: Positive for rash/skin lesions on back that are improving.  Neurological: Negative for dizziness, extremity weakness, gait problem, headaches, light-headedness and seizures.  Hematological: Negative for adenopathy. Does not bruise/bleed easily.  Psychiatric/Behavioral: Negative for confusion, depression and sleep disturbance. The patient is not nervous/anxious.       PHYSICAL EXAMINATION:  There were no vitals taken for this visit.  ECOG PERFORMANCE STATUS: 1  Physical Exam  Constitutional: Oriented to person, place, and time and well-developed, well-nourished, and in no distress.  HENT:  Head: Normocephalic and atraumatic.  Mouth/Throat: Oropharynx is clear and moist. No oropharyngeal exudate.  Eyes: Conjunctivae are normal. Right eye exhibits no discharge. Left eye exhibits no discharge. No scleral icterus.  Neck: Normal range of motion. Neck supple.  Cardiovascular: Normal rate, regular rhythm, normal heart sounds and intact distal pulses.   Pulmonary/Chest: Effort normal and breath sounds normal. No  respiratory distress. No wheezes. No rales.  Abdominal: Soft. Bowel sounds are normal. Exhibits no distension and no mass. There is no tenderness.  Musculoskeletal: More pronounced right sternum compared to left with palpation. Normal range of motion. Exhibits no edema.  Lymphadenopathy:    No cervical adenopathy.  Neurological: Alert and oriented to person, place, and time. Exhibits normal muscle tone. Gait normal. Coordination normal.  Skin: Skin is warm and dry. Not diaphoretic. No erythema. No pallor.  Psychiatric: Mood, memory and judgment normal.  Vitals reviewed.  LABORATORY DATA: Lab Results  Component Value Date   WBC 5.9 12/30/2023   HGB 13.1 12/30/2023   HCT 41.3 12/30/2023   MCV 81.5 12/30/2023   PLT 285 12/30/2023      Chemistry      Component Value Date/Time   NA 141 12/30/2023 1057   K 4.1 12/30/2023 1057   CL 107 12/30/2023 1057   CO2 28 12/30/2023 1057   BUN 19 12/30/2023 1057   CREATININE 1.13 12/30/2023 1057      Component Value Date/Time   CALCIUM 9.3 12/30/2023 1057   ALKPHOS 93 12/30/2023 1057   AST 20 12/30/2023 1057   ALT 20 12/30/2023 1057   BILITOT 0.4 12/30/2023 1057       RADIOGRAPHIC STUDIES:  CT Chest Wo Contrast Result Date: 12/29/2023 CLINICAL DATA:  Metastatic renal cell carcinoma. * Tracking Code: BO * EXAM: CT CHEST, ABDOMEN AND PELVIS WITHOUT CONTRAST TECHNIQUE: Multidetector CT imaging of the chest, abdomen and pelvis was performed following the standard protocol without IV contrast. RADIATION DOSE REDUCTION: This exam was performed according to the departmental dose-optimization program which includes automated exposure control, adjustment of the mA and/or kV according to patient size and/or use of iterative reconstruction technique. COMPARISON:  09/30/2023. FINDINGS: CT CHEST FINDINGS Cardiovascular: Atherosclerotic calcification of the aorta. Heart is at the upper limits of normal in size to mildly enlarged. No pericardial effusion.  Mediastinum/Nodes: No pathologically enlarged mediastinal or axillary lymph nodes. Hilar regions are difficult to definitively evaluate without IV contrast. Esophagus is grossly unremarkable. Lungs/Pleura: A few basilar pulmonary nodules measure up to 4 mm in the right lower lobe, stable. No pleural fluid. Airway is unremarkable. Musculoskeletal: Lytic metastases at the junction of the right first rib and manubrium, 4.6 x 7.0 cm (2/29), and in the T9 vertebral body, 2.4 x 2.8 cm (2/99). CT ABDOMEN PELVIS FINDINGS Hepatobiliary: Hepatic cysts. Liver and gallbladder are otherwise unremarkable. No biliary ductal dilatation. Pancreas: Negative. Spleen: Negative. Adrenals/Urinary Tract: Adrenal glands are unremarkable. Right nephrectomy. Centrally fat density lesion along the lateral interpolar left kidney measures 1.7 cm, stable, indicative of previous cryoablation. Tiny left renal stone. Left  ureter is decompressed. Bladder is low in volume. Stomach/Bowel: Stomach, small bowel, appendix and colon are unremarkable. Vascular/Lymphatic: Vascular structures are unremarkable. No pathologically enlarged lymph nodes. Periportal lymph nodes measure up to 12 mm, unchanged. Reproductive: Prostate is visualized. Other: No free fluid. Small right lateral pelvic wall hernia contains fat. Mesenteries and peritoneum are unremarkable. Musculoskeletal: Degenerative changes in the spine. No additional lytic lesions. IMPRESSION: 1. Metastatic disease involving the junction of the right first rib and manubrium as well as T9 vertebral body. 2. Tiny basilar pulmonary nodules, similar.  Tiny left renal stone. 3.  Aortic atherosclerosis (ICD10-I70.0). Electronically Signed   By: Leanna Battles M.D.   On: 12/29/2023 14:56   CT ABDOMEN PELVIS WO CONTRAST Result Date: 12/29/2023 CLINICAL DATA:  Metastatic renal cell carcinoma. * Tracking Code: BO * EXAM: CT CHEST, ABDOMEN AND PELVIS WITHOUT CONTRAST TECHNIQUE: Multidetector CT imaging of the  chest, abdomen and pelvis was performed following the standard protocol without IV contrast. RADIATION DOSE REDUCTION: This exam was performed according to the departmental dose-optimization program which includes automated exposure control, adjustment of the mA and/or kV according to patient size and/or use of iterative reconstruction technique. COMPARISON:  09/30/2023. FINDINGS: CT CHEST FINDINGS Cardiovascular: Atherosclerotic calcification of the aorta. Heart is at the upper limits of normal in size to mildly enlarged. No pericardial effusion. Mediastinum/Nodes: No pathologically enlarged mediastinal or axillary lymph nodes. Hilar regions are difficult to definitively evaluate without IV contrast. Esophagus is grossly unremarkable. Lungs/Pleura: A few basilar pulmonary nodules measure up to 4 mm in the right lower lobe, stable. No pleural fluid. Airway is unremarkable. Musculoskeletal: Lytic metastases at the junction of the right first rib and manubrium, 4.6 x 7.0 cm (2/29), and in the T9 vertebral body, 2.4 x 2.8 cm (2/99). CT ABDOMEN PELVIS FINDINGS Hepatobiliary: Hepatic cysts. Liver and gallbladder are otherwise unremarkable. No biliary ductal dilatation. Pancreas: Negative. Spleen: Negative. Adrenals/Urinary Tract: Adrenal glands are unremarkable. Right nephrectomy. Centrally fat density lesion along the lateral interpolar left kidney measures 1.7 cm, stable, indicative of previous cryoablation. Tiny left renal stone. Left ureter is decompressed. Bladder is low in volume. Stomach/Bowel: Stomach, small bowel, appendix and colon are unremarkable. Vascular/Lymphatic: Vascular structures are unremarkable. No pathologically enlarged lymph nodes. Periportal lymph nodes measure up to 12 mm, unchanged. Reproductive: Prostate is visualized. Other: No free fluid. Small right lateral pelvic wall hernia contains fat. Mesenteries and peritoneum are unremarkable. Musculoskeletal: Degenerative changes in the spine. No  additional lytic lesions. IMPRESSION: 1. Metastatic disease involving the junction of the right first rib and manubrium as well as T9 vertebral body. 2. Tiny basilar pulmonary nodules, similar.  Tiny left renal stone. 3.  Aortic atherosclerosis (ICD10-I70.0). Electronically Signed   By: Leanna Battles M.D.   On: 12/29/2023 14:56   CT SOFT TISSUE NECK W CONTRAST Result Date: 12/12/2023 CLINICAL DATA:  Soft tissue mass identified on chest ultrasound. EXAM: CT NECK WITH CONTRAST TECHNIQUE: Multidetector CT imaging of the neck was performed using the standard protocol following the bolus administration of intravenous contrast. RADIATION DOSE REDUCTION: This exam was performed according to the departmental dose-optimization program which includes automated exposure control, adjustment of the mA and/or kV according to patient size and/or use of iterative reconstruction technique. CONTRAST:  75mL OMNIPAQUE IOHEXOL 300 MG/ML  SOLN COMPARISON:  Soft tissue ultrasound 12/10/2023. FINDINGS: Pharynx and larynx: No focal mucosal or submucosal lesions are present. The nasopharynx is clear. The soft palate and tongue base are within normal limits. Vallecula and epiglottis are  within normal limits. Aryepiglottic folds and piriform sinuses are clear. Insert no cords Salivary glands: The submandibular and parotid glands and ducts are within normal limits. Thyroid: Normal Lymph nodes: A right posterior triangle node measures 18.5 mm. No other focal enlarged nodes are present in the cervical chains. Vascular: Minimal calcifications are present at the carotid bifurcations bilaterally. No focal stenosis is present. Limited intracranial: Right suboccipital craniotomy is noted. The visualized intracranial contents are normal. Visualized orbits: The globes and orbits are within normal limits. Mastoids and visualized paranasal sinuses: The paranasal sinuses and mastoid air cells are clear. Skeleton: The vertebral body heights are normal.  Slight reversal of the normal cervical lordosis is present. An aggressive lytic lesion involving the anterior right first rib and right side of the sternum has progressed from the earlier studies, now measuring 6.7 x 3.2 x 3.2 cm. No other focal osseous lesions are present. Upper chest: The lung apices are clear. IMPRESSION: 1. Aggressive lytic lesion involving the anterior right first rib and right side of the sternum has progressed from the earlier studies, now measuring 6.7 x 3.2 x 3.2 cm. This likely represents a focal metastasis in this patient with a history of metastatic renal cell carcinoma. 2. 18.5 mm right posterior triangle node is concerning for metastatic disease. 3. No other focal enlarged nodes in the cervical chains. 4. Right suboccipital craniotomy. Electronically Signed   By: Marin Roberts M.D.   On: 12/12/2023 13:35     ASSESSMENT/PLAN:  This is a very pleasant 58 year old male with stage IV clear-cell renal cell carcinoma. This was initially diagnosed with locally advanced disease in 2020. He had evidence of pulmonary metastases in 2021. He is status post right nephrectomy on 10/23/202020 in [place]. The final pathology showed clear-cell renal cell carcinoma nuclear grade 3 with 10% necrosis and negative margins. The final pathologic stage was T2a, NX.    The patient started treatment with Keytruda and Axitinib that was started in March 2021.  He had 2 cycles of treatment but it was interrupted due to autoimmune hepatitis.  He then started on 5 mg daily of axitinib on 07/05/2020 but this was also discontinued after 1 week due to elevated LFTs.   He underwent cryoablation to the left renal mass on 05/30/2021.   He then underwent treatment with Keytruda.  He was initially on 200 mg IV every 3 weeks on 07/27/2021 but then was switched to 400 mg every 6 weeks starting from 11/09/2021.   He was found to have a solitary right cerebellar brain lesion in November 2024 and underwent SRS on  10/10/2023 followed by craniotomy on 10/13/2023.   He was recently found to have disease progression and February 2024 with a lytic lesion in the upper sternum.  The patient is planning to to this lesion under the care of Dr. Kathrynn Running as well as the T9 lesion.  The last day radiation is scheduled for 01/16/24  The patient was started back on treatment and he is status post his first cycle of IV nivolumab 480 mg every 4 weeks in addition to his oral treatment with Cabometyx 40 mg p.o. daily.   Labs were reviewed.  His LFTs are mildly elevated with an ALT of 56 and AST 48.  The patient was advised to refrain from any alcohol or Tylenol we will check recheck this closely at his next appointment given his history of hepatotoxicity from [facility]. I reviewed his labs with Dr. Arbutus Ped.   He is scheduled for PET  scan next week.  We will assess the sternal area as the patient's wife felt some increased nodularity in this area recently.  We will also assess his low back.  Recommend that he on the same treatment at the same dose.  We will see him back for follow-up visit in 2 weeks for evaluation repeat blood work before undergoing cycle #2.  It is okay to have his dental filling and cleaning performed. We will hold off on xgeva at this time until he receives clearance.   He met with the oral chemotherapy pharmacist to give him some additional cabometyx due to delays with [facility].   The patient was advised to call immediately if he has any concerning symptoms in the interval. The patient voices understanding of current disease status and treatment options and is in agreement with the current care plan. All questions were answered. The patient knows to call the clinic with any problems, questions or concerns. We can certainly see the patient much sooner if necessary     No orders of the defined types were placed in this encounter.    The total time spent in the appointment was 20-29  minutes  Estes Lehner L Tarina Volk, PA-C 01/09/24

## 2024-01-12 ENCOUNTER — Ambulatory Visit
Admission: RE | Admit: 2024-01-12 | Discharge: 2024-01-12 | Disposition: A | Discharge status: Home / Self Care | Payer: BC Managed Care – PPO | Source: Ambulatory Visit | Attending: Radiation Oncology | Admitting: Radiation Oncology

## 2024-01-12 ENCOUNTER — Other Ambulatory Visit: Payer: Self-pay

## 2024-01-12 DIAGNOSIS — C649 Malignant neoplasm of unspecified kidney, except renal pelvis: Secondary | ICD-10-CM | POA: Diagnosis not present

## 2024-01-12 DIAGNOSIS — C7951 Secondary malignant neoplasm of bone: Secondary | ICD-10-CM

## 2024-01-12 LAB — RAD ONC ARIA SESSION SUMMARY
Course Elapsed Days: 6
Plan Fractions Treated to Date: 3
Plan Fractions Treated to Date: 3
Plan Prescribed Dose Per Fraction: 10 Gy
Plan Prescribed Dose Per Fraction: 10 Gy
Plan Total Fractions Prescribed: 5
Plan Total Fractions Prescribed: 5
Plan Total Prescribed Dose: 50 Gy
Plan Total Prescribed Dose: 50 Gy
Reference Point Dosage Given to Date: 30 Gy
Reference Point Dosage Given to Date: 30 Gy
Reference Point Session Dosage Given: 10 Gy
Reference Point Session Dosage Given: 10 Gy
Session Number: 3

## 2024-01-13 ENCOUNTER — Ambulatory Visit: Payer: BC Managed Care – PPO | Admitting: Radiation Oncology

## 2024-01-13 ENCOUNTER — Telehealth: Payer: Self-pay | Admitting: Medical Oncology

## 2024-01-13 ENCOUNTER — Ambulatory Visit (HOSPITAL_COMMUNITY)
Admission: RE | Admit: 2024-01-13 | Discharge: 2024-01-13 | Disposition: A | Discharge status: Home / Self Care | Payer: BC Managed Care – PPO | Source: Ambulatory Visit | Attending: Radiation Oncology | Admitting: Radiation Oncology

## 2024-01-13 DIAGNOSIS — C7931 Secondary malignant neoplasm of brain: Secondary | ICD-10-CM | POA: Insufficient documentation

## 2024-01-13 DIAGNOSIS — C801 Malignant (primary) neoplasm, unspecified: Secondary | ICD-10-CM | POA: Diagnosis not present

## 2024-01-13 DIAGNOSIS — G936 Cerebral edema: Secondary | ICD-10-CM | POA: Diagnosis not present

## 2024-01-13 MED ORDER — GADOBUTROL 1 MMOL/ML IV SOLN
10.0000 mL | Freq: Once | INTRAVENOUS | Status: AC | PRN
Start: 2024-01-13 — End: 2024-01-13
  Administered 2024-01-13: 10 mL via INTRAVENOUS

## 2024-01-13 NOTE — Telephone Encounter (Signed)
 PET scan status-Wife called and wanted to know the status of the PET scan.

## 2024-01-14 ENCOUNTER — Other Ambulatory Visit: Payer: Self-pay | Admitting: Physician Assistant

## 2024-01-14 ENCOUNTER — Ambulatory Visit
Admission: RE | Admit: 2024-01-14 | Discharge: 2024-01-14 | Disposition: A | Discharge status: Home / Self Care | Payer: BC Managed Care – PPO | Source: Ambulatory Visit | Attending: Radiation Oncology | Admitting: Radiation Oncology

## 2024-01-14 ENCOUNTER — Encounter: Payer: Self-pay | Admitting: Internal Medicine

## 2024-01-14 ENCOUNTER — Telehealth: Payer: Self-pay | Admitting: Medical Oncology

## 2024-01-14 ENCOUNTER — Telehealth: Payer: Self-pay | Admitting: Physician Assistant

## 2024-01-14 ENCOUNTER — Other Ambulatory Visit: Payer: Self-pay

## 2024-01-14 DIAGNOSIS — C649 Malignant neoplasm of unspecified kidney, except renal pelvis: Secondary | ICD-10-CM

## 2024-01-14 DIAGNOSIS — C7951 Secondary malignant neoplasm of bone: Secondary | ICD-10-CM | POA: Diagnosis not present

## 2024-01-14 DIAGNOSIS — C7931 Secondary malignant neoplasm of brain: Secondary | ICD-10-CM

## 2024-01-14 LAB — RAD ONC ARIA SESSION SUMMARY
Course Elapsed Days: 8
Plan Fractions Treated to Date: 4
Plan Fractions Treated to Date: 4
Plan Prescribed Dose Per Fraction: 10 Gy
Plan Prescribed Dose Per Fraction: 10 Gy
Plan Total Fractions Prescribed: 5
Plan Total Fractions Prescribed: 5
Plan Total Prescribed Dose: 50 Gy
Plan Total Prescribed Dose: 50 Gy
Reference Point Dosage Given to Date: 40 Gy
Reference Point Dosage Given to Date: 40 Gy
Reference Point Session Dosage Given: 10 Gy
Reference Point Session Dosage Given: 10 Gy
Session Number: 4

## 2024-01-14 MED ORDER — LORAZEPAM 0.5 MG PO TABS: ORAL_TABLET | ORAL | 0 refills | Status: DC

## 2024-01-14 MED ORDER — CABOMETYX 40 MG PO TABS: 40.0000 mg | ORAL_TABLET | Freq: Every day | ORAL | 3 refills | Status: DC

## 2024-01-14 NOTE — Telephone Encounter (Signed)
 I talked to the patient's insurance company today for a P2P.  His PET scan is authorized at this time and the authorization number is [id number].  I called the patient to let him know that he can call radiology scheduling and schedule his PET scan at his earliest convenience.

## 2024-01-14 NOTE — Telephone Encounter (Signed)
 For Peer to peer , provider must call [employer] at [phone] case ID [id number] .

## 2024-01-14 NOTE — Telephone Encounter (Signed)
 Spoke with Michael [name] with [employer] at [phone], case ID [id number].  She states that a scheduled p2p is not needed.  Call number above with case ID and call will be transferred to the provider. Once p2p is done, patient will be notified.

## 2024-01-14 NOTE — Telephone Encounter (Signed)
 Last time he was told he needed a peer to peer -it was not done.

## 2024-01-15 ENCOUNTER — Inpatient Hospital Stay (HOSPITAL_BASED_OUTPATIENT_CLINIC_OR_DEPARTMENT_OTHER): Payer: BC Managed Care – PPO | Admitting: Physician Assistant

## 2024-01-15 ENCOUNTER — Other Ambulatory Visit (HOSPITAL_COMMUNITY): Payer: Self-pay

## 2024-01-15 ENCOUNTER — Ambulatory Visit: Payer: BC Managed Care – PPO | Admitting: Radiation Oncology

## 2024-01-15 ENCOUNTER — Inpatient Hospital Stay: Payer: BC Managed Care – PPO | Attending: Oncology

## 2024-01-15 ENCOUNTER — Encounter: Payer: Self-pay | Admitting: Internal Medicine

## 2024-01-15 VITALS — BP 129/90 | HR 67 | Temp 97.9°F | Resp 17 | Ht 66.0 in | Wt 261.3 lb

## 2024-01-15 DIAGNOSIS — C7800 Secondary malignant neoplasm of unspecified lung: Secondary | ICD-10-CM | POA: Insufficient documentation

## 2024-01-15 DIAGNOSIS — M545 Low back pain, unspecified: Secondary | ICD-10-CM | POA: Diagnosis not present

## 2024-01-15 DIAGNOSIS — Z79890 Hormone replacement therapy (postmenopausal): Secondary | ICD-10-CM | POA: Insufficient documentation

## 2024-01-15 DIAGNOSIS — K7689 Other specified diseases of liver: Secondary | ICD-10-CM | POA: Diagnosis not present

## 2024-01-15 DIAGNOSIS — C7951 Secondary malignant neoplasm of bone: Secondary | ICD-10-CM | POA: Insufficient documentation

## 2024-01-15 DIAGNOSIS — C649 Malignant neoplasm of unspecified kidney, except renal pelvis: Secondary | ICD-10-CM

## 2024-01-15 DIAGNOSIS — Z881 Allergy status to other antibiotic agents status: Secondary | ICD-10-CM | POA: Diagnosis not present

## 2024-01-15 DIAGNOSIS — Z905 Acquired absence of kidney: Secondary | ICD-10-CM | POA: Diagnosis not present

## 2024-01-15 DIAGNOSIS — Z9226 Personal history of immune checkpoint inhibitor therapy: Secondary | ICD-10-CM | POA: Diagnosis not present

## 2024-01-15 DIAGNOSIS — Z5112 Encounter for antineoplastic immunotherapy: Secondary | ICD-10-CM | POA: Insufficient documentation

## 2024-01-15 DIAGNOSIS — Z880 Allergy status to penicillin: Secondary | ICD-10-CM | POA: Diagnosis not present

## 2024-01-15 DIAGNOSIS — Z79899 Other long term (current) drug therapy: Secondary | ICD-10-CM | POA: Diagnosis not present

## 2024-01-15 DIAGNOSIS — C7931 Secondary malignant neoplasm of brain: Secondary | ICD-10-CM | POA: Insufficient documentation

## 2024-01-15 DIAGNOSIS — G939 Disorder of brain, unspecified: Secondary | ICD-10-CM | POA: Insufficient documentation

## 2024-01-15 DIAGNOSIS — K754 Autoimmune hepatitis: Secondary | ICD-10-CM | POA: Diagnosis not present

## 2024-01-15 DIAGNOSIS — C641 Malignant neoplasm of right kidney, except renal pelvis: Secondary | ICD-10-CM | POA: Insufficient documentation

## 2024-01-15 DIAGNOSIS — I700 Atherosclerosis of aorta: Secondary | ICD-10-CM | POA: Diagnosis not present

## 2024-01-15 LAB — CMP (CANCER CENTER ONLY)
ALT: 56 U/L — ABNORMAL HIGH (ref 0–44)
AST: 48 U/L — ABNORMAL HIGH (ref 15–41)
Albumin: 3.9 g/dL (ref 3.5–5.0)
Alkaline Phosphatase: 97 U/L (ref 38–126)
Anion gap: 8 (ref 5–15)
BUN: 18 mg/dL (ref 6–20)
CO2: 26 mmol/L (ref 22–32)
Calcium: 8.7 mg/dL — ABNORMAL LOW (ref 8.9–10.3)
Chloride: 103 mmol/L (ref 98–111)
Creatinine: 1.24 mg/dL (ref 0.61–1.24)
GFR, Estimated: >60 mL/min (ref >60)
Glucose, Bld: 104 mg/dL — ABNORMAL HIGH (ref 70–99)
Potassium: 3.7 mmol/L (ref 3.5–5.1)
Sodium: 137 mmol/L (ref 135–145)
Total Bilirubin: 0.7 mg/dL (ref 0.0–1.2)
Total Protein: 7.1 g/dL (ref 6.5–8.1)

## 2024-01-15 LAB — CBC WITH DIFFERENTIAL (CANCER CENTER ONLY)
Abs Immature Granulocytes: 0.01 10*3/uL (ref 0.00–0.07)
Basophils Absolute: 0 10*3/uL (ref 0.0–0.1)
Basophils Relative: 1 %
Eosinophils Absolute: 0.3 10*3/uL (ref 0.0–0.5)
Eosinophils Relative: 10 %
HCT: 40.9 % (ref 39.0–52.0)
Hemoglobin: 13.4 g/dL (ref 13.0–17.0)
Immature Granulocytes: 0 %
Lymphocytes Relative: 19 %
Lymphs Abs: 0.7 10*3/uL (ref 0.7–4.0)
MCH: 25.4 pg — ABNORMAL LOW (ref 26.0–34.0)
MCHC: 32.8 g/dL (ref 30.0–36.0)
MCV: 77.5 fL — ABNORMAL LOW (ref 80.0–100.0)
Monocytes Absolute: 0.3 10*3/uL (ref 0.1–1.0)
Monocytes Relative: 9 %
Neutro Abs: 2.2 10*3/uL (ref 1.7–7.7)
Neutrophils Relative %: 61 %
Platelet Count: 246 10*3/uL (ref 150–400)
RBC: 5.28 MIL/uL (ref 4.22–5.81)
RDW: 14.3 % (ref 11.5–15.5)
WBC Count: 3.5 10*3/uL — ABNORMAL LOW (ref 4.0–10.5)
nRBC: 0 % (ref 0.0–0.2)

## 2024-01-16 ENCOUNTER — Ambulatory Visit
Admission: RE | Admit: 2024-01-16 | Discharge: 2024-01-16 | Disposition: A | Discharge status: Home / Self Care | Payer: BC Managed Care – PPO | Source: Ambulatory Visit | Attending: Radiation Oncology | Admitting: Radiation Oncology

## 2024-01-16 ENCOUNTER — Other Ambulatory Visit: Payer: Self-pay

## 2024-01-16 DIAGNOSIS — C7951 Secondary malignant neoplasm of bone: Secondary | ICD-10-CM

## 2024-01-16 DIAGNOSIS — Z510 Encounter for antineoplastic radiation therapy: Secondary | ICD-10-CM | POA: Diagnosis not present

## 2024-01-16 DIAGNOSIS — C649 Malignant neoplasm of unspecified kidney, except renal pelvis: Secondary | ICD-10-CM | POA: Diagnosis not present

## 2024-01-16 LAB — RAD ONC ARIA SESSION SUMMARY
Course Elapsed Days: 10
Plan Fractions Treated to Date: 5
Plan Fractions Treated to Date: 5
Plan Prescribed Dose Per Fraction: 10 Gy
Plan Prescribed Dose Per Fraction: 10 Gy
Plan Total Fractions Prescribed: 5
Plan Total Fractions Prescribed: 5
Plan Total Prescribed Dose: 50 Gy
Plan Total Prescribed Dose: 50 Gy
Reference Point Dosage Given to Date: 50 Gy
Reference Point Dosage Given to Date: 50 Gy
Reference Point Session Dosage Given: 10 Gy
Reference Point Session Dosage Given: 10 Gy
Session Number: 5

## 2024-01-18 ENCOUNTER — Encounter: Payer: Self-pay | Admitting: Internal Medicine

## 2024-01-19 NOTE — Radiation Completion Notes (Signed)
 Patient Name: Michael [name], Michael [name] MRN: [id number] Date of Birth: 08-28-1966 Referring Physician: Si Gaul, M.D. Date of Service: 2024-01-19 Radiation Oncologist: Margaretmary Bayley, M.D. [facility]                             RADIATION ONCOLOGY END OF TREATMENT NOTE     Diagnosis: C79.51 Secondary malignant neoplasm of bone Staging on 2022-01-30: Kidney cancer, primary, with metastasis from kidney to other site [facility]) T=cTX, N=cNX, M=cM1 Intent: Curative     ==========DELIVERED PLANS==========  First Treatment Date: 2024-01-06 Last Treatment Date: 2024-01-16   Plan Name: Chest_R_SBRT Site: Ribs, Right Technique: SBRT/SRT-IMRT Mode: Photon Dose Per Fraction: 10 Gy Prescribed Dose (Delivered / Prescribed): 50 Gy / 50 Gy Prescribed Fxs (Delivered / Prescribed): 5 / 5   Plan Name: Spine_T_SBRT Site: Thoracic Spine Technique: SBRT/SRT-IMRT Mode: Photon Dose Per Fraction: 10 Gy Prescribed Dose (Delivered / Prescribed): 50 Gy / 50 Gy Prescribed Fxs (Delivered / Prescribed): 5 / 5     ==========ON TREATMENT VISIT DATES========== 2024-01-06, 2024-01-06, 2024-01-08, 2024-01-08, 2024-01-12, 2024-01-12, 2024-01-14, 2024-01-14, 2024-01-16, 2024-01-16, 2024-01-16     ==========UPCOMING VISITS========== 03/24/2024 [facility] INFUSION 2HR30MIN (150) [facility] INFUSION  03/24/2024 [facility] EST PT 15 Si Gaul, MD  03/24/2024 [facility] LAB [facility] LAB  02/24/2024 [facility] INFUSION 1HR30MIN (90) [facility] INFUSION  02/24/2024 [facility] EST PT 30 Heilingoetter, Cassandra L, PA-C  02/24/2024 [facility] LAB [facility]  01/27/2024 [facility]  01/27/2024 [facility] EST PT 30 Heilingoetter, Cassandra L, PA-C  01/27/2024 [facility]COLOG[facility] INFUSION 1HR30MIN (90) [facility] INFUSION  01/22/2024 [facility] NM PET WB & SB TO MID  THIGH [facility] PET CT 1  01/21/2024 [facility]RADIATION ONC FOLLOW UP 30 Bruning, Ashlyn, PA-C        ==========APPENDIX - ON TREATMENT VISIT NOTES==========   See weekly On Treatment Notes in Epic for details in the Media tab (listed as Progress notes on the On Treatment Visit Dates listed above).

## 2024-01-20 NOTE — Progress Notes (Signed)
 [facility] OFFICE PROGRESS NOTE  [name], MD [address] [place] [address]  DIAGNOSIS: Stage IV clear-cell renal cell carcinoma initially diagnosed as locally advanced disease in 2020 with evidence of pulmonary metastasis in 2021.   PRIOR THERAPY: 1) status post right nephrectomy on August 27, 2019 in [place] and the final pathology showed clear-cell renal cell carcinoma nuclear grade 3 with 10% necrosis and negative margin with the final pathologic stage of T2a, NX. 2) status post treatment with Keytruda and axitinib started March 2021 for 2 cycles interrupted because of autoimmune hepatitis.  3) axitinib 5 mg daily started July 05, 2020 discontinued after 1 week because of elevated LFTs. 4) status post cryoablation of left renal mass on May 30, 2021. 5) Keytruda 200 Mg IV every 3 weeks started July 27, 2021 and then switch it to 400 Mg every 6 weeks on November 09, 2021.  Last dose was given July 2024. 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 Conchita Paris  CURRENT THERAPY: 1) Opdivo 480 mg IV every 4 weeks and Cabometyx 40 mg p.o. daily first dose on 12/30/23. Status post 1 cycle of treatment.  2) SBRT to the right chest wall/sternum and T9 under the care of Dr. Kathrynn Running starting next week. Last dose on 01/16/24 3) Xgeva once the patient obtains dental clearance   INTERVAL HISTORY: Nil Xiong 58 y.o. male returns to the clinic today for a follow-up visit accompanied by his wife.  The patient was last seen in the clinic by myself on 01/15/24. The patient was recently found to have disease progression and he was started back on systemic treatment with cabometyx and nivolumab.  He is also undergoing SBRT to the right chest wall/sternal lesion and T9 under the care of Dr. Kathrynn Running which he completed.  His LFTs were mildly elevated at his last  appointment. He denied significant tylenol use. Denies abdominal pain or jaundice he did have elevated LFTs when he was on Inlyta in the past when he lived in [place].    We also recommended that the patient obtain dental clearance with Xgeva.  The patient reached out to his dentist who recommended having a crown and filling placed prior to [facility].   They recommended stretches for the patient.  The patient denies any pain.  Sternal lesion is getting smaller since undergoing radiation.  Twice in the interval since last being seen the patient does mention some mild burning in the tongue and oral mucosa without any lesions.  He reports that his mouth is dry and sensitive.  He had similar symptoms when he was on Keytruda in the past.  He is not having any pain at this time but his pain may be exacerbated by certain irritants such as acidic foods and certain seasonings.  At its worst the pain is a 2 out of 10.   Otherwise the patient denies any recent fever, chills, night sweats, unexplained weight loss. Denies any unusual shortness of breath, cough, or hemoptysis.  Friday the patient had a firm bowel movement and had some mild blood in the toilet bowl/paper.  He had other bowel movements following this without any blood.  He never had a colonoscopy to know if he has any hemorrhoids.   Denies any nausea, vomiting, or diarrhea.  He has not taken any Tylenol since he was last seen.  Had a little rash  on his back at 2 appointments ago which may be lightening up but still persist.   He recently had a PET scan to ensure no other metastatic disease. He is here today for evaluation and repeat blood work before undergoing cycle #2     MEDICAL HISTORY: Past Medical History:  Diagnosis Date   Cancer (HCC)    Renal cell carcinoma   Clear cell renal cell carcinoma (HCC)    Hypertension    Hypothyroidism    Sleep apnea    wears cpap    ALLERGIES:  is allergic to cleocin [clindamycin], penicillins, and  tape.  MEDICATIONS:  Current Outpatient Medications  Medication Sig Dispense Refill   methylPREDNISolone (MEDROL DOSEPAK) 4 MG TBPK tablet Use as instructed 21 tablet 0   amLODipine (NORVASC) 5 MG tablet Take 7.5 mg by mouth daily before breakfast.     cabozantinib (CABOMETYX) 40 MG tablet Take 1 tablet (40 mg total) by mouth daily. Take on an empty stomach, 1 hour before or 2 hours after meals. 30 tablet 3   cyclobenzaprine (FLEXERIL) 10 MG tablet Take 1 tablet po 2x a day prn Orally as directed for 30 days     doxycycline (VIBRA-TABS) 100 MG tablet Take 1 tablet (100 mg total) by mouth 2 (two) times daily. (Patient not taking: Reported on 01/15/2024) 20 tablet 0   fluconazole (DIFLUCAN) 150 MG tablet Take 150 mg by mouth daily. (Patient not taking: Reported on 01/15/2024)     levothyroxine (SYNTHROID) 137 MCG tablet TAKE 1 TABLET(137 MCG) BY MOUTH DAILY BEFORE BREAKFAST 30 tablet 2   LORazepam (ATIVAN) 0.5 MG tablet 1 tablet p.o. 30 minutes before the PET scan repeat x 1 if needed. 2 tablet 0   magic mouthwash SOLN Take 5 mLs by mouth 3 (three) times daily as needed for mouth pain. Suspension contains equal amounts of Maalox Extra Strength, nystatin, and diphenhydramine. 240 mL 0   methocarbamol (ROBAXIN) 500 MG tablet Take 500 mg by mouth every 8 (eight) hours as needed for muscle spasms. (Patient not taking: Reported on 01/15/2024)     montelukast (SINGULAIR) 10 MG tablet Take 10 mg by mouth daily before breakfast.     nystatin (MYCOSTATIN) 100000 UNIT/ML suspension Take by mouth. (Patient not taking: Reported on 01/15/2024)     ondansetron (ZOFRAN) 8 MG tablet Take 1 tablet (8 mg total) by mouth every 8 (eight) hours as needed for nausea or vomiting. (Patient not taking: Reported on 01/15/2024) 30 tablet 1   prochlorperazine (COMPAZINE) 10 MG tablet Take 1 tablet (10 mg total) by mouth every 6 (six) hours as needed for nausea or vomiting. (Patient not taking: Reported on 01/15/2024) 30 tablet 1    tadalafil (CIALIS) 20 MG tablet Take 10-20 mg by mouth daily as needed for erectile dysfunction.     No current facility-administered medications for this visit.    SURGICAL HISTORY:  Past Surgical History:  Procedure Laterality Date   APPLICATION OF CRANIAL NAVIGATION N/A 10/13/2023   Procedure: APPLICATION OF CRANIAL NAVIGATION;  Surgeon: Lisbeth Renshaw, MD;  Location: [facility] OR;  Service: Neurosurgery;  Laterality: N/A;   CRANIOTOMY N/A 10/13/2023   Procedure: SUBOCCIPITAL   CRANIOTOMY TUMOR EXCISION;  Surgeon: Lisbeth Renshaw, MD;  Location: [facility] OR;  Service: Neurosurgery;  Laterality: N/A;   IR RADIOLOGIST EVAL & MGMT  04/04/2021   no procedures performed   IR RADIOLOGIST EVAL & MGMT  05/08/2021   no procedures performed   IR RADIOLOGIST EVAL & MGMT  08/12/2022  LUNG BIOPSY  2021   RADIOLOGY WITH ANESTHESIA N/A 05/30/2021   Procedure: CT WITH ANESTHESIA CRYOABLATION;  Surgeon: Irish Lack, MD;  Location: [facility] ORS;  Service: Radiology;  Laterality: N/A;   right nephrectomy Right 08/2019   TONSILLECTOMY      REVIEW OF SYSTEMS:   Constitutional: Negative for appetite change, chills, fatigue, fever and unexpected weight change.  HENT: Positive for some dry mouth and soreness. Negative for mouth sores, nosebleeds, sore throat and trouble swallowing.   Eyes: Negative for eye problems and icterus.  Respiratory: Negative for cough, hemoptysis, shortness of breath and wheezing.   Cardiovascular: Negative for chest pain and leg swelling.  Gastrointestinal: Negative for abdominal pain, constipation, diarrhea, nausea and vomiting.  Genitourinary: Negative for bladder incontinence, difficulty urinating, dysuria, frequency and hematuria.   Musculoskeletal: Improving metastatic lesion over sternum. Negative for back pain, gait problem, neck pain and neck stiffness.  Skin: Positive for rash/skin lesions on back.  Neurological: Negative for dizziness, extremity weakness, gait problem,  headaches, light-headedness and seizures.  Hematological: Negative for adenopathy. Does not bruise/bleed easily.  Psychiatric/Behavioral: Negative for confusion, depression and sleep disturbance. The patient is not nervous/anxious.     PHYSICAL EXAMINATION:  Blood pressure (!) 129/98, pulse 72, temperature 98.1 F (36.7 C), temperature source Temporal, resp. rate 13, weight 256 lb 3.2 oz (116.2 kg), SpO2 99%.  ECOG PERFORMANCE STATUS: 1  Physical Exam  Constitutional: Oriented to person, place, and time and well-developed, well-nourished, and in no distress.  HENT:  Head: Normocephalic and atraumatic.  Mouth/Throat: Oropharynx is clear and moist. No oropharyngeal exudate.  Eyes: Conjunctivae are normal. Right eye exhibits no discharge. Left eye exhibits no discharge. No scleral icterus.  Neck: Normal range of motion. Neck supple.  Cardiovascular: Normal rate, regular rhythm, normal heart sounds and intact distal pulses.   Pulmonary/Chest: Effort normal and breath sounds normal. No respiratory distress. No wheezes. No rales.  Abdominal: Soft. Bowel sounds are normal. Exhibits no distension and no mass. There is no tenderness.  Musculoskeletal: More pronounced right sternum compared to left with palpation. Normal range of motion. Exhibits no edema.  Lymphadenopathy:    No cervical adenopathy.  Neurological: Alert and oriented to person, place, and time. Exhibits normal muscle tone. Gait normal. Coordination normal.  Skin: Skin is warm and dry. Not diaphoretic. No erythema. No pallor.  Psychiatric: Mood, memory and judgment normal.  Vitals reviewed.  LABORATORY DATA: Lab Results  Component Value Date   WBC 4.6 01/27/2024   HGB 14.7 01/27/2024   HCT 43.1 01/27/2024   MCV 76.8 (L) 01/27/2024   PLT 123 (L) 01/27/2024      Chemistry      Component Value Date/Time   NA 138 01/27/2024 1101   K 3.9 01/27/2024 1101   CL 104 01/27/2024 1101   CO2 27 01/27/2024 1101   BUN 13  01/27/2024 1101   CREATININE 1.23 01/27/2024 1101      Component Value Date/Time   CALCIUM 9.3 01/27/2024 1101   ALKPHOS 130 (H) 01/27/2024 1101   AST 71 (H) 01/27/2024 1101   ALT 92 (H) 01/27/2024 1101   BILITOT 0.7 01/27/2024 1101       RADIOGRAPHIC STUDIES:  NM PET Image Restage (PS) Skull Base to Thigh (F-18 FDG) Result Date: 01/27/2024 CLINICAL DATA:  Subsequent treatment strategy for metastatic renal cell carcinoma. EXAM: NUCLEAR MEDICINE PET SKULL BASE TO THIGH TECHNIQUE: 12.9 mCi F-18 FDG was injected intravenously. Full-ring PET imaging was performed from the skull base to  thigh after the radiotracer. CT data was obtained and used for attenuation correction and anatomic localization. Fasting blood glucose: 105 mg/dl COMPARISON:  CT 81/19/1478 FINDINGS: NECK: No hypermetabolic lymph nodes in the neck. Incidental CT findings: None. CHEST: No hypermetabolic mediastinal lymph nodes. No suspicious pulmonary nodules. No axillary or supraclavicular adenopathy. Incidental CT findings: None. ABDOMEN/PELVIS: Low-density lesions in the liver without radiotracer activity consistent benign cysts. No hypermetabolic abdominal or pelvic lymph nodes. Post RIGHT nephrectomy. Incidental CT findings: None. SKELETON: Hypermetabolic lesion at the T9 vertebral body. Lesion has ruptured through the anterior cortex with soft tissue extension into the paraspinal space. Lesion measures 2.7 x 2.2 cm on image 91 with intense metabolic activity (SUV max equal 7.0). Second skeletal lesion at the junction of the manubrium and first rib. This is expansile soft tissue lesion centered in the bone measuring 5.3 x 3.7 cm with SUV max equal 6.9 on image 52. Incidental CT findings: None. IMPRESSION: 1. Hypermetabolic skeletal metastasis at the T9 vertebral body and at the junction of the manubrium and first rib. 2. No evidence of visceral metastasis or nodal metastasis. 3. Post RIGHT nephrectomy. Electronically Signed   By:  Genevive Bi M.D.   On: 01/27/2024 11:13   MR Brain W Wo Contrast Result Date: 01/13/2024 CLINICAL DATA:  Brain metastases, assess treatment response 3T SRS Protocol. Follow-up of treated brain met. Metastatic clear cell renal cell carcinoma. History of cerebellar metastasis status post SRS and resection in 10/2023. EXAM: MRI HEAD WITHOUT AND WITH CONTRAST TECHNIQUE: Multiplanar, multiecho pulse sequences of the brain and surrounding structures were obtained without and with intravenous contrast. CONTRAST:  10mL GADAVIST GADOBUTROL 1 MMOL/ML IV SOLN COMPARISON:  Head MRI 10/14/2023 FINDINGS: Brain: Sequelae of right cerebellar mass resection are again identified. Mild enhancement at the resection site is predominantly curvilinear in configuration. No masslike or clearly suspicious enhancement is evident. Cerebellar edema has resolved. No new enhancing intracranial lesion, acute infarct, acute hemorrhage, or midline shift is identified. The ventricles are normal in size. A small extra-axial fluid collection at the craniectomy site has mildly decreased in size. A small portion of the posterior right cerebellar hemisphere herniates into the craniectomy defect. Vascular: Major intracranial vascular flow voids are preserved. Skull and upper cervical spine: Right suboccipital craniectomy. No suspicious marrow lesion. Sinuses/Orbits: Unremarkable orbits. Paranasal sinuses and mastoid air cells are clear. Moderate right mastoid effusion. Other: None. IMPRESSION: 1. Sequelae of right cerebellar mass resection with resolved edema and no suspicious enhancement. 2. No evidence of new intracranial metastases. Electronically Signed   By: Sebastian Ache M.D.   On: 01/13/2024 17:45     ASSESSMENT/PLAN:  This is a very pleasant 58 year old male with stage IV clear-cell renal cell carcinoma. This was initially diagnosed with locally advanced disease in 2020. He had evidence of pulmonary metastases in 2021. He is status post  right nephrectomy on 10/23/202020 in [place]. The final pathology showed clear-cell renal cell carcinoma nuclear grade 3 with 10% necrosis and negative margins. The final pathologic stage was T2a, NX.    The patient started treatment with Keytruda and Axitinib that was started in March 2021.  He had 2 cycles of treatment but it was interrupted due to autoimmune hepatitis.  He then started on 5 mg daily of axitinib on 07/05/2020 but this was also discontinued after 1 week due to elevated LFTs.   He underwent cryoablation to the left renal mass on 05/30/2021.   He then underwent treatment with Keytruda.  He was initially  on 200 mg IV every 3 weeks on 07/27/2021 but then was switched to 400 mg every 6 weeks starting from 11/09/2021.   He was found to have a solitary right cerebellar brain lesion in November 2024 and underwent SRS on 10/10/2023 followed by craniotomy on 10/13/2023.   He was recently found to have disease progression and February 2024 with a lytic lesion in the upper sternum.  The patient is planning to to this lesion under the care of Dr. Kathrynn Running as well as the T9 lesion.  The last day radiation is scheduled for 01/16/24   The patient was started back on treatment and he is status post his first cycle of IV nivolumab 480 mg every 4 weeks in addition to his oral treatment with Cabometyx 40 mg p.o. daily.   He recently had a PET scan.  The PET scan did not show any other sites of disease.  There is some shrinkage of the sternal lesion.  The patient was seen    Labs were reviewed.  His LFTs are slightly more elevated today. His AST is 71 (compared to 48) and his ALT is 92 vs 56 2 weeks ago .  He has a history of hepatotoxicity from Inlyta in the past where his LFTs got to the 800-900 range.   The patient was seen with Dr. Arbutus Ped today.  Dr. Arbutus Ped personally and independently reviewed the scan and discussed results with the patient today.  The scan showed no other sites of disease and there is  some improvement from his recent radiation to the sternal lesion. The scan did not show any other sites of metastatic disease besides the known T9 and sternal lesion.   His blood pressure is also elevated today.  He took his antihypertensive as prescribed. He alternates between 5 mg and 10 mg of norvasc. His BP improved when taking with larger BP cuff. He will check this at home. If out of goal, he will follow up with his PCP  His treatment with Cabometyx could cause hypertension.   I reviewed his liver enzymes, blood pressure, and mouth burning with Dr. Arbutus Ped today.  Dr. Arbutus Ped recommended holding his nivo today and prescribing a medrol dosepack. If improvement in his LFTs, then likely due to nivo. If no improvement in his LFTs at his next appointment, then may be due to cabometyx. He will continue cabometyx for now. He knows should he develop jaundice, abdominal pain, etc to call us sooner for repeat labs. We reviewed s side effects of steroids.   For the mouth burning, I sent Magic mouthwash to the pharmacy.  He was advised to use salt water rinses and Biotene as well.  He is going out of town next week. We will see him when he returns on 02/10/24.    It is okay to have his dental filling and cleaning performed. We will hold off on xgeva at this time until he receives clearance.    The patient was advised to call immediately if he has any concerning symptoms in the interval. The patient voices understanding of current disease status and treatment options and is in agreement with the current care plan. All questions were answered. The patient knows to call the clinic with any problems, questions or concerns. We can certainly see the patient much sooner if necessary          Orders Placed This Encounter  Procedures   CBC with Differential (Cancer Center Only)    Standing Status:  Future    Expected Date:   02/10/2024    Expiration Date:   02/09/2025   CMP (Cancer Center only)    Standing  Status:   Future    Expected Date:   02/10/2024    Expiration Date:   02/09/2025     Lacrecia Delval L Zamyiah Tino, PA-C 01/27/24  ADDENDUM: Hematology/Oncology Attending: I had a face-to-face encounter with the patient today.  I reviewed his record, lab, scan and recommended his care plan.  This is a very pleasant 58 years old white male with a stage IV clear-cell renal cell carcinoma that was initially diagnosed as locally advanced disease in 2020 with evidence of pulmonary metastasis in 2021.  The patient has been on several treatment in the past including treatment with [facility] as well as axitinib and radiotherapy to solitary brain metastasis and most recently SBRT to right chest wall/sternum and T9 vertebral body lesion.  He is currently undergoing treatment with nivolumab 480 Mg IV every 4 weeks status post 1 cycle in addition to Cabometyx 40 mg p.o. daily.  He has been tolerating his treatment fairly well.  He has improvement in the pain issues after the palliative radiotherapy. He had a PET scan performed recently and that showed no other concerning findings of the disease progression besides the known lesion seen on the recent CT scan of the chest, abdomen and pelvis. I recommended for the patient to continue his current treatment but we will hold his treatment with nivolumab for this week because of elevated liver enzymes.  Will start him on Medrol Dosepak in the next few days. He will resume his treatment again on 02/09/2024 if there is improvement of his liver dysfunction. The patient was also advised to get a dental clearance before starting treatment with Xgeva for the bone metastasis. He was advised to call immediately if he has any other concerning symptoms in the interval. The total time spent in the appointment was 30 minutes. Disclaimer: This note was dictated with voice recognition software. Similar sounding words can inadvertently be transcribed and may be missed upon review. Lajuana Matte, MD

## 2024-01-21 ENCOUNTER — Other Ambulatory Visit: Payer: Self-pay | Admitting: Radiation Therapy

## 2024-01-21 ENCOUNTER — Encounter: Payer: Self-pay | Admitting: Urology

## 2024-01-21 ENCOUNTER — Other Ambulatory Visit: Payer: Self-pay

## 2024-01-21 ENCOUNTER — Ambulatory Visit
Admission: RE | Admit: 2024-01-21 | Discharge: 2024-01-21 | Disposition: A | Discharge status: Home / Self Care | Payer: BC Managed Care – PPO | Source: Ambulatory Visit | Attending: Urology | Admitting: Urology

## 2024-01-21 VITALS — Ht 66.0 in | Wt 260.0 lb

## 2024-01-21 DIAGNOSIS — C649 Malignant neoplasm of unspecified kidney, except renal pelvis: Secondary | ICD-10-CM

## 2024-01-21 DIAGNOSIS — C7931 Secondary malignant neoplasm of brain: Secondary | ICD-10-CM

## 2024-01-21 NOTE — Progress Notes (Signed)
 Radiation Oncology         [phone] ________________________________  Name: Michael [name] MRN: [id number]  Date: 01/21/2024  DOB: 1966/05/31  Post Treatment Note  CC: [name], MD  [name], MD  Diagnosis:    58 y.o. man with a solitary brain metastasis and osseous metastases at T9 and in the manubrium, secondary to Stage IV clear cell renal cell carcinoma   Interval Since Last Radiation:  3 months s/p SRS Brain; 1 week s/p SBRT to the osseous metastases in the manubrium and T9 vertebral body   01/06/24 - 01/16/24: The osseous metastases in the manubrium and T9 vertebral body were treated to 50 Gy in 5 fractions of 10 Gy each.  10/10/23: The solitary 3 cm right cerebellar brain metastasis was treated to 18 Gy in a single fraction of pre-operative SRS, followed by surgical resection on 10/13/23  Narrative:  I spoke with the patient to conduct his routine scheduled 3 month follow up visit to review results of his post-treatment MRI brain scan from 01/13/24 via telephone to spare the patient unnecessary potential exposure in the healthcare setting during the current COVID-19 pandemic.  The patient was notified in advance and gave permission to proceed with this visit format.  He tolerated the SRS brain treatment well, without any acute side effects.  He also tolerated the recent SBRT treatments to the obvious lesions in the manubrium and T9 vertebral body.  He did notice some mild esophagitis towards the end of treatment and this has lingered but he feels like it is gradually improving.  Really only noticeable when he is eating large bites of foods or meats and breads.  No issues at all with liquids or soft foods.  He has noticed a significant reduction in size of the palpable lesion in the manubrium.  He is starting to regain some sensation in the scalp at his incision site and will occasionally have sharp shooting pains if he lays on it for an extended period of time or presses it really  hard.  Otherwise, he denies frequent or severe headaches, changes in his visual or auditory acuity, focal weakness, nausea/vomiting or difficulties with balance.    His posttreatment MRI brain scan from 01/13/2024 is without any concerning findings for new intracranial metastasis or residual disease.  There is mild enhancement at the resection site, predominantly curvilinear in configuration with no masslike or clearly suspicious enhancement.  The cerebellar edema has resolved and there are no new enhancing intracranial lesions, acute infarct, acute hemorrhage or midline shift.  We reviewed these findings by telephone today.  On review of systems, the patient states that he is doing very well in general and remains without complaints as noted above.  He denies any unintentional weight loss, chest pain, increased shortness of breath, productive cough or hemoptysis.  He has not had any abdominal pain, nausea, vomiting or diarrhea.  He denies any focal weakness or pain in the skeleton. Overall, he is quite pleased with his progress to date.                          ALLERGIES:  is allergic to cleocin [clindamycin], penicillins, and tape.  Meds: Current Outpatient Medications  Medication Sig Dispense Refill   amLODipine (NORVASC) 5 MG tablet Take 7.5 mg by mouth daily before breakfast.     cabozantinib (CABOMETYX) 40 MG tablet Take 1 tablet (40 mg total) by mouth daily. Take on an empty stomach,  1 hour before or 2 hours after meals. 30 tablet 3   cyclobenzaprine (FLEXERIL) 10 MG tablet Take 1 tablet po 2x a day prn Orally as directed for 30 days     doxycycline (VIBRA-TABS) 100 MG tablet Take 1 tablet (100 mg total) by mouth 2 (two) times daily. (Patient not taking: Reported on 01/15/2024) 20 tablet 0   fluconazole (DIFLUCAN) 150 MG tablet Take 150 mg by mouth daily. (Patient not taking: Reported on 01/15/2024)     levothyroxine (SYNTHROID) 137 MCG tablet TAKE 1 TABLET(137 MCG) BY MOUTH DAILY BEFORE  BREAKFAST 30 tablet 2   LORazepam (ATIVAN) 0.5 MG tablet 1 tablet p.o. 30 minutes before the PET scan repeat x 1 if needed. 2 tablet 0   methocarbamol (ROBAXIN) 500 MG tablet Take 500 mg by mouth every 8 (eight) hours as needed for muscle spasms. (Patient not taking: Reported on 01/15/2024)     montelukast (SINGULAIR) 10 MG tablet Take 10 mg by mouth daily before breakfast.     nystatin (MYCOSTATIN) 100000 UNIT/ML suspension Take by mouth. (Patient not taking: Reported on 01/15/2024)     ondansetron (ZOFRAN) 8 MG tablet Take 1 tablet (8 mg total) by mouth every 8 (eight) hours as needed for nausea or vomiting. (Patient not taking: Reported on 01/15/2024) 30 tablet 1   prochlorperazine (COMPAZINE) 10 MG tablet Take 1 tablet (10 mg total) by mouth every 6 (six) hours as needed for nausea or vomiting. (Patient not taking: Reported on 01/15/2024) 30 tablet 1   tadalafil (CIALIS) 20 MG tablet Take 10-20 mg by mouth daily as needed for erectile dysfunction.     No current facility-administered medications for this encounter.    Physical Findings:  vitals were not taken for this visit.   /10 Unable to assess due to telephone follow-up visit format.  Lab Findings: Lab Results  Component Value Date   WBC 3.5 (L) 01/15/2024   HGB 13.4 01/15/2024   HCT 40.9 01/15/2024   MCV 77.5 (L) 01/15/2024   PLT 246 01/15/2024     Radiographic Findings: MR Brain W Wo Contrast Result Date: 01/13/2024 CLINICAL DATA:  Brain metastases, assess treatment response 3T SRS Protocol. Follow-up of treated brain met. Metastatic clear cell renal cell carcinoma. History of cerebellar metastasis status post SRS and resection in 10/2023. EXAM: MRI HEAD WITHOUT AND WITH CONTRAST TECHNIQUE: Multiplanar, multiecho pulse sequences of the brain and surrounding structures were obtained without and with intravenous contrast. CONTRAST:  10mL GADAVIST GADOBUTROL 1 MMOL/ML IV SOLN COMPARISON:  Head MRI 10/14/2023 FINDINGS: Brain: Sequelae  of right cerebellar mass resection are again identified. Mild enhancement at the resection site is predominantly curvilinear in configuration. No masslike or clearly suspicious enhancement is evident. Cerebellar edema has resolved. No new enhancing intracranial lesion, acute infarct, acute hemorrhage, or midline shift is identified. The ventricles are normal in size. A small extra-axial fluid collection at the craniectomy site has mildly decreased in size. A small portion of the posterior right cerebellar hemisphere herniates into the craniectomy defect. Vascular: Major intracranial vascular flow voids are preserved. Skull and upper cervical spine: Right suboccipital craniectomy. No suspicious marrow lesion. Sinuses/Orbits: Unremarkable orbits. Paranasal sinuses and mastoid air cells are clear. Moderate right mastoid effusion. Other: None. IMPRESSION: 1. Sequelae of right cerebellar mass resection with resolved edema and no suspicious enhancement. 2. No evidence of new intracranial metastases. Electronically Signed   By: Sebastian Ache M.D.   On: 01/13/2024 17:45   CT Chest Wo Contrast Result Date: 12/29/2023  CLINICAL DATA:  Metastatic renal cell carcinoma. * Tracking Code: BO * EXAM: CT CHEST, ABDOMEN AND PELVIS WITHOUT CONTRAST TECHNIQUE: Multidetector CT imaging of the chest, abdomen and pelvis was performed following the standard protocol without IV contrast. RADIATION DOSE REDUCTION: This exam was performed according to the departmental dose-optimization program which includes automated exposure control, adjustment of the mA and/or kV according to patient size and/or use of iterative reconstruction technique. COMPARISON:  09/30/2023. FINDINGS: CT CHEST FINDINGS Cardiovascular: Atherosclerotic calcification of the aorta. Heart is at the upper limits of normal in size to mildly enlarged. No pericardial effusion. Mediastinum/Nodes: No pathologically enlarged mediastinal or axillary lymph nodes. Hilar regions are  difficult to definitively evaluate without IV contrast. Esophagus is grossly unremarkable. Lungs/Pleura: A few basilar pulmonary nodules measure up to 4 mm in the right lower lobe, stable. No pleural fluid. Airway is unremarkable. Musculoskeletal: Lytic metastases at the junction of the right first rib and manubrium, 4.6 x 7.0 cm (2/29), and in the T9 vertebral body, 2.4 x 2.8 cm (2/99). CT ABDOMEN PELVIS FINDINGS Hepatobiliary: Hepatic cysts. Liver and gallbladder are otherwise unremarkable. No biliary ductal dilatation. Pancreas: Negative. Spleen: Negative. Adrenals/Urinary Tract: Adrenal glands are unremarkable. Right nephrectomy. Centrally fat density lesion along the lateral interpolar left kidney measures 1.7 cm, stable, indicative of previous cryoablation. Tiny left renal stone. Left ureter is decompressed. Bladder is low in volume. Stomach/Bowel: Stomach, small bowel, appendix and colon are unremarkable. Vascular/Lymphatic: Vascular structures are unremarkable. No pathologically enlarged lymph nodes. Periportal lymph nodes measure up to 12 mm, unchanged. Reproductive: Prostate is visualized. Other: No free fluid. Small right lateral pelvic wall hernia contains fat. Mesenteries and peritoneum are unremarkable. Musculoskeletal: Degenerative changes in the spine. No additional lytic lesions. IMPRESSION: 1. Metastatic disease involving the junction of the right first rib and manubrium as well as T9 vertebral body. 2. Tiny basilar pulmonary nodules, similar.  Tiny left renal stone. 3.  Aortic atherosclerosis (ICD10-I70.0). Electronically Signed   By: Leanna Battles M.D.   On: 12/29/2023 14:56   CT ABDOMEN PELVIS WO CONTRAST Result Date: 12/29/2023 CLINICAL DATA:  Metastatic renal cell carcinoma. * Tracking Code: BO * EXAM: CT CHEST, ABDOMEN AND PELVIS WITHOUT CONTRAST TECHNIQUE: Multidetector CT imaging of the chest, abdomen and pelvis was performed following the standard protocol without IV contrast.  RADIATION DOSE REDUCTION: This exam was performed according to the departmental dose-optimization program which includes automated exposure control, adjustment of the mA and/or kV according to patient size and/or use of iterative reconstruction technique. COMPARISON:  09/30/2023. FINDINGS: CT CHEST FINDINGS Cardiovascular: Atherosclerotic calcification of the aorta. Heart is at the upper limits of normal in size to mildly enlarged. No pericardial effusion. Mediastinum/Nodes: No pathologically enlarged mediastinal or axillary lymph nodes. Hilar regions are difficult to definitively evaluate without IV contrast. Esophagus is grossly unremarkable. Lungs/Pleura: A few basilar pulmonary nodules measure up to 4 mm in the right lower lobe, stable. No pleural fluid. Airway is unremarkable. Musculoskeletal: Lytic metastases at the junction of the right first rib and manubrium, 4.6 x 7.0 cm (2/29), and in the T9 vertebral body, 2.4 x 2.8 cm (2/99). CT ABDOMEN PELVIS FINDINGS Hepatobiliary: Hepatic cysts. Liver and gallbladder are otherwise unremarkable. No biliary ductal dilatation. Pancreas: Negative. Spleen: Negative. Adrenals/Urinary Tract: Adrenal glands are unremarkable. Right nephrectomy. Centrally fat density lesion along the lateral interpolar left kidney measures 1.7 cm, stable, indicative of previous cryoablation. Tiny left renal stone. Left ureter is decompressed. Bladder is low in volume. Stomach/Bowel: Stomach, small bowel, appendix  and colon are unremarkable. Vascular/Lymphatic: Vascular structures are unremarkable. No pathologically enlarged lymph nodes. Periportal lymph nodes measure up to 12 mm, unchanged. Reproductive: Prostate is visualized. Other: No free fluid. Small right lateral pelvic wall hernia contains fat. Mesenteries and peritoneum are unremarkable. Musculoskeletal: Degenerative changes in the spine. No additional lytic lesions. IMPRESSION: 1. Metastatic disease involving the junction of the right  first rib and manubrium as well as T9 vertebral body. 2. Tiny basilar pulmonary nodules, similar.  Tiny left renal stone. 3.  Aortic atherosclerosis (ICD10-I70.0). Electronically Signed   By: Leanna Battles M.D.   On: 12/29/2023 14:56    Impression/Plan: 1.  58 y.o. man with a solitary brain metastasis and osseous metastases at T9 and in the manubrium, secondary to Stage IV clear cell renal cell carcinoma. He appears to have recovered well from the effects of his recent preop SRS brain treatment and SBRT of the osseous metastases and is currently without complaints.  We reviewed the findings from his recent posttreatment MRI brain scan, performed on 01/13/2024, which is without any concerning findings for new intracranial metastasis or residual disease.  Therefore, we discussed the plan to proceed with serial MRI brain scans every 3 months to continue to monitor for any evidence of disease progression or recurrence.  I will plan to connect with him by telephone following each scan to review results and recommendations.  He will also continue in routine follow-up under the care and direction of Dr. Shirline Frees for continued management of his systemic disease.  He knows that he is welcome to call at anytime in the interim with any questions or concerns related to his previous radiation treatments.  He appears to have a good understanding of these recommendations and is comfortable and in agreement with the stated plan.  I personally spent 30 minutes in this encounter including chart review, reviewing radiological studies, telephone conversation with the patient, entering orders, coordinating care and completing documentation.    Marguarite Arbour, PA-C

## 2024-01-21 NOTE — Progress Notes (Signed)
 Telephone nursing appointment for review of most recent MRI results. I verified patient's identity x2 and began nursing interview.   Patient reports doing well. Patient denies any other related issues at this time.   Meaningful use complete.   Patient aware of their 11:30am- 01/21/24 telephone appointment w/ Ashlyn Bruning PA-C. I left my extension [phone] in case patient needs anything. Patient verbalized understanding. This concludes the nursing interview.   Patient contact [phone]     Ruel Favors, LPN

## 2024-01-22 ENCOUNTER — Encounter (HOSPITAL_COMMUNITY)
Admission: RE | Admit: 2024-01-22 | Discharge: 2024-01-22 | Disposition: A | Discharge status: Home / Self Care | Source: Ambulatory Visit | Attending: Physician Assistant | Admitting: Physician Assistant

## 2024-01-22 ENCOUNTER — Other Ambulatory Visit: Payer: Self-pay

## 2024-01-22 DIAGNOSIS — C7951 Secondary malignant neoplasm of bone: Secondary | ICD-10-CM | POA: Diagnosis not present

## 2024-01-22 DIAGNOSIS — C7900 Secondary malignant neoplasm of unspecified kidney and renal pelvis: Secondary | ICD-10-CM | POA: Diagnosis not present

## 2024-01-22 DIAGNOSIS — C649 Malignant neoplasm of unspecified kidney, except renal pelvis: Secondary | ICD-10-CM | POA: Insufficient documentation

## 2024-01-22 DIAGNOSIS — Z85528 Personal history of other malignant neoplasm of kidney: Secondary | ICD-10-CM | POA: Diagnosis not present

## 2024-01-22 DIAGNOSIS — C7931 Secondary malignant neoplasm of brain: Secondary | ICD-10-CM | POA: Diagnosis not present

## 2024-01-22 LAB — GLUCOSE, CAPILLARY: Glucose-Capillary: 105 mg/dL — ABNORMAL HIGH (ref 70–99)

## 2024-01-22 MED ORDER — FLUDEOXYGLUCOSE F - 18 (FDG) INJECTION
13.0000 | Freq: Once | INTRAVENOUS | Status: AC | PRN
  Administered 2024-01-22: 12.9 via INTRAVENOUS

## 2024-01-23 ENCOUNTER — Telehealth: Payer: Self-pay | Admitting: Medical Oncology

## 2024-01-23 ENCOUNTER — Encounter: Payer: Self-pay | Admitting: Medical Oncology

## 2024-01-23 NOTE — Telephone Encounter (Signed)
 BRB on toilet paper after BM this am . This is the first time he has experienced this. He denies blood in stool. He reported he normally has 2-3 BM /day and today is stool was harder than normal. Denies abd /rectal pain/dizziness/lightheadedness.  I told  him to monitor for now and to call for any increased episodes or other concerns.

## 2024-01-25 DIAGNOSIS — G4733 Obstructive sleep apnea (adult) (pediatric): Secondary | ICD-10-CM | POA: Diagnosis not present

## 2024-01-27 ENCOUNTER — Other Ambulatory Visit (HOSPITAL_COMMUNITY): Payer: Self-pay

## 2024-01-27 ENCOUNTER — Inpatient Hospital Stay: Payer: BC Managed Care – PPO

## 2024-01-27 ENCOUNTER — Inpatient Hospital Stay (HOSPITAL_BASED_OUTPATIENT_CLINIC_OR_DEPARTMENT_OTHER): Payer: BC Managed Care – PPO | Admitting: Physician Assistant

## 2024-01-27 ENCOUNTER — Encounter: Payer: Self-pay | Admitting: Internal Medicine

## 2024-01-27 VITALS — BP 129/98 | HR 72 | Temp 98.1°F | Resp 13 | Wt 256.2 lb

## 2024-01-27 DIAGNOSIS — Z880 Allergy status to penicillin: Secondary | ICD-10-CM | POA: Diagnosis not present

## 2024-01-27 DIAGNOSIS — C649 Malignant neoplasm of unspecified kidney, except renal pelvis: Secondary | ICD-10-CM | POA: Diagnosis not present

## 2024-01-27 DIAGNOSIS — C7951 Secondary malignant neoplasm of bone: Secondary | ICD-10-CM | POA: Diagnosis not present

## 2024-01-27 DIAGNOSIS — Z79890 Hormone replacement therapy (postmenopausal): Secondary | ICD-10-CM | POA: Diagnosis not present

## 2024-01-27 DIAGNOSIS — Z5112 Encounter for antineoplastic immunotherapy: Secondary | ICD-10-CM | POA: Diagnosis not present

## 2024-01-27 DIAGNOSIS — C7800 Secondary malignant neoplasm of unspecified lung: Secondary | ICD-10-CM | POA: Diagnosis not present

## 2024-01-27 DIAGNOSIS — Z9226 Personal history of immune checkpoint inhibitor therapy: Secondary | ICD-10-CM | POA: Diagnosis not present

## 2024-01-27 DIAGNOSIS — C7931 Secondary malignant neoplasm of brain: Secondary | ICD-10-CM | POA: Diagnosis not present

## 2024-01-27 DIAGNOSIS — Z881 Allergy status to other antibiotic agents status: Secondary | ICD-10-CM | POA: Diagnosis not present

## 2024-01-27 DIAGNOSIS — K7689 Other specified diseases of liver: Secondary | ICD-10-CM | POA: Diagnosis not present

## 2024-01-27 DIAGNOSIS — C641 Malignant neoplasm of right kidney, except renal pelvis: Secondary | ICD-10-CM | POA: Diagnosis not present

## 2024-01-27 DIAGNOSIS — M545 Low back pain, unspecified: Secondary | ICD-10-CM | POA: Diagnosis not present

## 2024-01-27 DIAGNOSIS — Z905 Acquired absence of kidney: Secondary | ICD-10-CM | POA: Diagnosis not present

## 2024-01-27 DIAGNOSIS — I700 Atherosclerosis of aorta: Secondary | ICD-10-CM | POA: Diagnosis not present

## 2024-01-27 DIAGNOSIS — Z79899 Other long term (current) drug therapy: Secondary | ICD-10-CM | POA: Diagnosis not present

## 2024-01-27 DIAGNOSIS — G939 Disorder of brain, unspecified: Secondary | ICD-10-CM | POA: Diagnosis not present

## 2024-01-27 DIAGNOSIS — K754 Autoimmune hepatitis: Secondary | ICD-10-CM | POA: Diagnosis not present

## 2024-01-27 LAB — CBC WITH DIFFERENTIAL (CANCER CENTER ONLY)
Abs Immature Granulocytes: 0.01 10*3/uL (ref 0.00–0.07)
Basophils Absolute: 0 10*3/uL (ref 0.0–0.1)
Basophils Relative: 1 %
Eosinophils Absolute: 1 10*3/uL — ABNORMAL HIGH (ref 0.0–0.5)
Eosinophils Relative: 21 %
HCT: 43.1 % (ref 39.0–52.0)
Hemoglobin: 14.7 g/dL (ref 13.0–17.0)
Immature Granulocytes: 0 %
Lymphocytes Relative: 14 %
Lymphs Abs: 0.7 10*3/uL (ref 0.7–4.0)
MCH: 26.2 pg (ref 26.0–34.0)
MCHC: 34.1 g/dL (ref 30.0–36.0)
MCV: 76.8 fL — ABNORMAL LOW (ref 80.0–100.0)
Monocytes Absolute: 0.4 10*3/uL (ref 0.1–1.0)
Monocytes Relative: 10 %
Neutro Abs: 2.5 10*3/uL (ref 1.7–7.7)
Neutrophils Relative %: 54 %
Platelet Count: 123 10*3/uL — ABNORMAL LOW (ref 150–400)
RBC: 5.61 MIL/uL (ref 4.22–5.81)
RDW: 15.5 % (ref 11.5–15.5)
WBC Count: 4.6 10*3/uL (ref 4.0–10.5)
nRBC: 0 % (ref 0.0–0.2)

## 2024-01-27 LAB — CMP (CANCER CENTER ONLY)
ALT: 92 U/L — ABNORMAL HIGH (ref 0–44)
AST: 71 U/L — ABNORMAL HIGH (ref 15–41)
Albumin: 4.4 g/dL (ref 3.5–5.0)
Alkaline Phosphatase: 130 U/L — ABNORMAL HIGH (ref 38–126)
Anion gap: 7 (ref 5–15)
BUN: 13 mg/dL (ref 6–20)
CO2: 27 mmol/L (ref 22–32)
Calcium: 9.3 mg/dL (ref 8.9–10.3)
Chloride: 104 mmol/L (ref 98–111)
Creatinine: 1.23 mg/dL (ref 0.61–1.24)
GFR, Estimated: >60 mL/min (ref >60)
Glucose, Bld: 100 mg/dL — ABNORMAL HIGH (ref 70–99)
Potassium: 3.9 mmol/L (ref 3.5–5.1)
Sodium: 138 mmol/L (ref 135–145)
Total Bilirubin: 0.7 mg/dL (ref 0.0–1.2)
Total Protein: 7.7 g/dL (ref 6.5–8.1)

## 2024-01-27 MED ORDER — METHYLPREDNISOLONE 4 MG PO TBPK
ORAL_TABLET | ORAL | 0 refills | Status: DC
Start: 2024-01-27 — End: 2024-02-10
  Filled 2024-01-27: qty 21, 6d supply, fill #0

## 2024-01-27 MED ORDER — MAGIC MOUTHWASH: 5.0000 mL | Freq: Three times a day (TID) | ORAL | 0 refills | Status: DC | PRN

## 2024-01-27 MED ORDER — MAGIC MOUTHWASH
5.0000 mL | Freq: Three times a day (TID) | ORAL | 0 refills | Status: DC | PRN
Start: 2024-01-27 — End: 2024-01-27

## 2024-01-27 MED ORDER — NYSTATIN 100000 UNIT/ML MT SUSP
5.0000 mL | Freq: Three times a day (TID) | OROMUCOSAL | 0 refills | Status: DC | PRN
  Filled 2024-01-27: qty 210, 14d supply, fill #0

## 2024-01-28 ENCOUNTER — Telehealth: Payer: Self-pay | Admitting: Medical Oncology

## 2024-01-28 NOTE — Telephone Encounter (Signed)
 Second episode in 6 days of small amount of BRB blood in the toilet water, The water turned  pink . Small amount of blood on tissue . He did not see any blood on stool.  Denies itching or burning in anus or hx hemorrhoids. Some hard stool with soft stool. He has BM bid.

## 2024-02-04 NOTE — Progress Notes (Signed)
 [facility] OFFICE PROGRESS NOTE  Michael Corn, MD [address] [place] [address]  DIAGNOSIS: Stage IV clear-cell renal cell carcinoma initially diagnosed as locally advanced disease in 2020 with evidence of pulmonary metastasis in 2021.   PRIOR THERAPY: 1) status post right nephrectomy on August 27, 2019 in [place] and the final pathology showed clear-cell renal cell carcinoma nuclear grade 3 with 10% necrosis and negative margin with the final pathologic stage of T2a, NX. 2) status post treatment with Keytruda and axitinib started March 2021 for 2 cycles interrupted because of autoimmune hepatitis.  3) axitinib 5 mg daily started July 05, 2020 discontinued after 1 week because of elevated LFTs. 4) status post cryoablation of left renal mass on May 30, 2021. 5) Keytruda 200 Mg IV every 3 weeks started July 27, 2021 and then switch it to 400 Mg every 6 weeks on November 09, 2021.  Last dose was given July 2024. 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 Conchita Paris  CURRENT THERAPY: 1) Opdivo 480 mg IV every 4 weeks and Cabometyx 40 mg p.o. daily first dose on 12/30/23. Status post 1 cycle of treatment.  2) SBRT to the right chest wall/sternum and T9 under the care of Dr. Kathrynn Running starting next week. Last dose on 01/16/24 3) Xgeva once the patient obtains dental clearance   INTERVAL HISTORY: Michael [name] 58 y.o. male returns to the clinic today for a follow-up visit accompanied by his wife.  She was last seen by Dr. Arbutus Ped and myself on 01/27/2024.  At that point time I reviewed his PET scan. We also reviewed his LFTs.  The patient had a history of elevated LFTs when he was on Inlyta in the past when he lived in [place]. Last week his LFTs were still slightly elevated. Dr. Arbutus Ped recommended holding his nivolumab.  If no improvement in his LFTs at this  visit, then we would consider holding his Cabometyx.   At his appointment last week he was also endorsing stomatitis.  He was given Magic mouthwash which actually burned.  The burning in his mouth is getting worse without any oral lesions.  He is also having some rash along his face, dry scalp, brittle hair, increased pallor, and some cracking on his hands and feet.  Due to the stomatitis he has lost 3 pounds since last being seen.  He continues to avoid alcohol or Tylenol.  He denies any fever, chills, or night sweats.  He denies any unusual shortness of breath, cough, or hemoptysis.  His bowel habits have returned to normal.  He was previously endorsing mild right red blood on the toilet paper and toilet bowl.  He never had a colonoscopy.  After discussion today he would be open to establishing care with GI.  He denies any nausea, vomiting, diarrhea, or constipation.  Today for repeat blood work and for more detailed discussion about his current condition considering cycle #2.    MEDICAL HISTORY: Past Medical History:  Diagnosis Date   Cancer [facility])    Renal cell carcinoma   Clear cell renal cell carcinoma (HCC)    Hypertension    Hypothyroidism    Sleep apnea    wears cpap    ALLERGIES:  is allergic to cleocin [clindamycin], penicillins, and tape.  MEDICATIONS:  Current Outpatient Medications  Medication Sig Dispense Refill   amLODipine (NORVASC) 5 MG tablet  Take 7.5 mg by mouth daily before breakfast.     cabozantinib (CABOMETYX) 40 MG tablet Take 1 tablet (40 mg total) by mouth daily. Take on an empty stomach, 1 hour before or 2 hours after meals. 30 tablet 3   cyclobenzaprine (FLEXERIL) 10 MG tablet Take 1 tablet po 2x a day prn Orally as directed for 30 days     doxycycline (VIBRA-TABS) 100 MG tablet Take 1 tablet (100 mg total) by mouth 2 (two) times daily. (Patient not taking: Reported on 01/15/2024) 20 tablet 0   fluconazole (DIFLUCAN) 150 MG tablet Take 150 mg by mouth daily.  (Patient not taking: Reported on 01/15/2024)     levothyroxine (SYNTHROID) 137 MCG tablet TAKE 1 TABLET(137 MCG) BY MOUTH DAILY BEFORE BREAKFAST 30 tablet 2   LORazepam (ATIVAN) 0.5 MG tablet 1 tablet p.o. 30 minutes before the PET scan repeat x 1 if needed. 2 tablet 0   magic mouthwash (nystatin, diphenhydrAMINE, alum & mag hydroxide) suspension mixture Take 5 mLs by mouth 3 (three) times daily as needed for mouth pain. (Suspension contains equal amounts of Maalox Extra Strength, nystatin, and diphenhydramine.) 210 mL 0   methocarbamol (ROBAXIN) 500 MG tablet Take 500 mg by mouth every 8 (eight) hours as needed for muscle spasms. (Patient not taking: Reported on 01/15/2024)     methylPREDNISolone (MEDROL DOSEPAK) 4 MG TBPK tablet Use as directed on package 21 tablet 0   montelukast (SINGULAIR) 10 MG tablet Take 10 mg by mouth daily before breakfast.     nystatin (MYCOSTATIN) 100000 UNIT/ML suspension Take by mouth. (Patient not taking: Reported on 01/15/2024)     ondansetron (ZOFRAN) 8 MG tablet Take 1 tablet (8 mg total) by mouth every 8 (eight) hours as needed for nausea or vomiting. (Patient not taking: Reported on 01/15/2024) 30 tablet 1   prochlorperazine (COMPAZINE) 10 MG tablet Take 1 tablet (10 mg total) by mouth every 6 (six) hours as needed for nausea or vomiting. (Patient not taking: Reported on 01/15/2024) 30 tablet 1   tadalafil (CIALIS) 20 MG tablet Take 10-20 mg by mouth daily as needed for erectile dysfunction.     No current facility-administered medications for this visit.    SURGICAL HISTORY:  Past Surgical History:  Procedure Laterality Date   APPLICATION OF CRANIAL NAVIGATION N/A 10/13/2023   Procedure: APPLICATION OF CRANIAL NAVIGATION;  Surgeon: Lisbeth Renshaw, MD;  Location: [facility] OR;  Service: Neurosurgery;  Laterality: N/A;   CRANIOTOMY N/A 10/13/2023   Procedure: SUBOCCIPITAL   CRANIOTOMY TUMOR EXCISION;  Surgeon: Lisbeth Renshaw, MD;  Location: [facility] OR;  Service:  Neurosurgery;  Laterality: N/A;   IR RADIOLOGIST EVAL & MGMT  04/04/2021   no procedures performed   IR RADIOLOGIST EVAL & MGMT  05/08/2021   no procedures performed   IR RADIOLOGIST EVAL & MGMT  08/12/2022   LUNG BIOPSY  2021   RADIOLOGY WITH ANESTHESIA N/A 05/30/2021   Procedure: CT WITH ANESTHESIA CRYOABLATION;  Surgeon: Irish Lack, MD;  Location: [facility] ORS;  Service: Radiology;  Laterality: N/A;   right nephrectomy Right 08/2019   TONSILLECTOMY      REVIEW OF SYSTEMS:   Constitutional: Negative for appetite change, chills, fatigue, fever and unexpected weight change.  HENT: Positive for some dry mouth and soreness. Negative for mouth sores, nosebleeds, sore throat and trouble swallowing.   Eyes: Negative for eye problems and icterus.  Respiratory: Negative for cough, hemoptysis, shortness of breath and wheezing.   Cardiovascular: Negative for chest pain and leg  swelling.  Gastrointestinal: Negative for abdominal pain, constipation, diarrhea, nausea and vomiting.  Genitourinary: Negative for bladder incontinence, difficulty urinating, dysuria, frequency and hematuria.   Musculoskeletal: Improving metastatic lesion over sternum. Negative for back pain, gait problem, neck pain and neck stiffness.  Skin: Positive for rash/skin lesions on back.  Neurological: Negative for dizziness, extremity weakness, gait problem, headaches, light-headedness and seizures.  Hematological: Negative for adenopathy. Does not bruise/bleed easily.  Psychiatric/Behavioral: Negative for confusion, depression and sleep disturbance. The patient is not nervous/anxious.      PHYSICAL EXAMINATION:  There were no vitals taken for this visit.  ECOG PERFORMANCE STATUS: 1  Physical Exam  Constitutional: Oriented to person, place, and time and well-developed, well-nourished, and in no distress.  HENT:  Head: Normocephalic and atraumatic.  Mouth/Throat: Oropharynx is clear and moist. No oropharyngeal exudate.   Eyes: Conjunctivae are normal. Right eye exhibits no discharge. Left eye exhibits no discharge. No scleral icterus.  Neck: Normal range of motion. Neck supple.  Cardiovascular: Normal rate, regular rhythm, normal heart sounds and intact distal pulses.   Pulmonary/Chest: Effort normal and breath sounds normal. No respiratory distress. No wheezes. No rales.  Abdominal: Soft. Bowel sounds are normal. Exhibits no distension and no mass. There is no tenderness.  Musculoskeletal: Normal range of motion. Exhibits no edema.  Lymphadenopathy:    No cervical adenopathy.  Neurological: Alert and oriented to person, place, and time. Exhibits normal muscle tone. Gait normal. Coordination normal.  Skin: Skin is warm and dry. Positive for rash. Not diaphoretic. No erythema. No pallor.  Psychiatric: Mood, memory and judgment normal.  Vitals reviewed.  LABORATORY DATA: Lab Results  Component Value Date   WBC 4.6 01/27/2024   HGB 14.7 01/27/2024   HCT 43.1 01/27/2024   MCV 76.8 (L) 01/27/2024   PLT 123 (L) 01/27/2024      Chemistry      Component Value Date/Time   NA 138 01/27/2024 1101   K 3.9 01/27/2024 1101   CL 104 01/27/2024 1101   CO2 27 01/27/2024 1101   BUN 13 01/27/2024 1101   CREATININE 1.23 01/27/2024 1101      Component Value Date/Time   CALCIUM 9.3 01/27/2024 1101   ALKPHOS 130 (H) 01/27/2024 1101   AST 71 (H) 01/27/2024 1101   ALT 92 (H) 01/27/2024 1101   BILITOT 0.7 01/27/2024 1101       RADIOGRAPHIC STUDIES:  NM PET Image Restage (PS) Skull Base to Thigh (F-18 FDG) Result Date: 01/27/2024 CLINICAL DATA:  Subsequent treatment strategy for metastatic renal cell carcinoma. EXAM: NUCLEAR MEDICINE PET SKULL BASE TO THIGH TECHNIQUE: 12.9 mCi F-18 FDG was injected intravenously. Full-ring PET imaging was performed from the skull base to thigh after the radiotracer. CT data was obtained and used for attenuation correction and anatomic localization. Fasting blood glucose: 105  mg/dl COMPARISON:  CT 16/08/9603 FINDINGS: NECK: No hypermetabolic lymph nodes in the neck. Incidental CT findings: None. CHEST: No hypermetabolic mediastinal lymph nodes. No suspicious pulmonary nodules. No axillary or supraclavicular adenopathy. Incidental CT findings: None. ABDOMEN/PELVIS: Low-density lesions in the liver without radiotracer activity consistent benign cysts. No hypermetabolic abdominal or pelvic lymph nodes. Post RIGHT nephrectomy. Incidental CT findings: None. SKELETON: Hypermetabolic lesion at the T9 vertebral body. Lesion has ruptured through the anterior cortex with soft tissue extension into the paraspinal space. Lesion measures 2.7 x 2.2 cm on image 91 with intense metabolic activity (SUV max equal 7.0). Second skeletal lesion at the junction of the manubrium and first rib.  This is expansile soft tissue lesion centered in the bone measuring 5.3 x 3.7 cm with SUV max equal 6.9 on image 52. Incidental CT findings: None. IMPRESSION: 1. Hypermetabolic skeletal metastasis at the T9 vertebral body and at the junction of the manubrium and first rib. 2. No evidence of visceral metastasis or nodal metastasis. 3. Post RIGHT nephrectomy. Electronically Signed   By: Genevive Bi M.D.   On: 01/27/2024 11:13   MR Brain W Wo Contrast Result Date: 01/13/2024 CLINICAL DATA:  Brain metastases, assess treatment response 3T SRS Protocol. Follow-up of treated brain met. Metastatic clear cell renal cell carcinoma. History of cerebellar metastasis status post SRS and resection in 10/2023. EXAM: MRI HEAD WITHOUT AND WITH CONTRAST TECHNIQUE: Multiplanar, multiecho pulse sequences of the brain and surrounding structures were obtained without and with intravenous contrast. CONTRAST:  10mL GADAVIST GADOBUTROL 1 MMOL/ML IV SOLN COMPARISON:  Head MRI 10/14/2023 FINDINGS: Brain: Sequelae of right cerebellar mass resection are again identified. Mild enhancement at the resection site is predominantly curvilinear in  configuration. No masslike or clearly suspicious enhancement is evident. Cerebellar edema has resolved. No new enhancing intracranial lesion, acute infarct, acute hemorrhage, or midline shift is identified. The ventricles are normal in size. A small extra-axial fluid collection at the craniectomy site has mildly decreased in size. A small portion of the posterior right cerebellar hemisphere herniates into the craniectomy defect. Vascular: Major intracranial vascular flow voids are preserved. Skull and upper cervical spine: Right suboccipital craniectomy. No suspicious marrow lesion. Sinuses/Orbits: Unremarkable orbits. Paranasal sinuses and mastoid air cells are clear. Moderate right mastoid effusion. Other: None. IMPRESSION: 1. Sequelae of right cerebellar mass resection with resolved edema and no suspicious enhancement. 2. No evidence of new intracranial metastases. Electronically Signed   By: Sebastian Ache M.D.   On: 01/13/2024 17:45     ASSESSMENT/PLAN:  This is a very pleasant 58 year old male with stage IV clear-cell renal cell carcinoma. This was initially diagnosed with locally advanced disease in 2020. He had evidence of pulmonary metastases in 2021. He is status post right nephrectomy on 10/23/202020 in [place]. The final pathology showed clear-cell renal cell carcinoma nuclear grade 3 with 10% necrosis and negative margins. The final pathologic stage was T2a, NX.    The patient started treatment with Keytruda and Axitinib that was started in March 2021.  He had 2 cycles of treatment but it was interrupted due to autoimmune hepatitis.  He then started on 5 mg daily of axitinib on 07/05/2020 but this was also discontinued after 1 week due to elevated LFTs.   He underwent cryoablation to the left renal mass on 05/30/2021.   He then underwent treatment with Keytruda.  He was initially on 200 mg IV every 3 weeks on 07/27/2021 but then was switched to 400 mg every 6 weeks starting from 11/09/2021.   He  was found to have a solitary right cerebellar brain lesion in November 2024 and underwent SRS on 10/10/2023 followed by craniotomy on 10/13/2023.   He was recently found to have disease progression and February 2024 with a lytic lesion in the upper sternum.  The patient is planning to to this lesion under the care of Dr. Kathrynn Running as well as the T9 lesion.  The last day radiation is scheduled for 01/16/24   The patient was started back on treatment and he is status post his first cycle of IV nivolumab 480 mg every 4 weeks in addition to his oral treatment with Cabometyx 40 mg p.o.  daily.   The patient has been struggling with elevated LFTs.  At his last appointment Dr. Arbutus Ped recommended holding the nivolumab and continuing cabometyx only.  He continues to have elevated LFTs, then we would suspect that the elevated LFTs are secondary to the [place] and will hold the cabo.   The patient's labs resulted today.  I reviewed them with Dr. Arbutus Ped.  His LFTs have worsened.  His ALT is 242 and his AST is 171.  Therefore, it is likely the cabometyx   Therefore, the patient will hold his cabometyx at this time.  We will also hold his nivolumab today due to his LFTs.  We will then see him in 2 weeks for evaluation and repeat blood work.  If he has improvement in his LFTs he will proceed with nivolumab.  We will decide what to do regarding the cabometyx based on his repeat labs in 2 weeks.   He will use head and shoulders for his dry scalp.  He will use anti-itch cream for the pruritic rash.  He was advised to use lotion on his hands with Eucerin or urea cream.  Advised to use salt water rinses for his stomatitis.   Hopefully his symptoms will improve with holding the cabometyx   I also refilled his Medrol Dosepak.   Will place referral to [facility] for routine colonoscopy.   The patient was advised to call immediately if he has any concerning symptoms in the interval. The patient voices understanding of  current disease status and treatment options and is in agreement with the current care plan. All questions were answered. The patient knows to call the clinic with any problems, questions or concerns. We can certainly see the patient much sooner if necessary    No orders of the defined types were placed in this encounter.    The total time spent in the appointment was 30-39 minutes  Keithen Capo L Leasha Goldberger, PA-C 02/04/24

## 2024-02-10 ENCOUNTER — Ambulatory Visit: Admitting: Physician Assistant

## 2024-02-10 ENCOUNTER — Inpatient Hospital Stay

## 2024-02-10 ENCOUNTER — Inpatient Hospital Stay: Attending: Oncology

## 2024-02-10 ENCOUNTER — Inpatient Hospital Stay (HOSPITAL_BASED_OUTPATIENT_CLINIC_OR_DEPARTMENT_OTHER): Admitting: Physician Assistant

## 2024-02-10 ENCOUNTER — Other Ambulatory Visit

## 2024-02-10 VITALS — BP 144/98 | HR 78 | Temp 98.1°F | Resp 14 | Wt 253.8 lb

## 2024-02-10 DIAGNOSIS — C641 Malignant neoplasm of right kidney, except renal pelvis: Secondary | ICD-10-CM | POA: Insufficient documentation

## 2024-02-10 DIAGNOSIS — C649 Malignant neoplasm of unspecified kidney, except renal pelvis: Secondary | ICD-10-CM | POA: Diagnosis not present

## 2024-02-10 DIAGNOSIS — K922 Gastrointestinal hemorrhage, unspecified: Secondary | ICD-10-CM | POA: Insufficient documentation

## 2024-02-10 DIAGNOSIS — K625 Hemorrhage of anus and rectum: Secondary | ICD-10-CM

## 2024-02-10 DIAGNOSIS — Z905 Acquired absence of kidney: Secondary | ICD-10-CM | POA: Insufficient documentation

## 2024-02-10 DIAGNOSIS — Z881 Allergy status to other antibiotic agents status: Secondary | ICD-10-CM | POA: Diagnosis not present

## 2024-02-10 DIAGNOSIS — R682 Dry mouth, unspecified: Secondary | ICD-10-CM | POA: Diagnosis not present

## 2024-02-10 DIAGNOSIS — R7989 Other specified abnormal findings of blood chemistry: Secondary | ICD-10-CM | POA: Diagnosis not present

## 2024-02-10 DIAGNOSIS — Z5112 Encounter for antineoplastic immunotherapy: Secondary | ICD-10-CM | POA: Diagnosis not present

## 2024-02-10 DIAGNOSIS — C7800 Secondary malignant neoplasm of unspecified lung: Secondary | ICD-10-CM | POA: Diagnosis not present

## 2024-02-10 DIAGNOSIS — R21 Rash and other nonspecific skin eruption: Secondary | ICD-10-CM | POA: Insufficient documentation

## 2024-02-10 DIAGNOSIS — R231 Pallor: Secondary | ICD-10-CM | POA: Diagnosis not present

## 2024-02-10 DIAGNOSIS — Z880 Allergy status to penicillin: Secondary | ICD-10-CM | POA: Insufficient documentation

## 2024-02-10 DIAGNOSIS — C7931 Secondary malignant neoplasm of brain: Secondary | ICD-10-CM | POA: Diagnosis not present

## 2024-02-10 DIAGNOSIS — Z79899 Other long term (current) drug therapy: Secondary | ICD-10-CM | POA: Diagnosis not present

## 2024-02-10 DIAGNOSIS — Z9226 Personal history of immune checkpoint inhibitor therapy: Secondary | ICD-10-CM | POA: Diagnosis not present

## 2024-02-10 DIAGNOSIS — C7951 Secondary malignant neoplasm of bone: Secondary | ICD-10-CM | POA: Insufficient documentation

## 2024-02-10 LAB — CBC WITH DIFFERENTIAL (CANCER CENTER ONLY)
Abs Immature Granulocytes: 0.02 10*3/uL (ref 0.00–0.07)
Basophils Absolute: 0 10*3/uL (ref 0.0–0.1)
Basophils Relative: 1 %
Eosinophils Absolute: 0.8 10*3/uL — ABNORMAL HIGH (ref 0.0–0.5)
Eosinophils Relative: 15 %
HCT: 46 % (ref 39.0–52.0)
Hemoglobin: 15.6 g/dL (ref 13.0–17.0)
Immature Granulocytes: 0 %
Lymphocytes Relative: 17 %
Lymphs Abs: 0.9 10*3/uL (ref 0.7–4.0)
MCH: 26.6 pg (ref 26.0–34.0)
MCHC: 33.9 g/dL (ref 30.0–36.0)
MCV: 78.5 fL — ABNORMAL LOW (ref 80.0–100.0)
Monocytes Absolute: 0.4 10*3/uL (ref 0.1–1.0)
Monocytes Relative: 8 %
Neutro Abs: 3 10*3/uL (ref 1.7–7.7)
Neutrophils Relative %: 59 %
Platelet Count: 200 10*3/uL (ref 150–400)
RBC: 5.86 MIL/uL — ABNORMAL HIGH (ref 4.22–5.81)
RDW: 17.2 % — ABNORMAL HIGH (ref 11.5–15.5)
WBC Count: 5 10*3/uL (ref 4.0–10.5)
nRBC: 0 % (ref 0.0–0.2)

## 2024-02-10 LAB — CMP (CANCER CENTER ONLY)
ALT: 242 U/L — ABNORMAL HIGH (ref 0–44)
AST: 172 U/L (ref 15–41)
Albumin: 4.4 g/dL (ref 3.5–5.0)
Alkaline Phosphatase: 114 U/L (ref 38–126)
Anion gap: 8 (ref 5–15)
BUN: 8 mg/dL (ref 6–20)
CO2: 26 mmol/L (ref 22–32)
Calcium: 9.3 mg/dL (ref 8.9–10.3)
Chloride: 104 mmol/L (ref 98–111)
Creatinine: 1.2 mg/dL (ref 0.61–1.24)
GFR, Estimated: >60 mL/min (ref >60)
Glucose, Bld: 97 mg/dL (ref 70–99)
Potassium: 4 mmol/L (ref 3.5–5.1)
Sodium: 138 mmol/L (ref 135–145)
Total Bilirubin: 0.8 mg/dL (ref 0.0–1.2)
Total Protein: 7.7 g/dL (ref 6.5–8.1)

## 2024-02-10 MED ORDER — METHYLPREDNISOLONE 4 MG PO TBPK: ORAL_TABLET | ORAL | 0 refills | Status: DC

## 2024-02-11 ENCOUNTER — Other Ambulatory Visit: Payer: Self-pay

## 2024-02-17 ENCOUNTER — Other Ambulatory Visit: Payer: Self-pay

## 2024-02-18 ENCOUNTER — Encounter: Payer: Self-pay | Admitting: Internal Medicine

## 2024-02-19 NOTE — Progress Notes (Unsigned)
 [facility] OFFICE PROGRESS NOTE  [name], MD [address] [place] [address]  DIAGNOSIS:  Stage IV clear-cell renal cell carcinoma initially diagnosed as locally advanced disease in 2020 with evidence of pulmonary metastasis in 2021.   PRIOR THERAPY: 1) status post right nephrectomy on August 27, 2019 in [place]  and the final pathology showed clear-cell renal cell carcinoma nuclear grade 3 with 10% necrosis and negative margin with the final pathologic stage of T2a, NX. 2) status post treatment with Keytruda  and axitinib  started March 2021 for 2 cycles interrupted because of autoimmune hepatitis.  3) axitinib  5 mg daily started July 05, 2020 discontinued after 1 week because of elevated LFTs. 4) status post cryoablation of left renal mass on May 30, 2021. 5) Keytruda  200 Mg IV every 3 weeks started July 27, 2021 and then switch it to 400 Mg every 6 weeks on November 09, 2021.  Last dose was given July 2024. 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 Nat Badger  CURRENT THERAPY: 1) Opdivo  480 mg IV every 4 weeks and Cabometyx  40 mg p.o. daily first dose on 12/30/23. Status post 1 cycle of treatment.  2) SBRT to the right chest wall/sternum and T9 under the care of Dr. Lorri Rota starting next week. Last dose on 01/16/24 3) Xgeva once the patient obtains dental clearance  INTERVAL HISTORY: Lonnel Gjerde 58 y.o. male returns to the clinic today for a follow up visit. The patient was last seen by myself and Dr. [name]. The patient had been undergoing elevated LFTs. It was unclear if related to immunotherapy or if related to his [place]. Therefore, his immunotherapy was held. When it came back to the clinic on 02/10/24. Therefore, it was felt his elevated LFTs were from [place]. Of note, the patient had elevated LFTs when he was on Inlyta  in the past when he lived  in [place] .   Therefore, he was given a medrol  dose pack.   Also at his last few appointments, they were endorsing stomatitis, ashy skin color, and rash along his face, dry scalp, brittle hair, increased pallor, and some cracking on his hands and feet. His symptoms had mostly resolved since last being seen.   He was having a lot of weight loss due to the stomatitis but he has gained 3 pounds back.  He has resumed playing tennis and plans to play golf, indicating increased physical activity and improved quality of life.  The tumor on his chest is decreasing in size, resulting in more freedom of movement and no pain during activities like serving in tennis or swinging a golf club.  He denies any fever, chills, or night sweats (environmental). He denies any unusual shortness of breath, cough, or hemoptysis. His bowel habits have returned to normal. He was also referred to Gi at his last appointment since he had not had a colonoscopy. He one episode 3-4 weeks ago with blood in the stool that went away. This was provoked by straining. He denies any nausea, vomiting, diarrhea, or constipation. He is here today for repeat blood work and for considering of cycle #2.   MEDICAL HISTORY: Past Medical History:  Diagnosis Date   Cancer [facility])    Renal cell carcinoma   Clear cell renal cell carcinoma (HCC)    Hypertension    Hypothyroidism    Sleep apnea    wears cpap  ALLERGIES:  is allergic to cleocin [clindamycin], penicillins, and tape.  MEDICATIONS:  Current Outpatient Medications  Medication Sig Dispense Refill   amLODipine  (NORVASC ) 5 MG tablet Take 7.5 mg by mouth daily before breakfast.     cabozantinib  (CABOMETYX ) 40 MG tablet Take 1 tablet (40 mg total) by mouth daily. Take on an empty stomach, 1 hour before or 2 hours after meals. 30 tablet 3   cyclobenzaprine (FLEXERIL) 10 MG tablet Take 1 tablet po 2x a day prn Orally as directed for 30 days     doxycycline  (VIBRA -TABS) 100 MG  tablet Take 1 tablet (100 mg total) by mouth 2 (two) times daily. (Patient not taking: Reported on 01/15/2024) 20 tablet 0   fluconazole (DIFLUCAN) 150 MG tablet Take 150 mg by mouth daily. (Patient not taking: Reported on 01/15/2024)     levothyroxine  (SYNTHROID ) 137 MCG tablet TAKE 1 TABLET(137 MCG) BY MOUTH DAILY BEFORE BREAKFAST 30 tablet 2   LORazepam  (ATIVAN ) 0.5 MG tablet 1 tablet p.o. 30 minutes before the PET scan repeat x 1 if needed. 2 tablet 0   magic mouthwash (nystatin , diphenhydrAMINE, alum & mag hydroxide) suspension mixture Take 5 mLs by mouth 3 (three) times daily as needed for mouth pain. (Suspension contains equal amounts of Maalox Extra Strength, nystatin , and diphenhydramine.) 210 mL 0   methocarbamol  (ROBAXIN ) 500 MG tablet Take 500 mg by mouth every 8 (eight) hours as needed for muscle spasms. (Patient not taking: Reported on 01/15/2024)     methylPREDNISolone  (MEDROL  DOSEPAK) 4 MG TBPK tablet Use as instructed 21 tablet 0   montelukast  (SINGULAIR ) 10 MG tablet Take 10 mg by mouth daily before breakfast.     nystatin  (MYCOSTATIN ) 100000 UNIT/ML suspension Take by mouth. (Patient not taking: Reported on 01/15/2024)     ondansetron  (ZOFRAN ) 8 MG tablet Take 1 tablet (8 mg total) by mouth every 8 (eight) hours as needed for nausea or vomiting. (Patient not taking: Reported on 01/15/2024) 30 tablet 1   prochlorperazine  (COMPAZINE ) 10 MG tablet Take 1 tablet (10 mg total) by mouth every 6 (six) hours as needed for nausea or vomiting. (Patient not taking: Reported on 01/15/2024) 30 tablet 1   tadalafil (CIALIS) 20 MG tablet Take 10-20 mg by mouth daily as needed for erectile dysfunction.     No current facility-administered medications for this visit.   Facility-Administered Medications Ordered in Other Visits  Medication Dose Route Frequency Provider Last Rate Last Admin   0.9 %  sodium chloride  infusion   Intravenous Continuous Marlene Simas, MD       nivolumab  (OPDIVO ) 480 mg in  sodium chloride  0.9 % 100 mL chemo infusion  480 mg Intravenous Once Marlene Simas, MD        SURGICAL HISTORY:  Past Surgical History:  Procedure Laterality Date   APPLICATION OF CRANIAL NAVIGATION N/A 10/13/2023   Procedure: APPLICATION OF CRANIAL NAVIGATION;  Surgeon: Augusto Blonder, MD;  Location: [facility] OR;  Service: Neurosurgery;  Laterality: N/A;   CRANIOTOMY N/A 10/13/2023   Procedure: SUBOCCIPITAL   CRANIOTOMY TUMOR EXCISION;  Surgeon: Augusto Blonder, MD;  Location: [facility] OR;  Service: Neurosurgery;  Laterality: N/A;   IR RADIOLOGIST EVAL & MGMT  04/04/2021   no procedures performed   IR RADIOLOGIST EVAL & MGMT  05/08/2021   no procedures performed   IR RADIOLOGIST EVAL & MGMT  08/12/2022   LUNG BIOPSY  2021   RADIOLOGY WITH ANESTHESIA N/A 05/30/2021   Procedure: CT WITH ANESTHESIA CRYOABLATION;  Surgeon: Erica Hau,  MD;  Location: [facility] ORS;  Service: Radiology;  Laterality: N/A;   right nephrectomy Right 08/2019   TONSILLECTOMY      REVIEW OF SYSTEMS:   Review of Systems  Constitutional: Negative for appetite change, chills, fatigue, fever and unexpected weight change.  HENT:  Improved stomatitis. Negative for mouth sores, nosebleeds, sore throat and trouble swallowing.   Eyes: Negative for eye problems and icterus.  Respiratory: Negative for cough, hemoptysis, shortness of breath and wheezing.   Cardiovascular: Negative for chest pain and leg swelling.  Gastrointestinal: Negative for abdominal pain, constipation, diarrhea, nausea and vomiting.  Genitourinary: Negative for bladder incontinence, difficulty urinating, dysuria, frequency and hematuria.   Musculoskeletal: Negative for back pain, gait problem, neck pain and neck stiffness.  Skin: Negative for itching and rash.  Neurological: Negative for dizziness, extremity weakness, gait problem, headaches, light-headedness and seizures.  Hematological: Negative for adenopathy. Does not bruise/bleed easily.   Psychiatric/Behavioral: Negative for confusion, depression and sleep disturbance. The patient is not nervous/anxious.     PHYSICAL EXAMINATION:  Blood pressure (!) 137/94, pulse 66, temperature 98.1 F (36.7 C), temperature source Temporal, resp. rate 13, weight 256 lb 3.2 oz (116.2 kg), SpO2 96%.  ECOG PERFORMANCE STATUS: 1  Physical Exam  Constitutional: Oriented to person, place, and time and well-developed, well-nourished, and in no distress.  HENT:  Head: Normocephalic and atraumatic.  Mouth/Throat: Oropharynx is clear and moist. No oropharyngeal exudate.  Eyes: Conjunctivae are normal. Right eye exhibits no discharge. Left eye exhibits no discharge. No scleral icterus.  Neck: Normal range of motion. Neck supple.  Cardiovascular: Normal rate, regular rhythm, normal heart sounds and intact distal pulses.   Pulmonary/Chest: Effort normal and breath sounds normal. No respiratory distress. No wheezes. No rales.  Abdominal: Soft. Bowel sounds are normal. Exhibits no distension and no mass. There is no tenderness.  Musculoskeletal: Normal range of motion. Exhibits no edema.  Lymphadenopathy:    No cervical adenopathy.  Neurological: Alert and oriented to person, place, and time. Exhibits normal muscle tone. Gait normal. Coordination normal.  Skin: Skin is warm and dry. No rash noted. Not diaphoretic. No erythema. No pallor.  Psychiatric: Mood, memory and judgment normal.  Vitals reviewed.  LABORATORY DATA: Lab Results  Component Value Date   WBC 3.7 (L) 02/25/2024   HGB 13.5 02/25/2024   HCT 40.0 02/25/2024   MCV 81.8 02/25/2024   PLT 193 02/25/2024      Chemistry      Component Value Date/Time   NA 139 02/25/2024 0820   K 3.8 02/25/2024 0820   CL 108 02/25/2024 0820   CO2 26 02/25/2024 0820   BUN 15 02/25/2024 0820   CREATININE 1.17 02/25/2024 0820      Component Value Date/Time   CALCIUM 8.8 (L) 02/25/2024 0820   ALKPHOS 85 02/25/2024 0820   AST 59 (H) 02/25/2024  0820   ALT 97 (H) 02/25/2024 0820   BILITOT 0.6 02/25/2024 0820       RADIOGRAPHIC STUDIES:  No results found.    ASSESSMENT/PLAN:  This is a very pleasant 58 year old male with stage IV clear-cell renal cell carcinoma. This was initially diagnosed with locally advanced disease in 2020. He had evidence of pulmonary metastases in 2021. He is status post right nephrectomy on 10/23/202020 in [place] . The final pathology showed clear-cell renal cell carcinoma nuclear grade 3 with 10% necrosis and negative margins. The final pathologic stage was T2a, NX.    The patient started treatment with Keytruda  and Axitinib  that  was started in March 2021.  He had 2 cycles of treatment but it was interrupted due to autoimmune hepatitis.  He then started on 5 mg daily of axitinib  on 07/05/2020 but this was also discontinued after 1 week due to elevated LFTs.   He underwent cryoablation to the left renal mass on 05/30/2021.   He then underwent treatment with Keytruda .  He was initially on 200 mg IV every 3 weeks on 07/27/2021 but then was switched to 400 mg every 6 weeks starting from 11/09/2021.   He was found to have a solitary right cerebellar brain lesion in November 2024 and underwent SRS on 10/10/2023 followed by craniotomy on 10/13/2023.   He was recently found to have disease progression and February 2024 with a lytic lesion in the upper sternum.  The patient is planning to to this lesion under the care of Dr. Lorri Rota as well as the T9 lesion.  The last day radiation is scheduled for 01/16/24   The patient was started back on treatment and he is status post his first cycle of IV nivolumab  480 mg every 4 weeks in addition to his oral treatment with Cabometyx  40 mg p.o. daily.    The patient has been struggling with elevated LFTs.  His nivolumab  was held and he continued on Cavo and he continued to have persistent LFTs.  Therefore at his last appointment he held his Cher Cordial and his LFTs are improving at this  time.  Therefore it is likely his elevated LFTs are from his [place].   Reviewed his labs with Dr. [name] today.  His AST is 97 and ALT is 59.  He will resume nivolumab  today as scheduled.  I will recheck his labs in 2-weeks.   Will continue to hold his Until his next appointment in which case we will resume at 20 mg.  He has an old bottle of 20 mg of cabo at home. Therefore, he does not need a refill at this time.   Dry mouth due to medication Dry mouth persists but sensitivity improved, allowing better food intake and weight gain.  Gastrointestinal bleeding Single episode of bright red blood per rectum attributed to hard bowel movement. No further episodes. - Follow up with gastroenterology to ensure appointment scheduling.  The patient was advised to call immediately if she has any concerning symptoms in the interval. The patient voices understanding of current disease status and treatment options and is in agreement with the current care plan. All questions were answered. The patient knows to call the clinic with any problems, questions or concerns. We can certainly see the patient much sooner if necessary       No orders of the defined types were placed in this encounter.    The total time spent in the appointment was 20-29 minutes  Venecia Mehl L Ilena Dieckman, PA-C 02/25/24

## 2024-02-24 ENCOUNTER — Ambulatory Visit: Payer: BC Managed Care – PPO

## 2024-02-24 ENCOUNTER — Other Ambulatory Visit: Payer: BC Managed Care – PPO

## 2024-02-24 ENCOUNTER — Ambulatory Visit: Payer: BC Managed Care – PPO | Admitting: Physician Assistant

## 2024-02-25 ENCOUNTER — Inpatient Hospital Stay (HOSPITAL_BASED_OUTPATIENT_CLINIC_OR_DEPARTMENT_OTHER): Admitting: Physician Assistant

## 2024-02-25 ENCOUNTER — Inpatient Hospital Stay

## 2024-02-25 ENCOUNTER — Other Ambulatory Visit: Payer: Self-pay | Admitting: Physician Assistant

## 2024-02-25 VITALS — BP 137/94 | HR 66 | Temp 98.1°F | Resp 13 | Wt 256.2 lb

## 2024-02-25 DIAGNOSIS — C649 Malignant neoplasm of unspecified kidney, except renal pelvis: Secondary | ICD-10-CM

## 2024-02-25 DIAGNOSIS — C7800 Secondary malignant neoplasm of unspecified lung: Secondary | ICD-10-CM | POA: Diagnosis not present

## 2024-02-25 DIAGNOSIS — R682 Dry mouth, unspecified: Secondary | ICD-10-CM | POA: Diagnosis not present

## 2024-02-25 DIAGNOSIS — R21 Rash and other nonspecific skin eruption: Secondary | ICD-10-CM | POA: Diagnosis not present

## 2024-02-25 DIAGNOSIS — C7931 Secondary malignant neoplasm of brain: Secondary | ICD-10-CM | POA: Diagnosis not present

## 2024-02-25 DIAGNOSIS — K922 Gastrointestinal hemorrhage, unspecified: Secondary | ICD-10-CM | POA: Diagnosis not present

## 2024-02-25 DIAGNOSIS — C7951 Secondary malignant neoplasm of bone: Secondary | ICD-10-CM | POA: Diagnosis not present

## 2024-02-25 DIAGNOSIS — Z9226 Personal history of immune checkpoint inhibitor therapy: Secondary | ICD-10-CM | POA: Diagnosis not present

## 2024-02-25 DIAGNOSIS — Z881 Allergy status to other antibiotic agents status: Secondary | ICD-10-CM | POA: Diagnosis not present

## 2024-02-25 DIAGNOSIS — R231 Pallor: Secondary | ICD-10-CM | POA: Diagnosis not present

## 2024-02-25 DIAGNOSIS — Z5112 Encounter for antineoplastic immunotherapy: Secondary | ICD-10-CM

## 2024-02-25 DIAGNOSIS — Z880 Allergy status to penicillin: Secondary | ICD-10-CM | POA: Diagnosis not present

## 2024-02-25 DIAGNOSIS — C641 Malignant neoplasm of right kidney, except renal pelvis: Secondary | ICD-10-CM | POA: Diagnosis not present

## 2024-02-25 DIAGNOSIS — R7989 Other specified abnormal findings of blood chemistry: Secondary | ICD-10-CM | POA: Diagnosis not present

## 2024-02-25 DIAGNOSIS — Z79899 Other long term (current) drug therapy: Secondary | ICD-10-CM | POA: Diagnosis not present

## 2024-02-25 DIAGNOSIS — Z905 Acquired absence of kidney: Secondary | ICD-10-CM | POA: Diagnosis not present

## 2024-02-25 LAB — CBC WITH DIFFERENTIAL (CANCER CENTER ONLY)
Abs Immature Granulocytes: 0.01 10*3/uL (ref 0.00–0.07)
Basophils Absolute: 0 10*3/uL (ref 0.0–0.1)
Basophils Relative: 1 %
Eosinophils Absolute: 0.3 10*3/uL (ref 0.0–0.5)
Eosinophils Relative: 9 %
HCT: 40 % (ref 39.0–52.0)
Hemoglobin: 13.5 g/dL (ref 13.0–17.0)
Immature Granulocytes: 0 %
Lymphocytes Relative: 18 %
Lymphs Abs: 0.7 10*3/uL (ref 0.7–4.0)
MCH: 27.6 pg (ref 26.0–34.0)
MCHC: 33.8 g/dL (ref 30.0–36.0)
MCV: 81.8 fL (ref 80.0–100.0)
Monocytes Absolute: 0.3 10*3/uL (ref 0.1–1.0)
Monocytes Relative: 9 %
Neutro Abs: 2.3 10*3/uL (ref 1.7–7.7)
Neutrophils Relative %: 63 %
Platelet Count: 193 10*3/uL (ref 150–400)
RBC: 4.89 MIL/uL (ref 4.22–5.81)
RDW: 19.8 % — ABNORMAL HIGH (ref 11.5–15.5)
WBC Count: 3.7 10*3/uL — ABNORMAL LOW (ref 4.0–10.5)
nRBC: 0 % (ref 0.0–0.2)

## 2024-02-25 LAB — CMP (CANCER CENTER ONLY)
ALT: 97 U/L — ABNORMAL HIGH (ref 0–44)
AST: 59 U/L — ABNORMAL HIGH (ref 15–41)
Albumin: 3.9 g/dL (ref 3.5–5.0)
Alkaline Phosphatase: 85 U/L (ref 38–126)
Anion gap: 5 (ref 5–15)
BUN: 15 mg/dL (ref 6–20)
CO2: 26 mmol/L (ref 22–32)
Calcium: 8.8 mg/dL — ABNORMAL LOW (ref 8.9–10.3)
Chloride: 108 mmol/L (ref 98–111)
Creatinine: 1.17 mg/dL (ref 0.61–1.24)
GFR, Estimated: >60 mL/min (ref >60)
Glucose, Bld: 113 mg/dL — ABNORMAL HIGH (ref 70–99)
Potassium: 3.8 mmol/L (ref 3.5–5.1)
Sodium: 139 mmol/L (ref 135–145)
Total Bilirubin: 0.6 mg/dL (ref 0.0–1.2)
Total Protein: 6.4 g/dL — ABNORMAL LOW (ref 6.5–8.1)

## 2024-02-25 MED ORDER — SODIUM CHLORIDE 0.9 % IV SOLN
480.0000 mg | Freq: Once | INTRAVENOUS | Status: AC
  Administered 2024-02-25: 480 mg via INTRAVENOUS
  Filled 2024-02-25: qty 48

## 2024-02-25 MED ORDER — SODIUM CHLORIDE 0.9 % IV SOLN: INTRAVENOUS | Status: DC

## 2024-02-26 ENCOUNTER — Other Ambulatory Visit: Payer: Self-pay

## 2024-02-27 ENCOUNTER — Telehealth: Payer: Self-pay | Admitting: Radiation Therapy

## 2024-02-27 NOTE — Telephone Encounter (Signed)
 I called to speak with Michael [name], no answer so I left a detailed message on his machine about the June brain MRI and telephone follow-up appointments. A reminder card was also sent out in the mail.  My contact information was included with a request to call back with any questions or conflicts.   Axel Bohr R.T.(R)(T) Radiation Special Procedures Lead

## 2024-03-02 ENCOUNTER — Other Ambulatory Visit

## 2024-03-02 ENCOUNTER — Ambulatory Visit: Admitting: Physician Assistant

## 2024-03-02 ENCOUNTER — Encounter: Payer: Self-pay | Admitting: Physician Assistant

## 2024-03-03 DIAGNOSIS — G4733 Obstructive sleep apnea (adult) (pediatric): Secondary | ICD-10-CM | POA: Diagnosis not present

## 2024-03-05 ENCOUNTER — Other Ambulatory Visit: Payer: Self-pay

## 2024-03-09 ENCOUNTER — Other Ambulatory Visit: Payer: Self-pay | Admitting: Medical Oncology

## 2024-03-09 ENCOUNTER — Ambulatory Visit: Admitting: Physician Assistant

## 2024-03-09 ENCOUNTER — Other Ambulatory Visit

## 2024-03-09 ENCOUNTER — Ambulatory Visit

## 2024-03-09 DIAGNOSIS — C649 Malignant neoplasm of unspecified kidney, except renal pelvis: Secondary | ICD-10-CM

## 2024-03-10 ENCOUNTER — Inpatient Hospital Stay

## 2024-03-12 ENCOUNTER — Inpatient Hospital Stay: Attending: Oncology

## 2024-03-12 DIAGNOSIS — Z79899 Other long term (current) drug therapy: Secondary | ICD-10-CM | POA: Insufficient documentation

## 2024-03-12 DIAGNOSIS — C641 Malignant neoplasm of right kidney, except renal pelvis: Secondary | ICD-10-CM | POA: Diagnosis not present

## 2024-03-12 DIAGNOSIS — R946 Abnormal results of thyroid function studies: Secondary | ICD-10-CM | POA: Diagnosis not present

## 2024-03-12 DIAGNOSIS — C649 Malignant neoplasm of unspecified kidney, except renal pelvis: Secondary | ICD-10-CM

## 2024-03-12 LAB — CBC WITH DIFFERENTIAL (CANCER CENTER ONLY)
Abs Immature Granulocytes: 0.02 10*3/uL (ref 0.00–0.07)
Basophils Absolute: 0 10*3/uL (ref 0.0–0.1)
Basophils Relative: 1 %
Eosinophils Absolute: 0.2 10*3/uL (ref 0.0–0.5)
Eosinophils Relative: 4 %
HCT: 39.3 % (ref 39.0–52.0)
Hemoglobin: 13.1 g/dL (ref 13.0–17.0)
Immature Granulocytes: 1 %
Lymphocytes Relative: 17 %
Lymphs Abs: 0.7 10*3/uL (ref 0.7–4.0)
MCH: 28.1 pg (ref 26.0–34.0)
MCHC: 33.3 g/dL (ref 30.0–36.0)
MCV: 84.3 fL (ref 80.0–100.0)
Monocytes Absolute: 0.5 10*3/uL (ref 0.1–1.0)
Monocytes Relative: 11 %
Neutro Abs: 3 10*3/uL (ref 1.7–7.7)
Neutrophils Relative %: 66 %
Platelet Count: 236 10*3/uL (ref 150–400)
RBC: 4.66 MIL/uL (ref 4.22–5.81)
RDW: 19.8 % — ABNORMAL HIGH (ref 11.5–15.5)
WBC Count: 4.4 10*3/uL (ref 4.0–10.5)
nRBC: 0 % (ref 0.0–0.2)

## 2024-03-12 LAB — CMP (CANCER CENTER ONLY)
ALT: 74 U/L — ABNORMAL HIGH (ref 0–44)
AST: 43 U/L — ABNORMAL HIGH (ref 15–41)
Albumin: 4 g/dL (ref 3.5–5.0)
Alkaline Phosphatase: 84 U/L (ref 38–126)
Anion gap: 6 (ref 5–15)
BUN: 18 mg/dL (ref 6–20)
CO2: 25 mmol/L (ref 22–32)
Calcium: 8.7 mg/dL — ABNORMAL LOW (ref 8.9–10.3)
Chloride: 109 mmol/L (ref 98–111)
Creatinine: 1.1 mg/dL (ref 0.61–1.24)
GFR, Estimated: >60 mL/min (ref >60)
Glucose, Bld: 106 mg/dL — ABNORMAL HIGH (ref 70–99)
Potassium: 4.1 mmol/L (ref 3.5–5.1)
Sodium: 140 mmol/L (ref 135–145)
Total Bilirubin: 0.5 mg/dL (ref 0.0–1.2)
Total Protein: 6.5 g/dL (ref 6.5–8.1)

## 2024-03-12 LAB — TSH: TSH: 2.49 u[IU]/mL (ref 0.350–4.500)

## 2024-03-13 LAB — T4: T4, Total: 8.6 ug/dL (ref 4.5–12.0)

## 2024-03-16 ENCOUNTER — Other Ambulatory Visit: Payer: Self-pay | Admitting: Physician Assistant

## 2024-03-16 DIAGNOSIS — E039 Hypothyroidism, unspecified: Secondary | ICD-10-CM

## 2024-03-18 NOTE — Progress Notes (Deleted)
 [name] OFFICE PROGRESS NOTE  [name], MD [address] [place] [address]  DIAGNOSIS: Stage IV clear-cell renal cell carcinoma initially diagnosed as locally advanced disease in 2020 with evidence of pulmonary metastasis in 2021.   PRIOR THERAPY: 1) status post right nephrectomy on August 27, 2019 in [place]  and the final pathology showed clear-cell renal cell carcinoma nuclear grade 3 with 10% necrosis and negative margin with the final pathologic stage of T2a, NX. 2) status post treatment with Keytruda  and axitinib  started March 2021 for 2 cycles interrupted because of autoimmune hepatitis.  3) axitinib  5 mg daily started July 05, 2020 discontinued after 1 week because of elevated LFTs. 4) status post cryoablation of left renal mass on May 30, 2021. 5) Keytruda  200 Mg IV every 3 weeks started July 27, 2021 and then switch it to 400 Mg every 6 weeks on November 09, 2021.  Last dose was given July 2024. 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 Nat Badger    CURRENT THERAPY: 1) Opdivo  480 mg IV every 4 weeks and Cabometyx  40 mg p.o. daily first dose on 12/30/23. Status post 2 cycles of treatment.  2) SBRT to the right chest wall/sternum and T9 under the care of Dr. Lorri Rota starting next week. Last dose on 01/16/24 3) Xgeva once the patient obtains dental clearance  INTERVAL HISTORY: Michael [name] 58 y.o. male returns  to the clinic today for a follow up visit. The patient was last seen by myself and Dr. [name] 1 month ago. The patient had been undergoing elevated LFTs. Which was found to be from the cabo as opposed to the immunotherapy. Therefore, at his last appointment he was put on hold. We were going to reassess him today before deciding whether to resume this at a lower dose (20 mg).   Since his last appointment, the patient has continued  to feel much better. When he was on cabo in the past, he had stomatitis, rash, dry scalp, and cracking of the hands/feet. He also had weight loss.   He has been active with playing golf and tennis.   The tumor on his chest is decreasing in size, resulting in more freedom of movement and no pain during activities like serving in tennis or swinging a golf club.   He denies any fever, chills, or night sweats (environmental). He denies any unusual shortness of breath, cough, or hemoptysis. His bowel habits have returned to normal. He was also referred to Gi at his last appointment since he had not had a colonoscopy. He one episode 3-4 weeks ago with blood in the stool that went away. This was provoked by straining. He denies any nausea, vomiting, diarrhea, or constipation. He is here today for repeat blood work and for considering of cycle #3.   MEDICAL HISTORY: Past Medical History:  Diagnosis Date   Cancer [facility])    Renal cell carcinoma   Clear cell renal cell carcinoma (HCC)    Hypertension    Hypothyroidism    Sleep apnea    wears cpap    ALLERGIES:  is allergic to cleocin [clindamycin], penicillins, and tape.  MEDICATIONS:  Current Outpatient Medications  Medication Sig Dispense Refill   amLODipine  (NORVASC ) 5 MG tablet Take 7.5 mg by mouth daily before breakfast.     cabozantinib  (CABOMETYX ) 40 MG tablet Take 1 tablet (40 mg total) by  mouth daily. Take on an empty stomach, 1 hour before or 2 hours after meals. 30 tablet 3   cyclobenzaprine (FLEXERIL) 10 MG tablet Take 1 tablet po 2x a day prn Orally as directed for 30 days     doxycycline  (VIBRA -TABS) 100 MG tablet Take 1 tablet (100 mg total) by mouth 2 (two) times daily. (Patient not taking: Reported on 01/15/2024) 20 tablet 0   fluconazole (DIFLUCAN) 150 MG tablet Take 150 mg by mouth daily. (Patient not taking: Reported on 01/15/2024)     levothyroxine  (SYNTHROID ) 137 MCG tablet TAKE 1 TABLET(137 MCG) BY MOUTH DAILY BEFORE BREAKFAST  30 tablet 2   LORazepam  (ATIVAN ) 0.5 MG tablet 1 tablet p.o. 30 minutes before the PET scan repeat x 1 if needed. 2 tablet 0   magic mouthwash (nystatin , diphenhydrAMINE, alum & mag hydroxide) suspension mixture Take 5 mLs by mouth 3 (three) times daily as needed for mouth pain. (Suspension contains equal amounts of Maalox Extra Strength, nystatin , and diphenhydramine.) 210 mL 0   methocarbamol  (ROBAXIN ) 500 MG tablet Take 500 mg by mouth every 8 (eight) hours as needed for muscle spasms. (Patient not taking: Reported on 01/15/2024)     methylPREDNISolone  (MEDROL  DOSEPAK) 4 MG TBPK tablet Use as instructed 21 tablet 0   montelukast  (SINGULAIR ) 10 MG tablet Take 10 mg by mouth daily before breakfast.     nystatin  (MYCOSTATIN ) 100000 UNIT/ML suspension Take by mouth. (Patient not taking: Reported on 01/15/2024)     ondansetron  (ZOFRAN ) 8 MG tablet Take 1 tablet (8 mg total) by mouth every 8 (eight) hours as needed for nausea or vomiting. (Patient not taking: Reported on 01/15/2024) 30 tablet 1   prochlorperazine  (COMPAZINE ) 10 MG tablet Take 1 tablet (10 mg total) by mouth every 6 (six) hours as needed for nausea or vomiting. (Patient not taking: Reported on 01/15/2024) 30 tablet 1   tadalafil (CIALIS) 20 MG tablet Take 10-20 mg by mouth daily as needed for erectile dysfunction.     No current facility-administered medications for this visit.    SURGICAL HISTORY:  Past Surgical History:  Procedure Laterality Date   APPLICATION OF CRANIAL NAVIGATION N/A 10/13/2023   Procedure: APPLICATION OF CRANIAL NAVIGATION;  Surgeon: Augusto Blonder, MD;  Location: [facility] OR;  Service: Neurosurgery;  Laterality: N/A;   CRANIOTOMY N/A 10/13/2023   Procedure: SUBOCCIPITAL   CRANIOTOMY TUMOR EXCISION;  Surgeon: Augusto Blonder, MD;  Location: [facility] OR;  Service: Neurosurgery;  Laterality: N/A;   IR RADIOLOGIST EVAL & MGMT  04/04/2021   no procedures performed   IR RADIOLOGIST EVAL & MGMT  05/08/2021   no procedures  performed   IR RADIOLOGIST EVAL & MGMT  08/12/2022   LUNG BIOPSY  2021   RADIOLOGY WITH ANESTHESIA N/A 05/30/2021   Procedure: CT WITH ANESTHESIA CRYOABLATION;  Surgeon: Erica Hau, MD;  Location: [facility] ORS;  Service: Radiology;  Laterality: N/A;   right nephrectomy Right 08/2019   TONSILLECTOMY      REVIEW OF SYSTEMS:   Review of Systems  Constitutional: Negative for appetite change, chills, fatigue, fever and unexpected weight change.  HENT:   Negative for mouth sores, nosebleeds, sore throat and trouble swallowing.   Eyes: Negative for eye problems and icterus.  Respiratory: Negative for cough, hemoptysis, shortness of breath and wheezing.   Cardiovascular: Negative for chest pain and leg swelling.  Gastrointestinal: Negative for abdominal pain, constipation, diarrhea, nausea and vomiting.  Genitourinary: Negative for bladder incontinence, difficulty urinating, dysuria, frequency and hematuria.   Musculoskeletal:  Negative for back pain, gait problem, neck pain and neck stiffness.  Skin: Negative for itching and rash.  Neurological: Negative for dizziness, extremity weakness, gait problem, headaches, light-headedness and seizures.  Hematological: Negative for adenopathy. Does not bruise/bleed easily.  Psychiatric/Behavioral: Negative for confusion, depression and sleep disturbance. The patient is not nervous/anxious.     PHYSICAL EXAMINATION:  There were no vitals taken for this visit.  ECOG PERFORMANCE STATUS: {CHL ONC ECOG [id number]  Physical Exam  Constitutional: Oriented to person, place, and time and well-developed, well-nourished, and in no distress. No distress.  HENT:  Head: Normocephalic and atraumatic.  Mouth/Throat: Oropharynx is clear and moist. No oropharyngeal exudate.  Eyes: Conjunctivae are normal. Right eye exhibits no discharge. Left eye exhibits no discharge. No scleral icterus.  Neck: Normal range of motion. Neck supple.  Cardiovascular: Normal rate,  regular rhythm, normal heart sounds and intact distal pulses.   Pulmonary/Chest: Effort normal and breath sounds normal. No respiratory distress. No wheezes. No rales.  Abdominal: Soft. Bowel sounds are normal. Exhibits no distension and no mass. There is no tenderness.  Musculoskeletal: Normal range of motion. Exhibits no edema.  Lymphadenopathy:    No cervical adenopathy.  Neurological: Alert and oriented to person, place, and time. Exhibits normal muscle tone. Gait normal. Coordination normal.  Skin: Skin is warm and dry. No rash noted. Not diaphoretic. No erythema. No pallor.  Psychiatric: Mood, memory and judgment normal.  Vitals reviewed.  LABORATORY DATA: Lab Results  Component Value Date   WBC 4.4 03/12/2024   HGB 13.1 03/12/2024   HCT 39.3 03/12/2024   MCV 84.3 03/12/2024   PLT 236 03/12/2024      Chemistry      Component Value Date/Time   NA 140 03/12/2024 0924   K 4.1 03/12/2024 0924   CL 109 03/12/2024 0924   CO2 25 03/12/2024 0924   BUN 18 03/12/2024 0924   CREATININE 1.10 03/12/2024 0924      Component Value Date/Time   CALCIUM 8.7 (L) 03/12/2024 0924   ALKPHOS 84 03/12/2024 0924   AST 43 (H) 03/12/2024 0924   ALT 74 (H) 03/12/2024 0924   BILITOT 0.5 03/12/2024 0924       RADIOGRAPHIC STUDIES:  No results found.   ASSESSMENT/PLAN:  This is a very pleasant 58 year old male with stage IV clear-cell renal cell carcinoma. This was initially diagnosed with locally advanced disease in 2020. He had evidence of pulmonary metastases in 2021. He is status post right nephrectomy on 10/23/202020 in [place] . The final pathology showed clear-cell renal cell carcinoma nuclear grade 3 with 10% necrosis and negative margins. The final pathologic stage was T2a, NX.    The patient started treatment with Keytruda  and Axitinib  that was started in March 2021.  He had 2 cycles of treatment but it was interrupted due to autoimmune hepatitis.  He then started on 5 mg daily of  axitinib  on 07/05/2020 but this was also discontinued after 1 week due to elevated LFTs.   He underwent cryoablation to the left renal mass on 05/30/2021.   He then underwent treatment with Keytruda .  He was initially on 200 mg IV every 3 weeks on 07/27/2021 but then was switched to 400 mg every 6 weeks starting from 11/09/2021.   He was found to have a solitary right cerebellar brain lesion in November 2024 and underwent SRS on 10/10/2023 followed by craniotomy on 10/13/2023.  He was recently found to have disease progression and February 2024 with a lytic lesion  in the upper sternum.  The patient is planning to to this lesion under the care of Dr. Lorri Rota as well as the T9 lesion.  The last day radiation is scheduled for 01/16/24   The patient was started back on treatment and he is status post his first cycle of IV nivolumab  480 mg every 4 weeks in addition to his oral treatment with Cabometyx  40 mg p.o. daily.    The patient has been struggling with elevated LFTs.  His nivolumab  was held and he continued on Cavo and he continued to have persistent LFTs.  Therefore at his last appointment he held his [place] and his LFTs are improving at this time.  Therefore it is likely his elevated LFTs are from his [place].   This was held. His LFTs have *** at this time.   The patient was seen with Dr. [name]. Dr. [name] recommends resuming the cabometryx with 20 mg.   We will arrange for repeat labs in 2 weeks.   I will arrange for a restaging CT scan of the CAP prior to his next cycle of treatment.   Dry mouth due to medication Dry mouth persists but sensitivity improved, allowing better food intake and weight gain.   Gastrointestinal bleeding Single episode of bright red blood per rectum attributed to hard bowel movement. No further episodes. - Follow up with gastroenterology to ensure appointment scheduling.  The patient was advised to call immediately if he has any concerning symptoms in the  interval. The patient voices understanding of current disease status and treatment options and is in agreement with the current care plan. All questions were answered. The patient knows to call the clinic with any problems, questions or concerns. We can certainly see the patient much sooner if necessary    No orders of the defined types were placed in this encounter.    I spent {CHL ONC TIME VISIT - CBJSE:[id number]} counseling the patient face to face. The total time spent in the appointment was {CHL ONC TIME VISIT - VPXTG:[id number]}.  Dabria Wadas L Donnavan Covault, PA-C 03/18/24

## 2024-03-22 ENCOUNTER — Other Ambulatory Visit: Payer: Self-pay

## 2024-03-22 DIAGNOSIS — H1013 Acute atopic conjunctivitis, bilateral: Secondary | ICD-10-CM | POA: Diagnosis not present

## 2024-03-23 ENCOUNTER — Telehealth: Payer: Self-pay | Admitting: Internal Medicine

## 2024-03-23 NOTE — Telephone Encounter (Signed)
 Rescheduled appointments to the following week per the patients request. Patient stated he will still be out of town.

## 2024-03-24 ENCOUNTER — Ambulatory Visit: Admitting: Internal Medicine

## 2024-03-24 ENCOUNTER — Ambulatory Visit

## 2024-03-24 ENCOUNTER — Other Ambulatory Visit: Payer: Self-pay

## 2024-03-24 ENCOUNTER — Other Ambulatory Visit

## 2024-03-25 ENCOUNTER — Ambulatory Visit

## 2024-03-25 ENCOUNTER — Ambulatory Visit: Admitting: Physician Assistant

## 2024-03-25 ENCOUNTER — Other Ambulatory Visit

## 2024-03-25 ENCOUNTER — Ambulatory Visit: Admitting: Internal Medicine

## 2024-03-26 DIAGNOSIS — G4733 Obstructive sleep apnea (adult) (pediatric): Secondary | ICD-10-CM | POA: Diagnosis not present

## 2024-03-26 NOTE — Progress Notes (Deleted)
 [facility] OFFICE PROGRESS NOTE  [name], MD [address] 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   PRIOR THERAPY: 1) status post right nephrectomy on August 27, 2019 in [place]  and the final pathology showed clear-cell renal cell carcinoma nuclear grade 3 with 10% necrosis and negative margin with the final pathologic stage of T2a, NX. 2) status post treatment with Keytruda  and axitinib  started March 2021 for 2 cycles interrupted because of autoimmune hepatitis.  3) axitinib  5 mg daily started July 05, 2020 discontinued after 1 week because of elevated LFTs. 4) status post cryoablation of left renal mass on May 30, 2021. 5) Keytruda  200 Mg IV every 3 weeks started July 27, 2021 and then switch it to 400 Mg every 6 weeks on November 09, 2021.  Last dose was given July 2024. 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 Nat Badger    CURRENT THERAPY: 1) Opdivo  480 mg IV every 4 weeks and Cabometyx  40 mg p.o. daily first dose on 12/30/23. Status post 2 cycles of treatment.  2) SBRT to the right chest wall/sternum and T9 under the care of Dr. Lorri Rota starting next week. Last dose on 01/16/24 3) Xgeva once the patient obtains dental clearance  INTERVAL HISTORY: Michael [name] 58 y.o. male returns  to the clinic today for a follow up visit. The patient was last seen by myself and Dr. [name] 1 month ago. The patient had been undergoing elevated LFTs. Which was found to be from the cabo as opposed to the immunotherapy. Therefore, at his last appointment he was put on hold. We were going to reassess him today before deciding whether to resume this at a lower dose (20 mg).   Since his last appointment, the patient has continued  to feel much better. When he was on cabo in the past, he had stomatitis, rash, dry scalp, and cracking of the hands/feet. He also had weight loss.   He has been active with playing golf and tennis.   The tumor on his chest is decreasing in size, resulting in more freedom of movement and no pain during activities like serving in tennis or swinging a golf club.   He denies any fever, chills, or night sweats (environmental). He denies any unusual shortness of breath, cough, or hemoptysis. His bowel habits have returned to normal. He was also referred to Gi at his last appointment since he had not had a colonoscopy. He one episode 3-4 weeks ago with blood in the stool that went away. This was provoked by straining. He denies any nausea, vomiting, diarrhea, or constipation. He is here today for repeat blood work and for considering of cycle #3.   MEDICAL HISTORY: Past Medical History:  Diagnosis Date   Cancer [facility])    Renal cell carcinoma   Clear cell renal cell carcinoma (HCC)    Hypertension    Hypothyroidism    Sleep apnea    wears cpap    ALLERGIES:  is allergic to cleocin [clindamycin], penicillins, and tape.  MEDICATIONS:  Current Outpatient Medications  Medication Sig Dispense Refill   amLODipine  (NORVASC ) 5 MG tablet Take 7.5 mg by mouth daily before breakfast.     cabozantinib  (CABOMETYX ) 40 MG tablet Take 1 tablet (40 mg total) by  mouth daily. Take on an empty stomach, 1 hour before or 2 hours after meals. 30 tablet 3   cyclobenzaprine (FLEXERIL) 10 MG tablet Take 1 tablet po 2x a day prn Orally as directed for 30 days     doxycycline  (VIBRA -TABS) 100 MG tablet Take 1 tablet (100 mg total) by mouth 2 (two) times daily. (Patient not taking: Reported on 01/15/2024) 20 tablet 0   fluconazole (DIFLUCAN) 150 MG tablet Take 150 mg by mouth daily. (Patient not taking: Reported on 01/15/2024)     levothyroxine  (SYNTHROID ) 137 MCG tablet TAKE 1 TABLET(137 MCG) BY MOUTH DAILY BEFORE BREAKFAST  30 tablet 2   LORazepam  (ATIVAN ) 0.5 MG tablet 1 tablet p.o. 30 minutes before the PET scan repeat x 1 if needed. 2 tablet 0   magic mouthwash (nystatin , diphenhydrAMINE, alum & mag hydroxide) suspension mixture Take 5 mLs by mouth 3 (three) times daily as needed for mouth pain. (Suspension contains equal amounts of Maalox Extra Strength, nystatin , and diphenhydramine.) 210 mL 0   methocarbamol  (ROBAXIN ) 500 MG tablet Take 500 mg by mouth every 8 (eight) hours as needed for muscle spasms. (Patient not taking: Reported on 01/15/2024)     methylPREDNISolone  (MEDROL  DOSEPAK) 4 MG TBPK tablet Use as instructed 21 tablet 0   montelukast  (SINGULAIR ) 10 MG tablet Take 10 mg by mouth daily before breakfast.     nystatin  (MYCOSTATIN ) 100000 UNIT/ML suspension Take by mouth. (Patient not taking: Reported on 01/15/2024)     ondansetron  (ZOFRAN ) 8 MG tablet Take 1 tablet (8 mg total) by mouth every 8 (eight) hours as needed for nausea or vomiting. (Patient not taking: Reported on 01/15/2024) 30 tablet 1   prochlorperazine  (COMPAZINE ) 10 MG tablet Take 1 tablet (10 mg total) by mouth every 6 (six) hours as needed for nausea or vomiting. (Patient not taking: Reported on 01/15/2024) 30 tablet 1   tadalafil (CIALIS) 20 MG tablet Take 10-20 mg by mouth daily as needed for erectile dysfunction.     No current facility-administered medications for this visit.    SURGICAL HISTORY:  Past Surgical History:  Procedure Laterality Date   APPLICATION OF CRANIAL NAVIGATION N/A 10/13/2023   Procedure: APPLICATION OF CRANIAL NAVIGATION;  Surgeon: Augusto Blonder, MD;  Location: [facility] OR;  Service: Neurosurgery;  Laterality: N/A;   CRANIOTOMY N/A 10/13/2023   Procedure: SUBOCCIPITAL   CRANIOTOMY TUMOR EXCISION;  Surgeon: Augusto Blonder, MD;  Location: [facility] OR;  Service: Neurosurgery;  Laterality: N/A;   IR RADIOLOGIST EVAL & MGMT  04/04/2021   no procedures performed   IR RADIOLOGIST EVAL & MGMT  05/08/2021   no procedures  performed   IR RADIOLOGIST EVAL & MGMT  08/12/2022   LUNG BIOPSY  2021   RADIOLOGY WITH ANESTHESIA N/A 05/30/2021   Procedure: CT WITH ANESTHESIA CRYOABLATION;  Surgeon: Erica Hau, MD;  Location: [facility];  Service: Radiology;  Laterality: N/A;   right nephrectomy Right 08/2019   TONSILLECTOMY      REVIEW OF SYSTEMS:   Review of Systems  Constitutional: Negative for appetite change, chills, fatigue, fever and unexpected weight change.  HENT:   Negative for mouth sores, nosebleeds, sore throat and trouble swallowing.   Eyes: Negative for eye problems and icterus.  Respiratory: Negative for cough, hemoptysis, shortness of breath and wheezing.   Cardiovascular: Negative for chest pain and leg swelling.  Gastrointestinal: Negative for abdominal pain, constipation, diarrhea, nausea and vomiting.  Genitourinary: Negative for bladder incontinence, difficulty urinating, dysuria, frequency and hematuria.   Musculoskeletal:  Negative for back pain, gait problem, neck pain and neck stiffness.  Skin: Negative for itching and rash.  Neurological: Negative for dizziness, extremity weakness, gait problem, headaches, light-headedness and seizures.  Hematological: Negative for adenopathy. Does not bruise/bleed easily.  Psychiatric/Behavioral: Negative for confusion, depression and sleep disturbance. The patient is not nervous/anxious.     PHYSICAL EXAMINATION:  There were no vitals taken for this visit.  ECOG PERFORMANCE STATUS: {CHL ONC ECOG [id number]  Physical Exam  Constitutional: Oriented to person, place, and time and well-developed, well-nourished, and in no distress. No distress.  HENT:  Head: Normocephalic and atraumatic.  Mouth/Throat: Oropharynx is clear and moist. No oropharyngeal exudate.  Eyes: Conjunctivae are normal. Right eye exhibits no discharge. Left eye exhibits no discharge. No scleral icterus.  Neck: Normal range of motion. Neck supple.  Cardiovascular: Normal rate,  regular rhythm, normal heart sounds and intact distal pulses.   Pulmonary/Chest: Effort normal and breath sounds normal. No respiratory distress. No wheezes. No rales.  Abdominal: Soft. Bowel sounds are normal. Exhibits no distension and no mass. There is no tenderness.  Musculoskeletal: Normal range of motion. Exhibits no edema.  Lymphadenopathy:    No cervical adenopathy.  Neurological: Alert and oriented to person, place, and time. Exhibits normal muscle tone. Gait normal. Coordination normal.  Skin: Skin is warm and dry. No rash noted. Not diaphoretic. No erythema. No pallor.  Psychiatric: Mood, memory and judgment normal.  Vitals reviewed.  LABORATORY DATA: Lab Results  Component Value Date   WBC 4.4 03/12/2024   HGB 13.1 03/12/2024   HCT 39.3 03/12/2024   MCV 84.3 03/12/2024   PLT 236 03/12/2024      Chemistry      Component Value Date/Time   NA 140 03/12/2024 0924   K 4.1 03/12/2024 0924   CL 109 03/12/2024 0924   CO2 25 03/12/2024 0924   BUN 18 03/12/2024 0924   CREATININE 1.10 03/12/2024 0924      Component Value Date/Time   CALCIUM 8.7 (L) 03/12/2024 0924   ALKPHOS 84 03/12/2024 0924   AST 43 (H) 03/12/2024 0924   ALT 74 (H) 03/12/2024 0924   BILITOT 0.5 03/12/2024 0924       RADIOGRAPHIC STUDIES:  No results found.   ASSESSMENT/PLAN:  This is a very pleasant 58 year old male with stage IV clear-cell renal cell carcinoma. This was initially diagnosed with locally advanced disease in 2020. He had evidence of pulmonary metastases in 2021. He is status post right nephrectomy on 10/23/202020 in [place] . The final pathology showed clear-cell renal cell carcinoma nuclear grade 3 with 10% necrosis and negative margins. The final pathologic stage was T2a, NX.    The patient started treatment with Keytruda  and Axitinib  that was started in March 2021.  He had 2 cycles of treatment but it was interrupted due to autoimmune hepatitis.  He then started on 5 mg daily of  axitinib  on 07/05/2020 but this was also discontinued after 1 week due to elevated LFTs.   He underwent cryoablation to the left renal mass on 05/30/2021.   He then underwent treatment with Keytruda .  He was initially on 200 mg IV every 3 weeks on 07/27/2021 but then was switched to 400 mg every 6 weeks starting from 11/09/2021.   He was found to have a solitary right cerebellar brain lesion in November 2024 and underwent SRS on 10/10/2023 followed by craniotomy on 10/13/2023.  He was recently found to have disease progression and February 2024 with a lytic lesion  in the upper sternum.  The patient is planning to to this lesion under the care of Dr. Lorri Rota as well as the T9 lesion.  The last day radiation is scheduled for 01/16/24   The patient was started back on treatment and he is status post his first cycle of IV nivolumab  480 mg every 4 weeks in addition to his oral treatment with Cabometyx  40 mg p.o. daily.    The patient has been struggling with elevated LFTs.  His nivolumab  was held and he continued on Cavo and he continued to have persistent LFTs.  Therefore at his last appointment he held his [place] and his LFTs are improving at this time.  Therefore it is likely his elevated LFTs are from his [place].   This was held. His LFTs have *** at this time.   The patient was seen with Dr. [name]. Dr. [name] recommends resuming the cabometryx with 20 mg.   We will arrange for repeat labs in 2 weeks.   I will arrange for a restaging CT scan of the CAP prior to his next cycle of treatment.   Dry mouth due to medication Dry mouth persists but sensitivity improved, allowing better food intake and weight gain.   Gastrointestinal bleeding Single episode of bright red blood per rectum attributed to hard bowel movement. No further episodes. - Follow up with gastroenterology to ensure appointment scheduling.  The patient was advised to call immediately if he has any concerning symptoms in the  interval. The patient voices understanding of current disease status and treatment options and is in agreement with the current care plan. All questions were answered. The patient knows to call the clinic with any problems, questions or concerns. We can certainly see the patient much sooner if necessary    No orders of the defined types were placed in this encounter.    I spent {CHL ONC TIME VISIT - ZOXWR:[id number]} counseling the patient face to face. The total time spent in the appointment was {CHL ONC TIME VISIT - BJYNW:[id number]}.  Donia Yokum L Jacolby Risby, PA-C 03/26/24

## 2024-03-28 ENCOUNTER — Other Ambulatory Visit: Payer: Self-pay

## 2024-03-30 DIAGNOSIS — J189 Pneumonia, unspecified organism: Secondary | ICD-10-CM | POA: Diagnosis not present

## 2024-03-30 DIAGNOSIS — R509 Fever, unspecified: Secondary | ICD-10-CM | POA: Diagnosis not present

## 2024-03-30 DIAGNOSIS — R0981 Nasal congestion: Secondary | ICD-10-CM | POA: Diagnosis not present

## 2024-03-30 DIAGNOSIS — R051 Acute cough: Secondary | ICD-10-CM | POA: Diagnosis not present

## 2024-03-31 ENCOUNTER — Other Ambulatory Visit: Payer: Self-pay | Admitting: Physician Assistant

## 2024-03-31 ENCOUNTER — Telehealth: Payer: Self-pay | Admitting: *Deleted

## 2024-03-31 DIAGNOSIS — C649 Malignant neoplasm of unspecified kidney, except renal pelvis: Secondary | ICD-10-CM

## 2024-03-31 NOTE — Telephone Encounter (Signed)
 Returned PC to patient, he states he was dx'd with pneumonia yesterday & started on abx.  He is coughing & has nasal congestion. He has appointments for lab/MD/infusion tomorrow.   C. Heilingoetter PA informed, she would like to defer tx for one week.  Patient states he is out of town next week.  Will try to schedule for June 9.  Scheduling message sent.

## 2024-04-01 ENCOUNTER — Other Ambulatory Visit

## 2024-04-01 ENCOUNTER — Ambulatory Visit: Admitting: Physician Assistant

## 2024-04-01 ENCOUNTER — Ambulatory Visit

## 2024-04-02 ENCOUNTER — Encounter: Payer: Self-pay | Admitting: Internal Medicine

## 2024-04-03 ENCOUNTER — Other Ambulatory Visit: Payer: Self-pay

## 2024-04-05 ENCOUNTER — Telehealth: Payer: Self-pay

## 2024-04-05 ENCOUNTER — Other Ambulatory Visit: Payer: Self-pay

## 2024-04-05 NOTE — Telephone Encounter (Signed)
 Patient called and stated that he needed to reschedule his appts.  Message sent to scheduling department.

## 2024-04-06 ENCOUNTER — Other Ambulatory Visit

## 2024-04-06 ENCOUNTER — Ambulatory Visit: Admitting: Internal Medicine

## 2024-04-06 ENCOUNTER — Ambulatory Visit

## 2024-04-12 ENCOUNTER — Ambulatory Visit

## 2024-04-13 ENCOUNTER — Telehealth: Payer: Self-pay | Admitting: Radiation Oncology

## 2024-04-13 ENCOUNTER — Telehealth: Payer: Self-pay

## 2024-04-13 ENCOUNTER — Other Ambulatory Visit: Payer: Self-pay | Admitting: Urology

## 2024-04-13 DIAGNOSIS — C649 Malignant neoplasm of unspecified kidney, except renal pelvis: Secondary | ICD-10-CM

## 2024-04-13 MED ORDER — LORAZEPAM 0.5 MG PO TABS: ORAL_TABLET | ORAL | 0 refills | Status: DC

## 2024-04-13 NOTE — Telephone Encounter (Signed)
 RN returned call to patient asking for prescription for Ativan  to take prior to MRI procedure tomorrow 04/14/2024 at 11 am.  Keitha Pata, PA-C made aware and that he would like script to go to [facility] pharmacy on [address].

## 2024-04-13 NOTE — Telephone Encounter (Signed)
 6/10 Patient called to requested a copy of his medical record from [facility] to be sent to his mychart.  Medical Record sent as requested.

## 2024-04-14 ENCOUNTER — Ambulatory Visit: Payer: Self-pay | Admitting: Radiation Oncology

## 2024-04-14 ENCOUNTER — Ambulatory Visit (HOSPITAL_COMMUNITY)
Admission: RE | Admit: 2024-04-14 | Discharge: 2024-04-14 | Disposition: A | Discharge status: Home / Self Care | Source: Ambulatory Visit | Attending: Radiation Oncology | Admitting: Radiation Oncology

## 2024-04-14 DIAGNOSIS — C7931 Secondary malignant neoplasm of brain: Secondary | ICD-10-CM | POA: Diagnosis not present

## 2024-04-14 DIAGNOSIS — C801 Malignant (primary) neoplasm, unspecified: Secondary | ICD-10-CM | POA: Diagnosis not present

## 2024-04-14 MED ORDER — GADOBUTROL 1 MMOL/ML IV SOLN
10.0000 mL | Freq: Once | INTRAVENOUS | Status: AC | PRN
  Administered 2024-04-14: 10 mL via INTRAVENOUS

## 2024-04-14 NOTE — Progress Notes (Addendum)
 [facility]   Telephone:(336) [phone] [phone]) [phone]    Patient Care Team: [name], MD as PCP - General (Internal Medicine)   CHIEF COMPLAINT: Stage IV clear-cell renal cell carcinoma     CURRENT THERAPY: 1) Opdivo  480 mg IV every 4 weeks and Cabometyx  40 mg p.o. daily first dose on 12/30/23. Status post 1 cycle of treatment.  2) SBRT to the right chest wall/sternum and T9 under the care of Dr. Lorri Rota starting next week. Last dose on 01/16/24 3) Xgeva once the patient obtains dental clearance  INTERVAL HISTORY Ms. Manlove returns for follow up as scheduled. Last seen with cycle 2 Nivo 4/23. He rescheduled due to being out of town. 3 weeks ago he developed mild cough and fever, PCP diagnosed pneumonia, completed antibiotics. He has mild residual throat clearing/cough. He never felt that bad and feels well today. Played tennis this week, ROM is very good. Also closing on a new house today. Denies pain, n/v/c/d, dyspnea, headache, dizziness, or other specific complaints.   ROS  All other systems reviewed and negative  Past Medical History:  Diagnosis Date   Cancer (HCC)    Renal cell carcinoma   Clear cell renal cell carcinoma (HCC)    Hypertension    Hypothyroidism    Sleep apnea    wears cpap     Past Surgical History:  Procedure Laterality Date   APPLICATION OF CRANIAL NAVIGATION N/A 10/13/2023   Procedure: APPLICATION OF CRANIAL NAVIGATION;  Surgeon: Augusto Blonder, MD;  Location: [facility] OR;  Service: Neurosurgery;  Laterality: N/A;   CRANIOTOMY N/A 10/13/2023   Procedure: SUBOCCIPITAL   CRANIOTOMY TUMOR EXCISION;  Surgeon: Augusto Blonder, MD;  Location: [facility] OR;  Service: Neurosurgery;  Laterality: N/A;   IR RADIOLOGIST EVAL & MGMT  04/04/2021   no procedures performed   IR RADIOLOGIST EVAL & MGMT  05/08/2021   no procedures performed   IR RADIOLOGIST EVAL & MGMT  08/12/2022   LUNG BIOPSY  2021   RADIOLOGY WITH ANESTHESIA N/A 05/30/2021    Procedure: CT WITH ANESTHESIA CRYOABLATION;  Surgeon: Erica Hau, MD;  Location: [facility];  Service: Radiology;  Laterality: N/A;   right nephrectomy Right 08/2019   TONSILLECTOMY       Outpatient Encounter Medications as of 04/15/2024  Medication Sig   doxycycline  (VIBRA -TABS) 100 MG tablet Take 1 tablet (100 mg total) by mouth 2 (two) times daily.   amLODipine  (NORVASC ) 5 MG tablet Take 7.5 mg by mouth daily before breakfast.   cabozantinib  (CABOMETYX ) 40 MG tablet Take 1 tablet (40 mg total) by mouth daily. Take on an empty stomach, 1 hour before or 2 hours after meals.   cyclobenzaprine (FLEXERIL) 10 MG tablet Take 1 tablet po 2x a day prn Orally as directed for 30 days   fluconazole (DIFLUCAN) 150 MG tablet Take 150 mg by mouth daily. (Patient not taking: Reported on 04/15/2024)   levothyroxine  (SYNTHROID ) 137 MCG tablet TAKE 1 TABLET(137 MCG) BY MOUTH DAILY BEFORE BREAKFAST   LORazepam  (ATIVAN ) 0.5 MG tablet 1 tablet p.o. 30 minutes before the MRI scan and may repeat x 1, just prior to scan if needed.   magic mouthwash (nystatin , diphenhydrAMINE, alum & mag hydroxide) suspension mixture Take 5 mLs by mouth 3 (three) times daily as needed for mouth pain. (Suspension contains equal amounts of Maalox Extra Strength, nystatin , and diphenhydramine.)   methocarbamol  (ROBAXIN ) 500 MG tablet Take 500 mg by mouth every 8 (eight) hours as  needed for muscle spasms. (Patient not taking: Reported on 04/15/2024)   methylPREDNISolone  (MEDROL  DOSEPAK) 4 MG TBPK tablet Use as instructed   montelukast  (SINGULAIR ) 10 MG tablet Take 10 mg by mouth daily before breakfast.   nystatin  (MYCOSTATIN ) 100000 UNIT/ML suspension Take by mouth. (Patient not taking: Reported on 01/15/2024)   ondansetron  (ZOFRAN ) 8 MG tablet Take 1 tablet (8 mg total) by mouth every 8 (eight) hours as needed for nausea or vomiting. (Patient not taking: Reported on 01/15/2024)   prochlorperazine  (COMPAZINE ) 10 MG tablet Take 1 tablet (10 mg  total) by mouth every 6 (six) hours as needed for nausea or vomiting. (Patient not taking: Reported on 01/15/2024)   tadalafil (CIALIS) 20 MG tablet Take 10-20 mg by mouth daily as needed for erectile dysfunction.   No facility-administered encounter medications on file as of 04/15/2024.     Today's Vitals   04/15/24 0843 04/15/24 0844  BP: 122/88   Pulse: 69   Resp: 17   Temp: 98.1 F (36.7 C)   SpO2: 98%   Weight: 254 lb (115.2 kg)   PainSc:  0-No pain   Body mass index is 41 kg/m.   ECOG PERFORMANCE STATUS: 0 - Asymptomatic  PHYSICAL EXAM GENERAL:alert, no distress and comfortable SKIN: no rash. Mild dermatitis at the chest  EYES: sclera clear NECK: without mass. Small palpable deformity at the sternum/1st rib LYMPH:  no palpable cervical or supraclavicular lymphadenopathy  LUNGS: clear with normal breathing effort HEART: regular rate & rhythm, no lower extremity edema ABDOMEN: abdomen soft, non-tender and normal bowel sounds NEURO: alert & oriented x 3 with fluent speech, no focal motor/sensory deficits    CBC    Latest Ref Rng & Units 04/15/2024    8:11 AM 03/12/2024    9:24 AM 02/25/2024    8:20 AM  CBC  WBC 4.0 - 10.5 K/uL 6.0  4.4  3.7   Hemoglobin 13.0 - 17.0 g/dL 40.9  81.1  91.4   Hematocrit 39.0 - 52.0 % 38.6  39.3  40.0   Platelets 150 - 400 K/uL 263  236  193       CMP     Latest Ref Rng & Units 04/15/2024    8:11 AM 03/12/2024    9:24 AM 02/25/2024    8:20 AM  CMP  Glucose 70 - 99 mg/dL 782  956  213   BUN 6 - 20 mg/dL 17  18  15    Creatinine 0.61 - 1.24 mg/dL 0.86  5.78  4.69   Sodium 135 - 145 mmol/L 142  140  139   Potassium 3.5 - 5.1 mmol/L 4.2  4.1  3.8   Chloride 98 - 111 mmol/L 109  109  108   CO2 22 - 32 mmol/L 26  25  26    Calcium 8.9 - 10.3 mg/dL 8.6  8.7  8.8   Total Protein 6.5 - 8.1 g/dL 6.6  6.5  6.4   Total Bilirubin 0.0 - 1.2 mg/dL 0.5  0.5  0.6   Alkaline Phos 38 - 126 U/L 79  84  85   AST 15 - 41 U/L 20  43  59   ALT 0 - 44 U/L  19  74  97       ASSESSMENT & PLAN: 58 yo male   Stage IV clear cell renal cell carcinoma, grade 3 -status post right nephrectomy on August 27, 2019 in [place]  and the final pathology showed clear-cell renal cell carcinoma nuclear  grade 3 with 10% necrosis and negative margin with the final pathologic stage of T2a, NX. -status post treatment with Keytruda  and axitinib  started March 2021 for 2 cycles interrupted because of autoimmune hepatitis.  -Axitinib  5 mg daily started July 05, 2020 discontinued after 1 week because of elevated LFTs. -status post cryoablation of left renal mass on May 30, 2021. -Keytruda  200 Mg IV every 3 weeks started July 27, 2021 and then switch it to 400 Mg every 6 weeks on November 09, 2021.  Last dose was given July 2024. -Solitary brain metastasis in November 2024 and was treated with SRS on 10/10/23  followed by craniotomy and surgical resection on October 13, 2023 by Dr. Nat Badger -Opdivo  480 mg IV every 4 weeks and Cabometyx  40 mg p.o. daily first dose on 12/30/23. Status post 1 cycle of treatment.  -SBRT to the right chest wall/sternum and T9 under the care of Dr. Lorri Rota starting next week. Last dose on 01/16/24 with clinical response    Disposition:  Mr. Ellis appears well, tolerating Nivo without difficulty. Recovering from recent pneumonia. Able to recover and function well with excellent performance status. There is no clinical evidence of disease progression.   Reviewed recent brain MRI which shows no recurrent/new metastasis. There is an enlarging fluid collection at the craniotomy site, pt is asymptomatic. Case to be reviewed in neuro tumor board.  Labs reviewed, LFTs normalized. He will remain off Cabometyx . He will proceed with cycle 3 single agent Nivolumab  today as scheduled and continue q28 days. He is being referred for restaging CT CAP. Pt seen with Dr. [name] who plans to consider adding lenvatinib if he has progression.   F/up 7/10  with scan results and cycle 4 Nivo.   Orders Placed This Encounter  Procedures   CT CHEST ABDOMEN PELVIS WO CONTRAST    Standing Status:   Future    Expected Date:   04/29/2024    Expiration Date:   04/15/2025    Preferred imaging location?:   [facility]    If indicated for the ordered procedure, I authorize the administration of oral contrast media per Radiology protocol:   Yes    Does the patient have a contrast media/X-ray dye allergy?:   No      All questions were answered. The patient knows to call the clinic with any problems, questions or concerns. No barriers to learning were detected.   Ilynn Stauffer K Shamarra Warda, NP 04/15/2024    ADDENDUM: Hematology/Oncology Attending: I had a face-to-face encounter with the patient.  I reviewed his record, labs and recommended his care plan.  This is a very pleasant 58 years old white male with a stage IV clear-cell renal cell carcinoma that was initially diagnosed as locally advanced disease in 2020 with evidence of pulmonary metastasis in 2021.  The patient status post several treatment in the past including right nephrectomy.  He was then treated with Keytruda  and axitinib  for around 6 months followed by cryoablation of left renal mass followed by treatment with Keytruda  again between January 2023 until July 2024 followed by [facility] and surgical resection of brain tumor.  He is currently on treatment with nivolumab  480 Mg IV every 4 weeks in addition to Cabometyx  that was started initially on December 30, 2023 but this was discontinued secondary to liver dysfunction and elevated liver enzymes.  The patient also underwent radiotherapy to right chest wall/sternum and T9 lesions.  He has been tolerating his treatment with nivolumab  fairly well. I recommended  for the patient to proceed with cycle #3 today as planned. We will see him back for follow-up visit in 4 weeks with repeat CT scan of the chest, abdomen and pelvis for restaging of his disease. The  patient was advised to call immediately if he has any other concerning symptoms in the interval. The total time spent in the appointment was 30 minutes including review of chart and various tests results, discussions about plan of care and coordination of care plan . Disclaimer: This note was dictated with voice recognition software. Similar sounding words can inadvertently be transcribed and may be missed upon review. Aurelio Blower, MD

## 2024-04-15 ENCOUNTER — Ambulatory Visit

## 2024-04-15 ENCOUNTER — Other Ambulatory Visit

## 2024-04-15 ENCOUNTER — Ambulatory Visit: Admitting: Nurse Practitioner

## 2024-04-15 ENCOUNTER — Encounter: Payer: Self-pay | Admitting: Nurse Practitioner

## 2024-04-15 ENCOUNTER — Inpatient Hospital Stay (HOSPITAL_BASED_OUTPATIENT_CLINIC_OR_DEPARTMENT_OTHER): Admitting: Nurse Practitioner

## 2024-04-15 ENCOUNTER — Inpatient Hospital Stay: Attending: Oncology

## 2024-04-15 ENCOUNTER — Inpatient Hospital Stay

## 2024-04-15 VITALS — BP 122/82 | HR 65 | Temp 98.5°F | Resp 16

## 2024-04-15 VITALS — BP 122/88 | HR 69 | Temp 98.1°F | Resp 17 | Wt 254.0 lb

## 2024-04-15 DIAGNOSIS — Z905 Acquired absence of kidney: Secondary | ICD-10-CM | POA: Diagnosis not present

## 2024-04-15 DIAGNOSIS — C649 Malignant neoplasm of unspecified kidney, except renal pelvis: Secondary | ICD-10-CM

## 2024-04-15 DIAGNOSIS — C7800 Secondary malignant neoplasm of unspecified lung: Secondary | ICD-10-CM | POA: Diagnosis not present

## 2024-04-15 DIAGNOSIS — C641 Malignant neoplasm of right kidney, except renal pelvis: Secondary | ICD-10-CM | POA: Diagnosis not present

## 2024-04-15 DIAGNOSIS — C7931 Secondary malignant neoplasm of brain: Secondary | ICD-10-CM | POA: Diagnosis not present

## 2024-04-15 DIAGNOSIS — Z79620 Long term (current) use of immunosuppressive biologic: Secondary | ICD-10-CM | POA: Diagnosis not present

## 2024-04-15 DIAGNOSIS — K754 Autoimmune hepatitis: Secondary | ICD-10-CM | POA: Insufficient documentation

## 2024-04-15 DIAGNOSIS — Z79899 Other long term (current) drug therapy: Secondary | ICD-10-CM | POA: Diagnosis not present

## 2024-04-15 DIAGNOSIS — Z5112 Encounter for antineoplastic immunotherapy: Secondary | ICD-10-CM | POA: Insufficient documentation

## 2024-04-15 LAB — CMP (CANCER CENTER ONLY)
ALT: 19 U/L (ref 0–44)
AST: 20 U/L (ref 15–41)
Albumin: 4 g/dL (ref 3.5–5.0)
Alkaline Phosphatase: 79 U/L (ref 38–126)
Anion gap: 7 (ref 5–15)
BUN: 17 mg/dL (ref 6–20)
CO2: 26 mmol/L (ref 22–32)
Calcium: 8.6 mg/dL — ABNORMAL LOW (ref 8.9–10.3)
Chloride: 109 mmol/L (ref 98–111)
Creatinine: 1.21 mg/dL (ref 0.61–1.24)
GFR, Estimated: >60 mL/min (ref >60)
Glucose, Bld: 104 mg/dL — ABNORMAL HIGH (ref 70–99)
Potassium: 4.2 mmol/L (ref 3.5–5.1)
Sodium: 142 mmol/L (ref 135–145)
Total Bilirubin: 0.5 mg/dL (ref 0.0–1.2)
Total Protein: 6.6 g/dL (ref 6.5–8.1)

## 2024-04-15 LAB — CBC WITH DIFFERENTIAL (CANCER CENTER ONLY)
Abs Immature Granulocytes: 0.01 10*3/uL (ref 0.00–0.07)
Basophils Absolute: 0.1 10*3/uL (ref 0.0–0.1)
Basophils Relative: 1 %
Eosinophils Absolute: 0.4 10*3/uL (ref 0.0–0.5)
Eosinophils Relative: 6 %
HCT: 38.6 % — ABNORMAL LOW (ref 39.0–52.0)
Hemoglobin: 12.6 g/dL — ABNORMAL LOW (ref 13.0–17.0)
Immature Granulocytes: 0 %
Lymphocytes Relative: 15 %
Lymphs Abs: 0.9 10*3/uL (ref 0.7–4.0)
MCH: 28.1 pg (ref 26.0–34.0)
MCHC: 32.6 g/dL (ref 30.0–36.0)
MCV: 86 fL (ref 80.0–100.0)
Monocytes Absolute: 0.6 10*3/uL (ref 0.1–1.0)
Monocytes Relative: 11 %
Neutro Abs: 4 10*3/uL (ref 1.7–7.7)
Neutrophils Relative %: 67 %
Platelet Count: 263 10*3/uL (ref 150–400)
RBC: 4.49 MIL/uL (ref 4.22–5.81)
RDW: 17.2 % — ABNORMAL HIGH (ref 11.5–15.5)
WBC Count: 6 10*3/uL (ref 4.0–10.5)
nRBC: 0 % (ref 0.0–0.2)

## 2024-04-15 LAB — TSH: TSH: 8.26 u[IU]/mL — ABNORMAL HIGH (ref 0.350–4.500)

## 2024-04-15 MED ORDER — SODIUM CHLORIDE 0.9 % IV SOLN: INTRAVENOUS | Status: DC

## 2024-04-15 MED ORDER — SODIUM CHLORIDE 0.9 % IV SOLN
480.0000 mg | Freq: Once | INTRAVENOUS | Status: AC
  Administered 2024-04-15: 480 mg via INTRAVENOUS
  Filled 2024-04-15: qty 48

## 2024-04-15 NOTE — Patient Instructions (Signed)
 [facility] - A DEPT OF [facility]  Discharge Instructions: Thank you for choosing [facility] to provide your oncology and hematology care.   If you have a lab appointment with the Cancer Center, please go directly to the Cancer Center and check in at the registration area.   Wear comfortable clothing and clothing appropriate for easy access to any Portacath or PICC line.   We strive to give you quality time with your provider. You may need to reschedule your appointment if you arrive late (15 or more minutes).  Arriving late affects you and other patients whose appointments are after yours.  Also, if you miss three or more appointments without notifying the office, you may be dismissed from the clinic at the provider's discretion.      For prescription refill requests, have your pharmacy contact our office and allow 72 hours for refills to be completed.    Today you received the following chemotherapy and/or immunotherapy agent: Nivolumab  (Opdivo )   To help prevent nausea and vomiting after your treatment, we encourage you to take your nausea medication as directed.  BELOW ARE SYMPTOMS THAT SHOULD BE REPORTED IMMEDIATELY: *FEVER GREATER THAN 100.4 F (38 C) OR HIGHER *CHILLS OR SWEATING *NAUSEA AND VOMITING THAT IS NOT CONTROLLED WITH YOUR NAUSEA MEDICATION *UNUSUAL SHORTNESS OF BREATH *UNUSUAL BRUISING OR BLEEDING *URINARY PROBLEMS (pain or burning when urinating, or frequent urination) *BOWEL PROBLEMS (unusual diarrhea, constipation, pain near the anus) TENDERNESS IN MOUTH AND THROAT WITH OR WITHOUT PRESENCE OF ULCERS (sore throat, sores in mouth, or a toothache) UNUSUAL RASH, SWELLING OR PAIN  UNUSUAL VAGINAL DISCHARGE OR ITCHING   Items with * indicate a potential emergency and should be followed up as soon as possible or go to the Emergency Department if any problems should occur.  Please show the CHEMOTHERAPY ALERT CARD or  IMMUNOTHERAPY ALERT CARD at check-in to the Emergency Department and triage nurse.  Should you have questions after your visit or need to cancel or reschedule your appointment, please contact [facility] - A DEPT OF [facility]  Dept: [phone]  and follow the prompts.  Office hours are 8:00 a.m. to 4:30 p.m. Monday - Friday. Please note that voicemails left after 4:00 p.m. may not be returned until the following business day.  We are closed weekends and major holidays. You have access to a nurse at all times for urgent questions. Please call the main number to the clinic Dept: [phone] and follow the prompts.   For any non-urgent questions, you may also contact your provider using MyChart. We now offer e-Visits for anyone 33 and older to request care online for non-urgent symptoms. For details visit [email or web address].   Also download the MyChart app! Go to the app store, search MyChart, open the app, select [facility], and log in with your MyChart username and password.

## 2024-04-16 ENCOUNTER — Encounter: Payer: Self-pay | Admitting: Internal Medicine

## 2024-04-16 LAB — T4: T4, Total: 7.9 ug/dL (ref 4.5–12.0)

## 2024-04-19 ENCOUNTER — Encounter

## 2024-04-20 NOTE — Progress Notes (Signed)
 Identity verified x2.  Patient's spouse Mrs. Calan Doren reports patient is unavailable for his 04/21/2024 telephone apt w/ Ashlyn Bruning PA-C. States They have already seen the  MRI results on patient's My Chart and that he is doing well. They will call if they need anything.  This concludes the interaction.  Avery Bodo, LPN

## 2024-04-21 ENCOUNTER — Ambulatory Visit
Admission: RE | Admit: 2024-04-21 | Discharge: 2024-04-21 | Disposition: A | Discharge status: Home / Self Care | Source: Ambulatory Visit | Attending: Urology | Admitting: Urology

## 2024-04-22 ENCOUNTER — Ambulatory Visit: Admitting: Physician Assistant

## 2024-04-22 ENCOUNTER — Other Ambulatory Visit

## 2024-04-22 ENCOUNTER — Other Ambulatory Visit: Payer: Self-pay | Admitting: Radiation Therapy

## 2024-04-22 ENCOUNTER — Ambulatory Visit

## 2024-04-22 DIAGNOSIS — C7931 Secondary malignant neoplasm of brain: Secondary | ICD-10-CM

## 2024-04-23 ENCOUNTER — Other Ambulatory Visit: Payer: Self-pay

## 2024-04-26 DIAGNOSIS — G4733 Obstructive sleep apnea (adult) (pediatric): Secondary | ICD-10-CM | POA: Diagnosis not present

## 2024-04-27 ENCOUNTER — Ambulatory Visit (INDEPENDENT_AMBULATORY_CARE_PROVIDER_SITE_OTHER): Admitting: Physician Assistant

## 2024-04-27 ENCOUNTER — Encounter: Payer: Self-pay | Admitting: Physician Assistant

## 2024-04-27 VITALS — BP 130/80 | HR 68 | Ht 66.0 in | Wt 251.0 lb

## 2024-04-27 DIAGNOSIS — C649 Malignant neoplasm of unspecified kidney, except renal pelvis: Secondary | ICD-10-CM

## 2024-04-27 DIAGNOSIS — Z01818 Encounter for other preprocedural examination: Secondary | ICD-10-CM

## 2024-04-27 DIAGNOSIS — Z905 Acquired absence of kidney: Secondary | ICD-10-CM | POA: Diagnosis not present

## 2024-04-27 DIAGNOSIS — Z85528 Personal history of other malignant neoplasm of kidney: Secondary | ICD-10-CM

## 2024-04-27 DIAGNOSIS — Z1211 Encounter for screening for malignant neoplasm of colon: Secondary | ICD-10-CM

## 2024-04-27 MED ORDER — NA SULFATE-K SULFATE-MG SULF 17.5-3.13-1.6 GM/177ML PO SOLN: 1.0000 | Freq: Once | ORAL | 0 refills | Status: AC

## 2024-04-27 NOTE — Progress Notes (Signed)
 Chief Complaint: Discuss colonoscopy  HPI:    Michael [name] is a 58 year old male with a past medical history as listed below including metastatic renal cell carcinoma, who was referred to me by Heilingoetter, Cassandr* for discussion of a colonoscopy.    01/27/2024 PET scan with hypermetabolic skeletal metastasis at the T9 vertebral body and at the junction of the new brain and first rib, no evidence of visceral metastasis or nodal metastasis in post right nephrectomy.    04/15/2024 patient saw oncology in regards to his stage IV clear-cell renal cell carcinoma.  He was on chemotherapy.  Status post right nephrectomy in October 2020.    Today, patient presents to clinic and tells me that he has had lots of PET scans and things since being diagnosed with renal cell carcinoma in 2020, but he has never had a colonoscopy.  Tells me that back in January he was on a new immunotherapy medicine that caused dryness all over and some constipation and during that time had 2 days of some bright red blood in his bowel movements, but has not seen this now for another 5 months.  But this is what spurred him on in addition to his wife being a nurse and encouraging him to get a colonoscopy.    Denies fever, chills, weight loss, nausea, vomiting, abdominal pain or change in bowel habits.  Past Medical History:  Diagnosis Date   Cancer (HCC)    Renal cell carcinoma   Clear cell renal cell carcinoma (HCC)    Hypertension    Hypothyroidism    Sleep apnea    wears cpap    Past Surgical History:  Procedure Laterality Date   APPLICATION OF CRANIAL NAVIGATION N/A 10/13/2023   Procedure: APPLICATION OF CRANIAL NAVIGATION;  Surgeon: Lanis Pupa, MD;  Location: [facility] OR;  Service: Neurosurgery;  Laterality: N/A;   CRANIOTOMY N/A 10/13/2023   Procedure: SUBOCCIPITAL   CRANIOTOMY TUMOR EXCISION;  Surgeon: Lanis Pupa, MD;  Location: [facility] OR;  Service: Neurosurgery;  Laterality: N/A;   IR RADIOLOGIST EVAL & MGMT   04/04/2021   no procedures performed   IR RADIOLOGIST EVAL & MGMT  05/08/2021   no procedures performed   IR RADIOLOGIST EVAL & MGMT  08/12/2022   LUNG BIOPSY  2021   RADIOLOGY WITH ANESTHESIA N/A 05/30/2021   Procedure: CT WITH ANESTHESIA CRYOABLATION;  Surgeon: Luverne Aran, MD;  Location: [facility];  Service: Radiology;  Laterality: N/A;   right nephrectomy Right 08/2019   TONSILLECTOMY      Current Outpatient Medications  Medication Sig Dispense Refill   amLODipine  (NORVASC ) 5 MG tablet Take 7.5 mg by mouth daily before breakfast.     levothyroxine  (SYNTHROID ) 137 MCG tablet TAKE 1 TABLET(137 MCG) BY MOUTH DAILY BEFORE BREAKFAST 30 tablet 2   montelukast  (SINGULAIR ) 10 MG tablet Take 10 mg by mouth daily before breakfast.     tadalafil (CIALIS) 20 MG tablet Take 10-20 mg by mouth daily as needed for erectile dysfunction.     cabozantinib  (CABOMETYX ) 40 MG tablet Take 1 tablet (40 mg total) by mouth daily. Take on an empty stomach, 1 hour before or 2 hours after meals. (Patient not taking: Reported on 04/27/2024) 30 tablet 3   cyclobenzaprine (FLEXERIL) 10 MG tablet Take 1 tablet po 2x a day prn Orally as directed for 30 days (Patient not taking: Reported on 04/27/2024)     doxycycline  (VIBRA -TABS) 100 MG tablet Take 1 tablet (100 mg total) by mouth 2 (two) times  daily. (Patient not taking: Reported on 04/27/2024) 20 tablet 0   fluconazole (DIFLUCAN) 150 MG tablet Take 150 mg by mouth daily. (Patient not taking: Reported on 04/27/2024)     LORazepam  (ATIVAN ) 0.5 MG tablet 1 tablet p.o. 30 minutes before the MRI scan and may repeat x 1, just prior to scan if needed. (Patient not taking: Reported on 04/27/2024) 10 tablet 0   magic mouthwash (nystatin , diphenhydrAMINE, alum & mag hydroxide) suspension mixture Take 5 mLs by mouth 3 (three) times daily as needed for mouth pain. (Suspension contains equal amounts of Maalox Extra Strength, nystatin , and diphenhydramine.) (Patient not taking: Reported on  04/27/2024) 210 mL 0   methocarbamol  (ROBAXIN ) 500 MG tablet Take 500 mg by mouth every 8 (eight) hours as needed for muscle spasms. (Patient not taking: Reported on 04/27/2024)     methylPREDNISolone  (MEDROL  DOSEPAK) 4 MG TBPK tablet Use as instructed (Patient not taking: Reported on 04/27/2024) 21 tablet 0   nystatin  (MYCOSTATIN ) 100000 UNIT/ML suspension Take by mouth. (Patient not taking: Reported on 04/27/2024)     ondansetron  (ZOFRAN ) 8 MG tablet Take 1 tablet (8 mg total) by mouth every 8 (eight) hours as needed for nausea or vomiting. (Patient not taking: Reported on 04/27/2024) 30 tablet 1   prochlorperazine  (COMPAZINE ) 10 MG tablet Take 1 tablet (10 mg total) by mouth every 6 (six) hours as needed for nausea or vomiting. (Patient not taking: Reported on 04/27/2024) 30 tablet 1   No current facility-administered medications for this visit.    Allergies as of 04/27/2024 - Review Complete 04/27/2024  Allergen Reaction Noted   Cleocin [clindamycin] Rash 09/06/2019   Penicillins Other (See Comments) 06/13/2020   Tape Rash 09/06/2019    History reviewed. No pertinent family history.  Social History   Socioeconomic History   Marital status: Married    Spouse name: Not on file   Number of children: Not on file   Years of education: Not on file   Highest education level: Not on file  Occupational History   Not on file  Tobacco Use   Smoking status: Never   Smokeless tobacco: Never  Vaping Use   Vaping status: Never Used  Substance and Sexual Activity   Alcohol  use: Not Currently    Comment: occasional beer or wine 1 x a month   Drug use: Never   Sexual activity: Yes    Birth control/protection: None  Other Topics Concern   Not on file  Social History Narrative   Not on file   Social Drivers of Health   Financial Resource Strain: Low Risk  (08/29/2019)   Received from [employer]   Overall Financial Resource Strain (CARDIA)    Difficulty of Paying Living Expenses:  Not hard at all  Food Insecurity: No Food Insecurity (01/21/2024)   Hunger Vital Sign    Worried About Running Out of Food in the Last Year: Never true    Ran Out of Food in the Last Year: Never true  Transportation Needs: No Transportation Needs (01/21/2024)   PRAPARE - Administrator, Civil Service (Medical): No    Lack of Transportation (Non-Medical): No  Physical Activity: Insufficiently Active (08/29/2019)   Received from [facility]   Exercise Vital Sign    On average, how many days per week do you engage in moderate to strenuous exercise (like a brisk walk)?: 7 days    On average, how many minutes do you engage in exercise at this level?: 20 min  Stress: No Stress Concern Present (08/29/2019)   Received from [facility] of Occupational Health - Occupational Stress Questionnaire    Feeling of Stress : Not at all  Social Connections: Unknown (05/11/2022)   Received from [facility]   Social Network    Social Network: Not on file  Intimate Partner Violence: Not At Risk (01/21/2024)   Humiliation, Afraid, Rape, and Kick questionnaire    Fear of Current or Ex-Partner: No    Emotionally Abused: No    Physically Abused: No    Sexually Abused: No    Review of Systems:    Constitutional: No weight loss, fever or chills Skin: No rash  Cardiovascular: No chest pain Respiratory: No SOB Gastrointestinal: See HPI and otherwise negative Genitourinary: No dysuria Neurological: No headache, dizziness or syncope Musculoskeletal: No new muscle or joint pain Hematologic: No bruising Psychiatric: No history of depression or anxiety   Physical Exam:  Vital signs: BP 130/80   Pulse 68   Ht 5' 6 (1.676 m)   Wt 251 lb (113.9 kg)   BMI 40.51 kg/m    Constitutional:   Pleasant obese male appears to be in NAD, Well developed, Well nourished, alert and cooperative Head:  Normocephalic and atraumatic. Eyes:   PEERL, EOMI. No icterus.  Conjunctiva pink. Ears:  Normal auditory acuity. Neck:  Supple Throat: Oral cavity and pharynx without inflammation, swelling or lesion.  Respiratory: Respirations even and unlabored. Lungs clear to auscultation bilaterally.   No wheezes, crackles, or rhonchi.  Cardiovascular: Normal S1, S2. No MRG. Regular rate and rhythm. No peripheral edema, cyanosis or pallor.  Gastrointestinal:  Soft, nondistended, nontender. No rebound or guarding. Normal bowel sounds. No appreciable masses or hepatomegaly. Rectal:  Not performed.  Msk:  Symmetrical without gross deformities. Without edema, no deformity or joint abnormality.  Neurologic:  Alert and  oriented x4;  grossly normal neurologically.  Skin:   Dry and intact without significant lesions or rashes. Psychiatric:  Demonstrates good judgement and reason without abnormal affect or behaviors.  RELEVANT LABS AND IMAGING: CBC    Component Value Date/Time   WBC 6.0 04/15/2024 0811   WBC 6.0 09/30/2023 1255   RBC 4.49 04/15/2024 0811   HGB 12.6 (L) 04/15/2024 0811   HCT 38.6 (L) 04/15/2024 0811   PLT 263 04/15/2024 0811   MCV 86.0 04/15/2024 0811   MCH 28.1 04/15/2024 0811   MCHC 32.6 04/15/2024 0811   RDW 17.2 (H) 04/15/2024 0811   LYMPHSABS 0.9 04/15/2024 0811   MONOABS 0.6 04/15/2024 0811   EOSABS 0.4 04/15/2024 0811   BASOSABS 0.1 04/15/2024 0811    CMP     Component Value Date/Time   NA 142 04/15/2024 0811   K 4.2 04/15/2024 0811   CL 109 04/15/2024 0811   CO2 26 04/15/2024 0811   GLUCOSE 104 (H) 04/15/2024 0811   BUN 17 04/15/2024 0811   CREATININE 1.21 04/15/2024 0811   CALCIUM 8.6 (L) 04/15/2024 0811   PROT 6.6 04/15/2024 0811   ALBUMIN 4.0 04/15/2024 0811   AST 20 04/15/2024 0811   ALT 19 04/15/2024 0811   ALKPHOS 79 04/15/2024 0811   BILITOT 0.5 04/15/2024 0811   GFRNONAA >60 04/15/2024 0811   GFRAA >60 08/01/2020 1317    Assessment: 1.  Screening for colorectal cancer: Patient is 44 never had screening for colon  cancer 2.  History of renal cell carcinoma: Status post right nephrectomy and on immunotherapy  Plan: 1.  Patient scheduled for  screening colonoscopy in the [facility] with Dr. Legrand.  Did provide the patient a detailed list of risks for the procedure and he agrees to proceed. Patient is appropriate for endoscopic procedure(s) in the ambulatory ([facility]) setting.  2.  Patient to follow in clinic per recommendations after time of procedure.  Delon Failing, PA-C [facility] 04/27/2024, 8:48 AM  Cc: Heilingoetter, Cassandr*

## 2024-04-27 NOTE — Patient Instructions (Signed)
 You have been scheduled for a colonoscopy. Please follow written instructions given to you at your visit today.   If you use inhalers (even only as needed), please bring them with you on the day of your procedure.  DO NOT TAKE 7 DAYS PRIOR TO TEST- Trulicity (dulaglutide) Ozempic, Wegovy (semaglutide) Mounjaro (tirzepatide) Bydureon Bcise (exanatide extended release)  DO NOT TAKE 1 DAY PRIOR TO YOUR TEST Rybelsus (semaglutide) Adlyxin (lixisenatide) Victoza (liraglutide) Byetta (exanatide) ____________________________________________________________________  _______________________________________________________  If your blood pressure at your visit was 140/90 or greater, please contact your primary care physician to follow up on this.  _______________________________________________________  If you are age 37 or older, your body mass index should be between 23-30. Your Body mass index is 40.51 kg/m. If this is out of the aforementioned range listed, please consider follow up with your Primary Care Provider.  If you are age 106 or younger, your body mass index should be between 19-25. Your Body mass index is 40.51 kg/m. If this is out of the aformentioned range listed, please consider follow up with your Primary Care Provider.   ________________________________________________________  The [facility] providers would like to encourage you to use MYCHART to communicate with providers for non-urgent requests or questions.  Due to long hold times on the telephone, sending your provider a message by [facility] may be a faster and more efficient way to get a response.  Please allow 48 business hours for a response.  Please remember that this is for non-urgent requests.  _______________________________________________________

## 2024-04-28 ENCOUNTER — Other Ambulatory Visit: Payer: Self-pay | Admitting: Internal Medicine

## 2024-04-28 DIAGNOSIS — C649 Malignant neoplasm of unspecified kidney, except renal pelvis: Secondary | ICD-10-CM

## 2024-04-29 ENCOUNTER — Other Ambulatory Visit

## 2024-04-29 ENCOUNTER — Ambulatory Visit: Admitting: Physician Assistant

## 2024-04-29 ENCOUNTER — Inpatient Hospital Stay

## 2024-04-29 ENCOUNTER — Ambulatory Visit (HOSPITAL_COMMUNITY)
Admission: RE | Admit: 2024-04-29 | Discharge: 2024-04-29 | Disposition: A | Discharge status: Home / Self Care | Source: Ambulatory Visit | Attending: Nurse Practitioner | Admitting: Nurse Practitioner

## 2024-04-29 ENCOUNTER — Ambulatory Visit

## 2024-04-29 DIAGNOSIS — C649 Malignant neoplasm of unspecified kidney, except renal pelvis: Secondary | ICD-10-CM

## 2024-04-29 DIAGNOSIS — Z905 Acquired absence of kidney: Secondary | ICD-10-CM | POA: Diagnosis not present

## 2024-04-29 DIAGNOSIS — Z5112 Encounter for antineoplastic immunotherapy: Secondary | ICD-10-CM | POA: Diagnosis not present

## 2024-04-29 DIAGNOSIS — C7931 Secondary malignant neoplasm of brain: Secondary | ICD-10-CM | POA: Insufficient documentation

## 2024-04-29 DIAGNOSIS — C7951 Secondary malignant neoplasm of bone: Secondary | ICD-10-CM | POA: Diagnosis not present

## 2024-04-29 DIAGNOSIS — Z79899 Other long term (current) drug therapy: Secondary | ICD-10-CM | POA: Diagnosis not present

## 2024-04-29 DIAGNOSIS — C7800 Secondary malignant neoplasm of unspecified lung: Secondary | ICD-10-CM | POA: Diagnosis not present

## 2024-04-29 DIAGNOSIS — K754 Autoimmune hepatitis: Secondary | ICD-10-CM | POA: Diagnosis not present

## 2024-04-29 DIAGNOSIS — Z79620 Long term (current) use of immunosuppressive biologic: Secondary | ICD-10-CM | POA: Diagnosis not present

## 2024-04-29 DIAGNOSIS — K769 Liver disease, unspecified: Secondary | ICD-10-CM | POA: Diagnosis not present

## 2024-04-29 DIAGNOSIS — C641 Malignant neoplasm of right kidney, except renal pelvis: Secondary | ICD-10-CM | POA: Diagnosis not present

## 2024-04-29 LAB — CBC WITH DIFFERENTIAL (CANCER CENTER ONLY)
Abs Immature Granulocytes: 0.01 10*3/uL (ref 0.00–0.07)
Basophils Absolute: 0 10*3/uL (ref 0.0–0.1)
Basophils Relative: 1 %
Eosinophils Absolute: 0.4 10*3/uL (ref 0.0–0.5)
Eosinophils Relative: 6 %
HCT: 40.8 % (ref 39.0–52.0)
Hemoglobin: 13.6 g/dL (ref 13.0–17.0)
Immature Granulocytes: 0 %
Lymphocytes Relative: 15 %
Lymphs Abs: 1 10*3/uL (ref 0.7–4.0)
MCH: 28.3 pg (ref 26.0–34.0)
MCHC: 33.3 g/dL (ref 30.0–36.0)
MCV: 84.8 fL (ref 80.0–100.0)
Monocytes Absolute: 0.5 10*3/uL (ref 0.1–1.0)
Monocytes Relative: 7 %
Neutro Abs: 4.6 10*3/uL (ref 1.7–7.7)
Neutrophils Relative %: 71 %
Platelet Count: 266 10*3/uL (ref 150–400)
RBC: 4.81 MIL/uL (ref 4.22–5.81)
RDW: 15.6 % — ABNORMAL HIGH (ref 11.5–15.5)
WBC Count: 6.5 10*3/uL (ref 4.0–10.5)
nRBC: 0 % (ref 0.0–0.2)

## 2024-04-29 LAB — CMP (CANCER CENTER ONLY)
ALT: 22 U/L (ref 0–44)
AST: 24 U/L (ref 15–41)
Albumin: 4.2 g/dL (ref 3.5–5.0)
Alkaline Phosphatase: 97 U/L (ref 38–126)
Anion gap: 6 (ref 5–15)
BUN: 12 mg/dL (ref 6–20)
CO2: 28 mmol/L (ref 22–32)
Calcium: 9 mg/dL (ref 8.9–10.3)
Chloride: 106 mmol/L (ref 98–111)
Creatinine: 1.33 mg/dL — ABNORMAL HIGH (ref 0.61–1.24)
GFR, Estimated: >60 mL/min (ref >60)
Glucose, Bld: 118 mg/dL — ABNORMAL HIGH (ref 70–99)
Potassium: 3.9 mmol/L (ref 3.5–5.1)
Sodium: 140 mmol/L (ref 135–145)
Total Bilirubin: 0.7 mg/dL (ref 0.0–1.2)
Total Protein: 7.1 g/dL (ref 6.5–8.1)

## 2024-04-29 NOTE — Progress Notes (Signed)
 ____________________________________________________________  Attending physician addendum:  Thank you for sending this case to me. I have reviewed the entire note and agree with the plan.  Since the prognosis of his metastatic renal cell cancer on current therapy is uncertain but might be favorable, it is reasonable to screen him for colorectal polyps and cancer.  Victory Brand, MD  ____________________________________________________________

## 2024-05-10 ENCOUNTER — Inpatient Hospital Stay: Admitting: Physician Assistant

## 2024-05-11 NOTE — Progress Notes (Unsigned)
 [facility] OFFICE PROGRESS NOTE  [name], MD [address] [place] [address]  DIAGNOSIS: Stage IV clear-cell renal cell carcinoma initially diagnosed as locally advanced disease in 2020 with evidence of pulmonary metastasis in 2021.   PRIOR THERAPY: 1) status post right nephrectomy on August 27, 2019 in [place]  and the final pathology showed clear-cell renal cell carcinoma nuclear grade 3 with 10% necrosis and negative margin with the final pathologic stage of T2a, NX. 2) status post treatment with Keytruda  and axitinib  started March 2021 for 2 cycles interrupted because of autoimmune hepatitis.  3) axitinib  5 mg daily started July 05, 2020 discontinued after 1 week because of elevated LFTs. 4) status post cryoablation of left renal mass on May 30, 2021. 5) Keytruda  200 Mg IV every 3 weeks started July 27, 2021 and then switch it to 400 Mg every 6 weeks on November 09, 2021.  Last dose was given July 2024. 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 Lanis 7) 2) SBRT to the right chest wall/sternum and T9 under the care of Dr. Patrcia starting next week. Last dose on 01/16/24  CURRENT THERAPY: 1) Opdivo  480 mg IV every 4 weeks and Cabometyx  40 mg p.o. daily first dose on 12/30/23. Status post 3 cycle of treatment.  2) Marzette once the patient obtains dental clearance***  INTERVAL HISTORY: Michael [name] 58 y.o. male returns to the clinic today for a follow up visit. The patient was last seen by my colleague Lacie and Dr. Sherrod on 04/15/24.   The patient had intolerance to cabometyx  with significantly elevated LFTs.  He also had stomatitis, rash, and cracking of his hands and feet.  This has been on hold for several weeks.  When he was last seen 3 weeks ago he continued on single agent immunotherapy with nivolumab .  He tolerates this well.  He  denies any fever, chills, or night sweats unless related to warm sleeping environment.  He denies any unusual shortness of breath, cough, or hemoptysis.  He denies any diarrhea or constipation.  Denies any nausea or vomiting.  Blood in the stool?  Being set up for screening colonoscopy.  He denies any rashes or skin changes.  He recently had a restaging CT scan.  He is here today for evaluation and to review his scan result prior to starting #4.    MEDICAL HISTORY: Past Medical History:  Diagnosis Date   Cancer [facility])    Renal cell carcinoma   Clear cell renal cell carcinoma (HCC)    Hypertension    Hypothyroidism    Sleep apnea    wears cpap    ALLERGIES:  is allergic to cleocin [clindamycin], penicillins, and tape.  MEDICATIONS:  Current Outpatient Medications  Medication Sig Dispense Refill   amLODipine  (NORVASC ) 5 MG tablet Take 7.5 mg by mouth daily before breakfast.     cabozantinib  (CABOMETYX ) 40 MG tablet Take 1 tablet (40 mg total) by mouth daily. Take on an empty stomach, 1 hour before or 2 hours after meals. (Patient not taking: Reported on 04/27/2024) 30 tablet 3   cyclobenzaprine (FLEXERIL) 10 MG tablet Take 1 tablet po 2x a day prn Orally as directed for 30 days (Patient not taking: Reported on 04/27/2024)     levothyroxine  (SYNTHROID ) 137 MCG tablet TAKE 1 TABLET(137 MCG) BY MOUTH DAILY BEFORE BREAKFAST 30 tablet 2   LORazepam  (  ATIVAN ) 0.5 MG tablet 1 tablet p.o. 30 minutes before the MRI scan and may repeat x 1, just prior to scan if needed. (Patient not taking: Reported on 04/27/2024) 10 tablet 0   methocarbamol  (ROBAXIN ) 500 MG tablet Take 500 mg by mouth every 8 (eight) hours as needed for muscle spasms. (Patient not taking: Reported on 04/27/2024)     montelukast  (SINGULAIR ) 10 MG tablet Take 10 mg by mouth daily before breakfast.     nystatin  (MYCOSTATIN ) 100000 UNIT/ML suspension Take by mouth. (Patient not taking: Reported on 04/27/2024)     ondansetron  (ZOFRAN ) 8 MG  tablet Take 1 tablet (8 mg total) by mouth every 8 (eight) hours as needed for nausea or vomiting. (Patient not taking: Reported on 04/27/2024) 30 tablet 1   tadalafil (CIALIS) 20 MG tablet Take 10-20 mg by mouth daily as needed for erectile dysfunction.     No current facility-administered medications for this visit.    SURGICAL HISTORY:  Past Surgical History:  Procedure Laterality Date   APPLICATION OF CRANIAL NAVIGATION N/A 10/13/2023   Procedure: APPLICATION OF CRANIAL NAVIGATION;  Surgeon: Lanis Pupa, MD;  Location: [facility] OR;  Service: Neurosurgery;  Laterality: N/A;   CRANIOTOMY N/A 10/13/2023   Procedure: SUBOCCIPITAL   CRANIOTOMY TUMOR EXCISION;  Surgeon: Lanis Pupa, MD;  Location: [facility] OR;  Service: Neurosurgery;  Laterality: N/A;   IR RADIOLOGIST EVAL & MGMT  04/04/2021   no procedures performed   IR RADIOLOGIST EVAL & MGMT  05/08/2021   no procedures performed   IR RADIOLOGIST EVAL & MGMT  08/12/2022   LUNG BIOPSY  2021   RADIOLOGY WITH ANESTHESIA N/A 05/30/2021   Procedure: CT WITH ANESTHESIA CRYOABLATION;  Surgeon: Luverne Aran, MD;  Location: [facility] ORS;  Service: Radiology;  Laterality: N/A;   right nephrectomy Right 08/2019   TONSILLECTOMY      REVIEW OF SYSTEMS:   Review of Systems  Constitutional: Negative for appetite change, chills, fatigue, fever and unexpected weight change.  HENT:   Negative for mouth sores, nosebleeds, sore throat and trouble swallowing.   Eyes: Negative for eye problems and icterus.  Respiratory: Negative for cough, hemoptysis, shortness of breath and wheezing.   Cardiovascular: Negative for chest pain and leg swelling.  Gastrointestinal: Negative for abdominal pain, constipation, diarrhea, nausea and vomiting.  Genitourinary: Negative for bladder incontinence, difficulty urinating, dysuria, frequency and hematuria.   Musculoskeletal: Negative for back pain, gait problem, neck pain and neck stiffness.  Skin: Negative for itching and  rash.  Neurological: Negative for dizziness, extremity weakness, gait problem, headaches, light-headedness and seizures.  Hematological: Negative for adenopathy. Does not bruise/bleed easily.  Psychiatric/Behavioral: Negative for confusion, depression and sleep disturbance. The patient is not nervous/anxious.     PHYSICAL EXAMINATION:  There were no vitals taken for this visit.  ECOG PERFORMANCE STATUS: {CHL ONC ECOG [id number]  Physical Exam  Constitutional: Oriented to person, place, and time and well-developed, well-nourished, and in no distress. No distress.  HENT:  Head: Normocephalic and atraumatic.  Mouth/Throat: Oropharynx is clear and moist. No oropharyngeal exudate.  Eyes: Conjunctivae are normal. Right eye exhibits no discharge. Left eye exhibits no discharge. No scleral icterus.  Neck: Normal range of motion. Neck supple.  Cardiovascular: Normal rate, regular rhythm, normal heart sounds and intact distal pulses.   Pulmonary/Chest: Effort normal and breath sounds normal. No respiratory distress. No wheezes. No rales.  Abdominal: Soft. Bowel sounds are normal. Exhibits no distension and no mass. There is no tenderness.  Musculoskeletal: Normal  range of motion. Exhibits no edema.  Lymphadenopathy:    No cervical adenopathy.  Neurological: Alert and oriented to person, place, and time. Exhibits normal muscle tone. Gait normal. Coordination normal.  Skin: Skin is warm and dry. No rash noted. Not diaphoretic. No erythema. No pallor.  Psychiatric: Mood, memory and judgment normal.  Vitals reviewed.  LABORATORY DATA: Lab Results  Component Value Date   WBC 6.5 04/29/2024   HGB 13.6 04/29/2024   HCT 40.8 04/29/2024   MCV 84.8 04/29/2024   PLT 266 04/29/2024      Chemistry      Component Value Date/Time   NA 140 04/29/2024 1505   K 3.9 04/29/2024 1505   CL 106 04/29/2024 1505   CO2 28 04/29/2024 1505   BUN 12 04/29/2024 1505   CREATININE 1.33 (H) 04/29/2024 1505       Component Value Date/Time   CALCIUM 9.0 04/29/2024 1505   ALKPHOS 97 04/29/2024 1505   AST 24 04/29/2024 1505   ALT 22 04/29/2024 1505   BILITOT 0.7 04/29/2024 1505       RADIOGRAPHIC STUDIES:  CT CHEST ABDOMEN PELVIS WO CONTRAST Result Date: 05/02/2024 CLINICAL DATA:  Metastatic renal cell carcinoma restaging, status post right nephrectomy * Tracking Code: BO * EXAM: CT CHEST, ABDOMEN AND PELVIS WITHOUT CONTRAST TECHNIQUE: Multidetector CT imaging of the chest, abdomen and pelvis was performed following the standard protocol without IV contrast. RADIATION DOSE REDUCTION: This exam was performed according to the departmental dose-optimization program which includes automated exposure control, adjustment of the mA and/or kV according to patient size and/or use of iterative reconstruction technique. COMPARISON:  PET-CT, 01/22/2024, CT chest abdomen pelvis, 12/25/2023 FINDINGS: CT CHEST FINDINGS Cardiovascular: Scattered aortic atherosclerosis. Normal heart size. No pericardial effusion. Mediastinum/Nodes: No enlarged mediastinal, hilar, or axillary lymph nodes. Thyroid  gland, trachea, and esophagus demonstrate no significant findings. Lungs/Pleura: New subtle ground-glass airspace opacity and interlobular septal thickening of the right apex (series 6, image 38). New heterogeneous and consolidative airspace opacity of the medial right costophrenic recess overlying adjacent vertebral body metastasis (series 6, image 104). Multiple tiny pulmonary nodules unchanged. No pleural effusion or pneumothorax. Musculoskeletal: Slightly diminished size of a lytic metastasis with a large soft tissue component involving the right aspect of the manubrium and articulation with the right first rib, measuring 6.3 x 4.0 cm, previously 7.0 x 4.6 cm (series 2, image 16). Unchanged lytic metastasis with soft tissue component of the anterior aspect of the T9 vertebral body measuring 2.9 x 2.3 cm (series 2, image 44). No  acute osseous findings. CT ABDOMEN PELVIS FINDINGS Hepatobiliary: No solid liver abnormality is seen. Multiple unchanged low-attenuation liver lesions, largest of which are clearly fluid attenuation cysts. No gallstones, gallbladder wall thickening, or biliary dilatation. Pancreas: Unremarkable. No pancreatic ductal dilatation or surrounding inflammatory changes. Spleen: Normal in size without significant abnormality. Adrenals/Urinary Tract: Adrenal glands are unremarkable. Status post right nephrectomy. Unchanged ablation site of the midportion of the left kidney (series 2, image 70). No calculi or hydronephrosis. Thickened, decompressed urinary bladder, likely secondary to chronic outlet obstruction. Stomach/Bowel: Stomach is within normal limits. Appendix appears normal. No evidence of bowel wall thickening, distention, or inflammatory changes. Vascular/Lymphatic: No significant vascular findings are present. No enlarged abdominal or pelvic lymph nodes. Reproductive: Prostatomegaly. Other: No abdominal wall hernia or abnormality. No ascites. Musculoskeletal: No acute osseous findings. IMPRESSION: 1. Slightly diminished size of a lytic metastasis with a large soft tissue component involving the right aspect of the manubrium and articulation  with the right first rib. 2. Unchanged lytic metastasis with soft tissue component of the anterior aspect of the T9 vertebral body. 3. New subtle ground-glass airspace opacity and interlobular septal thickening of the right apex. New heterogeneous and consolidative airspace opacity of the medial right costophrenic recess overlying adjacent vertebral body metastasis. Findings are consistent with developing radiation pneumonitis/fibrosis in the setting of local radiation therapy to adjacent osseous metastases. 4. Multiple tiny pulmonary nodules unchanged. 5. Status post right nephrectomy. Unchanged ablation site of the midportion of the left kidney. 6. Prostatomegaly. Aortic  Atherosclerosis (ICD10-I70.0). Electronically Signed   By: Marolyn JONETTA Jaksch M.D.   On: 05/02/2024 06:55   MR Brain W Wo Contrast Result Date: 04/14/2024 CLINICAL DATA:  Brain metastases, assess treatment response 3T SRS Protocol. History of metastatic clear cell renal cell carcinoma status post resection. EXAM: MRI HEAD WITHOUT AND WITH CONTRAST TECHNIQUE: Multiplanar, multiecho pulse sequences of the brain and surrounding structures were obtained without and with intravenous contrast. CONTRAST:  10mL GADAVIST  GADOBUTROL  1 MMOL/ML IV SOLN COMPARISON:  MRI of the brain dated January 13, 2024. FINDINGS: Brain: Postsurgical changes within the posterior cranial fossa are again demonstrated on the right. Patient is status post right suboccipital craniotomy for resection of lesion within the right cerebellar hemisphere. There is mild residual curvilinear enhancement again demonstrated along the posterior margin of the resection cavity, unchanged. There is no nodular or suspicious enhancement present. There are few scattered foci of increased T2 signal within the subcortical white matter of the cerebral hemispheres bilaterally. There are no findings suspicious for residual or recurrent metastatic disease. Vascular: Normal vascular flow voids. Skull and upper cervical spine: Right suboccipital craniectomy. There has been marginal interval enlargement of a loculated fluid collection overlying the craniectomy site. Sinuses/Orbits: Mild mucosal disease within the ethmoid and maxillary sinuses. The orbits are unremarkable. There is opacification of the mastoid air cells. Other: None. IMPRESSION: 1. Status post right suboccipital craniotomy for resection of a lesion within the right cerebellar hemisphere. No findings suspicious for residual or recurrent metastatic disease. 2. Marginal interval enlargement of a loculated fluid collection overlying the craniectomy site, likely representing a pseudomeningocele. Electronically Signed    By: Evalene Coho M.D.   On: 04/14/2024 13:52     ASSESSMENT/PLAN:  This is a very pleasant 59 year old male with stage IV clear-cell renal cell carcinoma. This was initially diagnosed with locally advanced disease in 2020. He had evidence of pulmonary metastases in 2021. He is status post right nephrectomy on 10/23/202020 in [place] . The final pathology showed clear-cell renal cell carcinoma nuclear grade 3 with 10% necrosis and negative margins. The final pathologic stage was T2a, NX.    The patient started treatment with Keytruda  and Axitinib  that was started in March 2021.  He had 2 cycles of treatment but it was interrupted due to autoimmune hepatitis.  He then started on 5 mg daily of axitinib  on 07/05/2020 but this was also discontinued after 1 week due to elevated LFTs.   He underwent cryoablation to the left renal mass on 05/30/2021.   He then underwent treatment with Keytruda .  He was initially on 200 mg IV every 3 weeks on 07/27/2021 but then was switched to 400 mg every 6 weeks starting from 11/09/2021.   He was found to have a solitary right cerebellar brain lesion in November 2024 and underwent SRS on 10/10/2023 followed by craniotomy on 10/13/2023.   He was recently found to have disease progression and February 2024 with a lytic  lesion in the upper sternum.  The patient is planning to to this lesion under the care of Dr. Patrcia as well as the T9 lesion.  The last day radiation was on 01/16/24  The patient was started back on treatment and he is status post his first cycle of IV nivolumab  480 mg every 4 weeks in addition to his oral treatment with Cabometyx  40 mg p.o. daily.   Patient had significantly elevated LFTs cabo this was discontinued/on hold.  The patient recently had a restaging CT scan.  The scan did not show any disease progression.  The patient will continue with nivolumab  today as scheduled.  LFTs?  Dr. Jeannett last note had indicated should he have disease  progression that he would consider him for ***Lenvima?   We will see him back with labs and follow up visti in 4 weeks.   The patient was advised to call immediately if she has any concerning symptoms in the interval. The patient voices understanding of current disease status and treatment options and is in agreement with the current care plan. All questions were answered. The patient knows to call the clinic with any problems, questions or concerns. We can certainly see the patient much sooner if necessary      No orders of the defined types were placed in this encounter.    I spent {CHL ONC TIME VISIT - DTPQU:[id number]} counseling the patient face to face. The total time spent in the appointment was {CHL ONC TIME VISIT - DTPQU:[id number]}.  Malahki Gasaway L Emilian Stawicki, PA-C 05/11/24

## 2024-05-13 ENCOUNTER — Other Ambulatory Visit: Payer: Self-pay | Admitting: Physician Assistant

## 2024-05-13 ENCOUNTER — Inpatient Hospital Stay: Admitting: Physician Assistant

## 2024-05-13 ENCOUNTER — Inpatient Hospital Stay: Attending: Oncology

## 2024-05-13 ENCOUNTER — Encounter: Payer: Self-pay | Admitting: Internal Medicine

## 2024-05-13 ENCOUNTER — Inpatient Hospital Stay

## 2024-05-13 VITALS — BP 131/89 | HR 72 | Temp 98.3°F | Resp 17 | Ht 66.0 in | Wt 252.0 lb

## 2024-05-13 DIAGNOSIS — C649 Malignant neoplasm of unspecified kidney, except renal pelvis: Secondary | ICD-10-CM

## 2024-05-13 DIAGNOSIS — Z79890 Hormone replacement therapy (postmenopausal): Secondary | ICD-10-CM | POA: Diagnosis not present

## 2024-05-13 DIAGNOSIS — C7801 Secondary malignant neoplasm of right lung: Secondary | ICD-10-CM | POA: Diagnosis not present

## 2024-05-13 DIAGNOSIS — C7931 Secondary malignant neoplasm of brain: Secondary | ICD-10-CM | POA: Insufficient documentation

## 2024-05-13 DIAGNOSIS — C7951 Secondary malignant neoplasm of bone: Secondary | ICD-10-CM | POA: Diagnosis not present

## 2024-05-13 DIAGNOSIS — Z9226 Personal history of immune checkpoint inhibitor therapy: Secondary | ICD-10-CM | POA: Diagnosis not present

## 2024-05-13 DIAGNOSIS — Z881 Allergy status to other antibiotic agents status: Secondary | ICD-10-CM | POA: Diagnosis not present

## 2024-05-13 DIAGNOSIS — N400 Enlarged prostate without lower urinary tract symptoms: Secondary | ICD-10-CM | POA: Insufficient documentation

## 2024-05-13 DIAGNOSIS — N3289 Other specified disorders of bladder: Secondary | ICD-10-CM | POA: Diagnosis not present

## 2024-05-13 DIAGNOSIS — Z79620 Long term (current) use of immunosuppressive biologic: Secondary | ICD-10-CM | POA: Diagnosis not present

## 2024-05-13 DIAGNOSIS — Z880 Allergy status to penicillin: Secondary | ICD-10-CM | POA: Insufficient documentation

## 2024-05-13 DIAGNOSIS — C641 Malignant neoplasm of right kidney, except renal pelvis: Secondary | ICD-10-CM | POA: Insufficient documentation

## 2024-05-13 DIAGNOSIS — K7689 Other specified diseases of liver: Secondary | ICD-10-CM | POA: Insufficient documentation

## 2024-05-13 DIAGNOSIS — Z905 Acquired absence of kidney: Secondary | ICD-10-CM | POA: Diagnosis not present

## 2024-05-13 DIAGNOSIS — Z5112 Encounter for antineoplastic immunotherapy: Secondary | ICD-10-CM | POA: Diagnosis not present

## 2024-05-13 DIAGNOSIS — Z79899 Other long term (current) drug therapy: Secondary | ICD-10-CM | POA: Insufficient documentation

## 2024-05-13 DIAGNOSIS — I700 Atherosclerosis of aorta: Secondary | ICD-10-CM | POA: Insufficient documentation

## 2024-05-13 DIAGNOSIS — R7989 Other specified abnormal findings of blood chemistry: Secondary | ICD-10-CM | POA: Insufficient documentation

## 2024-05-13 DIAGNOSIS — R21 Rash and other nonspecific skin eruption: Secondary | ICD-10-CM | POA: Insufficient documentation

## 2024-05-13 LAB — CBC WITH DIFFERENTIAL (CANCER CENTER ONLY)
Abs Immature Granulocytes: 0.02 K/uL (ref 0.00–0.07)
Basophils Absolute: 0 K/uL (ref 0.0–0.1)
Basophils Relative: 1 %
Eosinophils Absolute: 0.5 K/uL (ref 0.0–0.5)
Eosinophils Relative: 8 %
HCT: 38.1 % — ABNORMAL LOW (ref 39.0–52.0)
Hemoglobin: 12.8 g/dL — ABNORMAL LOW (ref 13.0–17.0)
Immature Granulocytes: 0 %
Lymphocytes Relative: 17 %
Lymphs Abs: 1.2 K/uL (ref 0.7–4.0)
MCH: 28.3 pg (ref 26.0–34.0)
MCHC: 33.6 g/dL (ref 30.0–36.0)
MCV: 84.1 fL (ref 80.0–100.0)
Monocytes Absolute: 0.6 K/uL (ref 0.1–1.0)
Monocytes Relative: 8 %
Neutro Abs: 4.7 K/uL (ref 1.7–7.7)
Neutrophils Relative %: 66 %
Platelet Count: 266 K/uL (ref 150–400)
RBC: 4.53 MIL/uL (ref 4.22–5.81)
RDW: 14.4 % (ref 11.5–15.5)
WBC Count: 7.1 K/uL (ref 4.0–10.5)
nRBC: 0 % (ref 0.0–0.2)

## 2024-05-13 LAB — CMP (CANCER CENTER ONLY)
ALT: 20 U/L (ref 0–44)
AST: 23 U/L (ref 15–41)
Albumin: 3.8 g/dL (ref 3.5–5.0)
Alkaline Phosphatase: 92 U/L (ref 38–126)
Anion gap: 6 (ref 5–15)
BUN: 14 mg/dL (ref 6–20)
CO2: 26 mmol/L (ref 22–32)
Calcium: 8.6 mg/dL — ABNORMAL LOW (ref 8.9–10.3)
Chloride: 107 mmol/L (ref 98–111)
Creatinine: 1.12 mg/dL (ref 0.61–1.24)
GFR, Estimated: >60 mL/min (ref >60)
Glucose, Bld: 103 mg/dL — ABNORMAL HIGH (ref 70–99)
Potassium: 3.9 mmol/L (ref 3.5–5.1)
Sodium: 139 mmol/L (ref 135–145)
Total Bilirubin: 0.6 mg/dL (ref 0.0–1.2)
Total Protein: 6.6 g/dL (ref 6.5–8.1)

## 2024-05-13 MED ORDER — SODIUM CHLORIDE 0.9 % IV SOLN
480.0000 mg | Freq: Once | INTRAVENOUS | Status: AC
  Administered 2024-05-13: 480 mg via INTRAVENOUS
  Filled 2024-05-13: qty 48

## 2024-05-13 MED ORDER — SODIUM CHLORIDE 0.9 % IV SOLN: INTRAVENOUS | Status: DC

## 2024-05-13 NOTE — Patient Instructions (Signed)
 [facility] - A DEPT OF [facility]  Discharge Instructions: Thank you for choosing [facility] to provide your oncology and hematology care.   If you have a lab appointment with the Cancer Center, please go directly to the Cancer Center and check in at the registration area.   Wear comfortable clothing and clothing appropriate for easy access to any Portacath or PICC line.   We strive to give you quality time with your provider. You may need to reschedule your appointment if you arrive late (15 or more minutes).  Arriving late affects you and other patients whose appointments are after yours.  Also, if you miss three or more appointments without notifying the office, you may be dismissed from the clinic at the provider's discretion.      For prescription refill requests, have your pharmacy contact our office and allow 72 hours for refills to be completed.    Today you received the following chemotherapy and/or immunotherapy agents opdivo      To help prevent nausea and vomiting after your treatment, we encourage you to take your nausea medication as directed.  BELOW ARE SYMPTOMS THAT SHOULD BE REPORTED IMMEDIATELY: *FEVER GREATER THAN 100.4 F (38 C) OR HIGHER *CHILLS OR SWEATING *NAUSEA AND VOMITING THAT IS NOT CONTROLLED WITH YOUR NAUSEA MEDICATION *UNUSUAL SHORTNESS OF BREATH *UNUSUAL BRUISING OR BLEEDING *URINARY PROBLEMS (pain or burning when urinating, or frequent urination) *BOWEL PROBLEMS (unusual diarrhea, constipation, pain near the anus) TENDERNESS IN MOUTH AND THROAT WITH OR WITHOUT PRESENCE OF ULCERS (sore throat, sores in mouth, or a toothache) UNUSUAL RASH, SWELLING OR PAIN  UNUSUAL VAGINAL DISCHARGE OR ITCHING   Items with * indicate a potential emergency and should be followed up as soon as possible or go to the Emergency Department if any problems should occur.  Please show the CHEMOTHERAPY ALERT CARD or IMMUNOTHERAPY  ALERT CARD at check-in to the Emergency Department and triage nurse.  Should you have questions after your visit or need to cancel or reschedule your appointment, please contact [facility] - A DEPT OF [facility]  Dept: [phone]  and follow the prompts.  Office hours are 8:00 a.m. to 4:30 p.m. Monday - Friday. Please note that voicemails left after 4:00 p.m. may not be returned until the following business day.  We are closed weekends and major holidays. You have access to a nurse at all times for urgent questions. Please call the main number to the clinic Dept: [phone] and follow the prompts.   For any non-urgent questions, you may also contact your provider using MyChart. We now offer e-Visits for anyone 74 and older to request care online for non-urgent symptoms. For details visit [email or web address].   Also download the MyChart app! Go to the app store, search "MyChart", open the app, select [facility], and log in with your MyChart username and password.

## 2024-05-14 ENCOUNTER — Other Ambulatory Visit: Payer: Self-pay | Admitting: Interventional Radiology

## 2024-05-14 ENCOUNTER — Other Ambulatory Visit (HOSPITAL_COMMUNITY)

## 2024-05-14 DIAGNOSIS — C641 Malignant neoplasm of right kidney, except renal pelvis: Secondary | ICD-10-CM

## 2024-05-14 DIAGNOSIS — C649 Malignant neoplasm of unspecified kidney, except renal pelvis: Secondary | ICD-10-CM

## 2024-05-18 ENCOUNTER — Other Ambulatory Visit: Payer: Self-pay | Admitting: Physician Assistant

## 2024-05-18 DIAGNOSIS — C7951 Secondary malignant neoplasm of bone: Secondary | ICD-10-CM | POA: Insufficient documentation

## 2024-05-18 NOTE — Progress Notes (Signed)
 I checked with Dr. Sherrod frequency of Michael [name] since the patient states his dentist was willing to send dental clearance for Xgeva. Dr. Sherrod recommends monthly of 6 months then every 3 months there on after. I have placed the order. I will reach out to scheduling to ensure his next infusion can accommodate the time for treatment and xgeva.

## 2024-05-20 ENCOUNTER — Other Ambulatory Visit

## 2024-05-20 ENCOUNTER — Ambulatory Visit

## 2024-05-20 ENCOUNTER — Ambulatory Visit: Admitting: Internal Medicine

## 2024-05-21 ENCOUNTER — Telehealth: Payer: Self-pay

## 2024-05-21 NOTE — Telephone Encounter (Signed)
 Received call from patient with complaints right pectoral pain 6/10.  Reports taking Extra Strength Tylenol  no longer effective and if there is something else Ashlyn would recommend.  Reports noticed a hardened area 3-4 inches from where his tumor was treated (sternum).  Requesting Ashlyn Bruning, PA-C or Dr. Patrcia take a look at his recent scans to make sure there no new tumors.  Request was sent to both Ashlyn and Dr. Patrcia.

## 2024-05-21 NOTE — Telephone Encounter (Signed)
 RN returned call to patient to inform him of Dr. Alline suggestions and new prescription Medrol  Dose pack will be sent per request to his pharmacy of choice to help with increased pain to right pectoral muscle.

## 2024-05-22 ENCOUNTER — Other Ambulatory Visit: Payer: Self-pay | Admitting: Urology

## 2024-05-22 MED ORDER — METHYLPREDNISOLONE 4 MG PO TBPK: ORAL_TABLET | ORAL | 1 refills | Status: DC

## 2024-05-24 ENCOUNTER — Ambulatory Visit
Admission: RE | Admit: 2024-05-24 | Discharge: 2024-05-24 | Disposition: A | Discharge status: Home / Self Care | Source: Ambulatory Visit | Attending: Interventional Radiology | Admitting: Interventional Radiology

## 2024-05-24 DIAGNOSIS — C641 Malignant neoplasm of right kidney, except renal pelvis: Secondary | ICD-10-CM

## 2024-05-24 DIAGNOSIS — C642 Malignant neoplasm of left kidney, except renal pelvis: Secondary | ICD-10-CM | POA: Diagnosis not present

## 2024-05-24 DIAGNOSIS — C649 Malignant neoplasm of unspecified kidney, except renal pelvis: Secondary | ICD-10-CM

## 2024-05-24 HISTORY — PX: IR RADIOLOGIST EVAL & MGMT: IMG5224

## 2024-05-24 NOTE — Progress Notes (Signed)
 Chief Complaint: Patient was consulted remotely today (TeleHealth) for follow up after cryoablation of a left renal carcinoma.   History of Present Illness: Michael [name] is a 58 y.o. male status post biopsy and cryoablation of a 3.2 cm lateral exophytic interpolar left renal mass on 05/30/2021.  Additional biopsy of an adjacent 2.2 cm deeper left renal sinus mass was also performed at that time.  Both masses demonstrated clear-cell renal carcinoma, nuclear grade 2 by pathology.  He tolerated cryoablation well without complication or postprocedural symptoms. Since ablation, he has undergone stereotactic radiosurgery and surgical resection of a right cerebellar metastasis by Dr. Lanis and SBRT to metastases of the manubrium and T9 vertebral bodies by Dr. Patrcia and Sabra Rusk, PA-C. He tolerated treatment well with some residual discomfort in the right pectoral region with activity and playing tennis after radiation.  Past Medical History:  Diagnosis Date   Cancer (HCC)    Renal cell carcinoma   Clear cell renal cell carcinoma (HCC)    Hypertension    Hypothyroidism    Sleep apnea    wears cpap    Past Surgical History:  Procedure Laterality Date   APPLICATION OF CRANIAL NAVIGATION N/A 10/13/2023   Procedure: APPLICATION OF CRANIAL NAVIGATION;  Surgeon: Lanis Pupa, MD;  Location: [facility];  Service: Neurosurgery;  Laterality: N/A;   CRANIOTOMY N/A 10/13/2023   Procedure: SUBOCCIPITAL   CRANIOTOMY TUMOR EXCISION;  Surgeon: Lanis Pupa, MD;  Location: [facility] OR;  Service: Neurosurgery;  Laterality: N/A;   IR RADIOLOGIST EVAL & MGMT  04/04/2021   no procedures performed   IR RADIOLOGIST EVAL & MGMT  05/08/2021   no procedures performed   IR RADIOLOGIST EVAL & MGMT  08/12/2022   LUNG BIOPSY  2021   RADIOLOGY WITH ANESTHESIA N/A 05/30/2021   Procedure: CT WITH ANESTHESIA CRYOABLATION;  Surgeon: Luverne Aran, MD;  Location: [facility];  Service: Radiology;  Laterality:  N/A;   right nephrectomy Right 08/2019   TONSILLECTOMY      Allergies: Cleocin [clindamycin], Penicillins, and Tape  Medications: Prior to Admission medications   Medication Sig Start Date End Date Taking? Authorizing Provider  amLODipine  (NORVASC ) 5 MG tablet Take 7.5 mg by mouth daily before breakfast. 04/23/21   [provider]  cabozantinib  (CABOMETYX ) 40 MG tablet Take 1 tablet (40 mg total) by mouth daily. Take on an empty stomach, 1 hour before or 2 hours after meals. Patient not taking: Reported on 04/27/2024 01/14/24   [name], MD  cyclobenzaprine (FLEXERIL) 10 MG tablet Take 1 tablet po 2x a day prn Orally as directed for 30 days Patient not taking: Reported on 04/27/2024 01/06/24   [provider]  levothyroxine  (SYNTHROID ) 137 MCG tablet TAKE 1 TABLET(137 MCG) BY MOUTH DAILY BEFORE BREAKFAST 03/16/24   Heilingoetter, Cassandra L, PA-C  LORazepam  (ATIVAN ) 0.5 MG tablet 1 tablet p.o. 30 minutes before the MRI scan and may repeat x 1, just prior to scan if needed. Patient not taking: Reported on 04/27/2024 04/13/24   Bruning, Ashlyn, PA-C  methocarbamol  (ROBAXIN ) 500 MG tablet Take 500 mg by mouth every 8 (eight) hours as needed for muscle spasms. Patient not taking: Reported on 04/27/2024    [provider]  methylPREDNISolone  (MEDROL  DOSEPAK) 4 MG TBPK tablet Take 6 tablets on day one, then five tablets on day 2, four tablets on day 3, three tablets on day 4 and two tablets on day 5 and one tablet on day 6 05/22/24   Bruning, Ashlyn,  PA-C  montelukast  (SINGULAIR ) 10 MG tablet Take 10 mg by mouth daily before breakfast.    [provider]  nystatin  (MYCOSTATIN ) 100000 UNIT/ML suspension Take by mouth. Patient not taking: Reported on 04/27/2024 10/18/23   [provider]  ondansetron  (ZOFRAN ) 8 MG tablet Take 1 tablet (8 mg total) by mouth every 8 (eight) hours as needed for nausea or vomiting. Patient not taking: Reported on 04/27/2024  12/15/23   [name], MD  tadalafil (CIALIS) 20 MG tablet Take 10-20 mg by mouth daily as needed for erectile dysfunction.    [provider]     Family History  Problem Relation Age of Onset   Liver disease Neg Hx    Colon cancer Neg Hx    Esophageal cancer Neg Hx     Social History   Socioeconomic History   Marital status: Married    Spouse name: Not on file   Number of children: 3   Years of education: Not on file   Highest education level: Not on file  Occupational History   Not on file  Tobacco Use   Smoking status: Never   Smokeless tobacco: Never  Vaping Use   Vaping status: Never Used  Substance and Sexual Activity   Alcohol  use: Not Currently    Comment: occasional beer or wine 1 x a month   Drug use: Never   Sexual activity: Yes    Birth control/protection: None  Other Topics Concern   Not on file  Social History Narrative   Not on file   Social Drivers of Health   Financial Resource Strain: Low Risk  (08/29/2019)   Received from [employer]   Overall Financial Resource Strain (CARDIA)    Difficulty of Paying Living Expenses: Not hard at all  Food Insecurity: No Food Insecurity (01/21/2024)   Hunger Vital Sign    Worried About Running Out of Food in the Last Year: Never true    Ran Out of Food in the Last Year: Never true  Transportation Needs: No Transportation Needs (01/21/2024)   PRAPARE - Administrator, Civil Service (Medical): No    Lack of Transportation (Non-Medical): No  Physical Activity: Insufficiently Active (08/29/2019)   Received from [facility]   Exercise Vital Sign    On average, how many days per week do you engage in moderate to strenuous exercise (like a brisk walk)?: 7 days    On average, how many minutes do you engage in exercise at this level?: 20 min  Stress: No Stress Concern Present (08/29/2019)   Received from [facility] -  Occupational Stress Questionnaire    Feeling of Stress : Not at all  Social Connections: Unknown (05/11/2022)   Received from [facility]   Social Network    Social Network: Not on file    ECOG Status: 1 - Symptomatic but completely ambulatory  Review of Systems  Constitutional: Negative.   Respiratory: Negative.    Cardiovascular: Negative.   Gastrointestinal: Negative.   Genitourinary: Negative.   Musculoskeletal:        Discomfort of right chest wall/pectoral region.  Neurological: Negative.     Review of Systems: A 12 point ROS discussed and pertinent positives are indicated in the HPI above.  All other systems are negative.    Physical Exam No direct physical exam was performed (except for noted visual exam findings with Video Visits).   Vital Signs: There were no vitals  taken for this visit.  Imaging: CT CHEST ABDOMEN PELVIS WO CONTRAST Result Date: 05/02/2024 CLINICAL DATA:  Metastatic renal cell carcinoma restaging, status post right nephrectomy * Tracking Code: BO * EXAM: CT CHEST, ABDOMEN AND PELVIS WITHOUT CONTRAST TECHNIQUE: Multidetector CT imaging of the chest, abdomen and pelvis was performed following the standard protocol without IV contrast. RADIATION DOSE REDUCTION: This exam was performed according to the departmental dose-optimization program which includes automated exposure control, adjustment of the mA and/or kV according to patient size and/or use of iterative reconstruction technique. COMPARISON:  PET-CT, 01/22/2024, CT chest abdomen pelvis, 12/25/2023 FINDINGS: CT CHEST FINDINGS Cardiovascular: Scattered aortic atherosclerosis. Normal heart size. No pericardial effusion. Mediastinum/Nodes: No enlarged mediastinal, hilar, or axillary lymph nodes. Thyroid  gland, trachea, and esophagus demonstrate no significant findings. Lungs/Pleura: New subtle ground-glass airspace opacity and interlobular septal thickening of the right apex (series 6, image 38). New  heterogeneous and consolidative airspace opacity of the medial right costophrenic recess overlying adjacent vertebral body metastasis (series 6, image 104). Multiple tiny pulmonary nodules unchanged. No pleural effusion or pneumothorax. Musculoskeletal: Slightly diminished size of a lytic metastasis with a large soft tissue component involving the right aspect of the manubrium and articulation with the right first rib, measuring 6.3 x 4.0 cm, previously 7.0 x 4.6 cm (series 2, image 16). Unchanged lytic metastasis with soft tissue component of the anterior aspect of the T9 vertebral body measuring 2.9 x 2.3 cm (series 2, image 44). No acute osseous findings. CT ABDOMEN PELVIS FINDINGS Hepatobiliary: No solid liver abnormality is seen. Multiple unchanged low-attenuation liver lesions, largest of which are clearly fluid attenuation cysts. No gallstones, gallbladder wall thickening, or biliary dilatation. Pancreas: Unremarkable. No pancreatic ductal dilatation or surrounding inflammatory changes. Spleen: Normal in size without significant abnormality. Adrenals/Urinary Tract: Adrenal glands are unremarkable. Status post right nephrectomy. Unchanged ablation site of the midportion of the left kidney (series 2, image 70). No calculi or hydronephrosis. Thickened, decompressed urinary bladder, likely secondary to chronic outlet obstruction. Stomach/Bowel: Stomach is within normal limits. Appendix appears normal. No evidence of bowel wall thickening, distention, or inflammatory changes. Vascular/Lymphatic: No significant vascular findings are present. No enlarged abdominal or pelvic lymph nodes. Reproductive: Prostatomegaly. Other: No abdominal wall hernia or abnormality. No ascites. Musculoskeletal: No acute osseous findings. IMPRESSION: 1. Slightly diminished size of a lytic metastasis with a large soft tissue component involving the right aspect of the manubrium and articulation with the right first rib. 2. Unchanged  lytic metastasis with soft tissue component of the anterior aspect of the T9 vertebral body. 3. New subtle ground-glass airspace opacity and interlobular septal thickening of the right apex. New heterogeneous and consolidative airspace opacity of the medial right costophrenic recess overlying adjacent vertebral body metastasis. Findings are consistent with developing radiation pneumonitis/fibrosis in the setting of local radiation therapy to adjacent osseous metastases. 4. Multiple tiny pulmonary nodules unchanged. 5. Status post right nephrectomy. Unchanged ablation site of the midportion of the left kidney. 6. Prostatomegaly. Aortic Atherosclerosis (ICD10-I70.0). Electronically Signed   By: Marolyn JONETTA Jaksch M.D.   On: 05/02/2024 06:55    Labs:  CBC: Recent Labs    03/12/24 0924 04/15/24 0811 04/29/24 1505 05/13/24 1417  WBC 4.4 6.0 6.5 7.1  HGB 13.1 12.6* 13.6 12.8*  HCT 39.3 38.6* 40.8 38.1*  PLT 236 263 266 266    COAGS: No results for input(s): INR, APTT in the last 8760 hours.  BMP: Recent Labs    03/12/24 0924 04/15/24 0811 04/29/24 1505 05/13/24 1417  NA 140 142 140 139  K 4.1 4.2 3.9 3.9  CL 109 109 106 107  CO2 25 26 28 26   GLUCOSE 106* 104* 118* 103*  BUN 18 17 12 14   CALCIUM 8.7* 8.6* 9.0 8.6*  CREATININE 1.10 1.21 1.33* 1.12  GFRNONAA >60 >60 >60 >60    LIVER FUNCTION TESTS: Recent Labs    03/12/24 0924 04/15/24 0811 04/29/24 1505 05/13/24 1417  BILITOT 0.5 0.5 0.7 0.6  AST 43* 20 24 23   ALT 74* 19 22 20   ALKPHOS 84 79 97 92  PROT 6.5 6.6 7.1 6.6  ALBUMIN 4.0 4.0 4.2 3.8     Assessment and Plan:  I spoke with Mr. Stettler by phone. The CT of the abdomen without contrast on 04/29/24 and PET from 01/22/24 do not demonstrate obvious recurrent renal carcinoma in the left kidney after prior ablation in 2022. However, neither modality is optimal for renal evaluation without contrast. He has not had an MRI of the abdomen since Sept., 2023. I told him that  I would like to perform an MRI of the abdomen with and without contrast later this year. His renal function is adequate to receive Gadolinium. He would like to wait until November or December due to his schedule, which should be fine.  Electronically Signed: Marcey ONEIDA Moan 05/24/2024, 9:00 AM    I spent a total of 15 Minutes in remote  clinical consultation, greater than 50% of which was counseling/coordinating care post ablation of a left renal carcinoma.    Visit type: Audio only (telephone). Audio (no video) only due to lack of video connection currently. Alternative for in-person consultation at [facility], [address] [place], [place], [place]. This visit type was conducted due to national recommendations for restrictions regarding the COVID-19 Pandemic (e.g. social distancing).  This format is felt to be most appropriate for this patient at this time.  All issues noted in this document were discussed and addressed.

## 2024-05-25 ENCOUNTER — Encounter: Payer: Self-pay | Admitting: Internal Medicine

## 2024-05-26 DIAGNOSIS — G4733 Obstructive sleep apnea (adult) (pediatric): Secondary | ICD-10-CM | POA: Diagnosis not present

## 2024-05-27 ENCOUNTER — Ambulatory Visit

## 2024-05-27 ENCOUNTER — Other Ambulatory Visit

## 2024-05-27 ENCOUNTER — Ambulatory Visit: Admitting: Internal Medicine

## 2024-06-03 ENCOUNTER — Other Ambulatory Visit: Payer: Self-pay | Admitting: Physician Assistant

## 2024-06-03 DIAGNOSIS — E039 Hypothyroidism, unspecified: Secondary | ICD-10-CM

## 2024-06-10 ENCOUNTER — Other Ambulatory Visit

## 2024-06-10 ENCOUNTER — Ambulatory Visit

## 2024-06-10 ENCOUNTER — Ambulatory Visit: Admitting: Internal Medicine

## 2024-06-10 NOTE — Progress Notes (Signed)
 Patient Care Team: Michael Rush, MD as PCP - General (Internal Medicine)  Clinic Day:  06/11/2024   Referring physician: [name], MD  ASSESSMENT & PLAN:   Assessment & Plan: Metastatic renal cell carcinoma to brain [facility]) Stage IV clear-cell renal cell carcinoma. This was initially diagnosed with locally advanced disease in 2020. He had evidence of pulmonary metastases in 2021. He is status post right nephrectomy on 10/23/202020 in [place] . The final pathology showed clear-cell renal cell carcinoma nuclear grade 3 with 10% necrosis and negative margins. The final pathologic stage was T2a, NX.   The patient started treatment with Keytruda  and Axitinib  that was started in March 2021.  He had 2 cycles of treatment but it was interrupted due to autoimmune hepatitis.  He then started on 5 mg daily of axitinib  on 07/05/2020 but this was also discontinued after 1 week due to elevated LFTs.  He underwent cryoablation to the left renal mass on 05/30/2021.  He then underwent treatment with Keytruda .  He was initially on 200 mg IV every 3 weeks on 07/27/2021 but then was switched to 400 mg every 6 weeks starting from 11/09/2021.  He was found to have a solitary right cerebellar brain lesion in November 2024 and underwent SRS on 10/10/2023 followed by craniotomy on 10/13/2023.  He was recently found to have disease progression and February 2024 with a lytic lesion in the upper sternum.  The patient is planning to to this lesion under the care of Dr. Patrcia as well as the T9 lesion.  The last day radiation was on 01/16/24.   The patient was started back on treatment and he is status post his first cycle of IV nivolumab  480 mg every 4 weeks in addition to his oral treatment with Cabometyx  40 mg p.o. daily.   Patient had significantly elevated LFTs cabo this was discontinued/on hold.  The patient recently had a restaging CT scan.  The scan did not show any disease progression. The patient will continue with  nivolumab  today as scheduled.  Continue with maintenance Opdivo  480 mg IV every 4 weeks.  Today cycle 5  day 1.    Bone lesion to chest Recently finished radiation to bony lesion of the chest and low back.  Now having tenderness in the area that was radiated.  He has noticed a subcutaneous nodule on adjacent to radiated aspect of upper chest.  It was affecting the ROM of his right shoulder.  He just finished a steroid taper which has helped the pain and ROM of the shoulder significantly.  Still has palpable nodule in the right upper chest.  Plan Labs reviewed. - CBC and CMP are unremarkable. He is scheduled for MRI of the brain on 07/16/2024. Due for PET scan (upon insurance approval) for the end of September. Labs and patient presentation are appropriate for treatment today. Proceed with cycle 5 of Opdivo . Labs/flush, follow-up, and cycle 6 as scheduled.  The patient understands the plans discussed today and is in agreement with them.  He knows to contact our office if he develops concerns prior to his next appointment.  I provided 25 minutes of face-to-face time during this encounter and > 50% was spent counseling as documented under my assessment and plan.    Michael FORBES Lessen, NP  [facility] - A DEPT OF [facility] [address] [place] [address] Dept: [phone] Dept Fax: [phone]   No orders of  the defined types were placed in this encounter.     CHIEF COMPLAINT:  CC: metastatic renal cell carcinoma to brain  Current Treatment:  1) Opdivo  480 mg IV every 4 weeks and Cabometyx  40 mg p.o. daily first dose on 12/30/23. Status post 3 cycle of treatment.  Cabometyx  has been on hold for several weeks due to elevated LFTs.  2) Xgeva  once the patient obtains dental clearance  INTERVAL HISTORY:  Michael [name] is here today for repeat clinical assessment. He was last seen by Cassie, PA on 05/13/2024.  He recently  completed radiation to a palpable bony lesion of the right upper chest and posterior thorax.  Has noticed regular subcutaneous nodule in the area that was previously radiated.  Was tender, affecting the range of motion of his right shoulder.  Did complete a steroid taper which helped a great deal with his pain and range of motion.  Has now started having some left sciatica pain in the left leg.  He denies chest pain, chest pressure, or shortness of breath. He denies headaches or visual disturbances. He denies abdominal pain, nausea, vomiting, or changes in bowel or bladder habits.  He denies fevers or chills. He denies pain. His appetite is good. His weight has increased 3 pounds over last 3 weeks.  I have reviewed the past medical history, past surgical history, social history and family history with the patient and they are unchanged from previous note.  ALLERGIES:  is allergic to cleocin [clindamycin], penicillins, and tape.  MEDICATIONS:  Current Outpatient Medications  Medication Sig Dispense Refill   amLODipine  (NORVASC ) 5 MG tablet Take 7.5 mg by mouth daily before breakfast.     cabozantinib  (CABOMETYX ) 40 MG tablet Take 1 tablet (40 mg total) by mouth daily. Take on an empty stomach, 1 hour before or 2 hours after meals. (Patient not taking: Reported on 04/27/2024) 30 tablet 3   cyclobenzaprine (FLEXERIL) 10 MG tablet Take 1 tablet po 2x a day prn Orally as directed for 30 days (Patient not taking: Reported on 04/27/2024)     levothyroxine  (SYNTHROID ) 137 MCG tablet TAKE 1 TABLET(137 MCG) BY MOUTH DAILY BEFORE BREAKFAST 30 tablet 2   LORazepam  (ATIVAN ) 0.5 MG tablet 1 tablet p.o. 30 minutes before the MRI scan and may repeat x 1, just prior to scan if needed. (Patient not taking: Reported on 04/27/2024) 10 tablet 0   methocarbamol  (ROBAXIN ) 500 MG tablet Take 500 mg by mouth every 8 (eight) hours as needed for muscle spasms. (Patient not taking: Reported on 04/27/2024)     methylPREDNISolone   (MEDROL  DOSEPAK) 4 MG TBPK tablet Take 6 tablets on day one, then five tablets on day 2, four tablets on day 3, three tablets on day 4 and two tablets on day 5 and one tablet on day 6 21 tablet 1   montelukast  (SINGULAIR ) 10 MG tablet Take 10 mg by mouth daily before breakfast.     nystatin  (MYCOSTATIN ) 100000 UNIT/ML suspension Take by mouth. (Patient not taking: Reported on 04/27/2024)     ondansetron  (ZOFRAN ) 8 MG tablet Take 1 tablet (8 mg total) by mouth every 8 (eight) hours as needed for nausea or vomiting. (Patient not taking: Reported on 04/27/2024) 30 tablet 1   tadalafil (CIALIS) 20 MG tablet Take 10-20 mg by mouth daily as needed for erectile dysfunction.     No current facility-administered medications for this visit.    HISTORY OF PRESENT ILLNESS:   Oncology History  Kidney cancer, primary, with metastasis from  kidney to other site [facility])  07/06/2021 Initial Diagnosis   Kidney cancer, primary, with metastasis from kidney to other site [facility])   07/27/2021 - 05/01/2022 Chemotherapy   Patient is on Treatment Plan : HEAD/NECK Pembrolizumab  Q21D     07/27/2021 - 01/21/2023 Chemotherapy   Patient is on Treatment Plan : HEAD/NECK Pembrolizumab  (200) q21d     01/30/2022 Cancer Staging   Staging form: Kidney, AJCC 8th Edition - Clinical: Stage IV (cTX, cNX, cM1) - Signed by Amadeo Windell SAILOR, MD on 01/30/2022   03/04/2023 - 06/03/2023 Chemotherapy   Patient is on Treatment Plan : LUNG Pembrolizumab  (400) q42d     12/30/2023 -  Chemotherapy   Patient is on Treatment Plan : RENAL CELL Nivolumab /Cabometyx  (480/40) q28d         REVIEW OF SYSTEMS:   Constitutional: Denies fevers, chills or abnormal weight loss Eyes: Denies blurriness of vision Ears, nose, mouth, throat, and face: Denies mucositis or sore throat Respiratory: Denies cough, dyspnea or wheezes Cardiovascular: Denies palpitation, chest discomfort or lower extremity swelling Gastrointestinal:  Denies nausea, heartburn or change in  bowel habits Skin: Denies abnormal skin rashes Lymphatics: Denies new lymphadenopathy or easy bruising Neurological:Denies numbness, tingling or new weaknesses Behavioral/Psych: Mood is stable, no new changes  Musculoskeletal: Palpable nodule to right upper chest, adjacent to previous area of radiation.  Slightly tender. All other systems were reviewed with the patient and are negative.   VITALS:   Today's Vitals   06/11/24 1318 06/11/24 1319  BP: 128/74   Pulse: 83   Resp: 16   Temp: 98.3 F (36.8 C)   TempSrc: Temporal   SpO2: 97%   Weight: 255 lb 6.4 oz (115.8 kg)   Height: 5' 6 (1.676 m)   PainSc:  0-No pain   Body mass index is 41.22 kg/m.   Wt Readings from Last 3 Encounters:  06/11/24 255 lb 6.4 oz (115.8 kg)  05/13/24 252 lb (114.3 kg)  04/27/24 251 lb (113.9 kg)    Body mass index is 41.22 kg/m.  Performance status (ECOG): 1 - Symptomatic but completely ambulatory  PHYSICAL EXAM:   GENERAL:alert, no distress and comfortable SKIN: skin color, texture, turgor are normal, no rashes or significant lesions EYES: normal, Conjunctiva are pink and non-injected, sclera clear OROPHARYNX:no exudate, no erythema and lips, buccal mucosa, and tongue normal  NECK: supple, thyroid  normal size, non-tender, without nodularity LYMPH:  no palpable lymphadenopathy in the cervical, axillary or inguinal LUNGS: clear to auscultation and percussion with normal breathing effort HEART: regular rate & rhythm and no murmurs and no lower extremity edema ABDOMEN:abdomen soft, non-tender and normal bowel sounds Musculoskeletal:no cyanosis of digits and no clubbing.  Small, round, subcutaneous nodule and right upper chest.  No redness or warmth associated with this.  No swelling noted.  No crepitus in intercostal space.  ROM and strength of right arm and shoulder are intact. NEURO: alert & oriented x 3 with fluent speech, no focal motor/sensory deficits  LABORATORY DATA:  I have reviewed  the data as listed    Component Value Date/Time   NA 140 06/11/2024 1306   K 3.7 06/11/2024 1306   CL 107 06/11/2024 1306   CO2 27 06/11/2024 1306   GLUCOSE 96 06/11/2024 1306   BUN 16 06/11/2024 1306   CREATININE 1.22 06/11/2024 1306   CALCIUM 8.8 (L) 06/11/2024 1306   PROT 7.1 06/11/2024 1306   ALBUMIN 4.2 06/11/2024 1306   AST 23 06/11/2024 1306   ALT 22  06/11/2024 1306   ALKPHOS 87 06/11/2024 1306   BILITOT 0.6 06/11/2024 1306   GFRNONAA >60 06/11/2024 1306   GFRAA >60 08/01/2020 1317   Lab Results  Component Value Date   WBC 6.1 06/11/2024   NEUTROABS 4.3 06/11/2024   HGB 13.0 06/11/2024   HCT 39.3 06/11/2024   MCV 82.7 06/11/2024   PLT 247 06/11/2024     RADIOGRAPHIC STUDIES: IR Radiologist Eval & Mgmt Result Date: 05/24/2024 CLINICAL DATA:  IR clinic follow-up. EXAM: IR EVAL AND MANAGEMENT COMPARISON:  None Available. FINDINGS: See dictated note in Epic. IMPRESSION: See dictated note in Epic. Electronically Signed   By: Marcey Moan M.D.   On: 05/24/2024 10:49

## 2024-06-11 ENCOUNTER — Inpatient Hospital Stay (HOSPITAL_BASED_OUTPATIENT_CLINIC_OR_DEPARTMENT_OTHER): Admitting: Nurse Practitioner

## 2024-06-11 ENCOUNTER — Inpatient Hospital Stay: Attending: Oncology

## 2024-06-11 ENCOUNTER — Inpatient Hospital Stay

## 2024-06-11 VITALS — BP 128/74 | HR 83 | Temp 98.3°F | Resp 16 | Ht 66.0 in | Wt 255.4 lb

## 2024-06-11 DIAGNOSIS — C7931 Secondary malignant neoplasm of brain: Secondary | ICD-10-CM | POA: Diagnosis not present

## 2024-06-11 DIAGNOSIS — Z880 Allergy status to penicillin: Secondary | ICD-10-CM | POA: Diagnosis not present

## 2024-06-11 DIAGNOSIS — Z881 Allergy status to other antibiotic agents status: Secondary | ICD-10-CM | POA: Insufficient documentation

## 2024-06-11 DIAGNOSIS — M25519 Pain in unspecified shoulder: Secondary | ICD-10-CM | POA: Insufficient documentation

## 2024-06-11 DIAGNOSIS — K754 Autoimmune hepatitis: Secondary | ICD-10-CM | POA: Diagnosis not present

## 2024-06-11 DIAGNOSIS — Z79890 Hormone replacement therapy (postmenopausal): Secondary | ICD-10-CM | POA: Insufficient documentation

## 2024-06-11 DIAGNOSIS — Z5112 Encounter for antineoplastic immunotherapy: Secondary | ICD-10-CM | POA: Diagnosis not present

## 2024-06-11 DIAGNOSIS — Z905 Acquired absence of kidney: Secondary | ICD-10-CM | POA: Insufficient documentation

## 2024-06-11 DIAGNOSIS — C7951 Secondary malignant neoplasm of bone: Secondary | ICD-10-CM

## 2024-06-11 DIAGNOSIS — Z923 Personal history of irradiation: Secondary | ICD-10-CM | POA: Insufficient documentation

## 2024-06-11 DIAGNOSIS — C649 Malignant neoplasm of unspecified kidney, except renal pelvis: Secondary | ICD-10-CM

## 2024-06-11 DIAGNOSIS — Z9226 Personal history of immune checkpoint inhibitor therapy: Secondary | ICD-10-CM | POA: Diagnosis not present

## 2024-06-11 DIAGNOSIS — C641 Malignant neoplasm of right kidney, except renal pelvis: Secondary | ICD-10-CM | POA: Diagnosis not present

## 2024-06-11 DIAGNOSIS — M79605 Pain in left leg: Secondary | ICD-10-CM | POA: Insufficient documentation

## 2024-06-11 DIAGNOSIS — Z79899 Other long term (current) drug therapy: Secondary | ICD-10-CM | POA: Insufficient documentation

## 2024-06-11 DIAGNOSIS — M899 Disorder of bone, unspecified: Secondary | ICD-10-CM | POA: Diagnosis not present

## 2024-06-11 DIAGNOSIS — C7800 Secondary malignant neoplasm of unspecified lung: Secondary | ICD-10-CM | POA: Insufficient documentation

## 2024-06-11 DIAGNOSIS — R7989 Other specified abnormal findings of blood chemistry: Secondary | ICD-10-CM | POA: Insufficient documentation

## 2024-06-11 LAB — CBC WITH DIFFERENTIAL (CANCER CENTER ONLY)
Abs Immature Granulocytes: 0.01 K/uL (ref 0.00–0.07)
Basophils Absolute: 0 K/uL (ref 0.0–0.1)
Basophils Relative: 1 %
Eosinophils Absolute: 0.3 K/uL (ref 0.0–0.5)
Eosinophils Relative: 5 %
HCT: 39.3 % (ref 39.0–52.0)
Hemoglobin: 13 g/dL (ref 13.0–17.0)
Immature Granulocytes: 0 %
Lymphocytes Relative: 16 %
Lymphs Abs: 1 K/uL (ref 0.7–4.0)
MCH: 27.4 pg (ref 26.0–34.0)
MCHC: 33.1 g/dL (ref 30.0–36.0)
MCV: 82.7 fL (ref 80.0–100.0)
Monocytes Absolute: 0.5 K/uL (ref 0.1–1.0)
Monocytes Relative: 8 %
Neutro Abs: 4.3 K/uL (ref 1.7–7.7)
Neutrophils Relative %: 70 %
Platelet Count: 247 K/uL (ref 150–400)
RBC: 4.75 MIL/uL (ref 4.22–5.81)
RDW: 13.6 % (ref 11.5–15.5)
WBC Count: 6.1 K/uL (ref 4.0–10.5)
nRBC: 0 % (ref 0.0–0.2)

## 2024-06-11 LAB — CMP (CANCER CENTER ONLY)
ALT: 22 U/L (ref 0–44)
AST: 23 U/L (ref 15–41)
Albumin: 4.2 g/dL (ref 3.5–5.0)
Alkaline Phosphatase: 87 U/L (ref 38–126)
Anion gap: 6 (ref 5–15)
BUN: 16 mg/dL (ref 6–20)
CO2: 27 mmol/L (ref 22–32)
Calcium: 8.8 mg/dL — ABNORMAL LOW (ref 8.9–10.3)
Chloride: 107 mmol/L (ref 98–111)
Creatinine: 1.22 mg/dL (ref 0.61–1.24)
GFR, Estimated: >60 mL/min (ref >60)
Glucose, Bld: 96 mg/dL (ref 70–99)
Potassium: 3.7 mmol/L (ref 3.5–5.1)
Sodium: 140 mmol/L (ref 135–145)
Total Bilirubin: 0.6 mg/dL (ref 0.0–1.2)
Total Protein: 7.1 g/dL (ref 6.5–8.1)

## 2024-06-11 MED ORDER — DENOSUMAB 120 MG/1.7ML ~~LOC~~ SOLN
120.0000 mg | Freq: Once | SUBCUTANEOUS | Status: AC
Start: 2024-06-11 — End: 2024-06-11
  Administered 2024-06-11: 120 mg via SUBCUTANEOUS

## 2024-06-11 MED ORDER — SODIUM CHLORIDE 0.9 % IV SOLN: INTRAVENOUS | Status: DC

## 2024-06-11 MED ORDER — SODIUM CHLORIDE 0.9 % IV SOLN
480.0000 mg | Freq: Once | INTRAVENOUS | Status: AC
  Administered 2024-06-11: 480 mg via INTRAVENOUS
  Filled 2024-06-11: qty 48

## 2024-06-11 NOTE — Patient Instructions (Signed)
 [facility] - A DEPT OF [facility]  Discharge Instructions: Thank you for choosing [facility] to provide your oncology and hematology care.   If you have a lab appointment with the Cancer Center, please go directly to the Cancer Center and check in at the registration area.   Wear comfortable clothing and clothing appropriate for easy access to any Portacath or PICC line.   We strive to give you quality time with your provider. You may need to reschedule your appointment if you arrive late (15 or more minutes).  Arriving late affects you and other patients whose appointments are after yours.  Also, if you miss three or more appointments without notifying the office, you may be dismissed from the clinic at the provider's discretion.      For prescription refill requests, have your pharmacy contact our office and allow 72 hours for refills to be completed.    Today you received the following chemotherapy and/or immunotherapy agents opdivo      To help prevent nausea and vomiting after your treatment, we encourage you to take your nausea medication as directed.  BELOW ARE SYMPTOMS THAT SHOULD BE REPORTED IMMEDIATELY: *FEVER GREATER THAN 100.4 F (38 C) OR HIGHER *CHILLS OR SWEATING *NAUSEA AND VOMITING THAT IS NOT CONTROLLED WITH YOUR NAUSEA MEDICATION *UNUSUAL SHORTNESS OF BREATH *UNUSUAL BRUISING OR BLEEDING *URINARY PROBLEMS (pain or burning when urinating, or frequent urination) *BOWEL PROBLEMS (unusual diarrhea, constipation, pain near the anus) TENDERNESS IN MOUTH AND THROAT WITH OR WITHOUT PRESENCE OF ULCERS (sore throat, sores in mouth, or a toothache) UNUSUAL RASH, SWELLING OR PAIN  UNUSUAL VAGINAL DISCHARGE OR ITCHING   Items with * indicate a potential emergency and should be followed up as soon as possible or go to the Emergency Department if any problems should occur.  Please show the CHEMOTHERAPY ALERT CARD or IMMUNOTHERAPY  ALERT CARD at check-in to the Emergency Department and triage nurse.  Should you have questions after your visit or need to cancel or reschedule your appointment, please contact [facility] - A DEPT OF [facility]  Dept: [phone]  and follow the prompts.  Office hours are 8:00 a.m. to 4:30 p.m. Monday - Friday. Please note that voicemails left after 4:00 p.m. may not be returned until the following business day.  We are closed weekends and major holidays. You have access to a nurse at all times for urgent questions. Please call the main number to the clinic Dept: [phone] and follow the prompts.   For any non-urgent questions, you may also contact your provider using MyChart. We now offer e-Visits for anyone 74 and older to request care online for non-urgent symptoms. For details visit [email or web address].   Also download the MyChart app! Go to the app store, search "MyChart", open the app, select [facility], and log in with your MyChart username and password.

## 2024-06-20 ENCOUNTER — Encounter: Payer: Self-pay | Admitting: Nurse Practitioner

## 2024-06-20 ENCOUNTER — Encounter: Payer: Self-pay | Admitting: Physician Assistant

## 2024-06-20 ENCOUNTER — Encounter: Payer: Self-pay | Admitting: Internal Medicine

## 2024-06-20 NOTE — Assessment & Plan Note (Signed)
 Stage IV clear-cell renal cell carcinoma. This was initially diagnosed with locally advanced disease in 2020. He had evidence of pulmonary metastases in 2021. He is status post right nephrectomy on 10/23/202020 in [place] . The final pathology showed clear-cell renal cell carcinoma nuclear grade 3 with 10% necrosis and negative margins. The final pathologic stage was T2a, NX.   The patient started treatment with Keytruda  and Axitinib  that was started in March 2021.  He had 2 cycles of treatment but it was interrupted due to autoimmune hepatitis.  He then started on 5 mg daily of axitinib  on 07/05/2020 but this was also discontinued after 1 week due to elevated LFTs.  He underwent cryoablation to the left renal mass on 05/30/2021.  He then underwent treatment with Keytruda .  He was initially on 200 mg IV every 3 weeks on 07/27/2021 but then was switched to 400 mg every 6 weeks starting from 11/09/2021.  He was found to have a solitary right cerebellar brain lesion in November 2024 and underwent SRS on 10/10/2023 followed by craniotomy on 10/13/2023.  He was recently found to have disease progression and February 2024 with a lytic lesion in the upper sternum.  The patient is planning to to this lesion under the care of Dr. Patrcia as well as the T9 lesion.  The last day radiation was on 01/16/24.   The patient was started back on treatment and he is status post his first cycle of IV nivolumab  480 mg every 4 weeks in addition to his oral treatment with Cabometyx  40 mg p.o. daily.   Patient had significantly elevated LFTs cabo this was discontinued/on hold.  The patient recently had a restaging CT scan.  The scan did not show any disease progression. The patient will continue with nivolumab  today as scheduled.  Continue with maintenance Opdivo  480 mg IV every 4 weeks.  Today cycle 5  day 1.

## 2024-06-23 ENCOUNTER — Other Ambulatory Visit: Payer: Self-pay

## 2024-06-23 DIAGNOSIS — C7931 Secondary malignant neoplasm of brain: Secondary | ICD-10-CM | POA: Diagnosis not present

## 2024-06-23 DIAGNOSIS — M5416 Radiculopathy, lumbar region: Secondary | ICD-10-CM | POA: Diagnosis not present

## 2024-06-23 DIAGNOSIS — Z6841 Body Mass Index (BMI) 40.0 and over, adult: Secondary | ICD-10-CM | POA: Diagnosis not present

## 2024-06-26 DIAGNOSIS — G4733 Obstructive sleep apnea (adult) (pediatric): Secondary | ICD-10-CM | POA: Diagnosis not present

## 2024-06-29 ENCOUNTER — Encounter: Admitting: Gastroenterology

## 2024-07-15 ENCOUNTER — Other Ambulatory Visit (HOSPITAL_COMMUNITY): Payer: Self-pay | Admitting: Neurosurgery

## 2024-07-15 DIAGNOSIS — M5416 Radiculopathy, lumbar region: Secondary | ICD-10-CM

## 2024-07-16 ENCOUNTER — Ambulatory Visit (HOSPITAL_COMMUNITY)
Admission: RE | Admit: 2024-07-16 | Discharge: 2024-07-16 | Discharge status: Home / Self Care | Source: Ambulatory Visit | Attending: Neurosurgery | Admitting: Neurosurgery

## 2024-07-16 ENCOUNTER — Ambulatory Visit (HOSPITAL_COMMUNITY)
Admission: RE | Admit: 2024-07-16 | Discharge: 2024-07-16 | Disposition: A | Discharge status: Home / Self Care | Source: Ambulatory Visit | Attending: Radiation Oncology | Admitting: Radiation Oncology

## 2024-07-16 DIAGNOSIS — C7931 Secondary malignant neoplasm of brain: Secondary | ICD-10-CM

## 2024-07-16 DIAGNOSIS — M5416 Radiculopathy, lumbar region: Secondary | ICD-10-CM | POA: Diagnosis not present

## 2024-07-16 DIAGNOSIS — M5126 Other intervertebral disc displacement, lumbar region: Secondary | ICD-10-CM

## 2024-07-16 MED ORDER — GADOBUTROL 1 MMOL/ML IV SOLN
10.0000 mL | Freq: Once | INTRAVENOUS | Status: AC | PRN
  Administered 2024-07-16: 10 mL via INTRAVENOUS

## 2024-07-19 ENCOUNTER — Inpatient Hospital Stay

## 2024-07-19 ENCOUNTER — Other Ambulatory Visit: Payer: Self-pay | Admitting: Internal Medicine

## 2024-07-19 ENCOUNTER — Inpatient Hospital Stay: Attending: Oncology

## 2024-07-19 ENCOUNTER — Telehealth: Payer: Self-pay

## 2024-07-19 ENCOUNTER — Inpatient Hospital Stay (HOSPITAL_BASED_OUTPATIENT_CLINIC_OR_DEPARTMENT_OTHER): Admitting: Internal Medicine

## 2024-07-19 VITALS — BP 113/63 | HR 67 | Temp 98.0°F | Resp 17 | Ht 66.0 in | Wt 247.0 lb

## 2024-07-19 DIAGNOSIS — C649 Malignant neoplasm of unspecified kidney, except renal pelvis: Secondary | ICD-10-CM

## 2024-07-19 DIAGNOSIS — Z880 Allergy status to penicillin: Secondary | ICD-10-CM | POA: Diagnosis not present

## 2024-07-19 DIAGNOSIS — Z905 Acquired absence of kidney: Secondary | ICD-10-CM | POA: Insufficient documentation

## 2024-07-19 DIAGNOSIS — C7931 Secondary malignant neoplasm of brain: Secondary | ICD-10-CM | POA: Diagnosis not present

## 2024-07-19 DIAGNOSIS — M5416 Radiculopathy, lumbar region: Secondary | ICD-10-CM | POA: Diagnosis not present

## 2024-07-19 DIAGNOSIS — R748 Abnormal levels of other serum enzymes: Secondary | ICD-10-CM | POA: Insufficient documentation

## 2024-07-19 DIAGNOSIS — G96198 Other disorders of meninges, not elsewhere classified: Secondary | ICD-10-CM | POA: Insufficient documentation

## 2024-07-19 DIAGNOSIS — E039 Hypothyroidism, unspecified: Secondary | ICD-10-CM

## 2024-07-19 DIAGNOSIS — M5116 Intervertebral disc disorders with radiculopathy, lumbar region: Secondary | ICD-10-CM | POA: Diagnosis not present

## 2024-07-19 DIAGNOSIS — K754 Autoimmune hepatitis: Secondary | ICD-10-CM | POA: Insufficient documentation

## 2024-07-19 DIAGNOSIS — Z79620 Long term (current) use of immunosuppressive biologic: Secondary | ICD-10-CM | POA: Insufficient documentation

## 2024-07-19 DIAGNOSIS — C641 Malignant neoplasm of right kidney, except renal pelvis: Secondary | ICD-10-CM | POA: Diagnosis not present

## 2024-07-19 DIAGNOSIS — M4726 Other spondylosis with radiculopathy, lumbar region: Secondary | ICD-10-CM | POA: Insufficient documentation

## 2024-07-19 DIAGNOSIS — Z881 Allergy status to other antibiotic agents status: Secondary | ICD-10-CM | POA: Insufficient documentation

## 2024-07-19 DIAGNOSIS — Z79899 Other long term (current) drug therapy: Secondary | ICD-10-CM | POA: Insufficient documentation

## 2024-07-19 DIAGNOSIS — C7800 Secondary malignant neoplasm of unspecified lung: Secondary | ICD-10-CM | POA: Insufficient documentation

## 2024-07-19 DIAGNOSIS — Z5112 Encounter for antineoplastic immunotherapy: Secondary | ICD-10-CM | POA: Insufficient documentation

## 2024-07-19 DIAGNOSIS — M5126 Other intervertebral disc displacement, lumbar region: Secondary | ICD-10-CM | POA: Diagnosis not present

## 2024-07-19 LAB — CMP (CANCER CENTER ONLY)
ALT: 25 U/L (ref 0–44)
AST: 22 U/L (ref 15–41)
Albumin: 3.7 g/dL (ref 3.5–5.0)
Alkaline Phosphatase: 72 U/L (ref 38–126)
Anion gap: 8 (ref 5–15)
BUN: 11 mg/dL (ref 6–20)
CO2: 26 mmol/L (ref 22–32)
Calcium: 8.6 mg/dL — ABNORMAL LOW (ref 8.9–10.3)
Chloride: 105 mmol/L (ref 98–111)
Creatinine: 1.3 mg/dL — ABNORMAL HIGH (ref 0.61–1.24)
GFR, Estimated: >60 mL/min (ref >60)
Glucose, Bld: 106 mg/dL — ABNORMAL HIGH (ref 70–99)
Potassium: 4.1 mmol/L (ref 3.5–5.1)
Sodium: 139 mmol/L (ref 135–145)
Total Bilirubin: 0.5 mg/dL (ref 0.0–1.2)
Total Protein: 7.2 g/dL (ref 6.5–8.1)

## 2024-07-19 LAB — CBC WITH DIFFERENTIAL (CANCER CENTER ONLY)
Abs Immature Granulocytes: 0.03 K/uL (ref 0.00–0.07)
Basophils Absolute: 0 K/uL (ref 0.0–0.1)
Basophils Relative: 1 %
Eosinophils Absolute: 0.6 K/uL — ABNORMAL HIGH (ref 0.0–0.5)
Eosinophils Relative: 7 %
HCT: 37.3 % — ABNORMAL LOW (ref 39.0–52.0)
Hemoglobin: 12 g/dL — ABNORMAL LOW (ref 13.0–17.0)
Immature Granulocytes: 0 %
Lymphocytes Relative: 14 %
Lymphs Abs: 1 K/uL (ref 0.7–4.0)
MCH: 25.4 pg — ABNORMAL LOW (ref 26.0–34.0)
MCHC: 32.2 g/dL (ref 30.0–36.0)
MCV: 79 fL — ABNORMAL LOW (ref 80.0–100.0)
Monocytes Absolute: 0.7 K/uL (ref 0.1–1.0)
Monocytes Relative: 9 %
Neutro Abs: 5.2 K/uL (ref 1.7–7.7)
Neutrophils Relative %: 69 %
Platelet Count: 429 K/uL — ABNORMAL HIGH (ref 150–400)
RBC: 4.72 MIL/uL (ref 4.22–5.81)
RDW: 14.6 % (ref 11.5–15.5)
WBC Count: 7.5 K/uL (ref 4.0–10.5)
nRBC: 0 % (ref 0.0–0.2)

## 2024-07-19 LAB — TSH: TSH: 61.6 u[IU]/mL — ABNORMAL HIGH (ref 0.350–4.500)

## 2024-07-19 MED ORDER — SODIUM CHLORIDE 0.9 % IV SOLN: INTRAVENOUS | Status: DC

## 2024-07-19 MED ORDER — LEVOTHYROXINE SODIUM 150 MCG PO TABS: 150.0000 ug | ORAL_TABLET | Freq: Every day | ORAL | 2 refills | Status: DC

## 2024-07-19 MED ORDER — SODIUM CHLORIDE 0.9 % IV SOLN
480.0000 mg | Freq: Once | INTRAVENOUS | Status: AC
  Administered 2024-07-19: 480 mg via INTRAVENOUS
  Filled 2024-07-19: qty 48

## 2024-07-19 NOTE — Telephone Encounter (Signed)
 Tried to reach patient in regards to lab results.  Per Dr. Sherrod, patients TSH levels are elevated.  Increasing Levothyroxine  to 150 mcg daily and sent new prescription to pharmacy. Left message and asked for a return call if any concerns or questions.

## 2024-07-19 NOTE — Progress Notes (Signed)
 [facility] Telephone:([phone]   [phone]  OFFICE PROGRESS NOTE  Michael Rush, MD [address] [place] [address]  DIAGNOSIS: Stage IV clear-cell renal cell carcinoma initially diagnosed as locally advanced disease in 2020 with evidence of pulmonary metastasis in 2021.  PRIOR THERAPY: 1) status post right nephrectomy on August 27, 2019 in [place]  and the final pathology showed clear-cell renal cell carcinoma nuclear grade 3 with 10% necrosis and negative margin with the final pathologic stage of T2a, NX. 2) status post treatment with Keytruda  and axitinib  started March 2021 for 2 cycles interrupted because of autoimmune hepatitis.  3) axitinib  5 mg daily started July 05, 2020 discontinued after 1 week because of elevated LFTs. 4) status post cryoablation of left renal mass on May 30, 2021. 5) Keytruda  200 Mg IV every 3 weeks started July 27, 2021 and then switch it to 400 Mg every 6 weeks on November 09, 2021.  Last dose was given July 2024. 6) the patient had solitary brain metastasis in November 2024 and currently undergoing treatment with SRS and this will be followed by craniotomy and surgical resection on October 13, 2023.  CURRENT THERAPY: Nivolumab   480 mg IV every 4 weeks in addition to Cabometyx  40 mg p.o daily.  Cabometyx  was discontinued secondary to elevated liver enzyme and intolerance.  He is status post 5 cycles of treatment with nivolumab .  He is here today for evaluation before starting cycle #6.SABRA  INTERVAL HISTORY: Michael [name] 58 y.o. male returns to the clinic today for follow-up visit. Discussed the use of AI scribe software for clinical note transcription with the patient, who gave verbal consent to proceed.  History of Present Illness Michael [name] is a 58 year old male with stage four clear cell renal carcinoma who presents for evaluation before starting cycle six of nivolumab  treatment.  Initially  diagnosed with locally advanced clear cell renal carcinoma in 2020, his condition progressed to stage four with pulmonary metastasis in 2021. He has undergone several treatments and is currently receiving nivolumab  80 mg IV every four weeks. He has completed five cycles and is here for evaluation before starting the sixth cycle. Previously, he was on cabozantinib  40 mg PO daily, which was discontinued due to elevated liver enzymes and intolerance.  He has no issues or complaints with the nivolumab  treatment, with no nausea, vomiting, or rash. He describes his overall experience with the treatment as positive.  He experiences back pain, has a herniated disc, and reports the pain is primarily in the morning and improves throughout the day. He continues to play tennis, noting that activity does not worsen the pain. He uses a heating pad and stretches for symptom relief. A prescribed steroid for inflammation did not provide relief. He had an MRI of the brain, which was normal, and is scheduled to follow up with his neurosurgeon regarding the herniated disc.     MEDICAL HISTORY: Past Medical History:  Diagnosis Date   Cancer [facility])    Renal cell carcinoma   Clear cell renal cell carcinoma (HCC)    Hypertension    Hypothyroidism    Sleep apnea    wears cpap    ALLERGIES:  is allergic to cleocin [clindamycin], penicillins, and tape.  MEDICATIONS:  Current Outpatient Medications  Medication Sig Dispense Refill   amLODipine  (NORVASC ) 5 MG tablet Take 7.5 mg by mouth daily before breakfast.     cabozantinib  (CABOMETYX ) 40 MG tablet Take 1  tablet (40 mg total) by mouth daily. Take on an empty stomach, 1 hour before or 2 hours after meals. (Patient not taking: Reported on 04/27/2024) 30 tablet 3   cyclobenzaprine (FLEXERIL) 10 MG tablet Take 1 tablet po 2x a day prn Orally as directed for 30 days (Patient not taking: Reported on 04/27/2024)     levothyroxine  (SYNTHROID ) 137 MCG tablet TAKE 1 TABLET(137  MCG) BY MOUTH DAILY BEFORE BREAKFAST 30 tablet 2   LORazepam  (ATIVAN ) 0.5 MG tablet 1 tablet p.o. 30 minutes before the MRI scan and may repeat x 1, just prior to scan if needed. (Patient not taking: Reported on 04/27/2024) 10 tablet 0   methocarbamol  (ROBAXIN ) 500 MG tablet Take 500 mg by mouth every 8 (eight) hours as needed for muscle spasms. (Patient not taking: Reported on 04/27/2024)     methylPREDNISolone  (MEDROL  DOSEPAK) 4 MG TBPK tablet Take 6 tablets on day one, then five tablets on day 2, four tablets on day 3, three tablets on day 4 and two tablets on day 5 and one tablet on day 6 21 tablet 1   montelukast  (SINGULAIR ) 10 MG tablet Take 10 mg by mouth daily before breakfast.     nystatin  (MYCOSTATIN ) 100000 UNIT/ML suspension Take by mouth. (Patient not taking: Reported on 04/27/2024)     ondansetron  (ZOFRAN ) 8 MG tablet Take 1 tablet (8 mg total) by mouth every 8 (eight) hours as needed for nausea or vomiting. (Patient not taking: Reported on 04/27/2024) 30 tablet 1   tadalafil (CIALIS) 20 MG tablet Take 10-20 mg by mouth daily as needed for erectile dysfunction.     No current facility-administered medications for this visit.    SURGICAL HISTORY:  Past Surgical History:  Procedure Laterality Date   APPLICATION OF CRANIAL NAVIGATION N/A 10/13/2023   Procedure: APPLICATION OF CRANIAL NAVIGATION;  Surgeon: Lanis Pupa, MD;  Location: [facility] OR;  Service: Neurosurgery;  Laterality: N/A;   CRANIOTOMY N/A 10/13/2023   Procedure: SUBOCCIPITAL   CRANIOTOMY TUMOR EXCISION;  Surgeon: Lanis Pupa, MD;  Location: [facility] OR;  Service: Neurosurgery;  Laterality: N/A;   IR RADIOLOGIST EVAL & MGMT  04/04/2021   no procedures performed   IR RADIOLOGIST EVAL & MGMT  05/08/2021   no procedures performed   IR RADIOLOGIST EVAL & MGMT  08/12/2022   IR RADIOLOGIST EVAL & MGMT  05/24/2024   LUNG BIOPSY  2021   RADIOLOGY WITH ANESTHESIA N/A 05/30/2021   Procedure: CT WITH ANESTHESIA CRYOABLATION;   Surgeon: Luverne Aran, MD;  Location: [facility] ORS;  Service: Radiology;  Laterality: N/A;   right nephrectomy Right 08/2019   TONSILLECTOMY      REVIEW OF SYSTEMS:  A comprehensive review of systems was negative except for: Musculoskeletal: positive for back pain   PHYSICAL EXAMINATION: General appearance: alert, cooperative, appears stated age, and no distress Head: Normocephalic, without obvious abnormality, atraumatic Neck: no adenopathy, no JVD, supple, symmetrical, trachea midline, and thyroid  not enlarged, symmetric, no tenderness/mass/nodules Lymph nodes: Cervical, supraclavicular, and axillary nodes normal. Resp: clear to auscultation bilaterally Back: symmetric, no curvature. ROM normal. No CVA tenderness. Cardio: regular rate and rhythm, S1, S2 normal, no murmur, click, rub or gallop GI: soft, non-tender; bowel sounds normal; no masses,  no organomegaly Extremities: extremities normal, atraumatic, no cyanosis or edema  ECOG PERFORMANCE STATUS: 1 - Symptomatic but completely ambulatory  Blood pressure 113/63, pulse 67, temperature 98 F (36.7 C), resp. rate 17, height 5' 6 (1.676 m), weight 247 lb (112 kg), SpO2 98%.  LABORATORY DATA: Lab Results  Component Value Date   WBC 7.5 07/19/2024   HGB 12.0 (L) 07/19/2024   HCT 37.3 (L) 07/19/2024   MCV 79.0 (L) 07/19/2024   PLT 429 (H) 07/19/2024      Chemistry      Component Value Date/Time   NA 140 06/11/2024 1306   K 3.7 06/11/2024 1306   CL 107 06/11/2024 1306   CO2 27 06/11/2024 1306   BUN 16 06/11/2024 1306   CREATININE 1.22 06/11/2024 1306      Component Value Date/Time   CALCIUM 8.8 (L) 06/11/2024 1306   ALKPHOS 87 06/11/2024 1306   AST 23 06/11/2024 1306   ALT 22 06/11/2024 1306   BILITOT 0.6 06/11/2024 1306       RADIOGRAPHIC STUDIES: MR LUMBAR SPINE WO CONTRAST Result Date: 07/16/2024 CLINICAL DATA:  Lumbar radiculopathy EXAM: MRI LUMBAR SPINE WITHOUT CONTRAST TECHNIQUE: Multiplanar, multisequence MR  imaging of the lumbar spine was performed. No intravenous contrast was administered. COMPARISON:  CT chest abdomen pelvis 04/29/2024, CT PET January 22, 2024 FINDINGS: Bone marrow: There is a hemangioma of the S2 level. There is a large area of low signal that affects the left side of the sacrum and also the left iliac bone. This has low signal on the axial T1 and T2 weighted images, but appears normal on the sagittal images. No abnormality seen on the STIR image. There is no abnormality seen in this location on the CT from 04/29/2024 or the CT PET from January 22, 2024. This is thought most likely an artifact. Conus and cauda equina: No significant abnormality Paraspinal tissues: No significant abnormality L1-L2: Normal L2-L3: Normal L3-L4: The disc is normal.  There is mild facet arthropathy L4-L5: There is mild degenerative disc disease. There is a left paracentral disc herniation compressing the left L5 nerve root. L5-S1: Normal IMPRESSION: The vertebra are numbered such the last well-formed disc is L5-S1. There is a left paracentral disc herniation at L4-5 compressing the left L5 nerve root. Electronically Signed   By: Nancyann Burns M.D.   On: 07/16/2024 11:24   MR Brain W Wo Contrast Result Date: 07/16/2024 CLINICAL DATA:  Follow-up brain metastases EXAM: MRI HEAD WITHOUT AND WITH CONTRAST TECHNIQUE: Multiplanar, multiecho pulse sequences of the brain and surrounding structures were obtained without and with intravenous contrast. CONTRAST:  10mL GADAVIST  GADOBUTROL  1 MMOL/ML IV SOLN COMPARISON:  April 14, 2024 FINDINGS: MRI brain: There are postoperative changes in the right posterior fossa from resection of a cerebellar lesion. There is mild dural enhancement and there is T2 hyperintensity in the inferior cerebellum, which is unchanged. There is a small overlying pseudomeningocele There are no new brain metastases identified. There is no acute or chronic infarct. The ventricles are normal. There are normal flow  signals in the carotid arteries and basilar artery. No significant bone marrow signal abnormality. No significant abnormality in the paranasal sinuses or soft tissues. IMPRESSION: Postoperative changes from resection of metastatic lesion in the right cerebellum. No new lesions identified. Electronically Signed   By: Nancyann Burns M.D.   On: 07/16/2024 11:15    ASSESSMENT AND PLAN: This is a very pleasant 58 years old white male with Stage IV clear-cell renal cell carcinoma initially diagnosed as locally advanced disease in 2020 with evidence of pulmonary metastasis in 2021.  He is status post right nephrectomy on August 27, 2019 in [place]  and the final pathology showed clear-cell renal cell carcinoma nuclear grade 3 with 10% necrosis and negative  margin with the final pathologic stage of T2a, NX. The patient is status post treatment with Keytruda  and axitinib  started March 2021 for 2 cycles interrupted because of autoimmune hepatitis. He then  started Axitinib  5 mg daily on July 05, 2020 discontinued after 1 week because of elevated LFTs. He is also status post cryoablation of left renal mass on May 30, 2021. The patient is currently undergoing treatment with Keytruda  200 Mg IV every 3 weeks started July 27, 2021 and then switch it to 400 Mg every 6 weeks on November 09, 2021.  He completed his treatment in July 2024. He was found to have solitary right cerebellar brain metastasis in November 2024. He started treatment with a combination of nivolumab  480 mg IV every 4 weeks and Cabometyx  40 mg p.o. daily status post 5 cycles.  Cabometyx  was discontinued secondary to intolerance. Assessment and Plan Assessment & Plan Stage IV clear cell renal cell carcinoma with pulmonary metastasis Stage IV clear cell renal cell carcinoma initially diagnosed as locally advanced in 2020 with pulmonary metastasis identified in 2021. Currently undergoing treatment with nivolumab  80 mg IV every four weeks. Previous  treatment with Cabmatics was discontinued due to elevated liver enzymes and intolerance. Tolerating nivolumab  well with no reported side effects such as nausea, vomiting, or rash. - Administer nivolumab  80 mg IV every four weeks. - Order CT scan on July 30, 2024, to evaluate treatment response. - Schedule follow-up appointment in four weeks after the CT scan. The patient was advised to call immediately if he has any concerning symptoms in the interval. The patient voices understanding of current disease status and treatment options and is in agreement with the current care plan.  All questions were answered. The patient knows to call the clinic with any problems, questions or concerns. We can certainly see the patient much sooner if necessary.  The total time spent in the appointment was 20 minutes.  Disclaimer: This note was dictated with voice recognition software. Similar sounding words can inadvertently be transcribed and may not be corrected upon review.

## 2024-07-19 NOTE — Patient Instructions (Signed)
 [facility] - A DEPT OF [facility]  Discharge Instructions: Thank you for choosing [facility] to provide your oncology and hematology care.   If you have a lab appointment with the Cancer Center, please go directly to the Cancer Center and check in at the registration area.   Wear comfortable clothing and clothing appropriate for easy access to any Portacath or PICC line.   We strive to give you quality time with your provider. You may need to reschedule your appointment if you arrive late (15 or more minutes).  Arriving late affects you and other patients whose appointments are after yours.  Also, if you miss three or more appointments without notifying the office, you may be dismissed from the clinic at the provider's discretion.      For prescription refill requests, have your pharmacy contact our office and allow 72 hours for refills to be completed.    Today you received the following chemotherapy and/or immunotherapy agents opdivo      To help prevent nausea and vomiting after your treatment, we encourage you to take your nausea medication as directed.  BELOW ARE SYMPTOMS THAT SHOULD BE REPORTED IMMEDIATELY: *FEVER GREATER THAN 100.4 F (38 C) OR HIGHER *CHILLS OR SWEATING *NAUSEA AND VOMITING THAT IS NOT CONTROLLED WITH YOUR NAUSEA MEDICATION *UNUSUAL SHORTNESS OF BREATH *UNUSUAL BRUISING OR BLEEDING *URINARY PROBLEMS (pain or burning when urinating, or frequent urination) *BOWEL PROBLEMS (unusual diarrhea, constipation, pain near the anus) TENDERNESS IN MOUTH AND THROAT WITH OR WITHOUT PRESENCE OF ULCERS (sore throat, sores in mouth, or a toothache) UNUSUAL RASH, SWELLING OR PAIN  UNUSUAL VAGINAL DISCHARGE OR ITCHING   Items with * indicate a potential emergency and should be followed up as soon as possible or go to the Emergency Department if any problems should occur.  Please show the CHEMOTHERAPY ALERT CARD or IMMUNOTHERAPY  ALERT CARD at check-in to the Emergency Department and triage nurse.  Should you have questions after your visit or need to cancel or reschedule your appointment, please contact [facility] - A DEPT OF [facility]  Dept: [phone]  and follow the prompts.  Office hours are 8:00 a.m. to 4:30 p.m. Monday - Friday. Please note that voicemails left after 4:00 p.m. may not be returned until the following business day.  We are closed weekends and major holidays. You have access to a nurse at all times for urgent questions. Please call the main number to the clinic Dept: [phone] and follow the prompts.   For any non-urgent questions, you may also contact your provider using MyChart. We now offer e-Visits for anyone 74 and older to request care online for non-urgent symptoms. For details visit [email or web address].   Also download the MyChart app! Go to the app store, search "MyChart", open the app, select [facility], and log in with your MyChart username and password.

## 2024-07-20 LAB — T4: T4, Total: 4.9 ug/dL (ref 4.5–12.0)

## 2024-07-20 NOTE — Progress Notes (Signed)
 Radiation Oncology         [phone]) [phone] ________________________________  Name: Michael [name] MRN: [id number]  Date: 07/21/2024  DOB: 08-Dec-1965  Post Treatment Note  CC: [name], MD  [name], MD  Diagnosis:    58 y.o. man with a solitary brain metastasis and osseous metastases at T9 and in the manubrium, secondary to Stage IV clear cell renal cell carcinoma   Interval Since Last Radiation:  9 months s/p SRS Brain; 6 months s/p SBRT to the osseous metastases in the manubrium and T9 vertebral body   01/06/24 - 01/16/24: The osseous metastases in the manubrium and T9 vertebral body were treated to 50 Gy in 5 fractions of 10 Gy each.  10/10/23: The solitary 3 cm right cerebellar brain metastasis was treated to 18 Gy in a single fraction of pre-operative SRS, followed by surgical resection on 10/13/23  Narrative:  I spoke with the patient to conduct his routine scheduled 3 month follow up visit to review results of his post-treatment MRI brain scan from 07/16/24 via telephone to spare the patient unnecessary potential exposure in the healthcare setting during the current COVID-19 pandemic.  The patient was notified in advance and gave permission to proceed with this visit format.  He tolerated the SRS brain treatment well, without any acute side effects.  He also tolerated the recent SBRT treatments to the obvious lesions in the manubrium and T9 vertebral body.  He did notice some mild esophagitis towards the end of treatment and this has lingered but he feels like it is gradually improving.  Really only noticeable when he is eating large bites of foods or meats and breads.  No issues at all with liquids or soft foods.  He has noticed a significant reduction in size of the palpable lesion in the manubrium.  He is starting to regain some sensation in the scalp at his incision site and will occasionally have sharp shooting pains if he lays on it for an extended period of time or presses it really  hard.  Otherwise, he denies frequent or severe headaches, changes in his visual or auditory acuity, focal weakness, nausea/vomiting or difficulties with balance.    His posttreatment MRI brain scan from 07/16/2024 remains without any concerning findings for new intracranial metastasis or residual disease.  There are stable postoperative changes in the right posterior fossa from resection of a cerebellar lesion. There is mild dural enhancement and there is T2 hyperintensity in the inferior cerebellum, which is unchanged and there is a small overlying pseudomeningocele.  There are no new enhancing intracranial lesions, acute infarct, acute hemorrhage or midline shift.  We reviewed these findings by telephone today.  On review of systems, the patient states that ***he is doing very well in general and remains without complaints as noted above.  He denies any unintentional weight loss, chest pain, increased shortness of breath, productive cough or hemoptysis.  He has not had any abdominal pain, nausea, vomiting or diarrhea.  He denies any focal weakness or pain in the skeleton. Overall, he is quite pleased with his progress to date.                          ALLERGIES:  is allergic to cleocin [clindamycin], penicillins, and tape.  Meds: Current Outpatient Medications  Medication Sig Dispense Refill   amLODipine  (NORVASC ) 5 MG tablet Take 7.5 mg by mouth daily before breakfast.     cabozantinib  (CABOMETYX ) 40 MG  tablet Take 1 tablet (40 mg total) by mouth daily. Take on an empty stomach, 1 hour before or 2 hours after meals. (Patient not taking: Reported on 04/27/2024) 30 tablet 3   cyclobenzaprine (FLEXERIL) 10 MG tablet Take 1 tablet po 2x a day prn Orally as directed for 30 days (Patient not taking: Reported on 04/27/2024)     levothyroxine  (SYNTHROID ) 150 MCG tablet Take 1 tablet (150 mcg total) by mouth daily before breakfast. 30 tablet 2   LORazepam  (ATIVAN ) 0.5 MG tablet 1 tablet p.o. 30 minutes before  the MRI scan and may repeat x 1, just prior to scan if needed. (Patient not taking: Reported on 04/27/2024) 10 tablet 0   methocarbamol  (ROBAXIN ) 500 MG tablet Take 500 mg by mouth every 8 (eight) hours as needed for muscle spasms. (Patient not taking: Reported on 04/27/2024)     methylPREDNISolone  (MEDROL  DOSEPAK) 4 MG TBPK tablet Take 6 tablets on day one, then five tablets on day 2, four tablets on day 3, three tablets on day 4 and two tablets on day 5 and one tablet on day 6 (Patient not taking: Reported on 07/19/2024) 21 tablet 1   montelukast  (SINGULAIR ) 10 MG tablet Take 10 mg by mouth daily before breakfast.     nystatin  (MYCOSTATIN ) 100000 UNIT/ML suspension Take by mouth. (Patient not taking: Reported on 04/27/2024)     ondansetron  (ZOFRAN ) 8 MG tablet Take 1 tablet (8 mg total) by mouth every 8 (eight) hours as needed for nausea or vomiting. (Patient not taking: Reported on 04/27/2024) 30 tablet 1   tadalafil (CIALIS) 20 MG tablet Take 10-20 mg by mouth daily as needed for erectile dysfunction.     No current facility-administered medications for this visit.    Physical Findings:  vitals were not taken for this visit.   /10 Unable to assess due to telephone follow-up visit format.  Lab Findings: Lab Results  Component Value Date   WBC 7.5 07/19/2024   HGB 12.0 (L) 07/19/2024   HCT 37.3 (L) 07/19/2024   MCV 79.0 (L) 07/19/2024   PLT 429 (H) 07/19/2024     Radiographic Findings: MR LUMBAR SPINE WO CONTRAST Result Date: 07/16/2024 CLINICAL DATA:  Lumbar radiculopathy EXAM: MRI LUMBAR SPINE WITHOUT CONTRAST TECHNIQUE: Multiplanar, multisequence MR imaging of the lumbar spine was performed. No intravenous contrast was administered. COMPARISON:  CT chest abdomen pelvis 04/29/2024, CT PET January 22, 2024 FINDINGS: Bone marrow: There is a hemangioma of the S2 level. There is a large area of low signal that affects the left side of the sacrum and also the left iliac bone. This has low signal  on the axial T1 and T2 weighted images, but appears normal on the sagittal images. No abnormality seen on the STIR image. There is no abnormality seen in this location on the CT from 04/29/2024 or the CT PET from January 22, 2024. This is thought most likely an artifact. Conus and cauda equina: No significant abnormality Paraspinal tissues: No significant abnormality L1-L2: Normal L2-L3: Normal L3-L4: The disc is normal.  There is mild facet arthropathy L4-L5: There is mild degenerative disc disease. There is a left paracentral disc herniation compressing the left L5 nerve root. L5-S1: Normal IMPRESSION: The vertebra are numbered such the last well-formed disc is L5-S1. There is a left paracentral disc herniation at L4-5 compressing the left L5 nerve root. Electronically Signed   By: Nancyann Burns M.D.   On: 07/16/2024 11:24   MR Brain W Wo Contrast Result  Date: 07/16/2024 CLINICAL DATA:  Follow-up brain metastases EXAM: MRI HEAD WITHOUT AND WITH CONTRAST TECHNIQUE: Multiplanar, multiecho pulse sequences of the brain and surrounding structures were obtained without and with intravenous contrast. CONTRAST:  10mL GADAVIST  GADOBUTROL  1 MMOL/ML IV SOLN COMPARISON:  April 14, 2024 FINDINGS: MRI brain: There are postoperative changes in the right posterior fossa from resection of a cerebellar lesion. There is mild dural enhancement and there is T2 hyperintensity in the inferior cerebellum, which is unchanged. There is a small overlying pseudomeningocele There are no new brain metastases identified. There is no acute or chronic infarct. The ventricles are normal. There are normal flow signals in the carotid arteries and basilar artery. No significant bone marrow signal abnormality. No significant abnormality in the paranasal sinuses or soft tissues. IMPRESSION: Postoperative changes from resection of metastatic lesion in the right cerebellum. No new lesions identified. Electronically Signed   By: Nancyann Burns M.D.   On:  07/16/2024 11:15    Impression/Plan: 1.  58 y.o. man with a solitary brain metastasis and osseous metastases at T9 and in the manubrium, secondary to Stage IV clear cell renal cell carcinoma. He appears to have recovered well from the effects of the preop SRS brain treatment and SBRT of the osseous metastases and is currently without complaints.  We reviewed the findings from his recent posttreatment MRI brain scan, performed on  07/16/2024, which is without any concerning findings for new intracranial metastasis or residual disease.  Therefore, we discussed the plan to proceed with serial MRI brain scans every 3 months to continue to monitor for any evidence of disease progression or recurrence.  I will plan to connect with him by telephone following each scan to review results and recommendations.  He will also continue in routine follow-up under the care and direction of Dr. Gatha for continued management of his systemic disease.  He knows that he is welcome to call at anytime in the interim with any questions or concerns related to his previous radiation treatments.  He appears to have a good understanding of these recommendations and is comfortable and in agreement with the stated plan.  I personally spent 30 minutes in this encounter including chart review, reviewing radiological studies, telephone conversation with the patient, entering orders, coordinating care and completing documentation.    Sabra MICAEL Rusk, PA-C

## 2024-07-21 ENCOUNTER — Ambulatory Visit
Admission: RE | Admit: 2024-07-21 | Discharge: 2024-07-21 | Disposition: A | Discharge status: Home / Self Care | Source: Ambulatory Visit | Attending: Urology | Admitting: Urology

## 2024-07-21 DIAGNOSIS — C649 Malignant neoplasm of unspecified kidney, except renal pelvis: Secondary | ICD-10-CM

## 2024-07-21 DIAGNOSIS — M5416 Radiculopathy, lumbar region: Secondary | ICD-10-CM | POA: Diagnosis not present

## 2024-07-21 DIAGNOSIS — C7951 Secondary malignant neoplasm of bone: Secondary | ICD-10-CM | POA: Diagnosis not present

## 2024-07-21 DIAGNOSIS — C7931 Secondary malignant neoplasm of brain: Secondary | ICD-10-CM | POA: Diagnosis not present

## 2024-07-26 ENCOUNTER — Encounter

## 2024-07-26 ENCOUNTER — Encounter: Payer: Self-pay | Admitting: Internal Medicine

## 2024-07-26 ENCOUNTER — Encounter: Payer: Self-pay | Admitting: Physician Assistant

## 2024-07-27 DIAGNOSIS — G4733 Obstructive sleep apnea (adult) (pediatric): Secondary | ICD-10-CM | POA: Diagnosis not present

## 2024-07-28 ENCOUNTER — Ambulatory Visit: Admitting: Urology

## 2024-07-30 ENCOUNTER — Ambulatory Visit (HOSPITAL_COMMUNITY)
Admission: RE | Admit: 2024-07-30 | Discharge: 2024-07-30 | Disposition: A | Discharge status: Home / Self Care | Source: Ambulatory Visit | Attending: Internal Medicine | Admitting: Internal Medicine

## 2024-07-30 DIAGNOSIS — C641 Malignant neoplasm of right kidney, except renal pelvis: Secondary | ICD-10-CM | POA: Diagnosis not present

## 2024-07-30 DIAGNOSIS — J700 Acute pulmonary manifestations due to radiation: Secondary | ICD-10-CM | POA: Diagnosis not present

## 2024-07-30 DIAGNOSIS — K769 Liver disease, unspecified: Secondary | ICD-10-CM | POA: Diagnosis not present

## 2024-07-30 DIAGNOSIS — C649 Malignant neoplasm of unspecified kidney, except renal pelvis: Secondary | ICD-10-CM | POA: Diagnosis not present

## 2024-07-30 DIAGNOSIS — R918 Other nonspecific abnormal finding of lung field: Secondary | ICD-10-CM | POA: Diagnosis not present

## 2024-08-03 ENCOUNTER — Encounter (HOSPITAL_COMMUNITY): Payer: Self-pay | Admitting: Internal Medicine

## 2024-08-03 ENCOUNTER — Other Ambulatory Visit: Payer: Self-pay

## 2024-08-03 ENCOUNTER — Emergency Department (HOSPITAL_BASED_OUTPATIENT_CLINIC_OR_DEPARTMENT_OTHER): Admitting: Radiology

## 2024-08-03 ENCOUNTER — Telehealth: Payer: Self-pay | Admitting: *Deleted

## 2024-08-03 ENCOUNTER — Inpatient Hospital Stay (HOSPITAL_BASED_OUTPATIENT_CLINIC_OR_DEPARTMENT_OTHER)
Admission: EM | Admit: 2024-08-03 | Discharge: 2024-08-05 | DRG: 193 | Disposition: A | Discharge status: Home / Self Care | Attending: Internal Medicine | Admitting: Internal Medicine

## 2024-08-03 DIAGNOSIS — D630 Anemia in neoplastic disease: Secondary | ICD-10-CM | POA: Diagnosis present

## 2024-08-03 DIAGNOSIS — C7951 Secondary malignant neoplasm of bone: Secondary | ICD-10-CM | POA: Diagnosis present

## 2024-08-03 DIAGNOSIS — Z79890 Hormone replacement therapy (postmenopausal): Secondary | ICD-10-CM

## 2024-08-03 DIAGNOSIS — C7931 Secondary malignant neoplasm of brain: Secondary | ICD-10-CM | POA: Diagnosis not present

## 2024-08-03 DIAGNOSIS — Z881 Allergy status to other antibiotic agents status: Secondary | ICD-10-CM | POA: Diagnosis not present

## 2024-08-03 DIAGNOSIS — R319 Hematuria, unspecified: Secondary | ICD-10-CM | POA: Diagnosis not present

## 2024-08-03 DIAGNOSIS — N182 Chronic kidney disease, stage 2 (mild): Secondary | ICD-10-CM | POA: Diagnosis not present

## 2024-08-03 DIAGNOSIS — Z880 Allergy status to penicillin: Secondary | ICD-10-CM | POA: Diagnosis not present

## 2024-08-03 DIAGNOSIS — I129 Hypertensive chronic kidney disease with stage 1 through stage 4 chronic kidney disease, or unspecified chronic kidney disease: Secondary | ICD-10-CM | POA: Diagnosis not present

## 2024-08-03 DIAGNOSIS — C641 Malignant neoplasm of right kidney, except renal pelvis: Secondary | ICD-10-CM | POA: Diagnosis not present

## 2024-08-03 DIAGNOSIS — E66812 Obesity, class 2: Secondary | ICD-10-CM | POA: Diagnosis not present

## 2024-08-03 DIAGNOSIS — Y842 Radiological procedure and radiotherapy as the cause of abnormal reaction of the patient, or of later complication, without mention of misadventure at the time of the procedure: Secondary | ICD-10-CM | POA: Diagnosis not present

## 2024-08-03 DIAGNOSIS — K429 Umbilical hernia without obstruction or gangrene: Secondary | ICD-10-CM | POA: Diagnosis not present

## 2024-08-03 DIAGNOSIS — Z905 Acquired absence of kidney: Secondary | ICD-10-CM

## 2024-08-03 DIAGNOSIS — Z1152 Encounter for screening for COVID-19: Secondary | ICD-10-CM

## 2024-08-03 DIAGNOSIS — N135 Crossing vessel and stricture of ureter without hydronephrosis: Secondary | ICD-10-CM | POA: Diagnosis not present

## 2024-08-03 DIAGNOSIS — C7901 Secondary malignant neoplasm of right kidney and renal pelvis: Secondary | ICD-10-CM | POA: Diagnosis not present

## 2024-08-03 DIAGNOSIS — Z923 Personal history of irradiation: Secondary | ICD-10-CM | POA: Diagnosis not present

## 2024-08-03 DIAGNOSIS — C801 Malignant (primary) neoplasm, unspecified: Secondary | ICD-10-CM | POA: Diagnosis not present

## 2024-08-03 DIAGNOSIS — R918 Other nonspecific abnormal finding of lung field: Secondary | ICD-10-CM | POA: Diagnosis not present

## 2024-08-03 DIAGNOSIS — E039 Hypothyroidism, unspecified: Secondary | ICD-10-CM | POA: Diagnosis present

## 2024-08-03 DIAGNOSIS — J189 Pneumonia, unspecified organism: Secondary | ICD-10-CM | POA: Diagnosis not present

## 2024-08-03 DIAGNOSIS — C7800 Secondary malignant neoplasm of unspecified lung: Secondary | ICD-10-CM | POA: Diagnosis not present

## 2024-08-03 DIAGNOSIS — J700 Acute pulmonary manifestations due to radiation: Secondary | ICD-10-CM | POA: Diagnosis not present

## 2024-08-03 DIAGNOSIS — Z79899 Other long term (current) drug therapy: Secondary | ICD-10-CM | POA: Diagnosis not present

## 2024-08-03 DIAGNOSIS — Z85528 Personal history of other malignant neoplasm of kidney: Secondary | ICD-10-CM | POA: Diagnosis not present

## 2024-08-03 DIAGNOSIS — J440 Chronic obstructive pulmonary disease with acute lower respiratory infection: Secondary | ICD-10-CM | POA: Diagnosis present

## 2024-08-03 DIAGNOSIS — Z91048 Other nonmedicinal substance allergy status: Secondary | ICD-10-CM

## 2024-08-03 DIAGNOSIS — Z9221 Personal history of antineoplastic chemotherapy: Secondary | ICD-10-CM

## 2024-08-03 DIAGNOSIS — K7689 Other specified diseases of liver: Secondary | ICD-10-CM | POA: Diagnosis not present

## 2024-08-03 DIAGNOSIS — R059 Cough, unspecified: Secondary | ICD-10-CM | POA: Diagnosis not present

## 2024-08-03 DIAGNOSIS — Z6839 Body mass index (BMI) 39.0-39.9, adult: Secondary | ICD-10-CM | POA: Diagnosis not present

## 2024-08-03 DIAGNOSIS — R0789 Other chest pain: Secondary | ICD-10-CM | POA: Diagnosis not present

## 2024-08-03 DIAGNOSIS — R0600 Dyspnea, unspecified: Secondary | ICD-10-CM | POA: Diagnosis not present

## 2024-08-03 DIAGNOSIS — R310 Gross hematuria: Secondary | ICD-10-CM | POA: Diagnosis not present

## 2024-08-03 DIAGNOSIS — N2882 Megaloureter: Secondary | ICD-10-CM | POA: Diagnosis not present

## 2024-08-03 DIAGNOSIS — J9601 Acute respiratory failure with hypoxia: Secondary | ICD-10-CM | POA: Diagnosis not present

## 2024-08-03 LAB — COMPREHENSIVE METABOLIC PANEL WITH GFR
ALT: 46 U/L — ABNORMAL HIGH (ref 0–44)
AST: 37 U/L (ref 15–41)
Albumin: 3.6 g/dL (ref 3.5–5.0)
Alkaline Phosphatase: 144 U/L — ABNORMAL HIGH (ref 38–126)
Anion gap: 13 (ref 5–15)
BUN: 9 mg/dL (ref 6–20)
CO2: 22 mmol/L (ref 22–32)
Calcium: 9.3 mg/dL (ref 8.9–10.3)
Chloride: 103 mmol/L (ref 98–111)
Creatinine, Ser: 1.25 mg/dL — ABNORMAL HIGH (ref 0.61–1.24)
GFR, Estimated: >60 mL/min (ref >60)
Glucose, Bld: 107 mg/dL — ABNORMAL HIGH (ref 70–99)
Potassium: 4.6 mmol/L (ref 3.5–5.1)
Sodium: 138 mmol/L (ref 135–145)
Total Bilirubin: 0.5 mg/dL (ref 0.0–1.2)
Total Protein: 7.4 g/dL (ref 6.5–8.1)

## 2024-08-03 LAB — RESP PANEL BY RT-PCR (RSV, FLU A&B, COVID)  RVPGX2
Influenza A by PCR: NEGATIVE
Influenza B by PCR: NEGATIVE
Resp Syncytial Virus by PCR: NEGATIVE
SARS Coronavirus 2 by RT PCR: NEGATIVE

## 2024-08-03 LAB — CBC WITH DIFFERENTIAL/PLATELET
Abs Immature Granulocytes: 0.05 K/uL (ref 0.00–0.07)
Basophils Absolute: 0 K/uL (ref 0.0–0.1)
Basophils Relative: 0 %
Eosinophils Absolute: 0.4 K/uL (ref 0.0–0.5)
Eosinophils Relative: 3 %
HCT: 37.3 % — ABNORMAL LOW (ref 39.0–52.0)
Hemoglobin: 11.8 g/dL — ABNORMAL LOW (ref 13.0–17.0)
Immature Granulocytes: 1 %
Lymphocytes Relative: 9 %
Lymphs Abs: 0.9 K/uL (ref 0.7–4.0)
MCH: 25.4 pg — ABNORMAL LOW (ref 26.0–34.0)
MCHC: 31.6 g/dL (ref 30.0–36.0)
MCV: 80.2 fL (ref 80.0–100.0)
Monocytes Absolute: 0.8 K/uL (ref 0.1–1.0)
Monocytes Relative: 7 %
Neutro Abs: 8.7 K/uL — ABNORMAL HIGH (ref 1.7–7.7)
Neutrophils Relative %: 80 %
Platelets: 342 K/uL (ref 150–400)
RBC: 4.65 MIL/uL (ref 4.22–5.81)
RDW: 15.7 % — ABNORMAL HIGH (ref 11.5–15.5)
WBC: 10.9 K/uL — ABNORMAL HIGH (ref 4.0–10.5)
nRBC: 0 % (ref 0.0–0.2)

## 2024-08-03 LAB — LACTIC ACID, PLASMA: Lactic Acid, Venous: 1.1 mmol/L (ref 0.5–1.9)

## 2024-08-03 MED ORDER — FLUTICASONE-SALMETEROL 113-14 MCG/ACT IN AEPB: 1.0000 | INHALATION_SPRAY | Freq: Two times a day (BID) | RESPIRATORY_TRACT | Status: DC

## 2024-08-03 MED ORDER — SODIUM CHLORIDE 0.9 % IV SOLN
1.0000 g | INTRAVENOUS | Status: DC
  Administered 2024-08-04 – 2024-08-05 (×2): 1 g via INTRAVENOUS
  Filled 2024-08-03 (×2): qty 10

## 2024-08-03 MED ORDER — SODIUM CHLORIDE 0.9% FLUSH
3.0000 mL | Freq: Two times a day (BID) | INTRAVENOUS | Status: DC
  Administered 2024-08-04 – 2024-08-05 (×4): 3 mL via INTRAVENOUS

## 2024-08-03 MED ORDER — POLYETHYLENE GLYCOL 3350 17 G PO PACK: 17.0000 g | PACK | Freq: Every day | ORAL | Status: DC | PRN

## 2024-08-03 MED ORDER — AZITHROMYCIN 500 MG IV SOLR
500.0000 mg | Freq: Once | INTRAVENOUS | Status: AC
  Administered 2024-08-03: 500 mg via INTRAVENOUS
  Filled 2024-08-03: qty 5

## 2024-08-03 MED ORDER — AZITHROMYCIN 250 MG PO TABS
500.0000 mg | ORAL_TABLET | Freq: Every day | ORAL | Status: AC
  Administered 2024-08-04 – 2024-08-05 (×2): 500 mg via ORAL
  Filled 2024-08-03 (×2): qty 2

## 2024-08-03 MED ORDER — GUAIFENESIN ER 600 MG PO TB12
600.0000 mg | ORAL_TABLET | Freq: Two times a day (BID) | ORAL | Status: DC
  Filled 2024-08-03 (×2): qty 1

## 2024-08-03 MED ORDER — AMLODIPINE BESYLATE 5 MG PO TABS
7.5000 mg | ORAL_TABLET | Freq: Every day | ORAL | Status: DC
  Administered 2024-08-04 – 2024-08-05 (×2): 7.5 mg via ORAL
  Filled 2024-08-03 (×2): qty 2

## 2024-08-03 MED ORDER — ACETAMINOPHEN 500 MG PO TABS: 1000.0000 mg | ORAL_TABLET | Freq: Four times a day (QID) | ORAL | Status: DC | PRN

## 2024-08-03 MED ORDER — IPRATROPIUM-ALBUTEROL 0.5-2.5 (3) MG/3ML IN SOLN
3.0000 mL | Freq: Four times a day (QID) | RESPIRATORY_TRACT | Status: DC
  Administered 2024-08-04 (×3): 3 mL via RESPIRATORY_TRACT
  Filled 2024-08-03 (×3): qty 3

## 2024-08-03 MED ORDER — MONTELUKAST SODIUM 10 MG PO TABS
10.0000 mg | ORAL_TABLET | Freq: Every day | ORAL | Status: DC
  Administered 2024-08-04 – 2024-08-05 (×2): 10 mg via ORAL
  Filled 2024-08-03 (×2): qty 1

## 2024-08-03 MED ORDER — METHYLPREDNISOLONE SODIUM SUCC 125 MG IJ SOLR
125.0000 mg | Freq: Once | INTRAMUSCULAR | Status: AC
Start: 2024-08-03 — End: 2024-08-03
  Administered 2024-08-03: 125 mg via INTRAVENOUS
  Filled 2024-08-03: qty 2

## 2024-08-03 MED ORDER — FLUTICASONE FUROATE-VILANTEROL 100-25 MCG/ACT IN AEPB
1.0000 | INHALATION_SPRAY | Freq: Every day | RESPIRATORY_TRACT | Status: DC
  Filled 2024-08-03: qty 28

## 2024-08-03 MED ORDER — ENOXAPARIN SODIUM 60 MG/0.6ML IJ SOSY
60.0000 mg | PREFILLED_SYRINGE | INTRAMUSCULAR | Status: DC
  Filled 2024-08-03 (×2): qty 0.6

## 2024-08-03 MED ORDER — ALBUTEROL SULFATE (2.5 MG/3ML) 0.083% IN NEBU: 2.5000 mg | INHALATION_SOLUTION | RESPIRATORY_TRACT | Status: DC | PRN

## 2024-08-03 MED ORDER — ACETAMINOPHEN 500 MG PO TABS
1000.0000 mg | ORAL_TABLET | Freq: Once | ORAL | Status: AC
Start: 2024-08-03 — End: 2024-08-03
  Administered 2024-08-03: 1000 mg via ORAL
  Filled 2024-08-03: qty 2

## 2024-08-03 MED ORDER — SODIUM CHLORIDE 0.9 % IV SOLN
1.0000 g | Freq: Once | INTRAVENOUS | Status: AC
  Administered 2024-08-03: 1 g via INTRAVENOUS
  Filled 2024-08-03: qty 10

## 2024-08-03 MED ORDER — SODIUM CHLORIDE 0.9 % IV SOLN: INTRAVENOUS | Status: AC | PRN

## 2024-08-03 MED ORDER — ONDANSETRON HCL 4 MG/2ML IJ SOLN: 4.0000 mg | Freq: Four times a day (QID) | INTRAMUSCULAR | Status: DC | PRN

## 2024-08-03 MED ORDER — LEVOTHYROXINE SODIUM 75 MCG PO TABS
150.0000 ug | ORAL_TABLET | Freq: Every day | ORAL | Status: DC
  Administered 2024-08-04 – 2024-08-05 (×2): 150 ug via ORAL
  Filled 2024-08-03 (×2): qty 2

## 2024-08-03 MED ORDER — MELATONIN 3 MG PO TABS: 6.0000 mg | ORAL_TABLET | Freq: Every evening | ORAL | Status: DC | PRN

## 2024-08-03 NOTE — H&P (Signed)
 History and Physical    Michael [name] FMW:[id number] DOB: 12-11-1965 DOA: 08/03/2024  PCP: [name], MD   Patient coming from: Tx from [facility] ED    Chief Complaint:  Chief Complaint  Patient presents with   Shortness of Breath    HPI:  Michael [name] is a 58 y.o. male with hx of renal cell carcinoma with brain, pulmonary, bone metastasis s/p chemo/radiation, resection cerebellar met, COPD, HTN, CKD2, hypothyroidism, who presents with SOB / cough, and hypoxia on home pulse oximeter ***   Review of Systems:  ROS complete and negative except as marked above   Allergies  Allergen Reactions   Cleocin [Clindamycin] Rash   Penicillins Other (See Comments)    Unknown reaction in childhood   Tape Rash    Prior to Admission medications   Medication Sig Start Date End Date Taking? Authorizing Provider  Fluticasone-Salmeterol 113-14 MCG/ACT AEPB Inhale 1 puff into the lungs 2 (two) times daily. 07/30/24  Yes [provider]  amLODipine  (NORVASC ) 5 MG tablet Take 7.5 mg by mouth daily before breakfast. 04/23/21   [provider]  cabozantinib  (CABOMETYX ) 40 MG tablet Take 1 tablet (40 mg total) by mouth daily. Take on an empty stomach, 1 hour before or 2 hours after meals. Patient not taking: Reported on 04/27/2024 01/14/24   [name], MD  levothyroxine  (SYNTHROID ) 150 MCG tablet Take 1 tablet (150 mcg total) by mouth daily before breakfast. 07/19/24   [name], MD  LORazepam  (ATIVAN ) 0.5 MG tablet 1 tablet p.o. 30 minutes before the MRI scan and may repeat x 1, just prior to scan if needed. Patient not taking: Reported on 04/27/2024 04/13/24   Bruning, Ashlyn, PA-C  methylPREDNISolone  (MEDROL  DOSEPAK) 4 MG TBPK tablet Take 6 tablets on day one, then five tablets on day 2, four tablets on day 3, three tablets on day 4 and two tablets on day 5 and one tablet on day 6 Patient not taking: Reported on 07/19/2024 05/22/24   Bruning, Ashlyn, PA-C  montelukast   (SINGULAIR ) 10 MG tablet Take 10 mg by mouth daily before breakfast.    [provider]  ondansetron  (ZOFRAN ) 8 MG tablet Take 1 tablet (8 mg total) by mouth every 8 (eight) hours as needed for nausea or vomiting. Patient not taking: Reported on 04/27/2024 12/15/23   [name], MD  tadalafil (CIALIS) 20 MG tablet Take 10-20 mg by mouth daily as needed for erectile dysfunction.    [provider]    Past Medical History:  Diagnosis Date   Cancer (HCC)    Renal cell carcinoma   Clear cell renal cell carcinoma (HCC)    Hypertension    Hypothyroidism    Sleep apnea    wears cpap    Past Surgical History:  Procedure Laterality Date   APPLICATION OF CRANIAL NAVIGATION N/A 10/13/2023   Procedure: APPLICATION OF CRANIAL NAVIGATION;  Surgeon: Lanis Pupa, MD;  Location: [facility] OR;  Service: Neurosurgery;  Laterality: N/A;   CRANIOTOMY N/A 10/13/2023   Procedure: SUBOCCIPITAL   CRANIOTOMY TUMOR EXCISION;  Surgeon: Lanis Pupa, MD;  Location: [facility] OR;  Service: Neurosurgery;  Laterality: N/A;   IR RADIOLOGIST EVAL & MGMT  04/04/2021   no procedures performed   IR RADIOLOGIST EVAL & MGMT  05/08/2021   no procedures performed   IR RADIOLOGIST EVAL & MGMT  08/12/2022   IR RADIOLOGIST EVAL & MGMT  05/24/2024   LUNG BIOPSY  2021   RADIOLOGY WITH ANESTHESIA N/A 05/30/2021  Procedure: CT WITH ANESTHESIA CRYOABLATION;  Surgeon: Luverne Aran, MD;  Location: [facility] ORS;  Service: Radiology;  Laterality: N/A;   right nephrectomy Right 08/2019   TONSILLECTOMY       reports that he has never smoked. He has never used smokeless tobacco. He reports that he does not currently use alcohol . He reports that he does not use drugs.  Family History  Problem Relation Age of Onset   Liver disease Neg Hx    Colon cancer Neg Hx    Esophageal cancer Neg Hx      Physical Exam: Vitals:   08/03/24 2000 08/03/24 2100 08/03/24 2155 08/03/24 2202  BP: 117/86 116/80 (!) 135/90   Pulse:  66 60 64   Resp: 16 20 14    Temp:   97.6 F (36.4 C)   TempSrc:   Oral   SpO2: 94% 94% 94%   Weight:    109.6 kg  Height:    5' 6 (1.676 m)    Gen: Awake, alert, NAD ***  CV: Regular, normal S1, S2, no murmurs  Resp: Normal WOB, CTAB  Abd: Flat, normoactive, nontender MSK: Symmetric, no edema  Skin: No rashes or lesions to exposed skin  Neuro: Alert and interactive  Psych: euthymic, appropriate    Data review:   Labs reviewed, notable for:   Chemistries unremarkable  WBC 10  Lactate 1.1   Micro:  Results for orders placed or performed during the hospital encounter of 08/03/24  Resp panel by RT-PCR (RSV, Flu A&B, Covid) Anterior Nasal Swab     Status: None   Collection Time: 08/03/24 10:28 AM   Specimen: Anterior Nasal Swab  Result Value Ref Range Status   SARS Coronavirus 2 by RT PCR NEGATIVE NEGATIVE Final    Comment: (NOTE) SARS-CoV-2 target nucleic acids are NOT DETECTED.  The SARS-CoV-2 RNA is generally detectable in upper respiratory specimens during the acute phase of infection. The lowest concentration of SARS-CoV-2 viral copies this assay can detect is 138 copies/mL. A negative result does not preclude SARS-Cov-2 infection and should not be used as the sole basis for treatment or other patient management decisions. A negative result may occur with  improper specimen collection/handling, submission of specimen other than nasopharyngeal swab, presence of viral mutation(s) within the areas targeted by this assay, and inadequate number of viral copies(<138 copies/mL). A negative result must be combined with clinical observations, patient history, and epidemiological information. The expected result is Negative.  Fact Sheet for Patients:  BloggerCourse.com  Fact Sheet for Healthcare Providers:  SeriousBroker.it  This test is no t yet approved or cleared by the [place]  FDA and  has been authorized for  detection and/or diagnosis of SARS-CoV-2 by FDA under an Emergency Use Authorization (EUA). This EUA will remain  in effect (meaning this test can be used) for the duration of the COVID-19 declaration under Section 564(b)(1) of the Act, 21 U.S.C.section 360bbb-3(b)(1), unless the authorization is terminated  or revoked sooner.       Influenza A by PCR NEGATIVE NEGATIVE Final   Influenza B by PCR NEGATIVE NEGATIVE Final    Comment: (NOTE) The Xpert Xpress SARS-CoV-2/FLU/RSV plus assay is intended as an aid in the diagnosis of influenza from Nasopharyngeal swab specimens and should not be used as a sole basis for treatment. Nasal washings and aspirates are unacceptable for Xpert Xpress SARS-CoV-2/FLU/RSV testing.  Fact Sheet for Patients: BloggerCourse.com  Fact Sheet for Healthcare Providers: SeriousBroker.it  This test is not yet approved or  cleared by the [place]  FDA and has been authorized for detection and/or diagnosis of SARS-CoV-2 by FDA under an Emergency Use Authorization (EUA). This EUA will remain in effect (meaning this test can be used) for the duration of the COVID-19 declaration under Section 564(b)(1) of the Act, 21 U.S.C. section 360bbb-3(b)(1), unless the authorization is terminated or revoked.     Resp Syncytial Virus by PCR NEGATIVE NEGATIVE Final    Comment: (NOTE) Fact Sheet for Patients: BloggerCourse.com  Fact Sheet for Healthcare Providers: SeriousBroker.it  This test is not yet approved or cleared by the [place]  FDA and has been authorized for detection and/or diagnosis of SARS-CoV-2 by FDA under an Emergency Use Authorization (EUA). This EUA will remain in effect (meaning this test can be used) for the duration of the COVID-19 declaration under Section 564(b)(1) of the Act, 21 U.S.C. section 360bbb-3(b)(1), unless the authorization is  terminated or revoked.  Performed at [facility], [address], [place], [place] [address]     Imaging reviewed:  DG Chest 2 View Result Date: 08/03/2024 CLINICAL DATA:  Cough, dyspnea.  History of renal cell carcinoma. EXAM: CHEST - 2 VIEW COMPARISON:  July 13, 2020.  July 30, 2024. FINDINGS: Stable cardiomediastinal silhouette. Bilateral lower lobe opacities are noted concerning for pneumonia or atelectasis. Continued right upper lobe opacity is noted concerning for pneumonia or radiation pneumonitis as noted on prior CT scan. Bony thorax is unremarkable. IMPRESSION: Bilateral lower lobe opacities are noted concerning for pneumonia or atelectasis. Continued right upper lobe opacity is noted concerning for pneumonia or radiation pneumonitis as noted on prior CT scan. Electronically Signed   By: Lynwood Landy Raddle M.D.   On: 08/03/2024 11:18    EKG:  Personally reviewed, SR, slight ST depression inferiorly    ED Course:  Desat to 86% requiring 2L O2. Treated with Ceftriaxone , azithromycin, solumedrol, tylenol . Transferred to [facility] for further care.    Assessment/Plan:  58 y.o. male with hx renal cell carcinoma with brain/pulmonary metastasis s/p chemo/radiation, COPD, HTN, CKD2, hypothyroidism, who presents with SOB / cough, and hypoxia on home pulse oximeter. Transferred from [facility] ED to [facility] for AHRF associated with community acquired pneumonia   Community acquired pneumonia  COPD ***  AHRF, on 2L O2   *** Desat to 86% on RA, currently requiring 2L O2. Other VS reassuring. WBC 10, lactate wnl. Flu / COVID / RSV neg. CXR with bibasilar opacities, suspect CAP in this setting; chronic RUL opacity ? Radiation pneumonitis.  -Ceftriaxone  1 g IV daily, azithromycin 500 mg daily  -Continue prednisone  40 mg daily for 5 days ***  -Check RVP, sputum culture - albuterol scheduled during day, incentive spirometer, encourage out of bed to chair. -Home O2 evaluation prior to  discharge -Smoking cessation***   Chronic medical problems:  Metastatic RCC, mets to brain, lung, bone: s/p chemoradiation, SRS brain, resection of cerebellar met. Followed by oncology Dr. Sherrod, currently on immunotherapy with Nivolumab .  HTN: Continue home Amlodipine   CKD 2: Baseline Cr ~ 1.1-1.2  Hypothyroidism: Continue home levothyroxine    Body mass index is 39 kg/m. Obesity class II, would benefit from weight loss OP    DVT prophylaxis:  {Blank single:19197::Lovenox,SQ Heparin,IV heparin gtts,Xarelto,Eliquis,Coumadin,SCDs,***} Code Status:  {Blank single:19197::Full Code,DNR with Intubation,DNR/DNI(Do NOT Intubate),Comfort Care,***} Diet:  Diet Orders (From admission, onward)    None      Family Communication:  ***  Consults:  ***  Admission status:   {Blank single:19197::Observation,Inpatient}, {Blank single:19197::Med-Surg,Telemetry bed,Step Down Unit}  Severity of Illness: {Observation/Inpatient:21159}   Dorn Dawson, MD [facility]  How to contact the [facility] Attending or Consulting provider 7A - 7P or covering provider during after hours 7P -7A, for this patient.  Check the care team in [facility] and look for a) attending/consulting [facility] provider listed and b) the [facility] team listed Log into [email or web address] and use [place]'s universal password to access. If you do not have the password, please contact the hospital operator. Locate the [facility] provider you are looking for under [facility] and page to a number that you can be directly reached. If you still have difficulty reaching the provider, please page the [facility] (Director on Call) for the Hospitalists listed on amion for assistance.  08/03/2024, 10:55 PM

## 2024-08-03 NOTE — Telephone Encounter (Signed)
 Ms. Imran called with following message at 0915: He's been short of breath all weekend with O2 sats average 88%. She is taking him to [facility] ED at this time.  CT CAP 07/30/24. Dr. Onita, his family doctor reviewed the results with him because he had an appt with him that date. Dr. Onita was going to send Dr. Sherrod a message about it.  She asked what to do about ureter?  Message routed to Dr. Sherrod.  Dr. Sherrod informed that patient in route to ED

## 2024-08-03 NOTE — Progress Notes (Signed)
 Plan of Care Note for accepted transfer   Patient: Michael [name] MRN: [id number]   DOA: 08/03/2024  Facility requesting transfer: [facility] Requesting Provider: Glendia Breeding, MD Reason for transfer: Acute respiratory failure secondary to pneumonia versus pneumonitis Facility course:  Suhayb Anzalone is a 58 year old male with past medical history significant for renal cell carcinoma with brain/pulmonary metastasis s/p radiation chemo/radiation followed by oncology Dr. Sherrod, HTN, hypothyroidism, COPD who presented with cough, hypoxia noted on home pulse oximeter.  Workup in the ED notable for oxygen  saturation of 86% on room air, placed on 2 L nasal cannula.  Temperature 98.7 F, WBC count 10.9.  Chest x-ray with bilateral lower lobe opacities consistent with pneumonia versus pneumonitis.  Patient received IV azithromycin and ceftriaxone , IV Solu-Medrol .  EDP discussed with patient's oncologist, Dr. Gatha.  Plan of care: The patient is accepted for admission to Med-surg  unit, at [facility]..    Author: Camellia PARAS [place], DO 08/03/2024  Check [email or web address] for on-call coverage.  Nursing staff, Please call [facility] Admits & Consults System-Wide number on Amion as soon as patient's arrival, so appropriate admitting provider can evaluate the pt.

## 2024-08-03 NOTE — ED Triage Notes (Signed)
 Patient states hypoxia today at home. History of renal cell carcinoma. Recent radiation. O2 saturation 86% on room air.

## 2024-08-03 NOTE — ED Notes (Signed)
 Purple man green.

## 2024-08-03 NOTE — ED Provider Notes (Signed)
 [place] EMERGENCY DEPARTMENT AT [facility] Provider Note   CSN: [id number] Arrival date & time: 08/03/24  9076     Patient presents with: Shortness of Breath   Michael [name] is a 58 y.o. male.   HPI 58 year old male with a history of renal cell carcinoma with metastases who is currently on immunotherapy presents with shortness of breath and hypoxia.  History is from patient and wife.  Patient's been dealing with some dyspnea, at rest but primarily on exertion for the last 10 days.  Has had a mostly nonproductive cough as well.  No fevers or chest pain.  However his wife started checking his O2 sats yesterday and today and they were borderline low and then today was 87% on room air.  Does not normally wear oxygen .  Has some chronic but unchanged edema that is associated with his Norvasc  for him.  Otherwise, he had a CT done on 9/26 which showed probable radiation pneumonitis.  He was prescribed an inhaler but the pharmacy did not have it ready yet.  Called the oncology office but came here for evaluation of the hypoxia. He was 86% on room air here, now is on 2L.  Prior to Admission medications   Medication Sig Start Date End Date Taking? Authorizing Provider  Fluticasone-Salmeterol 113-14 MCG/ACT AEPB Inhale 1 puff into the lungs 2 (two) times daily. 07/30/24  Yes [provider]  amLODipine  (NORVASC ) 5 MG tablet Take 7.5 mg by mouth daily before breakfast. 04/23/21   [provider]  cabozantinib  (CABOMETYX ) 40 MG tablet Take 1 tablet (40 mg total) by mouth daily. Take on an empty stomach, 1 hour before or 2 hours after meals. Patient not taking: Reported on 04/27/2024 01/14/24   [name], MD  levothyroxine  (SYNTHROID ) 150 MCG tablet Take 1 tablet (150 mcg total) by mouth daily before breakfast. 07/19/24   [name], MD  LORazepam  (ATIVAN ) 0.5 MG tablet 1 tablet p.o. 30 minutes before the MRI scan and may repeat x 1, just prior to scan if  needed. Patient not taking: Reported on 04/27/2024 04/13/24   Bruning, Ashlyn, PA-C  methylPREDNISolone  (MEDROL  DOSEPAK) 4 MG TBPK tablet Take 6 tablets on day one, then five tablets on day 2, four tablets on day 3, three tablets on day 4 and two tablets on day 5 and one tablet on day 6 Patient not taking: Reported on 07/19/2024 05/22/24   Bruning, Ashlyn, PA-C  montelukast  (SINGULAIR ) 10 MG tablet Take 10 mg by mouth daily before breakfast.    [provider]  ondansetron  (ZOFRAN ) 8 MG tablet Take 1 tablet (8 mg total) by mouth every 8 (eight) hours as needed for nausea or vomiting. Patient not taking: Reported on 04/27/2024 12/15/23   [name], MD  tadalafil (CIALIS) 20 MG tablet Take 10-20 mg by mouth daily as needed for erectile dysfunction.    [provider]    Allergies: Cleocin [clindamycin], Penicillins, and Tape    Review of Systems  Constitutional:  Negative for fever.  Respiratory:  Positive for cough and shortness of breath.   Cardiovascular:  Positive for leg swelling (chronic).    Updated Vital Signs BP 124/81 (BP Location: Left Arm)   Pulse 80   Temp 98.8 F (37.1 C) (Oral)   Resp 19   SpO2 94%   Physical Exam Vitals and nursing note reviewed.  Constitutional:      General: He is not in acute distress.    Appearance: He is well-developed. He is  not ill-appearing or diaphoretic.  HENT:     Head: Normocephalic and atraumatic.  Cardiovascular:     Rate and Rhythm: Normal rate and regular rhythm.     Heart sounds: Normal heart sounds.  Pulmonary:     Effort: Pulmonary effort is normal. No tachypnea, accessory muscle usage or respiratory distress.     Breath sounds: Examination of the right-lower field reveals rales. Examination of the left-lower field reveals rales. Rales present.  Abdominal:     Palpations: Abdomen is soft.     Tenderness: There is no abdominal tenderness.  Skin:    General: Skin is warm and dry.  Neurological:     Mental  Status: He is alert.     (all labs ordered are listed, but only abnormal results are displayed) Labs Reviewed  COMPREHENSIVE METABOLIC PANEL WITH GFR - Abnormal; Notable for the following components:      Result Value   Glucose, Bld 107 (*)    Creatinine, Ser 1.25 (*)    ALT 46 (*)    Alkaline Phosphatase 144 (*)    All other components within normal limits  CBC WITH DIFFERENTIAL/PLATELET - Abnormal; Notable for the following components:   WBC 10.9 (*)    Hemoglobin 11.8 (*)    HCT 37.3 (*)    MCH 25.4 (*)    RDW 15.7 (*)    Neutro Abs 8.7 (*)    All other components within normal limits  RESP PANEL BY RT-PCR (RSV, FLU A&B, COVID)  [id number]  CULTURE, BLOOD (ROUTINE X 2)  CULTURE, BLOOD (ROUTINE X 2)  LACTIC ACID, PLASMA    EKG: None  Radiology: DG Chest 2 View Result Date: 08/03/2024 CLINICAL DATA:  Cough, dyspnea.  History of renal cell carcinoma. EXAM: CHEST - 2 VIEW COMPARISON:  July 13, 2020.  July 30, 2024. FINDINGS: Stable cardiomediastinal silhouette. Bilateral lower lobe opacities are noted concerning for pneumonia or atelectasis. Continued right upper lobe opacity is noted concerning for pneumonia or radiation pneumonitis as noted on prior CT scan. Bony thorax is unremarkable. IMPRESSION: Bilateral lower lobe opacities are noted concerning for pneumonia or atelectasis. Continued right upper lobe opacity is noted concerning for pneumonia or radiation pneumonitis as noted on prior CT scan. Electronically Signed   By: Lynwood Landy Raddle M.D.   On: 08/03/2024 11:18     Procedures   Medications Ordered in the ED  0.9 %  sodium chloride  infusion (0 mLs Intravenous Stopped 08/03/24 1308)  methylPREDNISolone  sodium succinate (SOLU-MEDROL ) 125 mg/2 mL injection 125 mg (125 mg Intravenous Given 08/03/24 1222)  cefTRIAXone  (ROCEPHIN ) 1 g in sodium chloride  0.9 % 100 mL IVPB (0 g Intravenous Stopped 08/03/24 1300)  azithromycin (ZITHROMAX) 500 mg in sodium chloride  0.9 % 250  mL IVPB (500 mg Intravenous New Bag/Given 08/03/24 1308)                                    Medical Decision Making Amount and/or Complexity of Data Reviewed External Data Reviewed: notes. Labs: ordered.    Details: Mild leukocytosis Radiology: ordered and independent interpretation performed.    Details: Right Upper Lobe infiltrate  Risk Prescription drug management. Decision regarding hospitalization.   Patient presents with hypoxia.  O2 sats are good with 2 L.  No respiratory distress.  No wheezing noted.  Unclear if this is related to his radiation versus pneumonia.  Slight leukocytosis.  Will cover for both  with antibiotics and Solu-Medrol .  Discussed with his oncologist, Dr. Sherrod, who asks for steroids and will see in the hospital.  Given he is on oxygen  he will need admission.  Discussed with Dr. [place] for admission.  With significant infiltrate seen, my suspicion for PE is pretty low.  No signs of sepsis at this time.     Final diagnoses:  Acute respiratory failure with hypoxia [facility])    ED Discharge Orders     None          Freddi Hamilton, MD 08/03/24 1424

## 2024-08-03 NOTE — Plan of Care (Signed)
   Problem: Education: Goal: Knowledge of General Education information will improve Description Including pain rating scale, medication(s)/side effects and non-pharmacologic comfort measures Outcome: Progressing   Problem: Health Behavior/Discharge Planning: Goal: Ability to manage health-related needs will improve Outcome: Progressing

## 2024-08-03 NOTE — ED Notes (Signed)
 Report given to the Floor RN.

## 2024-08-03 NOTE — ED Notes (Signed)
 Med Nec performed with RN over shoulder instructing what to select.

## 2024-08-03 NOTE — ED Notes (Signed)
 Pt complaining of feeling sweaty, no complaint of SOB/CP... Temp as noted... Provider informed.SABRASABRA

## 2024-08-03 NOTE — ED Notes (Signed)
 Triage RN placed Pt on O2.SABRA

## 2024-08-04 DIAGNOSIS — J189 Pneumonia, unspecified organism: Secondary | ICD-10-CM | POA: Insufficient documentation

## 2024-08-04 DIAGNOSIS — J9601 Acute respiratory failure with hypoxia: Secondary | ICD-10-CM | POA: Diagnosis not present

## 2024-08-04 LAB — CBC
HCT: 38.5 % — ABNORMAL LOW (ref 39.0–52.0)
Hemoglobin: 12 g/dL — ABNORMAL LOW (ref 13.0–17.0)
MCH: 25.3 pg — ABNORMAL LOW (ref 26.0–34.0)
MCHC: 31.2 g/dL (ref 30.0–36.0)
MCV: 81.1 fL (ref 80.0–100.0)
Platelets: 361 K/uL (ref 150–400)
RBC: 4.75 MIL/uL (ref 4.22–5.81)
RDW: 15.1 % (ref 11.5–15.5)
WBC: 10 K/uL (ref 4.0–10.5)
nRBC: 0 % (ref 0.0–0.2)

## 2024-08-04 LAB — BASIC METABOLIC PANEL WITH GFR
Anion gap: 13 (ref 5–15)
BUN: 18 mg/dL (ref 6–20)
CO2: 19 mmol/L — ABNORMAL LOW (ref 22–32)
Calcium: 9.3 mg/dL (ref 8.9–10.3)
Chloride: 106 mmol/L (ref 98–111)
Creatinine, Ser: 1.13 mg/dL (ref 0.61–1.24)
GFR, Estimated: >60 mL/min (ref >60)
Glucose, Bld: 198 mg/dL — ABNORMAL HIGH (ref 70–99)
Potassium: 4.5 mmol/L (ref 3.5–5.1)
Sodium: 139 mmol/L (ref 135–145)

## 2024-08-04 LAB — RESPIRATORY PANEL BY PCR

## 2024-08-04 LAB — MAGNESIUM: Magnesium: 2.8 mg/dL — ABNORMAL HIGH (ref 1.7–2.4)

## 2024-08-04 LAB — PHOSPHORUS: Phosphorus: 2.2 mg/dL — ABNORMAL LOW (ref 2.5–4.6)

## 2024-08-04 LAB — HIV ANTIBODY (ROUTINE TESTING W REFLEX): HIV Screen 4th Generation wRfx: NONREACTIVE

## 2024-08-04 MED ORDER — METHYLPREDNISOLONE SODIUM SUCC 125 MG IJ SOLR
125.0000 mg | Freq: Every day | INTRAMUSCULAR | Status: DC
Start: 2024-08-04 — End: 2024-08-05
  Administered 2024-08-04 – 2024-08-05 (×2): 125 mg via INTRAVENOUS
  Filled 2024-08-04 (×2): qty 2

## 2024-08-04 NOTE — Progress Notes (Signed)
 PROGRESS NOTE    Michael [name]  FMW:[id number] DOB: 1966/04/13 DOA: 08/03/2024 PCP: [name], MD    Brief Narrative:  Michael [name] ([id number]-sid-ron) is a 58 y.o. male with hx of renal cell carcinoma with brain, pulmonary, bone metastasis s/p chemo/radiation, resection cerebellar met, COPD, HTN, CKD2, hypothyroidism, who presents with SOB, and hypoxia on home pulse oximeter. Reports having about 1 week of shortness of breath, especially with exertion. Minimal / scant cough which has been nonproductive. No URI symptoms. He did have sick contact with children / grandchildren ~ 10 days ago, who have since mostly developed cold-like illness without specific Dx. Due to his symptoms saw PCP and had a CT scan on the 26th with dense consolidation in the RUL, bilateral LL adjacent to site of osseous met radiation, concerning for radiation pneumonitis; His last radiation treatment to these sites was in 3/'25. Was planning to fly to [place] and wife checked his POX which was reading ~ 56 for which he came to the ER.   Assessment and Plan: Community acquired pneumonia vs radiation pneumonitis  AHRF, on 2L O2   ~ 1.5 weeks of exertional SOB, minimal cough + sick contact with family who developed URI symptoms. At home POX ~ 88% on RA. In ED Desat to 86% on RA, currently requiring 2L O2. Other VS reassuring. WBC 10, lactate wnl. Flu / COVID / RSV neg. CXR with RUL, bibasilar opacities; recent CT Chest 9/26 with dense RUL, bilateral LL consolidation in vicinity of the R manubrial and T9 lesions; per rads consistent with progressive radiation pneumonitis. He is also on immunotherapy -Ceftriaxone  1 g IV daily, azithromycin 500 mg daily  - Steroids as directed by oncology-1 mg/kg - RVP negative - Wean O2 and do home O2 evaluation   Chronic medical problems:  Metastatic RCC, mets to brain, lung, bone: s/p chemoradiation, SRS brain, resection of cerebellar met. Followed by oncology Dr. Sherrod, currently on  immunotherapy with Nivolumab .  HTN: Continue home Amlodipine   CKD 2: Baseline Cr ~ 1.1-1.2  Hypothyroidism: Continue home levothyroxine     Body mass index is 39 kg/m. Obesity class II, would benefit from weight loss OP         Code Status: Full Code   Disposition Plan:  Level of care: Med-Surg Status is: Inpatient     Consultants:  Oncology  Subjective: Would like to get up and walk around Not symptomatic  Objective: Vitals:   08/03/24 2202 08/04/24 0200 08/04/24 0545 08/04/24 0749  BP:  124/81 123/86   Pulse:  64 75   Resp:  14 14   Temp:  97.6 F (36.4 C) 98.7 F (37.1 C)   TempSrc:  Oral Oral   SpO2:  93% 93% 93%  Weight: 109.6 kg     Height: 5' 6 (1.676 m)       Intake/Output Summary (Last 24 hours) at 08/04/2024 1029 Last data filed at 08/04/2024 9047 Gross per 24 hour  Intake 833.23 ml  Output --  Net 833.23 ml   Filed Weights   08/03/24 2202  Weight: 109.6 kg    Examination:   General: Appearance:    Obese male in no acute distress     Lungs:     Clear to auscultation bilaterally, respirations unlabored  Heart:    Normal heart rate. Normal rhythm. No murmurs, rubs, or gallops.    MS:   All extremities are intact.    Neurologic:   Awake, alert, oriented x 3. No apparent focal neurological  defect.        Data Reviewed: I have personally reviewed following labs and imaging studies  CBC: Recent Labs  Lab 08/03/24 1046 08/04/24 0541  WBC 10.9* 10.0  NEUTROABS 8.7*  --   HGB 11.8* 12.0*  HCT 37.3* 38.5*  MCV 80.2 81.1  PLT 342 361   Basic Metabolic Panel: Recent Labs  Lab 08/03/24 1046 08/04/24 0541  NA 138 139  K 4.6 4.5  CL 103 106  CO2 22 19*  GLUCOSE 107* 198*  BUN 9 18  CREATININE 1.25* 1.13  CALCIUM 9.3 9.3  MG  --  2.8*  PHOS  --  2.2*   GFR: Estimated Creatinine Clearance: 84.8 mL/min (by C-G formula based on SCr of 1.13 mg/dL). Liver Function Tests: Recent Labs  Lab 08/03/24 1046  AST 37  ALT  46*  ALKPHOS 144*  BILITOT 0.5  PROT 7.4  ALBUMIN 3.6   No results for input(s): LIPASE, AMYLASE in the last 168 hours. No results for input(s): AMMONIA in the last 168 hours. Coagulation Profile: No results for input(s): INR, PROTIME in the last 168 hours. Cardiac Enzymes: No results for input(s): CKTOTAL, CKMB, CKMBINDEX, TROPONINI in the last 168 hours. BNP (last 3 results) No results for input(s): PROBNP in the last 8760 hours. HbA1C: No results for input(s): HGBA1C in the last 72 hours. CBG: No results for input(s): GLUCAP in the last 168 hours. Lipid Profile: No results for input(s): CHOL, HDL, LDLCALC, TRIG, CHOLHDL, LDLDIRECT in the last 72 hours. Thyroid  Function Tests: No results for input(s): TSH, T4TOTAL, FREET4, T3FREE, THYROIDAB in the last 72 hours. Anemia Panel: No results for input(s): VITAMINB12, FOLATE, FERRITIN, TIBC, IRON, RETICCTPCT in the last 72 hours. Sepsis Labs: Recent Labs  Lab 08/03/24 1046  LATICACIDVEN 1.1    Recent Results (from the past 240 hours)  Culture, blood (routine x 2)     Status: None (Preliminary result)   Collection Time: 08/03/24 10:25 AM   Specimen: BLOOD  Result Value Ref Range Status   Specimen Description   Final    BLOOD RIGHT ANTECUBITAL Performed at [facility], [address] [place], [place] [address]    Special Requests   Final    BOTTLES DRAWN AEROBIC AND ANAEROBIC Blood Culture adequate volume Performed at [facility], [address] [place], [place] [address]    Culture   Final    NO GROWTH < 24 HOURS Performed at [facility], [address], [place], [place] [address]    Report Status PENDING  Incomplete  Resp panel by RT-PCR (RSV, Flu A&B, Covid) Anterior Nasal Swab     Status: None   Collection Time: 08/03/24 10:28 AM   Specimen: Anterior Nasal Swab  Result Value Ref Range Status   SARS  Coronavirus 2 by RT PCR NEGATIVE NEGATIVE Final    Comment: (NOTE) SARS-CoV-2 target nucleic acids are NOT DETECTED.  The SARS-CoV-2 RNA is generally detectable in upper respiratory specimens during the acute phase of infection. The lowest concentration of SARS-CoV-2 viral copies this assay can detect is 138 copies/mL. A negative result does not preclude SARS-Cov-2 infection and should not be used as the sole basis for treatment or other patient management decisions. A negative result may occur with  improper specimen collection/handling, submission of specimen other than nasopharyngeal swab, presence of viral mutation(s) within the areas targeted by this assay, and inadequate number of viral copies(<138 copies/mL). A negative result must be combined with clinical observations,  patient history, and epidemiological information. The expected result is Negative.  Fact Sheet for Patients:  BloggerCourse.com  Fact Sheet for Healthcare Providers:  SeriousBroker.it  This test is no t yet approved or cleared by the [place]  FDA and  has been authorized for detection and/or diagnosis of SARS-CoV-2 by FDA under an Emergency Use Authorization (EUA). This EUA will remain  in effect (meaning this test can be used) for the duration of the COVID-19 declaration under Section 564(b)(1) of the Act, 21 U.S.C.section 360bbb-3(b)(1), unless the authorization is terminated  or revoked sooner.       Influenza A by PCR NEGATIVE NEGATIVE Final   Influenza B by PCR NEGATIVE NEGATIVE Final    Comment: (NOTE) The Xpert Xpress SARS-CoV-2/FLU/RSV plus assay is intended as an aid in the diagnosis of influenza from Nasopharyngeal swab specimens and should not be used as a sole basis for treatment. Nasal washings and aspirates are unacceptable for Xpert Xpress SARS-CoV-2/FLU/RSV testing.  Fact Sheet for  Patients: BloggerCourse.com  Fact Sheet for Healthcare Providers: SeriousBroker.it  This test is not yet approved or cleared by the [place]  FDA and has been authorized for detection and/or diagnosis of SARS-CoV-2 by FDA under an Emergency Use Authorization (EUA). This EUA will remain in effect (meaning this test can be used) for the duration of the COVID-19 declaration under Section 564(b)(1) of the Act, 21 U.S.C. section 360bbb-3(b)(1), unless the authorization is terminated or revoked.     Resp Syncytial Virus by PCR NEGATIVE NEGATIVE Final    Comment: (NOTE) Fact Sheet for Patients: BloggerCourse.com  Fact Sheet for Healthcare Providers: SeriousBroker.it  This test is not yet approved or cleared by the [place]  FDA and has been authorized for detection and/or diagnosis of SARS-CoV-2 by FDA under an Emergency Use Authorization (EUA). This EUA will remain in effect (meaning this test can be used) for the duration of the COVID-19 declaration under Section 564(b)(1) of the Act, 21 U.S.C. section 360bbb-3(b)(1), unless the authorization is terminated or revoked.  Performed at [facility], [address], [place], [place] [address]   Culture, blood (routine x 2)     Status: None (Preliminary result)   Collection Time: 08/03/24 10:30 AM   Specimen: BLOOD LEFT HAND  Result Value Ref Range Status   Specimen Description   Final    BLOOD LEFT HAND Performed at [facility], [address], [place], [place] [address]    Special Requests   Final    BOTTLES DRAWN AEROBIC AND ANAEROBIC Blood Culture adequate volume Performed at [facility], [address] [place], [place] [address]    Culture   Final    NO GROWTH < 24 HOURS Performed at [facility], [address], [place], [place] [address]    Report Status PENDING   Incomplete  Respiratory (~20 pathogens) panel by PCR     Status: None   Collection Time: 08/03/24 11:37 PM   Specimen: Nasopharyngeal Swab; Respiratory  Result Value Ref Range Status   Adenovirus NOT DETECTED NOT DETECTED Final   Coronavirus 229E NOT DETECTED NOT DETECTED Final    Comment: (NOTE) The Coronavirus on the Respiratory Panel, DOES NOT test for the novel  Coronavirus (2019 nCoV)    Coronavirus HKU1 NOT DETECTED NOT DETECTED Final   Coronavirus NL63 NOT DETECTED NOT DETECTED Final   Coronavirus OC43 NOT DETECTED NOT DETECTED Final   Metapneumovirus NOT DETECTED NOT DETECTED Final   Rhinovirus / Enterovirus NOT DETECTED  NOT DETECTED Final   Influenza A NOT DETECTED NOT DETECTED Final   Influenza B NOT DETECTED NOT DETECTED Final   Parainfluenza Virus 1 NOT DETECTED NOT DETECTED Final   Parainfluenza Virus 2 NOT DETECTED NOT DETECTED Final   Parainfluenza Virus 3 NOT DETECTED NOT DETECTED Final   Parainfluenza Virus 4 NOT DETECTED NOT DETECTED Final   Respiratory Syncytial Virus NOT DETECTED NOT DETECTED Final   Bordetella pertussis NOT DETECTED NOT DETECTED Final   Bordetella Parapertussis NOT DETECTED NOT DETECTED Final   Chlamydophila pneumoniae NOT DETECTED NOT DETECTED Final   Mycoplasma pneumoniae NOT DETECTED NOT DETECTED Final    Comment: Performed at [facility], [address], [place], [place] [address]         Radiology Studies: DG Chest 2 View Result Date: 08/03/2024 CLINICAL DATA:  Cough, dyspnea.  History of renal cell carcinoma. EXAM: CHEST - 2 VIEW COMPARISON:  July 13, 2020.  July 30, 2024. FINDINGS: Stable cardiomediastinal silhouette. Bilateral lower lobe opacities are noted concerning for pneumonia or atelectasis. Continued right upper lobe opacity is noted concerning for pneumonia or radiation pneumonitis as noted on prior CT scan. Bony thorax is unremarkable. IMPRESSION: Bilateral lower lobe opacities are noted concerning for  pneumonia or atelectasis. Continued right upper lobe opacity is noted concerning for pneumonia or radiation pneumonitis as noted on prior CT scan. Electronically Signed   By: Lynwood Landy Raddle M.D.   On: 08/03/2024 11:18        Scheduled Meds:  amLODipine   7.5 mg Oral QAC breakfast   azithromycin  500 mg Oral Daily   enoxaparin (LOVENOX) injection  60 mg Subcutaneous Q24H   fluticasone furoate-vilanterol  1 puff Inhalation Daily   guaiFENesin  600 mg Oral BID   ipratropium-albuterol  3 mL Nebulization Q6H   levothyroxine   150 mcg Oral Q0600   methylPREDNISolone  (SOLU-MEDROL ) injection  125 mg Intravenous Daily   montelukast   10 mg Oral QAC breakfast   sodium chloride  flush  3 mL Intravenous Q12H   Continuous Infusions:  sodium chloride  Stopped (08/03/24 1308)   cefTRIAXone  (ROCEPHIN )  IV       LOS: 1 day    Time spent: 45 minutes spent on chart review, discussion with nursing staff, consultants, updating family and interview/physical exam; more than 50% of that time was spent in counseling and/or coordination of care.    Harlene RAYMOND Bowl, DO [facility] Available via Epic secure chat 7am-7pm After these hours, please refer to coverage provider listed on [email or web address] 08/04/2024, 10:29 AM

## 2024-08-04 NOTE — Plan of Care (Signed)
  Problem: Education: Goal: Knowledge of General Education information will improve Description: Including pain rating scale, medication(s)/side effects and non-pharmacologic comfort measures Outcome: Progressing   Problem: Health Behavior/Discharge Planning: Goal: Ability to manage health-related needs will improve Outcome: Progressing   Problem: Clinical Measurements: Goal: Ability to maintain clinical measurements within normal limits will improve Outcome: Progressing   Problem: Activity: Goal: Risk for activity intolerance will decrease Outcome: Progressing   Problem: Nutrition: Goal: Adequate nutrition will be maintained Outcome: Progressing   Problem: Pain Managment: Goal: General experience of comfort will improve and/or be controlled Outcome: Progressing   Problem: Safety: Goal: Ability to remain free from injury will improve Outcome: Progressing

## 2024-08-04 NOTE — Plan of Care (Signed)
   Problem: Education: Goal: Knowledge of General Education information will improve Description Including pain rating scale, medication(s)/side effects and non-pharmacologic comfort measures Outcome: Progressing

## 2024-08-04 NOTE — Progress Notes (Addendum)
 Michael [name]   DOB:Apr 23, 1966   FM#:[id number]      ASSESSMENT & PLAN:  Michael [name] is a 58 year old male patient with oncologic history significant for RCC with pulmonary mets.  He was admitted on 08/03/2024 with complaints of shortness of breath.  Medical oncology Dr. Sherrod following.  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  Evidence of pulmonary mets in 2021. - He is status post right nephrectomy 08/27/2019. - Status post treatment with Keytruda  and axitinib .  Status post cryoablation 05/30/2021. - Diagnosed with solitary brain mets November 2024 and status post [facility] with surgical resection. - Started on nivolumab  and Cabometyx .  Cabometyx  discontinued due to intolerance.  Tolerates nivolumab  well. - MR brain done 07/16/2024 stable. - CT imaging done 07/30/2024 shows no evidence of local recurrence right nephrectomy bed.  Imaging shows new small focal soft tissue dilatation left ureter.  Consideration for cystoscopy and ureteroscopy for further evaluation. - Medical oncology/Dr. Sherrod following and will make further evaluation and treatment recommendations.  Shortness of breath Radiation pneumonitis Concern for pneumonia - Patient complains of a week and a half history of progressive shortness of breath. - Continue IV antibiotics as ordered - Recommend steroids/prednisone  1 mg/kg.  Will taper dose. - Continue O2 support and supportive care.  Anemia, normocytic - Mild - Hemoglobin 12.0 today - Continue to monitor CBC with differential  Hypertension - Continue antihypertensives and monitor BP    Code Status Full   Subjective:  Patient seen awake and alert sitting in chair at bedside.  Family member at bedside.  Reports that for approximately 1-1/2 weeks he had been feeling progressively short of breath despite continuing to play tennis and perform regular ADLs.  States that he and  his wife were on the way to dialysis when his wife decided to check his oxygen  level and noted that it was low.  Went to see his PCP last Friday who performed imaging scans and recommended further care.    Objective:   Intake/Output Summary (Last 24 hours) at 08/04/2024 1042 Last data filed at 08/04/2024 9047 Gross per 24 hour  Intake 833.23 ml  Output --  Net 833.23 ml     PHYSICAL EXAMINATION: ECOG PERFORMANCE STATUS: 1 - Symptomatic but completely ambulatory  Vitals:   08/04/24 1000 08/04/24 1037  BP:  118/73  Pulse:  73  Resp:  17  Temp:  98.3 F (36.8 C)  SpO2: 91% 93%   Filed Weights   08/03/24 2202  Weight: 241 lb 10 oz (109.6 kg)    GENERAL: alert, no distress and comfortable SKIN: skin color, texture, turgor are normal, no rashes or significant lesions EYES: normal, conjunctiva are pink and non-injected, sclera clear OROPHARYNX: no exudate, no erythema and lips, buccal mucosa, and tongue normal  NECK: supple, thyroid  normal size, non-tender, without nodularity LYMPH: no palpable lymphadenopathy in the cervical, axillary or inguinal LUNGS: clear to auscultation and percussion with normal breathing effort HEART: regular rate & rhythm and no murmurs and no lower extremity edema ABDOMEN: abdomen soft, non-tender and normal bowel sounds MUSCULOSKELETAL: no cyanosis of digits and no clubbing  PSYCH: alert & oriented x 3 with fluent speech NEURO: no focal motor/sensory deficits   All questions were answered. The patient knows to call the clinic with any problems, questions or concerns.   The total time spent in the appointment was 40 minutes encounter with patient including review of chart and  various tests results, discussions about plan of care and coordination of care plan  Olam JINNY Brunner, NP 08/04/2024 10:42 AM    Labs Reviewed:  Lab Results  Component Value Date   WBC 10.0 08/04/2024   HGB 12.0 (L) 08/04/2024   HCT 38.5 (L) 08/04/2024   MCV 81.1 08/04/2024    PLT 361 08/04/2024   Recent Labs    06/11/24 1306 07/19/24 0806 08/03/24 1046 08/04/24 0541  NA 140 139 138 139  K 3.7 4.1 4.6 4.5  CL 107 105 103 106  CO2 27 26 22  19*  GLUCOSE 96 106* 107* 198*  BUN 16 11 9 18   CREATININE 1.22 1.30* 1.25* 1.13  CALCIUM 8.8* 8.6* 9.3 9.3  GFRNONAA >60 >60 >60 >60  PROT 7.1 7.2 7.4  --   ALBUMIN 4.2 3.7 3.6  --   AST 23 22 37  --   ALT 22 25 46*  --   ALKPHOS 87 72 144*  --   BILITOT 0.6 0.5 0.5  --     Studies Reviewed:  DG Chest 2 View Result Date: 08/03/2024 CLINICAL DATA:  Cough, dyspnea.  History of renal cell carcinoma. EXAM: CHEST - 2 VIEW COMPARISON:  July 13, 2020.  July 30, 2024. FINDINGS: Stable cardiomediastinal silhouette. Bilateral lower lobe opacities are noted concerning for pneumonia or atelectasis. Continued right upper lobe opacity is noted concerning for pneumonia or radiation pneumonitis as noted on prior CT scan. Bony thorax is unremarkable. IMPRESSION: Bilateral lower lobe opacities are noted concerning for pneumonia or atelectasis. Continued right upper lobe opacity is noted concerning for pneumonia or radiation pneumonitis as noted on prior CT scan. Electronically Signed   By: Lynwood Landy Raddle M.D.   On: 08/03/2024 11:18   CT CHEST ABDOMEN PELVIS WO CONTRAST Result Date: 07/30/2024 CLINICAL DATA:  Renal cell carcinoma. Status post radiation therapy 2 thoracic spine lesion and RIGHT manubrial lesion * Tracking Code: BO * EXAM: CT CHEST, ABDOMEN AND PELVIS WITHOUT CONTRAST TECHNIQUE: Multidetector CT imaging of the chest, abdomen and pelvis was performed following the standard protocol without IV contrast. RADIATION DOSE REDUCTION: This exam was performed according to the departmental dose-optimization program which includes automated exposure control, adjustment of the mA and/or kV according to patient size and/or use of iterative reconstruction technique. COMPARISON:  04/29/2024 FINDINGS: CT CHEST FINDINGS  Cardiovascular: No significant vascular findings. Normal heart size. No pericardial effusion. Mediastinum/Nodes: No axillary or supraclavicular adenopathy. No mediastinal or hilar adenopathy. No pericardial fluid. Esophagus normal. Lungs/Pleura: There is new dense airspace consolidation in the RIGHT upper lobe and new dense airspace consolidation in the RIGHT and LEFT lower lobe. These dense airspace consolidation patterns are in the vicinity of the RIGHT manubrial lesion and the T9 vertebral body level lesion. Musculoskeletal: Lytic lesion in the RIGHT aspect of the manubrium measures 5.5 by 2.7 cm compared to 6.3 x 4.0 cm. Lesion at the T9 vertebral body with anterior soft tissue expansion measures 2.7 by 1.8 cm (image 43/series 2) compared to 2.9 x 2.3 cm. No new lytic lesions identified in the thoracic skeleton CT ABDOMEN AND PELVIS FINDINGS Hepatobiliary: Multiple hypodense lesions in the liver again demonstrated. These lesions have simple fluid attenuation. No change in size. No new lesions. Noncontrast exam noted. Pancreas: Pancreas is normal. No ductal dilatation. No pancreatic inflammation. Spleen: Normal spleen Adrenals/urinary tract: Adrenal glands normal. Post RIGHT nephrectomy. No nodularity in nephrectomy bed. LEFT kidney appears normal on noncontrast exam. Small exophytic lesion the mid LEFT kidney  measuring 12 mm is unchanged (image 68/2) The LEFT ureter is decompressed through the majority of its course. There is 1 focus of focal dilatation measuring 8 mm on image 100/2. This focal dilatation is soft tissue density and is new from prior. There is an adjacent small retroperitoneal rounded body measuring 3 mm which is unchanged. Bladder normal. Stomach/Bowel: Stomach, small bowel, appendix, and cecum are normal. The colon and rectosigmoid colon are normal. Vascular/Lymphatic: Abdominal aorta is normal caliber. There is no retroperitoneal or periportal lymphadenopathy. No pelvic lymphadenopathy.  Reproductive: Prostate unremarkable Other: No free fluid. Musculoskeletal: No aggressive osseous lesion. IMPRESSION: CHEST: 1. New dense airspace consolidation in the RIGHT upper lobe and RIGHT and LEFT lower lobes adjacent to osseous radiation treatment sites. Findings most consistent with significant progressive radiation pneumonitis. 2. No mediastinal adenopathy. 3. Interval decrease in size of lytic lesion in the RIGHT aspect of the manubrium. 4. Interval decrease in size of lytic lesion at the T9 vertebral body. PELVIS: 1. No evidence of local recurrence in the RIGHT nephrectomy bed. 2. New small focal soft tissue dilatation of the LEFT ureter without obstruction. Recommend CT urogram with contrast or MRI urogram without contrast if patient cannot receive IV contrast. Alternatively consider cystoscopy and ureteroscopy for evaluation depending on clinical concern. These results will be called to the ordering clinician or representative by the Radiologist Assistant, and communication documented in the PACS or [employer]. Electronically Signed   By: Jackquline Boxer M.D.   On: 07/30/2024 10:54   MR LUMBAR SPINE WO CONTRAST Result Date: 07/16/2024 CLINICAL DATA:  Lumbar radiculopathy EXAM: MRI LUMBAR SPINE WITHOUT CONTRAST TECHNIQUE: Multiplanar, multisequence MR imaging of the lumbar spine was performed. No intravenous contrast was administered. COMPARISON:  CT chest abdomen pelvis 04/29/2024, CT PET January 22, 2024 FINDINGS: Bone marrow: There is a hemangioma of the S2 level. There is a large area of low signal that affects the left side of the sacrum and also the left iliac bone. This has low signal on the axial T1 and T2 weighted images, but appears normal on the sagittal images. No abnormality seen on the STIR image. There is no abnormality seen in this location on the CT from 04/29/2024 or the CT PET from January 22, 2024. This is thought most likely an artifact. Conus and cauda equina: No significant  abnormality Paraspinal tissues: No significant abnormality L1-L2: Normal L2-L3: Normal L3-L4: The disc is normal.  There is mild facet arthropathy L4-L5: There is mild degenerative disc disease. There is a left paracentral disc herniation compressing the left L5 nerve root. L5-S1: Normal IMPRESSION: The vertebra are numbered such the last well-formed disc is L5-S1. There is a left paracentral disc herniation at L4-5 compressing the left L5 nerve root. Electronically Signed   By: Nancyann Burns M.D.   On: 07/16/2024 11:24   MR Brain W Wo Contrast Result Date: 07/16/2024 CLINICAL DATA:  Follow-up brain metastases EXAM: MRI HEAD WITHOUT AND WITH CONTRAST TECHNIQUE: Multiplanar, multiecho pulse sequences of the brain and surrounding structures were obtained without and with intravenous contrast. CONTRAST:  10mL GADAVIST  GADOBUTROL  1 MMOL/ML IV SOLN COMPARISON:  April 14, 2024 FINDINGS: MRI brain: There are postoperative changes in the right posterior fossa from resection of a cerebellar lesion. There is mild dural enhancement and there is T2 hyperintensity in the inferior cerebellum, which is unchanged. There is a small overlying pseudomeningocele There are no new brain metastases identified. There is no acute or chronic infarct. The ventricles are normal. There  are normal flow signals in the carotid arteries and basilar artery. No significant bone marrow signal abnormality. No significant abnormality in the paranasal sinuses or soft tissues. IMPRESSION: Postoperative changes from resection of metastatic lesion in the right cerebellum. No new lesions identified. Electronically Signed   By: Nancyann Burns M.D.   On: 07/16/2024 11:15   ADDENDUM: Hematology/Oncology Attending: The patient is seen today.  I agree with the above note.  His wife was at the bedside.  This is a very pleasant 58 years old white male with stage IV clear-cell renal cell carcinoma with pulmonary, bone and brain metastasis he status post several  treatment in the past and most recently has been on treatment with nivolumab  480 mg IV every 4 weeks.  He was on treatment with Cabometyx  in the past but this was discontinued secondary to liver dysfunction.  The patient was admitted to the hospital with shortness of breath and oxygen  saturation was in the upper 80s.  He had previous CT scan of the chest, abdomen and pelvis on 07/30/2024 that showed no evidence for local recurrence or disease progression but there was new dense airspace consolidation in the right upper lobe as well as the right and left lower lobes just concerning for progressive radiation pneumonitis but pneumonia could not be completely excluded.  There was no mediastinal adenopathy and there was interval decrease in the size of the lytic lesion in the right aspect of the manubrium and interval decrease in the size of the lytic lesion in the T9 vertebral.  The abdominal and pelvic exam showed no evidence of local recurrence but there was new small focal soft tissue dilatation of the left ureter without obstruction. I had a lengthy discussion with the patient and his wife today about his condition. I recommended for him to continue on a tapered dose of prednisone  over the next several weeks.  We will cancel his immunotherapy that was scheduled in 2 weeks. He will probably benefit from being on antibiotics for the next 10 days because of concern about possibility of pneumonia. For the left ureteral dilatation, we will consult urology for evaluation of his condition based on his request during this hospitalization. Thank you for taking good care of of Mr. Diamant.  We will continue to follow-up the patient with you and assist in his management on as-needed basis. Disclaimer: This note was dictated with voice recognition software. Similar sounding words can inadvertently be transcribed and may be missed upon review. Sherrod MARLA Sherrod, MD

## 2024-08-05 ENCOUNTER — Inpatient Hospital Stay (HOSPITAL_COMMUNITY)

## 2024-08-05 DIAGNOSIS — J9601 Acute respiratory failure with hypoxia: Secondary | ICD-10-CM | POA: Diagnosis not present

## 2024-08-05 LAB — EXPECTORATED SPUTUM ASSESSMENT W GRAM STAIN, RFLX TO RESP C

## 2024-08-05 MED ORDER — PREDNISONE 10 MG PO TABS: ORAL_TABLET | ORAL | 0 refills | Status: DC

## 2024-08-05 MED ORDER — GUAIFENESIN ER 600 MG PO TB12: 600.0000 mg | ORAL_TABLET | Freq: Two times a day (BID) | ORAL | Status: DC | PRN

## 2024-08-05 MED ORDER — IPRATROPIUM-ALBUTEROL 0.5-2.5 (3) MG/3ML IN SOLN: 3.0000 mL | Freq: Two times a day (BID) | RESPIRATORY_TRACT | Status: DC

## 2024-08-05 MED ORDER — IOHEXOL 300 MG/ML  SOLN
100.0000 mL | Freq: Once | INTRAMUSCULAR | Status: AC | PRN
  Administered 2024-08-05: 100 mL via INTRAVENOUS

## 2024-08-05 MED ORDER — IPRATROPIUM-ALBUTEROL 0.5-2.5 (3) MG/3ML IN SOLN
3.0000 mL | Freq: Two times a day (BID) | RESPIRATORY_TRACT | Status: DC
  Administered 2024-08-05: 3 mL via RESPIRATORY_TRACT
  Filled 2024-08-05: qty 3

## 2024-08-05 MED ORDER — SODIUM CHLORIDE (PF) 0.9 % IJ SOLN
INTRAMUSCULAR | Status: AC
  Filled 2024-08-05: qty 250

## 2024-08-05 MED ORDER — DOXYCYCLINE HYCLATE 100 MG PO TABS: 100.0000 mg | ORAL_TABLET | Freq: Two times a day (BID) | ORAL | 0 refills | Status: DC

## 2024-08-05 NOTE — Discharge Summary (Signed)
 Physician Discharge Summary  [name] FMW:[id number] DOB: [birth year] DOA: 08/03/2024  PCP: [name], MD  Admit date: 08/03/2024 Discharge date: 08/05/2024  Admitted From:  Discharge disposition: Home   Recommendations for Outpatient Follow-Up:   CT scan pending read-urology to follow-up and set up appointment Per oncology: Needs slow taper of prednisone  over several weeks Course of antibiotics for suspected pneumonia   Discharge Diagnosis:   Principal Problem:   Acute hypoxemic respiratory failure (HCC) Active Problems:   CAP (community acquired pneumonia)    Discharge Condition: Improved.  Diet recommendation:  Regular.  Wound care: None.  Code status: Full.   History of Present Illness:   Michael [name] (E-sid-ron) is a 58 y.o. male with hx of renal cell carcinoma with brain, pulmonary, bone metastasis s/p chemo/radiation, resection cerebellar met, COPD, HTN, CKD2, hypothyroidism, who presents with SOB, and hypoxia on home pulse oximeter. Reports having about 1 week of shortness of breath, especially with exertion. Minimal / scant cough which has been nonproductive. No URI symptoms. He did have sick contact with children / grandchildren ~ 10 days ago, who have since mostly developed cold-like illness without specific Dx. Due to his symptoms saw PCP and had a CT scan on the 26th with dense consolidation in the RUL, bilateral LL adjacent to site of osseous met radiation, concerning for radiation pneumonitis; His last radiation treatment to these sites was in 3/'25. He was prescribed Advair inahler (has no inhalers at home) and just started using this on Monday. Was planning to fly to [place] and wife checked his POX which was reading ~ 58 for which he came to the ER.    Hospital Course by Problem:   Community acquired pneumonia vs radiation pneumonitis  AHRF, on 2L O2   ~ 1.5 weeks of exertional SOB, minimal cough + sick contact with family who developed URI  symptoms. At home POX ~ 88% on RA. In ED Desat to 86% on RA, currently requiring 2L O2. Other VS reassuring. WBC 10, lactate wnl. Flu / COVID / RSV neg. CXR with RUL, bibasilar opacities; recent CT Chest 9/26 with dense RUL, bilateral LL consolidation in vicinity of the R manubrial and T9 lesions; per rads consistent with progressive radiation pneumonitis. He is also on immunotherapy -Ceftriaxone  1 g IV daily, azithromycin 500 mg daily-changed to p.o. antibiotics and finish course - Steroids as directed by oncology-1 mg/kg-slow taper over the next several weeks - RVP negative - No longer requiring oxygen    Chronic medical problems:  Metastatic RCC, mets to brain, lung, bone: s/p chemoradiation, SRS brain, resection of cerebellar met. Followed by oncology Dr. Sherrod HTN: Continue home Amlodipine   CKD 2: Baseline Cr ~ 1.1-1.2  Hypothyroidism: Continue home levothyroxine      Medical Consultants:      Discharge Exam:   Vitals:   08/05/24 0528 08/05/24 0909  BP: 120/74   Pulse: 70   Resp: 18   Temp: (!) 97.5 F (36.4 C)   SpO2: 95% 95%   Vitals:   08/04/24 1528 08/04/24 2029 08/05/24 0528 08/05/24 0909  BP:  126/71 120/74   Pulse:  87 70   Resp:  18 18   Temp:  97.8 F (36.6 C) (!) 97.5 F (36.4 C)   TempSrc:  Oral Oral   SpO2: 93% 93% 95% 95%  Weight:      Height:        General exam: Appears calm and comfortable.    The results of significant  diagnostics from this hospitalization (including imaging, microbiology, ancillary and laboratory) are listed below for reference.     Procedures and Diagnostic Studies:   DG Chest 2 View Result Date: 08/03/2024 CLINICAL DATA:  Cough, dyspnea.  History of renal cell carcinoma. EXAM: CHEST - 2 VIEW COMPARISON:  July 13, 2020.  July 30, 2024. FINDINGS: Stable cardiomediastinal silhouette. Bilateral lower lobe opacities are noted concerning for pneumonia or atelectasis. Continued right upper lobe opacity is noted  concerning for pneumonia or radiation pneumonitis as noted on prior CT scan. Bony thorax is unremarkable. IMPRESSION: Bilateral lower lobe opacities are noted concerning for pneumonia or atelectasis. Continued right upper lobe opacity is noted concerning for pneumonia or radiation pneumonitis as noted on prior CT scan. Electronically Signed   By: Lynwood Landy Raddle M.D.   On: 08/03/2024 11:18     Labs:   Basic Metabolic Panel: Recent Labs  Lab 08/03/24 1046 08/04/24 0541  NA 138 139  K 4.6 4.5  CL 103 106  CO2 22 19*  GLUCOSE 107* 198*  BUN 9 18  CREATININE 1.25* 1.13  CALCIUM 9.3 9.3  MG  --  2.8*  PHOS  --  2.2*   GFR Estimated Creatinine Clearance: 84.8 mL/min (by C-G formula based on SCr of 1.13 mg/dL). Liver Function Tests: Recent Labs  Lab 08/03/24 1046  AST 37  ALT 46*  ALKPHOS 144*  BILITOT 0.5  PROT 7.4  ALBUMIN 3.6   No results for input(s): LIPASE, AMYLASE in the last 168 hours. No results for input(s): AMMONIA in the last 168 hours. Coagulation profile No results for input(s): INR, PROTIME in the last 168 hours.  CBC: Recent Labs  Lab 08/03/24 1046 08/04/24 0541  WBC 10.9* 10.0  NEUTROABS 8.7*  --   HGB 11.8* 12.0*  HCT 37.3* 38.5*  MCV 80.2 81.1  PLT 342 361   Cardiac Enzymes: No results for input(s): CKTOTAL, CKMB, CKMBINDEX, TROPONINI in the last 168 hours. BNP: Invalid input(s): POCBNP CBG: No results for input(s): GLUCAP in the last 168 hours. D-Dimer No results for input(s): DDIMER in the last 72 hours. Hgb A1c No results for input(s): HGBA1C in the last 72 hours. Lipid Profile No results for input(s): CHOL, HDL, LDLCALC, TRIG, CHOLHDL, LDLDIRECT in the last 72 hours. Thyroid  function studies No results for input(s): TSH, T4TOTAL, T3FREE, THYROIDAB in the last 72 hours.  Invalid input(s): FREET3 Anemia work up No results for input(s): VITAMINB12, FOLATE, FERRITIN, TIBC, IRON,  RETICCTPCT in the last 72 hours. Microbiology Recent Results (from the past 240 hours)  Culture, blood (routine x 2)     Status: None (Preliminary result)   Collection Time: 08/03/24 10:25 AM   Specimen: BLOOD  Result Value Ref Range Status   Specimen Description   Final    BLOOD RIGHT ANTECUBITAL Performed at [facility], [address], [place], [place] [address]    Special Requests   Final    BOTTLES DRAWN AEROBIC AND ANAEROBIC Blood Culture adequate volume Performed at [facility], [address], [place], [place] [address]    Culture   Final    NO GROWTH 2 DAYS Performed at [facility], [address], [place], [place] [address]    Report Status PENDING  Incomplete  Resp panel by RT-PCR (RSV, Flu A&B, Covid) Anterior Nasal Swab     Status: None   Collection Time: 08/03/24 10:28 AM   Specimen: Anterior Nasal Swab  Result Value Ref Range Status  SARS Coronavirus 2 by RT PCR NEGATIVE NEGATIVE Final    Comment: (NOTE) SARS-CoV-2 target nucleic acids are NOT DETECTED.  The SARS-CoV-2 RNA is generally detectable in upper respiratory specimens during the acute phase of infection. The lowest concentration of SARS-CoV-2 viral copies this assay can detect is 138 copies/mL. A negative result does not preclude SARS-Cov-2 infection and should not be used as the sole basis for treatment or other patient management decisions. A negative result may occur with  improper specimen collection/handling, submission of specimen other than nasopharyngeal swab, presence of viral mutation(s) within the areas targeted by this assay, and inadequate number of viral copies(<138 copies/mL). A negative result must be combined with clinical observations, patient history, and epidemiological information. The expected result is Negative.  Fact Sheet for Patients:  BloggerCourse.com  Fact Sheet for Healthcare Providers:   SeriousBroker.it  This test is no t yet approved or cleared by the [place]  FDA and  has been authorized for detection and/or diagnosis of SARS-CoV-2 by FDA under an Emergency Use Authorization (EUA). This EUA will remain  in effect (meaning this test can be used) for the duration of the COVID-19 declaration under Section 564(b)(1) of the Act, 21 U.S.C.section 360bbb-3(b)(1), unless the authorization is terminated  or revoked sooner.       Influenza A by PCR NEGATIVE NEGATIVE Final   Influenza B by PCR NEGATIVE NEGATIVE Final    Comment: (NOTE) The Xpert Xpress SARS-CoV-2/FLU/RSV plus assay is intended as an aid in the diagnosis of influenza from Nasopharyngeal swab specimens and should not be used as a sole basis for treatment. Nasal washings and aspirates are unacceptable for Xpert Xpress SARS-CoV-2/FLU/RSV testing.  Fact Sheet for Patients: BloggerCourse.com  Fact Sheet for Healthcare Providers: SeriousBroker.it  This test is not yet approved or cleared by the [place]  FDA and has been authorized for detection and/or diagnosis of SARS-CoV-2 by FDA under an Emergency Use Authorization (EUA). This EUA will remain in effect (meaning this test can be used) for the duration of the COVID-19 declaration under Section 564(b)(1) of the Act, 21 U.S.C. section 360bbb-3(b)(1), unless the authorization is terminated or revoked.     Resp Syncytial Virus by PCR NEGATIVE NEGATIVE Final    Comment: (NOTE) Fact Sheet for Patients: BloggerCourse.com  Fact Sheet for Healthcare Providers: SeriousBroker.it  This test is not yet approved or cleared by the [place]  FDA and has been authorized for detection and/or diagnosis of SARS-CoV-2 by FDA under an Emergency Use Authorization (EUA). This EUA will remain in effect (meaning this test can be used) for  the duration of the COVID-19 declaration under Section 564(b)(1) of the Act, 21 U.S.C. section 360bbb-3(b)(1), unless the authorization is terminated or revoked.  Performed at [facility], [address] [place], [place] [address]   Culture, blood (routine x 2)     Status: None (Preliminary result)   Collection Time: 08/03/24 10:30 AM   Specimen: BLOOD LEFT HAND  Result Value Ref Range Status   Specimen Description   Final    BLOOD LEFT HAND Performed at [facility], [address] [place], [place] [address]    Special Requests   Final    BOTTLES DRAWN AEROBIC AND ANAEROBIC Blood Culture adequate volume Performed at [facility], [address] [place], [place] [address]    Culture   Final    NO GROWTH 2 DAYS Performed at [facility], [address], [place], [place] [address]  Report Status PENDING  Incomplete  Respiratory (~20 pathogens) panel by PCR     Status: None   Collection Time: 08/03/24 11:37 PM   Specimen: Nasopharyngeal Swab; Respiratory  Result Value Ref Range Status   Adenovirus NOT DETECTED NOT DETECTED Final   Coronavirus 229E NOT DETECTED NOT DETECTED Final    Comment: (NOTE) The Coronavirus on the Respiratory Panel, DOES NOT test for the novel  Coronavirus (2019 nCoV)    Coronavirus HKU1 NOT DETECTED NOT DETECTED Final   Coronavirus NL63 NOT DETECTED NOT DETECTED Final   Coronavirus OC43 NOT DETECTED NOT DETECTED Final   Metapneumovirus NOT DETECTED NOT DETECTED Final   Rhinovirus / Enterovirus NOT DETECTED NOT DETECTED Final   Influenza A NOT DETECTED NOT DETECTED Final   Influenza B NOT DETECTED NOT DETECTED Final   Parainfluenza Virus 1 NOT DETECTED NOT DETECTED Final   Parainfluenza Virus 2 NOT DETECTED NOT DETECTED Final   Parainfluenza Virus 3 NOT DETECTED NOT DETECTED Final   Parainfluenza Virus 4 NOT DETECTED NOT DETECTED Final   Respiratory Syncytial Virus NOT DETECTED NOT DETECTED  Final   Bordetella pertussis NOT DETECTED NOT DETECTED Final   Bordetella Parapertussis NOT DETECTED NOT DETECTED Final   Chlamydophila pneumoniae NOT DETECTED NOT DETECTED Final   Mycoplasma pneumoniae NOT DETECTED NOT DETECTED Final    Comment: Performed at [facility], [address], [place], [place] [address]  Expectorated Sputum Assessment w Gram Stain, Rflx to Resp Cult     Status: None   Collection Time: 08/05/24  5:00 AM   Specimen: Sputum  Result Value Ref Range Status   Specimen Description SPUTUM  Final   Special Requests NONE  Final   Sputum evaluation   Final    Sputum specimen not acceptable for testing.  Please recollect.    Ava W. 08/05/2024 0803 AJ Performed at [facility], [address] [place], [place] [address]    Report Status 08/05/2024 FINAL  Final     Discharge Instructions:   Discharge Instructions     Diet general   Complete by: As directed    Discharge instructions   Complete by: As directed    Urology (Dr. Selma) to follow up CT scan and make follow up appointment   Increase activity slowly   Complete by: As directed       Allergies as of 08/05/2024       Reactions   Cleocin [clindamycin] Rash   Penicillins Other (See Comments)   Unknown reaction in childhood   Tape Rash        Medication List     STOP taking these medications    Cabometyx  40 MG tablet Generic drug: cabozantinib    Fluticasone-Salmeterol 113-14 MCG/ACT Aepb   LORazepam  0.5 MG tablet Commonly known as: ATIVAN    methylPREDNISolone  4 MG Tbpk tablet Commonly known as: MEDROL  DOSEPAK       TAKE these medications    amLODipine  5 MG tablet Commonly known as: NORVASC  Take 7.5 mg by mouth daily before breakfast.   doxycycline  100 MG tablet Commonly known as: VIBRA -TABS Take 1 tablet (100 mg total) by mouth 2 (two) times daily.   guaiFENesin 600 MG 12 hr tablet Commonly known as: MUCINEX Take 1 tablet (600 mg total) by mouth 2  (two) times daily as needed for cough or to loosen phlegm.   levothyroxine  150 MCG tablet Commonly known as: Synthroid  Take 1 tablet (150 mcg total) by mouth daily before breakfast.  montelukast  10 MG tablet Commonly known as: SINGULAIR  Take 10 mg by mouth daily before breakfast.   ondansetron  8 MG tablet Commonly known as: Zofran  Take 1 tablet (8 mg total) by mouth every 8 (eight) hours as needed for nausea or vomiting.   predniSONE  10 MG tablet Commonly known as: DELTASONE  50 mg x 3 days then 40mg  x 3 days then 30mg  x 3 days then 20mg  x 3 days then 10mg  x3 days then stop   tadalafil 20 MG tablet Commonly known as: CIALIS Take 10-20 mg by mouth daily as needed for erectile dysfunction.        Follow-up Information     [name], MD Follow up in 1 week(s).   Specialty: Internal Medicine Contact information: [address] [place] [address] [phone]         Selma Donnice SAUNDERS, MD Follow up.   Specialty: Urology Why: call if you do not hear from in next  few days Contact information: [address] [place] [address] [phone]                  Time coordinating discharge: 45 minutes  Signed:  Harlene RAYMOND Bowl DO  [facility] 08/05/2024, 2:05 PM

## 2024-08-05 NOTE — Discharge Instructions (Signed)
 See attached information about doxy  (your antibiotic) and the sun!

## 2024-08-05 NOTE — Progress Notes (Signed)
   08/05/24 1531  TOC Brief Assessment  Insurance and Status Reviewed  Patient has primary care physician Yes  Home environment has been reviewed [name], MD  Prior level of function: Independent  Prior/Current Home Services No current home services  Social Drivers of Health Review SDOH reviewed no interventions necessary  Readmission risk has been reviewed Yes  Transition of care needs no transition of care needs at this time

## 2024-08-05 NOTE — Progress Notes (Signed)
 Notified by lab that previously obtained sputum sample is unacceptable due to insufficient quantity. Will attempt to collect new sample.

## 2024-08-05 NOTE — Consult Note (Signed)
 Urology Consult Note   Requesting Attending Physician:  Juvenal Harlene PENNER, DO Service Providing Consult: Urology  Consulting Attending: Dr. Selma   Reason for Consult:  radiology suggestion  HPI: Michael [name] is seen in consultation for reasons noted above at the request of Vann, Jessica U, DO. Patient is a 58 y.o. male admitted by Dr. Gatha on 08/03/2024 with complaints of shortness of breath.  Patient is a pleasant 58 year old male with a history of RCC-clear cell with brain, pulmonary, and bone metastasis-status post chemotherapy and radiation, resection of the cerebellar met, right nephrectomy, COPD, HTN, CKD 2, hypothyroidism.  He was suspected to have radiation pneumonitis.  [place] lines [place] and has not had a needs to reestablish with the urologist since returning home to [place] .  CT during this hospitalization identified a small focal dilation of unclear significance, measuring 8 mm in the left ureter.  Renal function is preserved and there is no hydronephrosis.  [facility] was consulted to speak to the CT findings.   On my arrival patient was alert, oriented, in no distress up in chair.  He was a jovial gentleman whose outward appearance gave no indication of his medical complexity.  We discussed the imaging and the need to follow-up on it but there was no indication for urgent intervention at this time.  All questions were answered.  Dr. Fleeta joined us  towards the end.  Case and plan was reviewed with all parties.  ------------------  Assessment:   58 y.o. 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   Recommendations: # Left ureteral dilation There is a small focal dilation of approximately 8 mm around the mid ureter of unclear significance.  This was noted to be a soft tissue  density that was new from prior imaging.  Thankfully patient is urinating without difficulty and has good output, no pain, no hematuria, no hydronephrosis, with preserved renal function. CT hematuria while here to further characterize this admission will follow-up with Dr. Selma outpatient to establish care.  This does not need to be a condition of his discharge.  If imaging is collected and not read when he leaves.  We will review in clinic.  If he is ready to discharge before imaging has been collected, please include this in his discharge orders.  No acute urologic needs at this time.  Urology will sign off.  Patient has been provided with our practice information and physician name.  Please call with questions  Case and plan discussed with Dr. Selma  Past Medical History: Past Medical History:  Diagnosis Date   Cancer [facility])    Renal cell carcinoma   Clear cell renal cell carcinoma (HCC)    Hypertension    Hypothyroidism    Sleep apnea    wears cpap    Past Surgical History:  Past Surgical History:  Procedure Laterality Date   APPLICATION OF CRANIAL NAVIGATION N/A 10/13/2023   Procedure: APPLICATION OF CRANIAL NAVIGATION;  Surgeon: Lanis Pupa, MD;  Location: [facility] OR;  Service: Neurosurgery;  Laterality: N/A;   CRANIOTOMY N/A 10/13/2023   Procedure: SUBOCCIPITAL   CRANIOTOMY TUMOR EXCISION;  Surgeon: Lanis Pupa, MD;  Location: [facility] OR;  Service: Neurosurgery;  Laterality: N/A;   IR RADIOLOGIST EVAL & MGMT  04/04/2021   no procedures performed   IR RADIOLOGIST EVAL &  MGMT  05/08/2021   no procedures performed   IR RADIOLOGIST EVAL & MGMT  08/12/2022   IR RADIOLOGIST EVAL & MGMT  05/24/2024   LUNG BIOPSY  2021   RADIOLOGY WITH ANESTHESIA N/A 05/30/2021   Procedure: CT WITH ANESTHESIA CRYOABLATION;  Surgeon: Luverne Aran, MD;  Location: [facility] ORS;  Service: Radiology;  Laterality: N/A;   right nephrectomy Right 08/2019   TONSILLECTOMY      Medication: Current  Facility-Administered Medications  Medication Dose Route Frequency Provider Last Rate Last Admin   acetaminophen  (TYLENOL ) tablet 1,000 mg  1,000 mg Oral Q6H PRN Segars, Dorn, MD       albuterol (PROVENTIL) (2.5 MG/3ML) 0.083% nebulizer solution 2.5 mg  2.5 mg Nebulization Q4H PRN Segars, Jonathan, MD       amLODipine  (NORVASC ) tablet 7.5 mg  7.5 mg Oral QAC breakfast Segars, Jonathan, MD   7.5 mg at 08/05/24 0732   azithromycin (ZITHROMAX) tablet 500 mg  500 mg Oral Daily Segars, Jonathan, MD   500 mg at 08/04/24 1151   cefTRIAXone  (ROCEPHIN ) 1 g in sodium chloride  0.9 % 100 mL IVPB  1 g Intravenous Q24H Keturah Dorn, MD   Stopped at 08/04/24 1225   enoxaparin (LOVENOX) injection 60 mg  60 mg Subcutaneous Q24H Segars, Dorn, MD       fluticasone furoate-vilanterol (BREO ELLIPTA) 100-25 MCG/ACT 1 puff  1 puff Inhalation Daily Bell, Michelle T, RPH       guaiFENesin (MUCINEX) 12 hr tablet 600 mg  600 mg Oral BID Segars, Jonathan, MD       ipratropium-albuterol (DUONEB) 0.5-2.5 (3) MG/3ML nebulizer solution 3 mL  3 mL Nebulization BID Vann, Jessica U, DO   3 mL at 08/05/24 0907   levothyroxine  (SYNTHROID ) tablet 150 mcg  150 mcg Oral Q0600 Segars, Jonathan, MD   150 mcg at 08/05/24 0530   melatonin tablet 6 mg  6 mg Oral QHS PRN Segars, Jonathan, MD       methylPREDNISolone  sodium succinate (SOLU-MEDROL ) 125 mg/2 mL injection 125 mg  125 mg Intravenous Daily Vann, Jessica U, DO   125 mg at 08/05/24 0944   montelukast  (SINGULAIR ) tablet 10 mg  10 mg Oral QAC breakfast Segars, Jonathan, MD   10 mg at 08/05/24 0732   ondansetron  (ZOFRAN ) injection 4 mg  4 mg Intravenous Q6H PRN Segars, Dorn, MD       polyethylene glycol (MIRALAX / GLYCOLAX) packet 17 g  17 g Oral Daily PRN Keturah Dorn, MD       sodium chloride  flush (NS) 0.9 % injection 3 mL  3 mL Intravenous Q12H Segars, Jonathan, MD   3 mL at 08/05/24 0958    Allergies: Allergies  Allergen Reactions   Cleocin [Clindamycin]  Rash   Penicillins Other (See Comments)    Unknown reaction in childhood   Tape Rash    Social History: Social History   Tobacco Use   Smoking status: Never   Smokeless tobacco: Never  Vaping Use   Vaping status: Never Used  Substance Use Topics   Alcohol  use: Not Currently    Comment: occasional beer or wine 1 x a month   Drug use: Never    Family History Family History  Problem Relation Age of Onset   Liver disease Neg Hx    Colon cancer Neg Hx    Esophageal cancer Neg Hx     Review of Systems  Genitourinary:  Negative for dysuria, flank pain, frequency, hematuria and urgency.  Objective   Vital signs in last 24 hours: BP 120/74 (BP Location: Left Arm)   Pulse 70   Temp (!) 97.5 F (36.4 C) (Oral)   Resp 18   Ht 5' 6 (1.676 m)   Wt 109.6 kg   SpO2 95%   BMI 39.00 kg/m   Physical Exam General: A&O, resting, appropriate HEENT: [place]/AT Pulmonary: Normal work of breathing Cardiovascular: no cyanosis    Most Recent Labs: Lab Results  Component Value Date   WBC 10.0 08/04/2024   HGB 12.0 (L) 08/04/2024   HCT 38.5 (L) 08/04/2024   PLT 361 08/04/2024    Lab Results  Component Value Date   NA 139 08/04/2024   K 4.5 08/04/2024   CL 106 08/04/2024   CO2 19 (L) 08/04/2024   BUN 18 08/04/2024   CREATININE 1.13 08/04/2024   CALCIUM 9.3 08/04/2024   MG 2.8 (H) 08/04/2024   PHOS 2.2 (L) 08/04/2024    Lab Results  Component Value Date   INR 0.9 05/30/2021     Urine Culture: @[id number] (laburin,org,[id number],[id number])@   IMAGING: DG Chest 2 View Result Date: 08/03/2024 CLINICAL DATA:  Cough, dyspnea.  History of renal cell carcinoma. EXAM: CHEST - 2 VIEW COMPARISON:  July 13, 2020.  July 30, 2024. FINDINGS: Stable cardiomediastinal silhouette. Bilateral lower lobe opacities are noted concerning for pneumonia or atelectasis. Continued right upper lobe opacity is noted concerning for pneumonia or radiation pneumonitis as noted on prior CT scan.  Bony thorax is unremarkable. IMPRESSION: Bilateral lower lobe opacities are noted concerning for pneumonia or atelectasis. Continued right upper lobe opacity is noted concerning for pneumonia or radiation pneumonitis as noted on prior CT scan. Electronically Signed   By: Lynwood Landy Raddle M.D.   On: 08/03/2024 11:18    ------  Ole Bourdon, NP Pager: [phone]   Please contact the urology consult pager with any further questions/concerns.

## 2024-08-08 LAB — CULTURE, BLOOD (ROUTINE X 2)
Culture: NO GROWTH
Culture: NO GROWTH
Special Requests: ADEQUATE
Special Requests: ADEQUATE

## 2024-08-10 ENCOUNTER — Encounter: Payer: Self-pay | Admitting: Physician Assistant

## 2024-08-10 ENCOUNTER — Encounter: Payer: Self-pay | Admitting: Internal Medicine

## 2024-08-10 DIAGNOSIS — G4733 Obstructive sleep apnea (adult) (pediatric): Secondary | ICD-10-CM | POA: Diagnosis not present

## 2024-08-16 ENCOUNTER — Inpatient Hospital Stay

## 2024-08-16 ENCOUNTER — Telehealth: Payer: Self-pay

## 2024-08-16 ENCOUNTER — Inpatient Hospital Stay: Attending: Oncology

## 2024-08-16 ENCOUNTER — Inpatient Hospital Stay: Admitting: Internal Medicine

## 2024-08-16 DIAGNOSIS — K429 Umbilical hernia without obstruction or gangrene: Secondary | ICD-10-CM | POA: Insufficient documentation

## 2024-08-16 DIAGNOSIS — Z79890 Hormone replacement therapy (postmenopausal): Secondary | ICD-10-CM | POA: Insufficient documentation

## 2024-08-16 DIAGNOSIS — Z79620 Long term (current) use of immunosuppressive biologic: Secondary | ICD-10-CM | POA: Insufficient documentation

## 2024-08-16 DIAGNOSIS — G939 Disorder of brain, unspecified: Secondary | ICD-10-CM | POA: Insufficient documentation

## 2024-08-16 DIAGNOSIS — K5900 Constipation, unspecified: Secondary | ICD-10-CM | POA: Insufficient documentation

## 2024-08-16 DIAGNOSIS — R0602 Shortness of breath: Secondary | ICD-10-CM | POA: Insufficient documentation

## 2024-08-16 DIAGNOSIS — R7989 Other specified abnormal findings of blood chemistry: Secondary | ICD-10-CM | POA: Insufficient documentation

## 2024-08-16 DIAGNOSIS — C641 Malignant neoplasm of right kidney, except renal pelvis: Secondary | ICD-10-CM | POA: Insufficient documentation

## 2024-08-16 DIAGNOSIS — Z880 Allergy status to penicillin: Secondary | ICD-10-CM | POA: Insufficient documentation

## 2024-08-16 DIAGNOSIS — Z5112 Encounter for antineoplastic immunotherapy: Secondary | ICD-10-CM | POA: Insufficient documentation

## 2024-08-16 DIAGNOSIS — I10 Essential (primary) hypertension: Secondary | ICD-10-CM | POA: Insufficient documentation

## 2024-08-16 DIAGNOSIS — E039 Hypothyroidism, unspecified: Secondary | ICD-10-CM | POA: Insufficient documentation

## 2024-08-16 DIAGNOSIS — Z881 Allergy status to other antibiotic agents status: Secondary | ICD-10-CM | POA: Insufficient documentation

## 2024-08-16 DIAGNOSIS — Z79899 Other long term (current) drug therapy: Secondary | ICD-10-CM | POA: Insufficient documentation

## 2024-08-16 DIAGNOSIS — K7689 Other specified diseases of liver: Secondary | ICD-10-CM | POA: Insufficient documentation

## 2024-08-16 DIAGNOSIS — Z923 Personal history of irradiation: Secondary | ICD-10-CM | POA: Insufficient documentation

## 2024-08-16 DIAGNOSIS — Z9226 Personal history of immune checkpoint inhibitor therapy: Secondary | ICD-10-CM | POA: Insufficient documentation

## 2024-08-16 DIAGNOSIS — Z905 Acquired absence of kidney: Secondary | ICD-10-CM | POA: Insufficient documentation

## 2024-08-16 DIAGNOSIS — C7800 Secondary malignant neoplasm of unspecified lung: Secondary | ICD-10-CM | POA: Insufficient documentation

## 2024-08-16 DIAGNOSIS — G4730 Sleep apnea, unspecified: Secondary | ICD-10-CM | POA: Insufficient documentation

## 2024-08-16 DIAGNOSIS — C7931 Secondary malignant neoplasm of brain: Secondary | ICD-10-CM | POA: Insufficient documentation

## 2024-08-16 DIAGNOSIS — Z91048 Other nonmedicinal substance allergy status: Secondary | ICD-10-CM | POA: Insufficient documentation

## 2024-08-16 DIAGNOSIS — K754 Autoimmune hepatitis: Secondary | ICD-10-CM | POA: Insufficient documentation

## 2024-08-16 NOTE — Telephone Encounter (Signed)
 I called patient  because he is 2 hours late for his appointments. The phone call went straight to Voicemail, I left as voicemail asking for him to call back.

## 2024-08-20 ENCOUNTER — Other Ambulatory Visit: Payer: Self-pay | Admitting: Physician Assistant

## 2024-08-20 DIAGNOSIS — C649 Malignant neoplasm of unspecified kidney, except renal pelvis: Secondary | ICD-10-CM

## 2024-08-21 NOTE — Progress Notes (Unsigned)
 [facility] OFFICE PROGRESS NOTE  [name], MD [address] [place] [address]  DIAGNOSIS: Stage IV clear-cell renal cell carcinoma initially diagnosed as locally advanced disease in 2020 with evidence of pulmonary metastasis in 2021.   PRIOR THERAPY: 1) status post right nephrectomy on August 27, 2019 in [place]  and the final pathology showed clear-cell renal cell carcinoma nuclear grade 3 with 10% necrosis and negative margin with the final pathologic stage of T2a, NX. 2) status post treatment with Keytruda  and axitinib  started March 2021 for 2 cycles interrupted because of autoimmune hepatitis.  3) axitinib  5 mg daily started July 05, 2020 discontinued after 1 week because of elevated LFTs. 4) status post cryoablation of left renal mass on May 30, 2021. 5) Keytruda  200 Mg IV every 3 weeks started July 27, 2021 and then switch it to 400 Mg every 6 weeks on November 09, 2021.  Last dose was given July 2024. 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 Lanis 7) 2) SBRT to the right chest wall/sternum and T9 under the care of Dr. Patrcia starting next week. Last dose on 01/16/24  CURRENT THERAPY: 1) Opdivo  480 mg IV every 4 weeks and Cabometyx  40 mg p.o. daily first dose on 12/30/23. Status post 6 cycle of treatment.  Cabometyx  has been on hold  due to elevated LFTs.  2) Xgeva  once the patient obtains dental clearance.   INTERVAL HISTORY: Zamire Whitehurst 58 y.o. male returns  to the clinic today for a follow up visit accompanied by his wife. The patient was last seen by Dr. Sherrod on 07/19/24.   In the interval since last being seen, he was hospitalized from 9/30-10/2 for acute respiratory failure. He was placed on high dose prednisone  taper should this be ***. Dr. Sherrod also recommended cancelling the immunotherapy that was scheduled for today. He  was also treated with antibiotics. His scan showed some small focal soft tissue dilatation of the left ureter without obstruction. The patient is scheduled to see Dr. PIERRETTE from urology on ***.   Since being discharged, he is feeling ***. No fevers, chills, night sweats, changes in appetite, weight loss, mouth sores, rashes, breathing difficulties, cough, hemoptysis, diarrhea, constipation, or vomiting. Weight ***.   He is here for evaluation and repeat blood work.        MEDICAL HISTORY: Past Medical History:  Diagnosis Date   Cancer [facility])    Renal cell carcinoma   Clear cell renal cell carcinoma (HCC)    Hypertension    Hypothyroidism    Sleep apnea    wears cpap    ALLERGIES:  is allergic to cleocin [clindamycin], penicillins, and tape.  MEDICATIONS:  Current Outpatient Medications  Medication Sig Dispense Refill   amLODipine  (NORVASC ) 5 MG tablet Take 7.5 mg by mouth daily before breakfast.     doxycycline  (VIBRA -TABS) 100 MG tablet Take 1 tablet (100 mg total) by mouth 2 (two) times daily. 14 tablet 0   guaiFENesin (MUCINEX) 600 MG 12 hr tablet Take 1 tablet (600 mg total) by mouth 2 (two) times daily as needed for cough or to loosen phlegm.     levothyroxine  (SYNTHROID ) 150 MCG tablet Take 1 tablet (150 mcg total) by mouth daily before breakfast. 30 tablet 2   montelukast  (SINGULAIR ) 10 MG tablet Take 10 mg by mouth daily before breakfast.     ondansetron  (  ZOFRAN ) 8 MG tablet Take 1 tablet (8 mg total) by mouth every 8 (eight) hours as needed for nausea or vomiting. (Patient not taking: Reported on 04/27/2024) 30 tablet 1   predniSONE  (DELTASONE ) 10 MG tablet 50 mg x 3 days then 40mg  x 3 days then 30mg  x 3 days then 20mg  x 3 days then 10mg  x3 days then stop 45 tablet 0   tadalafil (CIALIS) 20 MG tablet Take 10-20 mg by mouth daily as needed for erectile dysfunction.     No current facility-administered medications for this visit.    SURGICAL HISTORY:  Past Surgical History:   Procedure Laterality Date   APPLICATION OF CRANIAL NAVIGATION N/A 10/13/2023   Procedure: APPLICATION OF CRANIAL NAVIGATION;  Surgeon: Lanis Pupa, MD;  Location: [facility] OR;  Service: Neurosurgery;  Laterality: N/A;   CRANIOTOMY N/A 10/13/2023   Procedure: SUBOCCIPITAL   CRANIOTOMY TUMOR EXCISION;  Surgeon: Lanis Pupa, MD;  Location: [facility] OR;  Service: Neurosurgery;  Laterality: N/A;   IR RADIOLOGIST EVAL & MGMT  04/04/2021   no procedures performed   IR RADIOLOGIST EVAL & MGMT  05/08/2021   no procedures performed   IR RADIOLOGIST EVAL & MGMT  08/12/2022   IR RADIOLOGIST EVAL & MGMT  05/24/2024   LUNG BIOPSY  2021   RADIOLOGY WITH ANESTHESIA N/A 05/30/2021   Procedure: CT WITH ANESTHESIA CRYOABLATION;  Surgeon: Luverne Aran, MD;  Location: [facility] ORS;  Service: Radiology;  Laterality: N/A;   right nephrectomy Right 08/2019   TONSILLECTOMY      REVIEW OF SYSTEMS:   Review of Systems  Constitutional: Negative for appetite change, chills, fatigue, fever and unexpected weight change.  HENT:   Negative for mouth sores, nosebleeds, sore throat and trouble swallowing.   Eyes: Negative for eye problems and icterus.  Respiratory: Negative for cough, hemoptysis, shortness of breath and wheezing.   Cardiovascular: Negative for chest pain and leg swelling.  Gastrointestinal: Negative for abdominal pain, constipation, diarrhea, nausea and vomiting.  Genitourinary: Negative for bladder incontinence, difficulty urinating, dysuria, frequency and hematuria.   Musculoskeletal: Negative for back pain, gait problem, neck pain and neck stiffness.  Skin: Negative for itching and rash.  Neurological: Negative for dizziness, extremity weakness, gait problem, headaches, light-headedness and seizures.  Hematological: Negative for adenopathy. Does not bruise/bleed easily.  Psychiatric/Behavioral: Negative for confusion, depression and sleep disturbance. The patient is not nervous/anxious.     PHYSICAL  EXAMINATION:  There were no vitals taken for this visit.  ECOG PERFORMANCE STATUS: {CHL ONC ECOG [id number]  Physical Exam  Constitutional: Oriented to person, place, and time and well-developed, well-nourished, and in no distress. No distress.  HENT:  Head: Normocephalic and atraumatic.  Mouth/Throat: Oropharynx is clear and moist. No oropharyngeal exudate.  Eyes: Conjunctivae are normal. Right eye exhibits no discharge. Left eye exhibits no discharge. No scleral icterus.  Neck: Normal range of motion. Neck supple.  Cardiovascular: Normal rate, regular rhythm, normal heart sounds and intact distal pulses.   Pulmonary/Chest: Effort normal and breath sounds normal. No respiratory distress. No wheezes. No rales.  Abdominal: Soft. Bowel sounds are normal. Exhibits no distension and no mass. There is no tenderness.  Musculoskeletal: Normal range of motion. Exhibits no edema.  Lymphadenopathy:    No cervical adenopathy.  Neurological: Alert and oriented to person, place, and time. Exhibits normal muscle tone. Gait normal. Coordination normal.  Skin: Skin is warm and dry. No rash noted. Not diaphoretic. No erythema. No pallor.  Psychiatric: Mood, memory and judgment normal.  Vitals reviewed.  LABORATORY DATA: Lab Results  Component Value Date   WBC 10.0 08/04/2024   HGB 12.0 (L) 08/04/2024   HCT 38.5 (L) 08/04/2024   MCV 81.1 08/04/2024   PLT 361 08/04/2024      Chemistry      Component Value Date/Time   NA 139 08/04/2024 0541   K 4.5 08/04/2024 0541   CL 106 08/04/2024 0541   CO2 19 (L) 08/04/2024 0541   BUN 18 08/04/2024 0541   CREATININE 1.13 08/04/2024 0541   CREATININE 1.30 (H) 07/19/2024 0806      Component Value Date/Time   CALCIUM 9.3 08/04/2024 0541   ALKPHOS 144 (H) 08/03/2024 1046   AST 37 08/03/2024 1046   AST 22 07/19/2024 0806   ALT 46 (H) 08/03/2024 1046   ALT 25 07/19/2024 0806   BILITOT 0.5 08/03/2024 1046   BILITOT 0.5 07/19/2024 0806        RADIOGRAPHIC STUDIES:  CT HEMATURIA WORKUP Result Date: 08/05/2024 CLINICAL DATA:  [id number] Metastasis from malignant neoplasm of right kidney (HCC) [id number]. * Tracking Code: BO * EXAM: CT ABDOMEN AND PELVIS WITHOUT AND WITH CONTRAST TECHNIQUE: Multidetector CT imaging of the abdomen and pelvis was performed following the standard protocol before and following the bolus administration of intravenous contrast. RADIATION DOSE REDUCTION: This exam was performed according to the departmental dose-optimization program which includes automated exposure control, adjustment of the mA and/or kV according to patient size and/or use of iterative reconstruction technique. CONTRAST:  OMNIPAQUE  IOHEXOL  300 MG/ML  SOLN COMPARISON:  CT scan chest, abdomen and pelvis from 07/30/2024. FINDINGS: Lower chest: Redemonstration of heterogeneous opacities with air bronchogram in the bilateral lower lobes, essentially similar to the prior study. No pleural effusion. Normal heart size. No pericardial effusion. Hepatobiliary: The liver is normal in size. Non-cirrhotic configuration. No suspicious mass. Redemonstration of multiple simple cysts throughout the liver with largest measuring up to 2.8 x 3.5 cm. No intrahepatic or extrahepatic bile duct dilation. No calcified gallstones. Normal gallbladder wall thickness. No pericholecystic inflammatory changes. Pancreas: Unremarkable. No pancreatic ductal dilatation or surrounding inflammatory changes. Spleen: Within normal limits. No focal lesion. Adrenals/Urinary Tract: Adrenal glands are unremarkable. Non-contrast images: No radiopaque urinary tract calculi. Kidneys: Symmetric enhancement. Surgically absent right kidney. No local recurrent tumor noted in the right nephrectomy bed. Redemonstration of partially exophytic approximately 1.5 x 1.6 cm lesion arising from the left kidney interpolar region, laterally. The lesion exhibit thin slightly hyperattenuating walls and central  macroscopic fat attenuation. This is incompletely characterized but appears unchanged since the prior PET-CT scan and was not FDG avid, favoring benign etiology. Urinary Tract Opacification: Adequate. Collecting Systems and Ureters: No filling defects, masses, strictures, or areas of abnormal dilatation. The apparent Focus of soft tissue dilation of the left ureter described on the prior CT scan corresponds to focally dilated/peristaltic left ureter. No focal mass seen. Urinary Bladder: No filling defect, wall thickening, or mass. Stomach/Bowel: No disproportionate dilation of the small or large bowel loops. No evidence of abnormal bowel wall thickening or inflammatory changes. The appendix is unremarkable. Vascular/Lymphatic: No ascites or pneumoperitoneum. No abdominal or pelvic lymphadenopathy, by size criteria. No aneurysmal dilation of the major abdominal arteries. Reproductive: Normal size prostate. Symmetric seminal vesicles. Other: There is a tiny fat containing umbilical hernia. The soft tissues and abdominal wall are otherwise unremarkable. Musculoskeletal: Redemonstration of lytic lesion in the anterior aspect of T8 vertebral body without interval change. There are mild - moderate multilevel degenerative changes in the  visualized spine. IMPRESSION: 1. No metastatic disease identified within the abdomen or pelvis. 2. Multiple other nonacute observations, as described above. Electronically Signed   By: Ree Molt M.D.   On: 08/05/2024 15:24   DG Chest 2 View Result Date: 08/03/2024 CLINICAL DATA:  Cough, dyspnea.  History of renal cell carcinoma. EXAM: CHEST - 2 VIEW COMPARISON:  July 13, 2020.  July 30, 2024. FINDINGS: Stable cardiomediastinal silhouette. Bilateral lower lobe opacities are noted concerning for pneumonia or atelectasis. Continued right upper lobe opacity is noted concerning for pneumonia or radiation pneumonitis as noted on prior CT scan. Bony thorax is unremarkable.  IMPRESSION: Bilateral lower lobe opacities are noted concerning for pneumonia or atelectasis. Continued right upper lobe opacity is noted concerning for pneumonia or radiation pneumonitis as noted on prior CT scan. Electronically Signed   By: Lynwood Landy Raddle M.D.   On: 08/03/2024 11:18   CT CHEST ABDOMEN PELVIS WO CONTRAST Result Date: 07/30/2024 CLINICAL DATA:  Renal cell carcinoma. Status post radiation therapy 2 thoracic spine lesion and RIGHT manubrial lesion * Tracking Code: BO * EXAM: CT CHEST, ABDOMEN AND PELVIS WITHOUT CONTRAST TECHNIQUE: Multidetector CT imaging of the chest, abdomen and pelvis was performed following the standard protocol without IV contrast. RADIATION DOSE REDUCTION: This exam was performed according to the departmental dose-optimization program which includes automated exposure control, adjustment of the mA and/or kV according to patient size and/or use of iterative reconstruction technique. COMPARISON:  04/29/2024 FINDINGS: CT CHEST FINDINGS Cardiovascular: No significant vascular findings. Normal heart size. No pericardial effusion. Mediastinum/Nodes: No axillary or supraclavicular adenopathy. No mediastinal or hilar adenopathy. No pericardial fluid. Esophagus normal. Lungs/Pleura: There is new dense airspace consolidation in the RIGHT upper lobe and new dense airspace consolidation in the RIGHT and LEFT lower lobe. These dense airspace consolidation patterns are in the vicinity of the RIGHT manubrial lesion and the T9 vertebral body level lesion. Musculoskeletal: Lytic lesion in the RIGHT aspect of the manubrium measures 5.5 by 2.7 cm compared to 6.3 x 4.0 cm. Lesion at the T9 vertebral body with anterior soft tissue expansion measures 2.7 by 1.8 cm (image 43/series 2) compared to 2.9 x 2.3 cm. No new lytic lesions identified in the thoracic skeleton CT ABDOMEN AND PELVIS FINDINGS Hepatobiliary: Multiple hypodense lesions in the liver again demonstrated. These lesions have simple  fluid attenuation. No change in size. No new lesions. Noncontrast exam noted. Pancreas: Pancreas is normal. No ductal dilatation. No pancreatic inflammation. Spleen: Normal spleen Adrenals/urinary tract: Adrenal glands normal. Post RIGHT nephrectomy. No nodularity in nephrectomy bed. LEFT kidney appears normal on noncontrast exam. Small exophytic lesion the mid LEFT kidney measuring 12 mm is unchanged (image 68/2) The LEFT ureter is decompressed through the majority of its course. There is 1 focus of focal dilatation measuring 8 mm on image 100/2. This focal dilatation is soft tissue density and is new from prior. There is an adjacent small retroperitoneal rounded body measuring 3 mm which is unchanged. Bladder normal. Stomach/Bowel: Stomach, small bowel, appendix, and cecum are normal. The colon and rectosigmoid colon are normal. Vascular/Lymphatic: Abdominal aorta is normal caliber. There is no retroperitoneal or periportal lymphadenopathy. No pelvic lymphadenopathy. Reproductive: Prostate unremarkable Other: No free fluid. Musculoskeletal: No aggressive osseous lesion. IMPRESSION: CHEST: 1. New dense airspace consolidation in the RIGHT upper lobe and RIGHT and LEFT lower lobes adjacent to osseous radiation treatment sites. Findings most consistent with significant progressive radiation pneumonitis. 2. No mediastinal adenopathy. 3. Interval decrease in size of lytic  lesion in the RIGHT aspect of the manubrium. 4. Interval decrease in size of lytic lesion at the T9 vertebral body. PELVIS: 1. No evidence of local recurrence in the RIGHT nephrectomy bed. 2. New small focal soft tissue dilatation of the LEFT ureter without obstruction. Recommend CT urogram with contrast or MRI urogram without contrast if patient cannot receive IV contrast. Alternatively consider cystoscopy and ureteroscopy for evaluation depending on clinical concern. These results will be called to the ordering clinician or representative by the  Radiologist Assistant, and communication documented in the PACS or [employer]. Electronically Signed   By: Jackquline Boxer M.D.   On: 07/30/2024 10:54     ASSESSMENT/PLAN:  This is a very pleasant 58 year old male with stage IV clear-cell renal cell carcinoma. This was initially diagnosed with locally advanced disease in 2020. He had evidence of pulmonary metastases in 2021. He is status post right nephrectomy on 10/23/202020 in [place] . The final pathology showed clear-cell renal cell carcinoma nuclear grade 3 with 10% necrosis and negative margins. The final pathologic stage was T2a, NX.    The patient started treatment with Keytruda  and Axitinib  that was started in March 2021.  He had 2 cycles of treatment but it was interrupted due to autoimmune hepatitis.  He then started on 5 mg daily of axitinib  on 07/05/2020 but this was also discontinued after 1 week due to elevated LFTs.   He underwent cryoablation to the left renal mass on 05/30/2021.   He then underwent treatment with Keytruda .  He was initially on 200 mg IV every 3 weeks on 07/27/2021 but then was switched to 400 mg every 6 weeks starting from 11/09/2021.   He was found to have a solitary right cerebellar brain lesion in November 2024 and underwent SRS on 10/10/2023 followed by craniotomy on 10/13/2023.   He was recently found to have disease progression and February 2024 with a lytic lesion in the upper sternum.  The patient is planning to to this lesion under the care of Dr. Patrcia as well as the T9 lesion.  The last day radiation was on 01/16/24   The patient was started back on treatment and he is status post his first cycle of IV nivolumab  480 mg every 4 weeks in addition to his oral treatment with Cabometyx  40 mg p.o. daily.    Patient had significantly elevated LFTs cabo this was discontinued/on hold.   He was recently hospitalized and is on a prednisone  taper. He is currently *** mg and his breathing is *** at this time.    The patient was seen with Dr. Sherrod who recommends ***.   I will arrange for ***  Cystoscopy ***   He will receive Xgeva  ***     We will see him back with labs and follow up visti in ~***weeks.   The patient was advised to call immediately if she has any concerning symptoms in the interval. The patient voices understanding of current disease status and treatment options and is in agreement with the current care plan. All questions were answered. The patient knows to call the clinic with any problems, questions or concerns. We can certainly see the patient much sooner if necessary    No orders of the defined types were placed in this encounter.    I spent {CHL ONC TIME VISIT - DTPQU:[id number]} counseling the patient face to face. The total time spent in the appointment was {CHL ONC TIME VISIT - DTPQU:[id number]}.  Motty Borin L Alfons Sulkowski, PA-C 08/21/24

## 2024-08-23 ENCOUNTER — Other Ambulatory Visit: Payer: Self-pay | Admitting: Internal Medicine

## 2024-08-23 ENCOUNTER — Inpatient Hospital Stay: Admitting: Physician Assistant

## 2024-08-23 ENCOUNTER — Inpatient Hospital Stay

## 2024-08-23 VITALS — BP 132/94 | HR 89 | Temp 97.7°F | Resp 20 | Wt 247.2 lb

## 2024-08-23 DIAGNOSIS — C649 Malignant neoplasm of unspecified kidney, except renal pelvis: Secondary | ICD-10-CM

## 2024-08-23 DIAGNOSIS — Z79899 Other long term (current) drug therapy: Secondary | ICD-10-CM | POA: Diagnosis not present

## 2024-08-23 DIAGNOSIS — R7989 Other specified abnormal findings of blood chemistry: Secondary | ICD-10-CM | POA: Diagnosis not present

## 2024-08-23 DIAGNOSIS — C7931 Secondary malignant neoplasm of brain: Secondary | ICD-10-CM | POA: Diagnosis not present

## 2024-08-23 DIAGNOSIS — G4730 Sleep apnea, unspecified: Secondary | ICD-10-CM | POA: Diagnosis not present

## 2024-08-23 DIAGNOSIS — I10 Essential (primary) hypertension: Secondary | ICD-10-CM | POA: Diagnosis not present

## 2024-08-23 DIAGNOSIS — K754 Autoimmune hepatitis: Secondary | ICD-10-CM | POA: Diagnosis not present

## 2024-08-23 DIAGNOSIS — C641 Malignant neoplasm of right kidney, except renal pelvis: Secondary | ICD-10-CM | POA: Diagnosis present

## 2024-08-23 DIAGNOSIS — R0602 Shortness of breath: Secondary | ICD-10-CM | POA: Diagnosis not present

## 2024-08-23 DIAGNOSIS — K5900 Constipation, unspecified: Secondary | ICD-10-CM | POA: Diagnosis not present

## 2024-08-23 DIAGNOSIS — C7951 Secondary malignant neoplasm of bone: Secondary | ICD-10-CM

## 2024-08-23 DIAGNOSIS — Z79620 Long term (current) use of immunosuppressive biologic: Secondary | ICD-10-CM | POA: Diagnosis not present

## 2024-08-23 DIAGNOSIS — Z5112 Encounter for antineoplastic immunotherapy: Secondary | ICD-10-CM | POA: Diagnosis not present

## 2024-08-23 DIAGNOSIS — Z905 Acquired absence of kidney: Secondary | ICD-10-CM | POA: Diagnosis not present

## 2024-08-23 DIAGNOSIS — E039 Hypothyroidism, unspecified: Secondary | ICD-10-CM | POA: Diagnosis not present

## 2024-08-23 DIAGNOSIS — Z880 Allergy status to penicillin: Secondary | ICD-10-CM | POA: Diagnosis not present

## 2024-08-23 DIAGNOSIS — Z9226 Personal history of immune checkpoint inhibitor therapy: Secondary | ICD-10-CM | POA: Diagnosis not present

## 2024-08-23 DIAGNOSIS — Z881 Allergy status to other antibiotic agents status: Secondary | ICD-10-CM | POA: Diagnosis not present

## 2024-08-23 DIAGNOSIS — C7800 Secondary malignant neoplasm of unspecified lung: Secondary | ICD-10-CM | POA: Diagnosis not present

## 2024-08-23 DIAGNOSIS — G939 Disorder of brain, unspecified: Secondary | ICD-10-CM | POA: Diagnosis not present

## 2024-08-23 DIAGNOSIS — K429 Umbilical hernia without obstruction or gangrene: Secondary | ICD-10-CM | POA: Diagnosis not present

## 2024-08-23 DIAGNOSIS — Z79890 Hormone replacement therapy (postmenopausal): Secondary | ICD-10-CM | POA: Diagnosis not present

## 2024-08-23 DIAGNOSIS — Z91048 Other nonmedicinal substance allergy status: Secondary | ICD-10-CM | POA: Diagnosis not present

## 2024-08-23 DIAGNOSIS — Z923 Personal history of irradiation: Secondary | ICD-10-CM | POA: Diagnosis not present

## 2024-08-23 DIAGNOSIS — K7689 Other specified diseases of liver: Secondary | ICD-10-CM | POA: Diagnosis not present

## 2024-08-23 LAB — CMP (CANCER CENTER ONLY)
ALT: 27 U/L (ref 0–44)
AST: 18 U/L (ref 15–41)
Albumin: 3.9 g/dL (ref 3.5–5.0)
Alkaline Phosphatase: 78 U/L (ref 38–126)
Anion gap: 8 (ref 5–15)
BUN: 18 mg/dL (ref 6–20)
CO2: 26 mmol/L (ref 22–32)
Calcium: 9.3 mg/dL (ref 8.9–10.3)
Chloride: 103 mmol/L (ref 98–111)
Creatinine: 1.23 mg/dL (ref 0.61–1.24)
GFR, Estimated: >60 mL/min (ref >60)
Glucose, Bld: 119 mg/dL — ABNORMAL HIGH (ref 70–99)
Potassium: 3.7 mmol/L (ref 3.5–5.1)
Sodium: 137 mmol/L (ref 135–145)
Total Bilirubin: 0.8 mg/dL (ref 0.0–1.2)
Total Protein: 7 g/dL (ref 6.5–8.1)

## 2024-08-23 LAB — CBC WITH DIFFERENTIAL (CANCER CENTER ONLY)
Abs Immature Granulocytes: 0.03 K/uL (ref 0.00–0.07)
Basophils Absolute: 0 K/uL (ref 0.0–0.1)
Basophils Relative: 0 %
Eosinophils Absolute: 0.2 K/uL (ref 0.0–0.5)
Eosinophils Relative: 2 %
HCT: 42.4 % (ref 39.0–52.0)
Hemoglobin: 13.6 g/dL (ref 13.0–17.0)
Immature Granulocytes: 0 %
Lymphocytes Relative: 10 %
Lymphs Abs: 0.8 K/uL (ref 0.7–4.0)
MCH: 25.7 pg — ABNORMAL LOW (ref 26.0–34.0)
MCHC: 32.1 g/dL (ref 30.0–36.0)
MCV: 80.2 fL (ref 80.0–100.0)
Monocytes Absolute: 0.6 K/uL (ref 0.1–1.0)
Monocytes Relative: 8 %
Neutro Abs: 6.5 K/uL (ref 1.7–7.7)
Neutrophils Relative %: 80 %
Platelet Count: 200 K/uL (ref 150–400)
RBC: 5.29 MIL/uL (ref 4.22–5.81)
RDW: 19.5 % — ABNORMAL HIGH (ref 11.5–15.5)
WBC Count: 8.2 K/uL (ref 4.0–10.5)
nRBC: 0 % (ref 0.0–0.2)

## 2024-08-23 MED ORDER — SODIUM CHLORIDE 0.9 % IV SOLN: INTRAVENOUS | Status: DC

## 2024-08-23 MED ORDER — DENOSUMAB 120 MG/1.7ML ~~LOC~~ SOLN
120.0000 mg | Freq: Once | SUBCUTANEOUS | Status: AC
  Administered 2024-08-23: 120 mg via SUBCUTANEOUS
  Filled 2024-08-23: qty 1.7

## 2024-08-23 MED ORDER — SODIUM CHLORIDE 0.9 % IV SOLN
480.0000 mg | Freq: Once | INTRAVENOUS | Status: AC
  Administered 2024-08-23: 480 mg via INTRAVENOUS
  Filled 2024-08-23: qty 48

## 2024-08-23 NOTE — Patient Instructions (Signed)
 [facility] - A DEPT OF [facility]  Discharge Instructions: Thank you for choosing [facility] to provide your oncology and hematology care.   If you have a lab appointment with the Cancer Center, please go directly to the Cancer Center and check in at the registration area.   Wear comfortable clothing and clothing appropriate for easy access to any Portacath or PICC line.   We strive to give you quality time with your provider. You may need to reschedule your appointment if you arrive late (15 or more minutes).  Arriving late affects you and other patients whose appointments are after yours.  Also, if you miss three or more appointments without notifying the office, you may be dismissed from the clinic at the provider's discretion.      For prescription refill requests, have your pharmacy contact our office and allow 72 hours for refills to be completed.    Today you received the following chemotherapy and/or immunotherapy agents: nivolumab (OPDIVO)       To help prevent nausea and vomiting after your treatment, we encourage you to take your nausea medication as directed.  BELOW ARE SYMPTOMS THAT SHOULD BE REPORTED IMMEDIATELY: *FEVER GREATER THAN 100.4 F (38 C) OR HIGHER *CHILLS OR SWEATING *NAUSEA AND VOMITING THAT IS NOT CONTROLLED WITH YOUR NAUSEA MEDICATION *UNUSUAL SHORTNESS OF BREATH *UNUSUAL BRUISING OR BLEEDING *URINARY PROBLEMS (pain or burning when urinating, or frequent urination) *BOWEL PROBLEMS (unusual diarrhea, constipation, pain near the anus) TENDERNESS IN MOUTH AND THROAT WITH OR WITHOUT PRESENCE OF ULCERS (sore throat, sores in mouth, or a toothache) UNUSUAL RASH, SWELLING OR PAIN  UNUSUAL VAGINAL DISCHARGE OR ITCHING   Items with * indicate a potential emergency and should be followed up as soon as possible or go to the Emergency Department if any problems should occur.  Please show the CHEMOTHERAPY ALERT CARD or  IMMUNOTHERAPY ALERT CARD at check-in to the Emergency Department and triage nurse.  Should you have questions after your visit or need to cancel or reschedule your appointment, please contact [facility] - A DEPT OF [facility]  Dept: [phone]  and follow the prompts.  Office hours are 8:00 a.m. to 4:30 p.m. Monday - Friday. Please note that voicemails left after 4:00 p.m. may not be returned until the following business day.  We are closed weekends and major holidays. You have access to a nurse at all times for urgent questions. Please call the main number to the clinic Dept: [phone] and follow the prompts.   For any non-urgent questions, you may also contact your provider using MyChart. We now offer e-Visits for anyone 3 and older to request care online for non-urgent symptoms. For details visit [email or web address].   Also download the MyChart app! Go to the app store, search "MyChart", open the app, select [place], and log in with your MyChart username and password.

## 2024-08-25 ENCOUNTER — Other Ambulatory Visit: Payer: Self-pay

## 2024-08-25 DIAGNOSIS — M5126 Other intervertebral disc displacement, lumbar region: Secondary | ICD-10-CM | POA: Diagnosis not present

## 2024-08-26 ENCOUNTER — Emergency Department (HOSPITAL_COMMUNITY): Admission: EM | Admit: 2024-08-26 | Discharge: 2024-08-27 | Disposition: A | Discharge status: Home / Self Care

## 2024-08-26 ENCOUNTER — Other Ambulatory Visit: Payer: Self-pay

## 2024-08-26 ENCOUNTER — Emergency Department (HOSPITAL_COMMUNITY)

## 2024-08-26 DIAGNOSIS — R0602 Shortness of breath: Secondary | ICD-10-CM | POA: Diagnosis not present

## 2024-08-26 DIAGNOSIS — C7931 Secondary malignant neoplasm of brain: Secondary | ICD-10-CM | POA: Insufficient documentation

## 2024-08-26 DIAGNOSIS — R059 Cough, unspecified: Secondary | ICD-10-CM | POA: Diagnosis not present

## 2024-08-26 DIAGNOSIS — Z79899 Other long term (current) drug therapy: Secondary | ICD-10-CM | POA: Diagnosis not present

## 2024-08-26 DIAGNOSIS — J189 Pneumonia, unspecified organism: Secondary | ICD-10-CM | POA: Diagnosis not present

## 2024-08-26 DIAGNOSIS — I10 Essential (primary) hypertension: Secondary | ICD-10-CM | POA: Insufficient documentation

## 2024-08-26 DIAGNOSIS — C7951 Secondary malignant neoplasm of bone: Secondary | ICD-10-CM | POA: Diagnosis not present

## 2024-08-26 DIAGNOSIS — C649 Malignant neoplasm of unspecified kidney, except renal pelvis: Secondary | ICD-10-CM | POA: Insufficient documentation

## 2024-08-26 DIAGNOSIS — R509 Fever, unspecified: Secondary | ICD-10-CM

## 2024-08-26 DIAGNOSIS — R918 Other nonspecific abnormal finding of lung field: Secondary | ICD-10-CM | POA: Diagnosis not present

## 2024-08-26 LAB — I-STAT CG4 LACTIC ACID, ED: Lactic Acid, Venous: 1.5 mmol/L (ref 0.5–1.9)

## 2024-08-26 MED ORDER — ACETAMINOPHEN 500 MG PO TABS
1000.0000 mg | ORAL_TABLET | Freq: Once | ORAL | Status: AC
  Administered 2024-08-26: 1000 mg via ORAL
  Filled 2024-08-26: qty 2

## 2024-08-26 NOTE — ED Provider Triage Note (Signed)
 Emergency Medicine Provider Triage Evaluation Note  Michael [name] , a 58 y.o. male  was evaluated in triage.  Pt complains of sob. Report having fever, chills, sob, cough today.  Was seen a few weeks ago for similar and hospitalized for pna vs pneumonitis.  Report feeling better until today when he was outside playing tennis in the cold.  Remote hx of kidney cancer currently on immunotherapy.  No hx of PE/DVT  Review of Systems  Positive: As above Negative: As above  Physical Exam  BP 126/89 (BP Location: Left Arm)   Pulse (!) 104   Temp (!) 100.5 F (38.1 C) (Oral)   Resp 18   SpO2 94%  Gen:   Awake, no distress   Resp:  Normal effort  MSK:   Moves extremities without difficulty  Other:    Medical Decision Making  Medically screening exam initiated at 11:07 PM.  Appropriate orders placed.  Michael [name] was informed that the remainder of the evaluation will be completed by another provider, this initial triage assessment does not replace that evaluation, and the importance of remaining in the ED until their evaluation is complete.     Nivia Colon, PA-C 08/26/24 2308

## 2024-08-26 NOTE — ED Triage Notes (Signed)
 Patient complains of temp 101.4, coughing and short of breath. O2 sat 92% RA at home.Patient was hospitalized 3 weeks ago with Pneumonia.

## 2024-08-27 ENCOUNTER — Encounter (HOSPITAL_COMMUNITY): Payer: Self-pay

## 2024-08-27 ENCOUNTER — Emergency Department (HOSPITAL_COMMUNITY)

## 2024-08-27 DIAGNOSIS — R509 Fever, unspecified: Secondary | ICD-10-CM | POA: Diagnosis not present

## 2024-08-27 DIAGNOSIS — R059 Cough, unspecified: Secondary | ICD-10-CM | POA: Diagnosis not present

## 2024-08-27 DIAGNOSIS — J700 Acute pulmonary manifestations due to radiation: Secondary | ICD-10-CM | POA: Diagnosis not present

## 2024-08-27 LAB — CBC WITH DIFFERENTIAL/PLATELET
Abs Immature Granulocytes: 0.01 K/uL (ref 0.00–0.07)
Basophils Absolute: 0 K/uL (ref 0.0–0.1)
Basophils Relative: 0 %
Eosinophils Absolute: 0.3 K/uL (ref 0.0–0.5)
Eosinophils Relative: 4 %
HCT: 34.1 % — ABNORMAL LOW (ref 39.0–52.0)
Hemoglobin: 10.5 g/dL — ABNORMAL LOW (ref 13.0–17.0)
Immature Granulocytes: 0 %
Lymphocytes Relative: 13 %
Lymphs Abs: 0.8 K/uL (ref 0.7–4.0)
MCH: 25.9 pg — ABNORMAL LOW (ref 26.0–34.0)
MCHC: 30.8 g/dL (ref 30.0–36.0)
MCV: 84 fL (ref 80.0–100.0)
Monocytes Absolute: 0.6 K/uL (ref 0.1–1.0)
Monocytes Relative: 11 %
Neutro Abs: 4.2 K/uL (ref 1.7–7.7)
Neutrophils Relative %: 72 %
Platelets: 196 K/uL (ref 150–400)
RBC: 4.06 MIL/uL — ABNORMAL LOW (ref 4.22–5.81)
RDW: 18.6 % — ABNORMAL HIGH (ref 11.5–15.5)
WBC: 5.9 K/uL (ref 4.0–10.5)
nRBC: 0 % (ref 0.0–0.2)

## 2024-08-27 LAB — BASIC METABOLIC PANEL WITH GFR
Anion gap: 11 (ref 5–15)
BUN: 14 mg/dL (ref 6–20)
CO2: 24 mmol/L (ref 22–32)
Calcium: 9.1 mg/dL (ref 8.9–10.3)
Chloride: 100 mmol/L (ref 98–111)
Creatinine, Ser: 1.37 mg/dL — ABNORMAL HIGH (ref 0.61–1.24)
GFR, Estimated: >60 mL/min (ref >60)
Glucose, Bld: 107 mg/dL — ABNORMAL HIGH (ref 70–99)
Potassium: 4.4 mmol/L (ref 3.5–5.1)
Sodium: 136 mmol/L (ref 135–145)

## 2024-08-27 LAB — CBC
HCT: 40.1 % (ref 39.0–52.0)
Hemoglobin: 12.5 g/dL — ABNORMAL LOW (ref 13.0–17.0)
MCH: 25.9 pg — ABNORMAL LOW (ref 26.0–34.0)
MCHC: 31.2 g/dL (ref 30.0–36.0)
MCV: 83.2 fL (ref 80.0–100.0)
Platelets: 229 K/uL (ref 150–400)
RBC: 4.82 MIL/uL (ref 4.22–5.81)
RDW: 18.6 % — ABNORMAL HIGH (ref 11.5–15.5)
WBC: 8.9 K/uL (ref 4.0–10.5)
nRBC: 0 % (ref 0.0–0.2)

## 2024-08-27 LAB — RESP PANEL BY RT-PCR (RSV, FLU A&B, COVID)  RVPGX2
Influenza A by PCR: NEGATIVE
Influenza B by PCR: NEGATIVE
Resp Syncytial Virus by PCR: NEGATIVE
SARS Coronavirus 2 by RT PCR: NEGATIVE

## 2024-08-27 MED ORDER — SODIUM CHLORIDE 0.9 % IV BOLUS
1000.0000 mL | Freq: Once | INTRAVENOUS | Status: AC
  Administered 2024-08-27: 1000 mL via INTRAVENOUS

## 2024-08-27 MED ORDER — AZITHROMYCIN 250 MG PO TABS: ORAL_TABLET | ORAL | 0 refills | Status: DC

## 2024-08-27 MED ORDER — ACETAMINOPHEN 500 MG PO TABS
ORAL_TABLET | ORAL | Status: AC
  Filled 2024-08-27: qty 2

## 2024-08-27 MED ORDER — DOXYCYCLINE HYCLATE 100 MG PO CAPS: 100.0000 mg | ORAL_CAPSULE | Freq: Two times a day (BID) | ORAL | 0 refills | Status: DC

## 2024-08-27 MED ORDER — IOHEXOL 350 MG/ML SOLN
75.0000 mL | Freq: Once | INTRAVENOUS | Status: AC | PRN
  Administered 2024-08-27: 75 mL via INTRAVENOUS

## 2024-08-27 NOTE — Discharge Instructions (Addendum)
 It was a pleasure taking part in your care.  As we discussed, I am treating you for community-acquired pneumonia.  Please begin taking doxycycline  twice a day for 7 days.  Please take Z-Pak.  Take 500 mg on day 1 followed by 250 mg on day 2 through 5.  Please follow-up with your PCP as well as your oncologist, Dr. Gatha.  Please take Tylenol  for fevers at home.  Please return to the ED with any new or worsening symptoms.

## 2024-08-27 NOTE — ED Notes (Signed)
 Pt ambulated without issue. SpO2 noted at 96-98 percent throughout process. Pt denied any SOB or chest tightness while walking

## 2024-08-27 NOTE — ED Provider Notes (Signed)
 [name] EMERGENCY DEPARTMENT AT [facility] Provider Note   CSN: [id number] Arrival date & time: 08/26/24  2246     Patient presents with: No chief complaint on file.   Michael [name] is a 58 y.o. male with history of clear-cell renal carcinoma, hypertension, metastasis to brain, metastasis to bone, acute hypoxic respiratory failure, community-acquired pneumonia.  Presents to ED complaining of fever, shortness of breath.  Reports that he was recently admitted to the hospital secondary to pneumonia with hypoxia.  States that today he noticed that he had a fever at home noted by his wife.  Reports oral temperature at home was 101.4 Fahrenheit.  States that he has felt short of breath today but denies any chest pain.  Denies leg swelling, nausea, vomiting, diarrhea or abdominal pain.  Denies lightheadedness, dizziness or weakness.  Reports he is currently going through immunotherapy for cancer diagnosis.  States that he noticed on his pulse oximeter at home that his oxygen  saturation was 91% and his pulse rate was 111.  HPI     Prior to Admission medications   Medication Sig Start Date End Date Taking? Authorizing Provider  amLODipine  (NORVASC ) 5 MG tablet Take 7.5 mg by mouth daily before breakfast. 04/23/21   [provider]  azithromycin (ZITHROMAX) 250 MG tablet Take 2 tablets (500 mg total) by mouth daily for 1 day, THEN 1 tablet (250 mg total) daily for 4 days. Take first 2 tablets together, then 1 every day until finished. 08/27/24 09/01/24 Yes Ruthell Lonni FALCON, PA-C  doxycycline  (VIBRAMYCIN ) 100 MG capsule Take 1 capsule (100 mg total) by mouth 2 (two) times daily. 08/27/24   Ruthell Lonni FALCON, PA-C  guaiFENesin (MUCINEX) 600 MG 12 hr tablet Take 1 tablet (600 mg total) by mouth 2 (two) times daily as needed for cough or to loosen phlegm. 08/05/24   Vann, Jessica U, DO  levothyroxine  (SYNTHROID ) 150 MCG tablet Take 1 tablet (150 mcg total) by mouth daily before  breakfast. 07/19/24   [name], MD  montelukast  (SINGULAIR ) 10 MG tablet Take 10 mg by mouth daily before breakfast.    [provider]  ondansetron  (ZOFRAN ) 8 MG tablet Take 1 tablet (8 mg total) by mouth every 8 (eight) hours as needed for nausea or vomiting. Patient not taking: Reported on 04/27/2024 12/15/23   [name], MD  predniSONE  (DELTASONE ) 10 MG tablet 50 mg x 3 days then 40mg  x 3 days then 30mg  x 3 days then 20mg  x 3 days then 10mg  x3 days then stop 08/05/24   Vann, Jessica U, DO  tadalafil (CIALIS) 20 MG tablet Take 10-20 mg by mouth daily as needed for erectile dysfunction.    [provider]    Allergies: Cleocin [clindamycin], Penicillins, and Tape    Review of Systems  Constitutional:  Positive for fever.  Respiratory:  Positive for shortness of breath.   All other systems reviewed and are negative.   Updated Vital Signs BP 117/72   Pulse 70   Temp 98.1 F (36.7 C) (Oral)   Resp 12   SpO2 96%   Physical Exam Vitals and nursing note reviewed.  Constitutional:      General: He is not in acute distress.    Appearance: He is well-developed.  HENT:     Head: Normocephalic and atraumatic.  Eyes:     Conjunctiva/sclera: Conjunctivae normal.  Cardiovascular:     Rate and Rhythm: Normal rate and regular rhythm.     Heart sounds:  No murmur heard. Pulmonary:     Effort: Pulmonary effort is normal. No respiratory distress.     Breath sounds: Normal breath sounds. No wheezing or rales.  Abdominal:     Palpations: Abdomen is soft.     Tenderness: There is no abdominal tenderness.  Musculoskeletal:        General: No swelling.     Cervical back: Neck supple.     Right lower leg: No edema.     Left lower leg: No edema.  Skin:    General: Skin is warm and dry.     Capillary Refill: Capillary refill takes less than 2 seconds.  Neurological:     Mental Status: He is alert.  Psychiatric:        Mood and Affect: Mood normal.     (all  labs ordered are listed, but only abnormal results are displayed) Labs Reviewed  BASIC METABOLIC PANEL WITH GFR - Abnormal; Notable for the following components:      Result Value   Glucose, Bld 107 (*)    Creatinine, Ser 1.37 (*)    All other components within normal limits  CBC - Abnormal; Notable for the following components:   Hemoglobin 12.5 (*)    MCH 25.9 (*)    RDW 18.6 (*)    All other components within normal limits  CBC WITH DIFFERENTIAL/PLATELET - Abnormal; Notable for the following components:   RBC 4.06 (*)    Hemoglobin 10.5 (*)    HCT 34.1 (*)    MCH 25.9 (*)    RDW 18.6 (*)    All other components within normal limits  RESP PANEL BY RT-PCR (RSV, FLU A&B, COVID)  RVPGX2  CULTURE, BLOOD (ROUTINE X 2)  CULTURE, BLOOD (ROUTINE X 2)  I-STAT CG4 LACTIC ACID, ED    EKG: None  Radiology: CT Angio Chest PE W and/or Wo Contrast Result Date: 08/27/2024 CLINICAL DATA:  Fevers and cough, initial encounter EXAM: CT ANGIOGRAPHY CHEST WITH CONTRAST TECHNIQUE: Multidetector CT imaging of the chest was performed using the standard protocol during bolus administration of intravenous contrast. Multiplanar CT image reconstructions and MIPs were obtained to evaluate the vascular anatomy. RADIATION DOSE REDUCTION: This exam was performed according to the departmental dose-optimization program which includes automated exposure control, adjustment of the mA and/or kV according to patient size and/or use of iterative reconstruction technique. CONTRAST:  75mL OMNIPAQUE  IOHEXOL  350 MG/ML SOLN COMPARISON:  Chest x-ray from the previous day.  CT from 07/30/2024 FINDINGS: Cardiovascular: Thoracic aorta and its branches are within normal limits. Pulmonary artery shows no pulmonary emboli. No coronary calcifications are seen. No cardiac enlargement is noted. Mediastinum/Nodes: Thoracic inlet is within normal limits. No hilar or mediastinal adenopathy is noted. The esophagus as visualized is within  normal limits. Lungs/Pleura: Right-sided airspace infiltrates are identified which has an of improved somewhat in the interval from the prior CT examination. Some improved aeration in the left lower lobe is noted when compare with the prior exam although increase in left upper lobe infiltrate is seen. There are some rounded densities identified suspicious for metastatic disease. The most prominent of these measures 7 mm in the right upper lobe best seen on image number 50 of series 12. This is slightly increased from 5.5 mm on the prior exam. Upper Abdomen: Visualized upper abdomen demonstrates cysts in the liver stable in appearance from the prior exam. Musculoskeletal: The previously seen right manubrial lytic lesion is again identified relatively stable from the prior exam. No  other rib lesions are seen. Lytic lesion in the anterior aspect of the T9 vertebral body is again noted and stable. No new vertebral lytic lesions are seen. Review of the MIP images confirms the above findings. IMPRESSION: Changes consistent with bony metastatic disease stable from the prior exam. Waxing and waning infiltrates in both lungs as described above. The left upper lobe shows the most amount of worsening infiltrate. Rounded nodular densities the largest of which lies in the right upper lobe and suspicious for metastatic disease given the clinical history. No evidence of pulmonary emboli. Electronically Signed   By: Oneil Devonshire M.D.   On: 08/27/2024 03:31   DG Chest 2 View Result Date: 08/26/2024 EXAM: 2 VIEW(S) XRAY OF THE CHEST 08/26/2024 11:44:00 PM COMPARISON: 08/03/2024 CLINICAL HISTORY: shortness of breath. Per chart - Pt complains of sob. Report having fever, chills, sob, cough today. Was seen a few weeks ago for similar and hospitalized for pna vs pneumonitis. Report feeling better until today when he was outside playing tennis in the cold. Remote hx of kidney cancer currently on immunotherapy. No hx of PE/DVT.  FINDINGS: LUNGS AND PLEURA: Persistent bilateral airspace opacities. Improved aeration of right upper lobe with worsening aeration of left upper lobe. Similar airspace opacities in the bilateral lower lungs. No pulmonary edema. No pleural effusion. No pneumothorax. HEART AND MEDIASTINUM: No acute abnormality of the cardiac and mediastinal silhouettes. BONES AND SOFT TISSUES: No acute osseous abnormality. IMPRESSION: 1. Persistent bilateral airspace opacities- improved aeration of the right upper lobe and worsening aeration of the left upper lobe. Similar opacities in the bilateral lower lungs compared to 08/03/2024. Electronically signed by: Norman Gatlin MD 08/26/2024 11:57 PM EDT RP Workstation: [id number]    Procedures   Medications Ordered in the ED  acetaminophen  (TYLENOL ) tablet 1,000 mg (1,000 mg Oral Given by Other 08/26/24 2330)  sodium chloride  0.9 % bolus 1,000 mL (0 mLs Intravenous Stopped 08/27/24 0253)  iohexol  (OMNIPAQUE ) 350 MG/ML injection 75 mL (75 mLs Intravenous Contrast Given 08/27/24 0304)    Medical Decision Making Amount and/or Complexity of Data Reviewed Labs: ordered. Radiology: ordered.  Risk Prescription drug management.   This is a 58 year old male presenting to the ED due to concern of fevers and shortness of breath.  On exam, he is febrile and tachycardic.  Lung sounds are clear bilaterally, no hypoxia on room air.  Abdomen is soft and compressible.  Neuroexam at baseline.  Overall nontoxic in appearance speaking in full sentences.  Assessed with CBC, BMP, viral panel, blood cultures, lactic acid, chest x-ray, CTA, EKG.  Given 1 L of fluid, 1 g of Tylenol  for fever.  CBC without leukocytosis, no anemia.  Neutrophils not decreased, 72.  Lactic acid not elevated 1.5.  Viral panel negative for all.  Metabolic panel with creatinine 1.37, anion gap 11, GFR greater than 60.  Chest x-ray shows persistent bilateral airspace opacities-improved aeration of the right  upper lobe and worsening aeration of the left upper lobe.  Similar opacities in the bilateral lower lungs compared to 07/24/2024.  CTA obtained rule out blood clot and to further assess lungs.  CTA shows changes consistent with bony metastatic disease which is stable from the prior exam.  There is waxing and waning infiltrates in the both lungs as described above.  The left upper lobe shows amount of worsening upon today.  There are rounded nodular densities the largest which lies in the right upper lobe and suspicious for metastatic disease given clinical history.  No evidence of PE.  Patient was ambulated through department and maintained oxygen  saturation 96 to 97% room air.  He denies shortness of breath.  Patient will be discharged home at this time with cyclin, azithromycin.  Advised to follow-up with outpatient PCP as well as oncologist.  He was made aware of his results and his laboratory evaluation and imaging findings.  He was encouraged to follow-up in the morning with PCP, he reports he has appointment with PCP tomorrow at 1230.  Also advised to follow-up outpatient with his oncologist.  He was given return precautions and he voiced understanding.  Stable to discharge.   Final diagnoses:  Fever, unspecified fever cause  Shortness of breath  Community acquired pneumonia, unspecified laterality    ED Discharge Orders          Ordered    doxycycline  (VIBRAMYCIN ) 100 MG capsule  2 times daily,   Status:  Discontinued        08/27/24 0510    azithromycin (ZITHROMAX) 250 MG tablet  Daily        08/27/24 0510    doxycycline  (VIBRAMYCIN ) 100 MG capsule  2 times daily        08/27/24 0510               Ruthell Lonni FALCON, PA-C 08/27/24 0515    Theadore Ozell HERO, MD 08/27/24 [phone]

## 2024-08-27 NOTE — ED Provider Notes (Incomplete)
 [place] EMERGENCY DEPARTMENT AT [facility] Provider Note   CSN: [id number] Arrival date & time: 08/26/24  2246     Patient presents with: No chief complaint on file.   Michael [name] is a 58 y.o. male with history of clear-cell renal carcinoma, hypertension, metastasis to brain, metastasis to bone, acute hypoxic respiratory failure, community-acquired pneumonia.  Presents to ED complaining of fever, shortness of breath.  Reports that he was recently admitted to the hospital secondary to pneumonia with hypoxia.  States that today he noticed that he had a fever at home noted by his wife.  Reports oral temperature at home was 101.4 Fahrenheit.  States that he has felt short of breath today but denies any chest pain.  Denies leg swelling, nausea, vomiting, diarrhea or abdominal pain.  Denies lightheadedness, dizziness or weakness.  Reports he is currently going through immunotherapy for cancer diagnosis.  States that he noticed on his pulse oximeter at home that his oxygen  saturation was 91% and his pulse rate was 111.  HPI     Prior to Admission medications   Medication Sig Start Date End Date Taking? Authorizing Provider  amLODipine  (NORVASC ) 5 MG tablet Take 7.5 mg by mouth daily before breakfast. 04/23/21   [provider]  doxycycline  (VIBRA -TABS) 100 MG tablet Take 1 tablet (100 mg total) by mouth 2 (two) times daily. 08/05/24   Vann, Jessica U, DO  guaiFENesin (MUCINEX) 600 MG 12 hr tablet Take 1 tablet (600 mg total) by mouth 2 (two) times daily as needed for cough or to loosen phlegm. 08/05/24   Vann, Jessica U, DO  levothyroxine  (SYNTHROID ) 150 MCG tablet Take 1 tablet (150 mcg total) by mouth daily before breakfast. 07/19/24   [name], MD  montelukast  (SINGULAIR ) 10 MG tablet Take 10 mg by mouth daily before breakfast.    [provider]  ondansetron  (ZOFRAN ) 8 MG tablet Take 1 tablet (8 mg total) by mouth every 8 (eight) hours as needed for nausea  or vomiting. Patient not taking: Reported on 04/27/2024 12/15/23   [name], MD  predniSONE  (DELTASONE ) 10 MG tablet 50 mg x 3 days then 40mg  x 3 days then 30mg  x 3 days then 20mg  x 3 days then 10mg  x3 days then stop 08/05/24   Vann, Jessica U, DO  tadalafil (CIALIS) 20 MG tablet Take 10-20 mg by mouth daily as needed for erectile dysfunction.    [provider]    Allergies: Cleocin [clindamycin], Penicillins, and Tape    Review of Systems  Updated Vital Signs BP 126/89 (BP Location: Left Arm)   Pulse (!) 104   Temp (!) 100.5 F (38.1 C) (Oral)   Resp 18   SpO2 94%   Physical Exam  (all labs ordered are listed, but only abnormal results are displayed) Labs Reviewed  CULTURE, BLOOD (ROUTINE X 2)  CULTURE, BLOOD (ROUTINE X 2)  RESP PANEL BY RT-PCR (RSV, FLU A&B, COVID)  RVPGX2  BASIC METABOLIC PANEL WITH GFR  CBC  I-STAT CG4 LACTIC ACID, ED    EKG: None  Radiology: DG Chest 2 View Result Date: 08/26/2024 EXAM: 2 VIEW(S) XRAY OF THE CHEST 08/26/2024 11:44:00 PM COMPARISON: 08/03/2024 CLINICAL HISTORY: shortness of breath. Per chart - Pt complains of sob. Report having fever, chills, sob, cough today. Was seen a few weeks ago for similar and hospitalized for pna vs pneumonitis. Report feeling better until today when he was outside playing tennis in the cold. Remote hx of kidney cancer  currently on immunotherapy. No hx of PE/DVT. FINDINGS: LUNGS AND PLEURA: Persistent bilateral airspace opacities. Improved aeration of right upper lobe with worsening aeration of left upper lobe. Similar airspace opacities in the bilateral lower lungs. No pulmonary edema. No pleural effusion. No pneumothorax. HEART AND MEDIASTINUM: No acute abnormality of the cardiac and mediastinal silhouettes. BONES AND SOFT TISSUES: No acute osseous abnormality. IMPRESSION: 1. Persistent bilateral airspace opacities- improved aeration of the right upper lobe and worsening aeration of the left upper  lobe. Similar opacities in the bilateral lower lungs compared to 08/03/2024. Electronically signed by: Norman Gatlin MD 08/26/2024 11:57 PM EDT RP Workstation: [id number]    {Document cardiac monitor, telemetry assessment procedure when appropriate:32947} Procedures   Medications Ordered in the ED  acetaminophen  (TYLENOL ) tablet 1,000 mg (has no administration in time range)      {Click here for ABCD2, HEART and other calculators REFRESH Note before signing:1}                              Medical Decision Making Amount and/or Complexity of Data Reviewed Labs: ordered. Radiology: ordered.   ***  {Document critical care time when appropriate  Document review of labs and clinical decision tools ie [id number], etc  Document your independent review of radiology images and any outside records  Document your discussion with family members, caretakers and with consultants  Document social determinants of health affecting pt's care  Document your decision making why or why not admission, treatments were needed:32947:::1}   Final diagnoses:  None    ED Discharge Orders     None

## 2024-08-30 ENCOUNTER — Telehealth: Payer: Self-pay | Admitting: *Deleted

## 2024-08-30 ENCOUNTER — Inpatient Hospital Stay (HOSPITAL_COMMUNITY)
Admission: EM | Admit: 2024-08-30 | Discharge: 2024-09-02 | DRG: 167 | Disposition: A | Discharge status: Home / Self Care | Attending: Internal Medicine | Admitting: Internal Medicine

## 2024-08-30 ENCOUNTER — Encounter (HOSPITAL_COMMUNITY): Payer: Self-pay | Admitting: Emergency Medicine

## 2024-08-30 ENCOUNTER — Other Ambulatory Visit: Payer: Self-pay

## 2024-08-30 ENCOUNTER — Emergency Department (HOSPITAL_COMMUNITY)

## 2024-08-30 DIAGNOSIS — D638 Anemia in other chronic diseases classified elsewhere: Secondary | ICD-10-CM | POA: Diagnosis present

## 2024-08-30 DIAGNOSIS — I129 Hypertensive chronic kidney disease with stage 1 through stage 4 chronic kidney disease, or unspecified chronic kidney disease: Secondary | ICD-10-CM | POA: Diagnosis present

## 2024-08-30 DIAGNOSIS — Z85528 Personal history of other malignant neoplasm of kidney: Secondary | ICD-10-CM

## 2024-08-30 DIAGNOSIS — J700 Acute pulmonary manifestations due to radiation: Secondary | ICD-10-CM | POA: Diagnosis present

## 2024-08-30 DIAGNOSIS — C649 Malignant neoplasm of unspecified kidney, except renal pelvis: Secondary | ICD-10-CM | POA: Diagnosis not present

## 2024-08-30 DIAGNOSIS — N529 Male erectile dysfunction, unspecified: Secondary | ICD-10-CM | POA: Diagnosis present

## 2024-08-30 DIAGNOSIS — C7802 Secondary malignant neoplasm of left lung: Secondary | ICD-10-CM | POA: Diagnosis present

## 2024-08-30 DIAGNOSIS — Z79890 Hormone replacement therapy (postmenopausal): Secondary | ICD-10-CM | POA: Diagnosis not present

## 2024-08-30 DIAGNOSIS — Y842 Radiological procedure and radiotherapy as the cause of abnormal reaction of the patient, or of later complication, without mention of misadventure at the time of the procedure: Secondary | ICD-10-CM | POA: Diagnosis present

## 2024-08-30 DIAGNOSIS — J189 Pneumonia, unspecified organism: Principal | ICD-10-CM | POA: Diagnosis present

## 2024-08-30 DIAGNOSIS — C7931 Secondary malignant neoplasm of brain: Secondary | ICD-10-CM | POA: Diagnosis present

## 2024-08-30 DIAGNOSIS — R5381 Other malaise: Secondary | ICD-10-CM | POA: Diagnosis present

## 2024-08-30 DIAGNOSIS — I10 Essential (primary) hypertension: Secondary | ICD-10-CM | POA: Insufficient documentation

## 2024-08-30 DIAGNOSIS — Z880 Allergy status to penicillin: Secondary | ICD-10-CM

## 2024-08-30 DIAGNOSIS — Z9221 Personal history of antineoplastic chemotherapy: Secondary | ICD-10-CM | POA: Diagnosis not present

## 2024-08-30 DIAGNOSIS — Z79899 Other long term (current) drug therapy: Secondary | ICD-10-CM

## 2024-08-30 DIAGNOSIS — G4733 Obstructive sleep apnea (adult) (pediatric): Secondary | ICD-10-CM | POA: Diagnosis present

## 2024-08-30 DIAGNOSIS — Z905 Acquired absence of kidney: Secondary | ICD-10-CM

## 2024-08-30 DIAGNOSIS — R918 Other nonspecific abnormal finding of lung field: Secondary | ICD-10-CM | POA: Diagnosis not present

## 2024-08-30 DIAGNOSIS — J984 Other disorders of lung: Secondary | ICD-10-CM | POA: Diagnosis present

## 2024-08-30 DIAGNOSIS — D649 Anemia, unspecified: Secondary | ICD-10-CM | POA: Diagnosis not present

## 2024-08-30 DIAGNOSIS — Z923 Personal history of irradiation: Secondary | ICD-10-CM | POA: Diagnosis not present

## 2024-08-30 DIAGNOSIS — J9601 Acute respiratory failure with hypoxia: Secondary | ICD-10-CM | POA: Diagnosis present

## 2024-08-30 DIAGNOSIS — E039 Hypothyroidism, unspecified: Secondary | ICD-10-CM | POA: Diagnosis present

## 2024-08-30 DIAGNOSIS — C7951 Secondary malignant neoplasm of bone: Secondary | ICD-10-CM | POA: Diagnosis present

## 2024-08-30 DIAGNOSIS — R0600 Dyspnea, unspecified: Secondary | ICD-10-CM | POA: Diagnosis not present

## 2024-08-30 DIAGNOSIS — N182 Chronic kidney disease, stage 2 (mild): Secondary | ICD-10-CM | POA: Diagnosis present

## 2024-08-30 DIAGNOSIS — J9600 Acute respiratory failure, unspecified whether with hypoxia or hypercapnia: Secondary | ICD-10-CM | POA: Diagnosis not present

## 2024-08-30 DIAGNOSIS — Z881 Allergy status to other antibiotic agents status: Secondary | ICD-10-CM | POA: Diagnosis not present

## 2024-08-30 DIAGNOSIS — J90 Pleural effusion, not elsewhere classified: Secondary | ICD-10-CM | POA: Diagnosis not present

## 2024-08-30 DIAGNOSIS — K754 Autoimmune hepatitis: Secondary | ICD-10-CM | POA: Diagnosis present

## 2024-08-30 DIAGNOSIS — R0602 Shortness of breath: Secondary | ICD-10-CM

## 2024-08-30 DIAGNOSIS — R911 Solitary pulmonary nodule: Secondary | ICD-10-CM | POA: Diagnosis not present

## 2024-08-30 DIAGNOSIS — Z1152 Encounter for screening for COVID-19: Secondary | ICD-10-CM

## 2024-08-30 DIAGNOSIS — J188 Other pneumonia, unspecified organism: Principal | ICD-10-CM

## 2024-08-30 DIAGNOSIS — Z91048 Other nonmedicinal substance allergy status: Secondary | ICD-10-CM

## 2024-08-30 DIAGNOSIS — Z48813 Encounter for surgical aftercare following surgery on the respiratory system: Secondary | ICD-10-CM | POA: Diagnosis not present

## 2024-08-30 LAB — CBC WITH DIFFERENTIAL/PLATELET
Abs Immature Granulocytes: 0.04 K/uL (ref 0.00–0.07)
Basophils Absolute: 0 K/uL (ref 0.0–0.1)
Basophils Relative: 1 %
Eosinophils Absolute: 0.3 K/uL (ref 0.0–0.5)
Eosinophils Relative: 4 %
HCT: 38 % — ABNORMAL LOW (ref 39.0–52.0)
Hemoglobin: 12 g/dL — ABNORMAL LOW (ref 13.0–17.0)
Immature Granulocytes: 1 %
Lymphocytes Relative: 10 %
Lymphs Abs: 0.9 K/uL (ref 0.7–4.0)
MCH: 25.5 pg — ABNORMAL LOW (ref 26.0–34.0)
MCHC: 31.6 g/dL (ref 30.0–36.0)
MCV: 80.7 fL (ref 80.0–100.0)
Monocytes Absolute: 0.8 K/uL (ref 0.1–1.0)
Monocytes Relative: 9 %
Neutro Abs: 6.4 K/uL (ref 1.7–7.7)
Neutrophils Relative %: 75 %
Platelets: 390 K/uL (ref 150–400)
RBC: 4.71 MIL/uL (ref 4.22–5.81)
RDW: 18.2 % — ABNORMAL HIGH (ref 11.5–15.5)
WBC: 8.4 K/uL (ref 4.0–10.5)
nRBC: 0 % (ref 0.0–0.2)

## 2024-08-30 LAB — COMPREHENSIVE METABOLIC PANEL WITH GFR
ALT: 31 U/L (ref 0–44)
AST: 23 U/L (ref 15–41)
Albumin: 2.8 g/dL — ABNORMAL LOW (ref 3.5–5.0)
Alkaline Phosphatase: 68 U/L (ref 38–126)
Anion gap: 14 (ref 5–15)
BUN: 10 mg/dL (ref 6–20)
CO2: 18 mmol/L — ABNORMAL LOW (ref 22–32)
Calcium: 8.5 mg/dL — ABNORMAL LOW (ref 8.9–10.3)
Chloride: 105 mmol/L (ref 98–111)
Creatinine, Ser: 1.25 mg/dL — ABNORMAL HIGH (ref 0.61–1.24)
GFR, Estimated: >60 mL/min (ref >60)
Glucose, Bld: 94 mg/dL (ref 70–99)
Potassium: 4 mmol/L (ref 3.5–5.1)
Sodium: 137 mmol/L (ref 135–145)
Total Bilirubin: 0.7 mg/dL (ref 0.0–1.2)
Total Protein: 6.6 g/dL (ref 6.5–8.1)

## 2024-08-30 LAB — URINALYSIS, W/ REFLEX TO CULTURE (INFECTION SUSPECTED)
Bacteria, UA: NONE SEEN
Bilirubin Urine: NEGATIVE
Glucose, UA: NEGATIVE mg/dL
Ketones, ur: NEGATIVE mg/dL
Leukocytes,Ua: NEGATIVE
Nitrite: NEGATIVE
Protein, ur: 30 mg/dL — AB
Specific Gravity, Urine: 1.025 (ref 1.005–1.030)
pH: 5 (ref 5.0–8.0)

## 2024-08-30 LAB — I-STAT CG4 LACTIC ACID, ED: Lactic Acid, Venous: 0.8 mmol/L (ref 0.5–1.9)

## 2024-08-30 MED ORDER — SODIUM CHLORIDE 0.9 % IV SOLN
2.0000 g | Freq: Once | INTRAVENOUS | Status: AC
  Administered 2024-08-31: 2 g via INTRAVENOUS
  Filled 2024-08-30: qty 12.5

## 2024-08-30 MED ORDER — VANCOMYCIN HCL 2000 MG/400ML IV SOLN
2000.0000 mg | Freq: Once | INTRAVENOUS | Status: AC
  Administered 2024-08-31: 2000 mg via INTRAVENOUS
  Filled 2024-08-30 (×2): qty 400

## 2024-08-30 NOTE — ED Provider Triage Note (Addendum)
 Emergency Medicine Provider Triage Evaluation Note  Michael [name] , a 58 y.o. male  was evaluated in triage.  Pt complains of sob. Hx of RCC undergoing chemo therapy.  Has had fever, cp, sob, cough ongoing for the past week.  Seen in the ER several days ago for same, had negative chest cta.  D/c home but not improving.  PCP and oncology request hospital admission and likely pulm and ID consult.    Review of Systems  Positive: As above Negative: As above  Physical Exam  BP 115/75   Pulse (!) 101   Temp 99.8 F (37.7 C) (Oral)   Resp 18   SpO2 93%  Gen:   Awake, no distress   Resp:  Normal effort  MSK:   Moves extremities without difficulty  Other:    Medical Decision Making  Medically screening exam initiated at 5:16 PM.  Appropriate orders placed.  Michael [name] was informed that the remainder of the evaluation will be completed by another provider, this initial triage assessment does not replace that evaluation, and the importance of remaining in the ED until their evaluation is complete.  Dr. Monia message : I called ID today and discussed the case and the recommendation was to go back to the ED - get admitted - get Pulm and consider Bronch. Get ID and let them help. Get Dr Mohammed/Onc for consult and come up with reason for the CT scan finding c recurrent fevers and hypoxia    Nivia Colon, PA-C 08/30/24 1717    Nivia Colon, PA-C 08/30/24 1741

## 2024-08-30 NOTE — ED Provider Notes (Signed)
 [place] EMERGENCY DEPARTMENT AT [facility] Provider Note   CSN: [id number] Arrival date & time: 08/30/24  1609     Patient presents with: Shortness of Breath and Fever   Michael [name] is a 58 y.o. male.  {Add pertinent medical, surgical, social history, OB history to YEP:67052} The history is provided by the patient and medical records.  Shortness of Breath Associated symptoms: fever   Fever  58 year old male with history of renal cell carcinoma with brain, pulmonary, and bony mets status post chemoradiation, currently on immunotherapy, sleep apnea, hypertension, hypothyroidism, presenting to the ED from home due to ongoing fever and shortness of breath.  Seen in the ED a few days ago and sent home with outpatient antibiotics.  He has continued to have intermittent fevers, night sweats, and shortness of breath.  Saturations at home have been 92-93 maximum.  He is having trouble just walking around at this point.  He did have follow-up with PCP today who discussed with oncology and recommended to come in for IV antibiotics with infectious disease consult, pulmonary consult with possible bronc.  Prior CTs with pneumonitis versus pneumonia.  Patient is not generally oxygen  dependent.  Recent covid/flu testing negative.  Prior to Admission medications   Medication Sig Start Date End Date Taking? Authorizing Provider  amLODipine  (NORVASC ) 5 MG tablet Take 7.5 mg by mouth daily before breakfast. 04/23/21   [provider]  azithromycin (ZITHROMAX) 250 MG tablet Take 2 tablets (500 mg total) by mouth daily for 1 day, THEN 1 tablet (250 mg total) daily for 4 days. Take first 2 tablets together, then 1 every day until finished. 08/27/24 09/01/24  Ruthell Lonni FALCON, PA-C  doxycycline  (VIBRAMYCIN ) 100 MG capsule Take 1 capsule (100 mg total) by mouth 2 (two) times daily. 08/27/24   Ruthell Lonni FALCON, PA-C  guaiFENesin (MUCINEX) 600 MG 12 hr tablet Take 1 tablet (600 mg  total) by mouth 2 (two) times daily as needed for cough or to loosen phlegm. 08/05/24   Vann, Jessica U, DO  levothyroxine  (SYNTHROID ) 150 MCG tablet Take 1 tablet (150 mcg total) by mouth daily before breakfast. 07/19/24   [name], MD  montelukast  (SINGULAIR ) 10 MG tablet Take 10 mg by mouth daily before breakfast.    [provider]  ondansetron  (ZOFRAN ) 8 MG tablet Take 1 tablet (8 mg total) by mouth every 8 (eight) hours as needed for nausea or vomiting. Patient not taking: Reported on 04/27/2024 12/15/23   [name], MD  predniSONE  (DELTASONE ) 10 MG tablet 50 mg x 3 days then 40mg  x 3 days then 30mg  x 3 days then 20mg  x 3 days then 10mg  x3 days then stop 08/05/24   Vann, Jessica U, DO  tadalafil (CIALIS) 20 MG tablet Take 10-20 mg by mouth daily as needed for erectile dysfunction.    [provider]    Allergies: Cleocin [clindamycin], Penicillins, and Tape    Review of Systems  Constitutional:  Positive for fever.  Respiratory:  Positive for shortness of breath.   All other systems reviewed and are negative.   Updated Vital Signs BP 115/75   Pulse (!) 101   Temp 99.8 F (37.7 C) (Oral)   Resp 18   SpO2 93%   Physical Exam Vitals and nursing note reviewed.  Constitutional:      Appearance: He is well-developed.  HENT:     Head: Normocephalic and atraumatic.  Eyes:     Conjunctiva/sclera: Conjunctivae normal.  Pupils: Pupils are equal, round, and reactive to light.  Cardiovascular:     Rate and Rhythm: Normal rate and regular rhythm.     Heart sounds: Normal heart sounds.  Pulmonary:     Effort: Pulmonary effort is normal.     Breath sounds: Normal breath sounds. No wheezing or rhonchi.     Comments: Seems to get a bit winded during conversation, sats around 92-95% during exam, no distress Abdominal:     General: Bowel sounds are normal.     Palpations: Abdomen is soft.  Musculoskeletal:        General: Normal range of motion.      Cervical back: Normal range of motion.  Skin:    General: Skin is warm and dry.  Neurological:     Mental Status: He is alert and oriented to person, place, and time.     (all labs ordered are listed, but only abnormal results are displayed) Labs Reviewed  COMPREHENSIVE METABOLIC PANEL WITH GFR - Abnormal; Notable for the following components:      Result Value   CO2 18 (*)    Creatinine, Ser 1.25 (*)    Calcium 8.5 (*)    Albumin 2.8 (*)    All other components within normal limits  CBC WITH DIFFERENTIAL/PLATELET - Abnormal; Notable for the following components:   Hemoglobin 12.0 (*)    HCT 38.0 (*)    MCH 25.5 (*)    RDW 18.2 (*)    All other components within normal limits  URINALYSIS, W/ REFLEX TO CULTURE (INFECTION SUSPECTED) - Abnormal; Notable for the following components:   Color, Urine AMBER (*)    Hgb urine dipstick SMALL (*)    Protein, ur 30 (*)    All other components within normal limits  CULTURE, BLOOD (ROUTINE X 2)  CULTURE, BLOOD (ROUTINE X 2)  MRSA NEXT GEN BY PCR, NASAL  I-STAT CG4 LACTIC ACID, ED  I-STAT CG4 LACTIC ACID, ED    EKG: None  Radiology: DG Chest 2 View Result Date: 08/30/2024 EXAM: 2 VIEW(S) XRAY OF THE CHEST 08/30/2024 05:36:00 PM COMPARISON: 08/26/2024 CLINICAL HISTORY: [id number] SOB (shortness of breath) [id number]. Michael [name] , a 58 y.o. male was evaluated in triage. Pt complains of sob. Hx of RCC undergoing chemo therapy. Pt complains of sob. FINDINGS: LUNGS AND PLEURA: Multifocal airspace disease in the bilateral upper lobes and bilateral lower lobes, not significantly changed. No pulmonary edema. No pleural effusion. No pneumothorax. HEART AND MEDIASTINUM: No acute abnormality of the cardiac and mediastinal silhouettes. BONES AND SOFT TISSUES: No acute osseous abnormality. IMPRESSION: 1. Stable Multifocal airspace disease in the bilateral upper lobes and bilateral lower lobes. 2. No pleural effusion or pneumothorax. Electronically signed  by: Greig Pique MD 08/30/2024 06:07 PM EDT RP Workstation: [id number]    {Document cardiac monitor, telemetry assessment procedure when appropriate:32947} Procedures   CRITICAL CARE Performed by: Olam CHRISTELLA Slocumb   Total critical care time: 45 minutes  Critical care time was exclusive of separately billable procedures and treating other patients.  Critical care was necessary to treat or prevent imminent or life-threatening deterioration.  Critical care was time spent personally by me on the following activities: development of treatment plan with patient and/or surrogate as well as nursing, discussions with consultants, evaluation of patient's response to treatment, examination of patient, obtaining history from patient or surrogate, ordering and performing treatments and interventions, ordering and review of laboratory studies, ordering and review of radiographic studies, pulse oximetry and re-evaluation of  patient's condition.   Medications Ordered in the ED  vancomycin  (VANCOREADY) IVPB 2000 mg/400 mL (has no administration in time range)  ceFEPIme (MAXIPIME) 2 g in sodium chloride  0.9 % 100 mL IVPB (has no administration in time range)      {Click here for ABCD2, HEART and other calculators REFRESH Note before signing:1}                              Medical Decision Making Amount and/or Complexity of Data Reviewed Labs: ordered. Radiology: ordered and independent interpretation performed.  Risk Prescription drug management. Decision regarding hospitalization.   58 y.o. M presenting to the ED with shortness of breath and fever.  Has been having ongoing symptoms for about a month despite several courses of antibiotic therapy.  Does have known renal cell carcinoma with mets, currently only on immunotherapy.  He is followed by oncology, Dr. Gatha.  Did have a fever on arrival here but afebrile by time of my evaluation.  Sats are marginal around 92-95, does seem to get winded  with conversation.  He is not acutely distressed and nontoxic in appearance.    Labs as above--normal lactate, no leukocytosis.  Creatinine appears at baseline.  Chest x-ray with continued multifocal infiltrates.  Will repeat blood cultures.  Starting IV abx.  Has recently been on doxycycline  and azithromycin, will step up to vancomycin  and cefepime.  Per discussions with PCP and oncology, recommended for admission with IV antibiotics and infectious disease consult.  Also recommending pulmonary consult with possible bronc.  Final diagnoses:  Multifocal pneumonia  Shortness of breath    ED Discharge Orders     None

## 2024-08-30 NOTE — Telephone Encounter (Signed)
 Michael [name] called, left VM:  He had a CT while in ED last Thursday night. He hoped Dr. Sherrod had seen CT and would like a call from him to discuss results.  He would like to schedule an appointment with him to discuss next steps or plan of care and he has time in the morning if Dr. Sherrod can schedule him then.

## 2024-08-30 NOTE — ED Triage Notes (Signed)
 PCP sent patient to be admitted for shob and fever. Patient has been having symptoms for the past coupe weeks.

## 2024-08-30 NOTE — Progress Notes (Signed)
 ED Pharmacy Antibiotic Sign Off An antibiotic consult was received from an ED provider for cefepime/vancomycin  per pharmacy dosing for pneumonia. A chart review was completed to assess appropriateness.   The following one time order(s) were placed:   -Cefepime 2g IV x1 (has tolerated cephalosporins in the past) -Vancomycin  2g IV x1  Further antibiotic and/or antibiotic pharmacy consults should be ordered by the admitting provider if indicated.   Thank you for allowing pharmacy to be a part of this patient's care.   Lynwood Poplar, [facility]  Clinical Pharmacist 08/30/24 11:22 PM

## 2024-08-30 NOTE — Progress Notes (Signed)
 Michael [name] has had a complicated 1 mo. Original CT was Pneumonitis vrs PNA in pt c renal cell carcinoma with brain, pulmonary, bone metastasis s/p chemo/radiation, resection cerebellar met, and currently getting immunotherapy per Dr Gatha. CT 07/2024 Chest 1. New dense airspace consolidation in the RIGHT upper lobe and RIGHT and LEFT lower lobes adjacent to osseous radiation treatment sites. Findings most consistent with significant progressive radiation pneumonitis. 2. No mediastinal adenopathy. 3. Interval decrease in size of lytic lesion in the RIGHT aspect of the manubrium. 4. Interval decrease in size of lytic lesion at the T9 vertebral body.  PELVIS: 1. No evidence of local recurrence in the RIGHT nephrectomy bed. 2. New small focal soft tissue dilatation of the LEFT ureter without obstruction. Recommend CT urogram with contrast or MRI urogram without contrast if patient cannot receive IV contrast. Alternatively consider cystoscopy and ureteroscopy for evaluation depending on clinical concern. . He subsequently was admitted and Rxed for PNA Abx/steroids and inpt Rx. recent med issues well documented . He saw Dr Loretha On Monday Oct 20/2025 and had another infusion. 1) Opdivo  480 mg IV every 4 weeks and Cabometyx  40 mg p.o. daily  He has been traveling, playing tennis, golfing and functioning relatively normal until 1 day before I saw him again 08/27/2024 c  DOE/SOB  fever Went back to ED 10/23 = TMax 101.4; O2 dropped to low 90s CTPA in ED showed - Changes consistent with bony metastatic disease stable from the prior exam. Waxing and waning infiltrates in both lungs. The left upper lobe shows the most amount of worsening infiltrate. Lungs/Pleura: Right-sided airspace infiltrates are identified which has an of improved somewhat in the interval from the prior CT examination. Some improved aeration in the left lower lobe is noted when compare with the prior exam although  increase in left upper lobe infiltrate is seen. There are some rounded densities identified suspicious for metastatic disease. The most prominent of these measures 7 mm in the right upper lobe best seen on image number 50 of series 12. This is slightly increased from 5.5 mm on the prior exam. Rounded nodular densities the largest of which lies in the right upper lobe and suspicious for metastatic disease given the clinical history. No evidence of pulmonary emboli. . Since I did not know the best wasy to procede I called and I discussed this with Dr Gatha. ? complicated PNA although clinical picture not consistent.  Dif Dx includes the cancer itself, Pneumonitis from the meds/XRT, some weird pulm issues - may need Bronch. Pt feels immunotherapy shrinking tumors and less chest Sxs/ Better movement since starting the immunotherapy, Vrs something more Parasitic or problematic At the end of last weeks visit - I referred him to [facility] - clearly that has not happened yet . He called today c Sxs of Fever - 101 and Hypoxia again. He was on and finishing the Azithro. I called ID today and discussed the case and the recommendation was to go back to the ED - get admitted - get Pulm and consider Bronch. Get ID and let the help.  Get Dr Mohammed/Onc for consult and come up with reason for the CT scan finding c recurrent fevers and hypoxia

## 2024-08-30 NOTE — Telephone Encounter (Signed)
 Dr. Sherrod reviewed patient's message. His response: patient can be scheduled first available with me.  Contacted Mr. Dettman to inform that first available appt with Dr. Sherrod is Thursday 11/30 at 0815.   TCT patient: Appt scheduled for him 11/30 @ 0815.  LVM with above appointment time and asked him to contact office if he is not able to keep this appt.

## 2024-08-30 NOTE — ED Triage Notes (Signed)
 Pt is being actively tx with immunotherapy for kidney cancer.

## 2024-08-31 ENCOUNTER — Encounter (HOSPITAL_COMMUNITY): Payer: Self-pay | Admitting: Internal Medicine

## 2024-08-31 DIAGNOSIS — Z880 Allergy status to penicillin: Secondary | ICD-10-CM | POA: Diagnosis not present

## 2024-08-31 DIAGNOSIS — Z905 Acquired absence of kidney: Secondary | ICD-10-CM | POA: Diagnosis not present

## 2024-08-31 DIAGNOSIS — I129 Hypertensive chronic kidney disease with stage 1 through stage 4 chronic kidney disease, or unspecified chronic kidney disease: Secondary | ICD-10-CM | POA: Diagnosis present

## 2024-08-31 DIAGNOSIS — R5381 Other malaise: Secondary | ICD-10-CM | POA: Diagnosis present

## 2024-08-31 DIAGNOSIS — Z85528 Personal history of other malignant neoplasm of kidney: Secondary | ICD-10-CM | POA: Diagnosis not present

## 2024-08-31 DIAGNOSIS — C7931 Secondary malignant neoplasm of brain: Secondary | ICD-10-CM | POA: Diagnosis present

## 2024-08-31 DIAGNOSIS — D638 Anemia in other chronic diseases classified elsewhere: Secondary | ICD-10-CM | POA: Diagnosis present

## 2024-08-31 DIAGNOSIS — E039 Hypothyroidism, unspecified: Secondary | ICD-10-CM | POA: Insufficient documentation

## 2024-08-31 DIAGNOSIS — J700 Acute pulmonary manifestations due to radiation: Secondary | ICD-10-CM | POA: Diagnosis present

## 2024-08-31 DIAGNOSIS — R0600 Dyspnea, unspecified: Secondary | ICD-10-CM | POA: Diagnosis not present

## 2024-08-31 DIAGNOSIS — Y842 Radiological procedure and radiotherapy as the cause of abnormal reaction of the patient, or of later complication, without mention of misadventure at the time of the procedure: Secondary | ICD-10-CM | POA: Diagnosis present

## 2024-08-31 DIAGNOSIS — C7802 Secondary malignant neoplasm of left lung: Secondary | ICD-10-CM | POA: Diagnosis present

## 2024-08-31 DIAGNOSIS — Z1152 Encounter for screening for COVID-19: Secondary | ICD-10-CM | POA: Diagnosis not present

## 2024-08-31 DIAGNOSIS — I10 Essential (primary) hypertension: Secondary | ICD-10-CM | POA: Diagnosis not present

## 2024-08-31 DIAGNOSIS — Z9221 Personal history of antineoplastic chemotherapy: Secondary | ICD-10-CM | POA: Diagnosis not present

## 2024-08-31 DIAGNOSIS — J984 Other disorders of lung: Secondary | ICD-10-CM

## 2024-08-31 DIAGNOSIS — J188 Other pneumonia, unspecified organism: Secondary | ICD-10-CM | POA: Diagnosis not present

## 2024-08-31 DIAGNOSIS — C7951 Secondary malignant neoplasm of bone: Secondary | ICD-10-CM | POA: Diagnosis present

## 2024-08-31 DIAGNOSIS — C649 Malignant neoplasm of unspecified kidney, except renal pelvis: Secondary | ICD-10-CM

## 2024-08-31 DIAGNOSIS — Z91048 Other nonmedicinal substance allergy status: Secondary | ICD-10-CM | POA: Diagnosis not present

## 2024-08-31 DIAGNOSIS — G4733 Obstructive sleep apnea (adult) (pediatric): Secondary | ICD-10-CM | POA: Diagnosis present

## 2024-08-31 DIAGNOSIS — N529 Male erectile dysfunction, unspecified: Secondary | ICD-10-CM | POA: Diagnosis present

## 2024-08-31 DIAGNOSIS — Z923 Personal history of irradiation: Secondary | ICD-10-CM | POA: Diagnosis not present

## 2024-08-31 DIAGNOSIS — Z79899 Other long term (current) drug therapy: Secondary | ICD-10-CM | POA: Diagnosis not present

## 2024-08-31 DIAGNOSIS — J189 Pneumonia, unspecified organism: Secondary | ICD-10-CM | POA: Diagnosis present

## 2024-08-31 DIAGNOSIS — Z881 Allergy status to other antibiotic agents status: Secondary | ICD-10-CM | POA: Diagnosis not present

## 2024-08-31 DIAGNOSIS — Z79890 Hormone replacement therapy (postmenopausal): Secondary | ICD-10-CM | POA: Diagnosis not present

## 2024-08-31 DIAGNOSIS — R0602 Shortness of breath: Secondary | ICD-10-CM | POA: Diagnosis present

## 2024-08-31 DIAGNOSIS — K754 Autoimmune hepatitis: Secondary | ICD-10-CM | POA: Diagnosis present

## 2024-08-31 DIAGNOSIS — N182 Chronic kidney disease, stage 2 (mild): Secondary | ICD-10-CM | POA: Insufficient documentation

## 2024-08-31 DIAGNOSIS — D649 Anemia, unspecified: Secondary | ICD-10-CM | POA: Diagnosis not present

## 2024-08-31 LAB — CBC
HCT: 34.5 % — ABNORMAL LOW (ref 39.0–52.0)
Hemoglobin: 10.7 g/dL — ABNORMAL LOW (ref 13.0–17.0)
MCH: 25.7 pg — ABNORMAL LOW (ref 26.0–34.0)
MCHC: 31 g/dL (ref 30.0–36.0)
MCV: 82.9 fL (ref 80.0–100.0)
Platelets: 358 K/uL (ref 150–400)
RBC: 4.16 MIL/uL — ABNORMAL LOW (ref 4.22–5.81)
RDW: 18.2 % — ABNORMAL HIGH (ref 11.5–15.5)
WBC: 7.2 K/uL (ref 4.0–10.5)
nRBC: 0 % (ref 0.0–0.2)

## 2024-08-31 LAB — COMPREHENSIVE METABOLIC PANEL WITH GFR
ALT: 36 U/L (ref 0–44)
AST: 37 U/L (ref 15–41)
Albumin: 2.5 g/dL — ABNORMAL LOW (ref 3.5–5.0)
Alkaline Phosphatase: 62 U/L (ref 38–126)
Anion gap: 11 (ref 5–15)
BUN: 12 mg/dL (ref 6–20)
CO2: 22 mmol/L (ref 22–32)
Calcium: 8.3 mg/dL — ABNORMAL LOW (ref 8.9–10.3)
Chloride: 105 mmol/L (ref 98–111)
Creatinine, Ser: 1.31 mg/dL — ABNORMAL HIGH (ref 0.61–1.24)
GFR, Estimated: >60 mL/min (ref >60)
Glucose, Bld: 120 mg/dL — ABNORMAL HIGH (ref 70–99)
Potassium: 3.9 mmol/L (ref 3.5–5.1)
Sodium: 138 mmol/L (ref 135–145)
Total Bilirubin: 0.8 mg/dL (ref 0.0–1.2)
Total Protein: 5.8 g/dL — ABNORMAL LOW (ref 6.5–8.1)

## 2024-08-31 LAB — RESPIRATORY PANEL BY PCR

## 2024-08-31 LAB — LACTATE DEHYDROGENASE: LDH: 206 U/L — ABNORMAL HIGH (ref 98–192)

## 2024-08-31 LAB — MRSA NEXT GEN BY PCR, NASAL: MRSA by PCR Next Gen: NOT DETECTED

## 2024-08-31 LAB — BRAIN NATRIURETIC PEPTIDE: B Natriuretic Peptide: 17.5 pg/mL (ref 0.0–100.0)

## 2024-08-31 LAB — SARS CORONAVIRUS 2 BY RT PCR: SARS Coronavirus 2 by RT PCR: NEGATIVE

## 2024-08-31 MED ORDER — VANCOMYCIN HCL 750 MG/150ML IV SOLN: 750.0000 mg | Freq: Two times a day (BID) | INTRAVENOUS | Status: DC

## 2024-08-31 MED ORDER — ENOXAPARIN SODIUM 60 MG/0.6ML IJ SOSY
55.0000 mg | PREFILLED_SYRINGE | INTRAMUSCULAR | Status: DC
  Filled 2024-08-31 (×2): qty 0.6

## 2024-08-31 MED ORDER — LEVOTHYROXINE SODIUM 75 MCG PO TABS
150.0000 ug | ORAL_TABLET | Freq: Every day | ORAL | Status: DC
  Administered 2024-08-31 – 2024-09-02 (×2): 150 ug via ORAL
  Filled 2024-08-31 (×3): qty 2

## 2024-08-31 MED ORDER — SODIUM CHLORIDE 0.9 % IV SOLN: 2.0000 g | Freq: Three times a day (TID) | INTRAVENOUS | Status: DC

## 2024-08-31 MED ORDER — ACETAMINOPHEN 650 MG RE SUPP: 650.0000 mg | Freq: Four times a day (QID) | RECTAL | Status: DC | PRN

## 2024-08-31 MED ORDER — AMLODIPINE BESYLATE 5 MG PO TABS
7.5000 mg | ORAL_TABLET | Freq: Every day | ORAL | Status: DC
  Administered 2024-08-31 – 2024-09-02 (×2): 7.5 mg via ORAL
  Filled 2024-08-31: qty 2
  Filled 2024-08-31: qty 1

## 2024-08-31 MED ORDER — ACETAMINOPHEN 325 MG PO TABS
650.0000 mg | ORAL_TABLET | Freq: Four times a day (QID) | ORAL | Status: DC | PRN
  Administered 2024-08-31: 650 mg via ORAL
  Filled 2024-08-31: qty 2

## 2024-08-31 MED ORDER — MONTELUKAST SODIUM 10 MG PO TABS
10.0000 mg | ORAL_TABLET | Freq: Every day | ORAL | Status: DC
  Administered 2024-08-31 – 2024-09-02 (×3): 10 mg via ORAL
  Filled 2024-08-31 (×3): qty 1

## 2024-08-31 NOTE — Progress Notes (Signed)
 Pharmacy Antibiotic Note  Michael [name] is a 58 y.o. male admitted on 08/30/2024 with pneumonia.  Pharmacy has been consulted for vancomycin  and cefepime dosing.  Plan: Vancomycin  750 mg IV Q12H. Goal AUC 400-550.  Expected AUC 500. Cefepime 2g IV Q8H.  Temp (24hrs), Avg:100.2 F (37.9 C), Min:99.5 F (37.5 C), Max:101.3 F (38.5 C)  Recent Labs  Lab 08/26/24 2301 08/26/24 2323 08/27/24 0356 08/30/24 1717 08/30/24 1732  WBC 8.9  --  5.9 8.4  --   CREATININE 1.37*  --   --  1.25*  --   LATICACIDVEN  --  1.5  --   --  0.8    Estimated Creatinine Clearance: 77.6 mL/min (A) (by C-G formula based on SCr of 1.25 mg/dL (H)).    Allergies  Allergen Reactions   Cleocin [Clindamycin] Rash   Penicillins Other (See Comments)    Unknown reaction in childhood   Tape Rash    Thank you for allowing pharmacy to be a part of this patient's care.  Marvetta Dauphin, PharmD, BCPS  08/31/2024 1:39 AM

## 2024-08-31 NOTE — Progress Notes (Signed)
 Bronchoscopy scheduled for 10/29 at 12:15 PM Please keep n.p.o. after midnight Discussed with patient and wife.  Lonna Coder MD [facility] 08/31/2024, 4:19 PM

## 2024-08-31 NOTE — Consult Note (Signed)
 NAME:  Michael [name], MRN:  [id number], DOB:  Mar 18, 1966, LOS: 0 ADMISSION DATE:  08/30/2024, CONSULTATION DATE:  08/31/2024 REFERRING MD:  JONELLE Copa MD, CHIEF COMPLAINT:  Acute resp failure, dyspnea   History of Present Illness:   The patient is a 58 year old with stage 4 renal cell cancer who presents with persistent shortness of breath.   Dyspnea and hypoxemia - Persistent shortness of breath for approximately one month, unresponsive to antibiotics and steroids - Hospitalized in early October for hypoxemia with oxygen  saturation in the 80s - Recent oxygen  saturation fluctuating, dropping to the low 90s - Fever associated with episodes of hypoxemia - Shortness of breath improved temporarily with steroids but never fully resolved - Increased difficulty breathing during cold weather - No cough or phlegm production  Oncologic history and treatment - Stage IV renal cell carcinoma - Status post right nephrectomy - History of brain tumor resected in December - Chest and spine tumors treated with stereotactic radiation - Currently receiving nivolumab  - Cabometyx  discontinued due to liver toxicity  Current medications and interventions - Tapering course of steroids - Recent courses of antibiotics including azithromycin (Z-Pak) and possibly doxycycline  - Oxygen  therapy administered during hospitalization  Functional status - Engages in activities such as golf and tennis - Experiences increased dyspnea with exertion, particularly in cold weather    Pertinent  Medical History    has a past medical history of Cancer (HCC), Clear cell renal cell carcinoma (HCC), Hypertension, Hypothyroidism, and Sleep apnea.   Significant Hospital Events: Including procedures, antibiotic start and stop dates in addition to other pertinent events     Interim History / Subjective:    Objective    Blood pressure 118/84, pulse 77, temperature 98.2 F (36.8 C), temperature source Oral, resp. rate  16, SpO2 96%.       No intake or output data in the 24 hours ending 08/31/24 1255 There were no vitals filed for this visit.  Examination: Blood pressure 118/84, pulse 77, temperature 98.2 F (36.8 C), temperature source Oral, resp. rate 16, SpO2 96%. Gen:      No acute distress HEENT:  EOMI, sclera anicteric Neck:     No masses; no thyromegaly Lungs:    Clear to auscultation bilaterally; normal respiratory effort CV:         Regular rate and rhythm; no murmurs Abd:      + bowel sounds; soft, non-tender; no palpable masses, no distension Ext:    No edema; adequate peripheral perfusion Neuro: alert and oriented x 3 Psych: normal mood and affect   Resolved problem list   Assessment and Plan  Shortness of breath with pulmonary infiltrates and nodular opacities Persistent shortness of breath for about a month with pulmonary infiltrates and nodular opacities in the upper lobes. Differential diagnosis includes pneumonia, pneumonitis from immunotherapy, or cancer progression. Recent CT showed infiltrates and nodular opacities, with some improvement but new involvement in the left upper lobe. Oxygen  levels have fluctuated, with recent drops to low 90s and fever. Previous treatments with antibiotics and steroids provided temporary relief but did not resolve symptoms. Bronchoscopy with lavage and possible biopsy is needed to further investigate the cause of symptoms. Discussed risks of bronchoscopy, including anesthesia risks and potential bleeding or lung damage, with a small risk of complications - Schedule bronchoscopy with lavage and possible biopsy for Wednesday or Thursday depending on endo availability  Stage IV renal cell carcinoma with metastases to lung, brain, bone, and chest wall Stage IV  renal cell carcinoma with metastases to lung, brain, bone, and chest wall. Currently on nivolumab  immunotherapy since February 2025. Previous treatments included Keytruda  and Cabometyx , with Cabometyx   held due to liver reaction. Recent stereotactic radiation to chest wall in February/March 2025. No current steroids.  Signature:   Gradie Ohm MD [facility] See Amion for pager  If no response to pager , please call [phone] until 7pm After 7:00 pm call Elink  [phone] 08/31/2024, 12:59 PM

## 2024-08-31 NOTE — H&P (Signed)
 History and Physical    Michael [name] FMW:[id number] DOB: Jan 19, 1966 DOA: 08/30/2024  Patient coming from: Home.  Chief Complaint: Shortness of breath.  HPI: Michael [name] is a 58 y.o. male with history of renal cell carcinoma with brain, pulmonary, bone metastasis status post chemo/radiation and resection of cerebellar metastasis with history of hypertension chronic kidney disease stage III, hypothyroidism who was admitted on August 03, 2024 for pneumonitis at that time was placed on antibiotics and also tapering dose of steroids which patient completed about 2 weeks ago states that he was feeling fine until 5 days ago when he became again short of breath on exertion.  He was not able to play tennis as usual.  He got his last dose of nivolumab  on August 23, 2024 as per the patient.  He had come to the ER on August 26, 2024 with complaints of shortness of breath at that time CT angiogram of the chest shows multifocal infiltrates and was placed on antibiotics.  Since patient is still short of breath and also had fevers at home temperatures up to 101 F patient's oncologist advised patient to come to the ER and felt that patient may need bronchoscopy.  ED Course: In the ER patient had a temperature of 101.3 F with chest x-ray showing stable multifocal infiltrates.  Patient is getting short of breath easily with exertion.  COVID and flu test on October 23 was negative.  Cultures were sent started on empiric antibiotics admitted for further workup of pneumonitis.  Labs show creatinine of 1.2 hemoglobin 12.  WBC 8.4.  Review of Systems: As per HPI, rest all negative.   Past Medical History:  Diagnosis Date   Cancer (HCC)    Renal cell carcinoma   Clear cell renal cell carcinoma (HCC)    Hypertension    Hypothyroidism    Sleep apnea    wears cpap    Past Surgical History:  Procedure Laterality Date   APPLICATION OF CRANIAL NAVIGATION N/A 10/13/2023   Procedure: APPLICATION OF CRANIAL  NAVIGATION;  Surgeon: Lanis Pupa, MD;  Location: [facility] OR;  Service: Neurosurgery;  Laterality: N/A;   CRANIOTOMY N/A 10/13/2023   Procedure: SUBOCCIPITAL   CRANIOTOMY TUMOR EXCISION;  Surgeon: Lanis Pupa, MD;  Location: [facility] OR;  Service: Neurosurgery;  Laterality: N/A;   IR RADIOLOGIST EVAL & MGMT  04/04/2021   no procedures performed   IR RADIOLOGIST EVAL & MGMT  05/08/2021   no procedures performed   IR RADIOLOGIST EVAL & MGMT  08/12/2022   IR RADIOLOGIST EVAL & MGMT  05/24/2024   LUNG BIOPSY  2021   RADIOLOGY WITH ANESTHESIA N/A 05/30/2021   Procedure: CT WITH ANESTHESIA CRYOABLATION;  Surgeon: Luverne Aran, MD;  Location: [facility];  Service: Radiology;  Laterality: N/A;   right nephrectomy Right 08/2019   TONSILLECTOMY       reports that he has never smoked. He has never used smokeless tobacco. He reports that he does not currently use alcohol . He reports that he does not use drugs.  Allergies  Allergen Reactions   Cleocin [Clindamycin] Rash   Penicillins Other (See Comments)    Unknown reaction in childhood   Tape Rash    Family History  Problem Relation Age of Onset   Liver disease Neg Hx    Colon cancer Neg Hx    Esophageal cancer Neg Hx     Prior to Admission medications   Medication Sig Start Date End Date Taking? Authorizing Provider  amLODipine  (NORVASC ) 5 MG  tablet Take 7.5 mg by mouth daily before breakfast. 04/23/21   [provider]  azithromycin (ZITHROMAX) 250 MG tablet Take 2 tablets (500 mg total) by mouth daily for 1 day, THEN 1 tablet (250 mg total) daily for 4 days. Take first 2 tablets together, then 1 every day until finished. 08/27/24 09/01/24  Ruthell Lonni FALCON, PA-C  doxycycline  (VIBRAMYCIN ) 100 MG capsule Take 1 capsule (100 mg total) by mouth 2 (two) times daily. 08/27/24   Ruthell Lonni FALCON, PA-C  guaiFENesin (MUCINEX) 600 MG 12 hr tablet Take 1 tablet (600 mg total) by mouth 2 (two) times daily as needed for cough or to  loosen phlegm. 08/05/24   Vann, Jessica U, DO  levothyroxine  (SYNTHROID ) 150 MCG tablet Take 1 tablet (150 mcg total) by mouth daily before breakfast. 07/19/24   [name], MD  montelukast  (SINGULAIR ) 10 MG tablet Take 10 mg by mouth daily before breakfast.    [provider]  ondansetron  (ZOFRAN ) 8 MG tablet Take 1 tablet (8 mg total) by mouth every 8 (eight) hours as needed for nausea or vomiting. Patient not taking: Reported on 04/27/2024 12/15/23   [name], MD  predniSONE  (DELTASONE ) 10 MG tablet 50 mg x 3 days then 40mg  x 3 days then 30mg  x 3 days then 20mg  x 3 days then 10mg  x3 days then stop 08/05/24   Vann, Jessica U, DO  tadalafil (CIALIS) 20 MG tablet Take 10-20 mg by mouth daily as needed for erectile dysfunction.    [provider]    Physical Exam: Constitutional: Moderately built and nourished. Vitals:   08/30/24 1615 08/30/24 1703 08/30/24 2330 08/30/24 2346  BP: 115/75  107/70   Pulse: (!) 101  91   Resp: 18  15   Temp: (!) 101.3 F (38.5 C) 99.8 F (37.7 C)  99.5 F (37.5 C)  TempSrc:  Oral  Oral  SpO2: 93%  93%    Eyes: Anicteric no pallor. ENMT: No discharge from the ears eyes nose or mouth. Neck: No mass felt.  No neck rigidity. Respiratory: No rhonchi or crepitations. Cardiovascular: S1-S2 heard. Abdomen: Soft nontender bowel sound present. Musculoskeletal: No edema. Skin: No rash. Neurologic: Alert awake oriented time place and person.  Moves all extremities. Psychiatric: Appears normal.  Normal affect.   Labs on Admission: I have personally reviewed following labs and imaging studies  CBC: Recent Labs  Lab 08/26/24 2301 08/27/24 0356 08/30/24 1717  WBC 8.9 5.9 8.4  NEUTROABS  --  4.2 6.4  HGB 12.5* 10.5* 12.0*  HCT 40.1 34.1* 38.0*  MCV 83.2 84.0 80.7  PLT 229 196 390   Basic Metabolic Panel: Recent Labs  Lab 08/26/24 2301 08/30/24 1717  NA 136 137  K 4.4 4.0  CL 100 105  CO2 24 18*  GLUCOSE 107* 94  BUN  14 10  CREATININE 1.37* 1.25*  CALCIUM 9.1 8.5*   GFR: Estimated Creatinine Clearance: 77.6 mL/min (A) (by C-G formula based on SCr of 1.25 mg/dL (H)). Liver Function Tests: Recent Labs  Lab 08/30/24 1717  AST 23  ALT 31  ALKPHOS 68  BILITOT 0.7  PROT 6.6  ALBUMIN 2.8*   No results for input(s): LIPASE, AMYLASE in the last 168 hours. No results for input(s): AMMONIA in the last 168 hours. Coagulation Profile: No results for input(s): INR, PROTIME in the last 168 hours. Cardiac Enzymes: No results for input(s): CKTOTAL, CKMB, CKMBINDEX, TROPONINI in the last 168 hours. BNP (last 3 results) No results  for input(s): PROBNP in the last 8760 hours. HbA1C: No results for input(s): HGBA1C in the last 72 hours. CBG: No results for input(s): GLUCAP in the last 168 hours. Lipid Profile: No results for input(s): CHOL, HDL, LDLCALC, TRIG, CHOLHDL, LDLDIRECT in the last 72 hours. Thyroid  Function Tests: No results for input(s): TSH, T4TOTAL, FREET4, T3FREE, THYROIDAB in the last 72 hours. Anemia Panel: No results for input(s): VITAMINB12, FOLATE, FERRITIN, TIBC, IRON, RETICCTPCT in the last 72 hours. Urine analysis:    Component Value Date/Time   COLORURINE AMBER (A) 08/30/2024 1717   APPEARANCEUR CLEAR 08/30/2024 1717   LABSPEC 1.025 08/30/2024 1717   PHURINE 5.0 08/30/2024 1717   GLUCOSEU NEGATIVE 08/30/2024 1717   HGBUR SMALL (A) 08/30/2024 1717   BILIRUBINUR NEGATIVE 08/30/2024 1717   KETONESUR NEGATIVE 08/30/2024 1717   PROTEINUR 30 (A) 08/30/2024 1717   NITRITE NEGATIVE 08/30/2024 1717   LEUKOCYTESUR NEGATIVE 08/30/2024 1717   Sepsis Labs: @LABRCNTIP (procalcitonin:4,lacticidven:4) ) Recent Results (from the past 240 hours)  Blood culture (routine x 2)     Status: None (Preliminary result)   Collection Time: 08/27/24 12:03 AM   Specimen: BLOOD  Result Value Ref Range Status   Specimen Description   Final     BLOOD RIGHT ANTECUBITAL Performed at [facility], [address] [place], [place] [address]    Special Requests   Final    Blood Culture adequate volume BOTTLES DRAWN AEROBIC AND ANAEROBIC Performed at [facility], [address], [place], [place] [address]    Culture   Final    NO GROWTH 3 DAYS Performed at [facility], [address], [place], [place] [address]    Report Status PENDING  Incomplete  Blood culture (routine x 2)     Status: None (Preliminary result)   Collection Time: 08/27/24 12:17 AM   Specimen: BLOOD  Result Value Ref Range Status   Specimen Description   Final    BLOOD BLOOD RIGHT HAND Performed at [facility], [address] [place], [place] [address]    Special Requests   Final    Blood Culture adequate volume BOTTLES DRAWN AEROBIC AND ANAEROBIC Performed at [facility], [address], [place], [place] [address]    Culture   Final    NO GROWTH 3 DAYS Performed at [facility], [address] [place], [place] [address]    Report Status PENDING  Incomplete  Resp panel by RT-PCR (RSV, Flu A&B, Covid) Anterior Nasal Swab     Status: None   Collection Time: 08/27/24 12:26 AM   Specimen: Anterior Nasal Swab  Result Value Ref Range Status   SARS Coronavirus 2 by RT PCR NEGATIVE NEGATIVE Final    Comment: (NOTE) SARS-CoV-2 target nucleic acids are NOT DETECTED.  The SARS-CoV-2 RNA is generally detectable in upper respiratory specimens during the acute phase of infection. The lowest concentration of SARS-CoV-2 viral copies this assay can detect is 138 copies/mL. A negative result does not preclude SARS-Cov-2 infection and should not be used as the sole basis for treatment or other patient management decisions. A negative result may occur with  improper specimen collection/handling, submission of specimen other than nasopharyngeal swab, presence of viral mutation(s)  within the areas targeted by this assay, and inadequate number of viral copies(<138 copies/mL). A negative result must be combined with clinical observations, patient history, and epidemiological information. The expected result is Negative.  Fact Sheet for Patients:  bloggercourse.com  Fact Sheet for Healthcare Providers:  seriousbroker.it  This test is no t yet approved or cleared by the [place]  FDA and  has been authorized for detection and/or diagnosis of SARS-CoV-2 by FDA under an Emergency Use Authorization (EUA). This EUA will remain  in effect (meaning this test can be used) for the duration of the COVID-19 declaration under Section 564(b)(1) of the Act, 21 U.S.C.section 360bbb-3(b)(1), unless the authorization is terminated  or revoked sooner.       Influenza A by PCR NEGATIVE NEGATIVE Final   Influenza B by PCR NEGATIVE NEGATIVE Final    Comment: (NOTE) The Xpert Xpress SARS-CoV-2/FLU/RSV plus assay is intended as an aid in the diagnosis of influenza from Nasopharyngeal swab specimens and should not be used as a sole basis for treatment. Nasal washings and aspirates are unacceptable for Xpert Xpress SARS-CoV-2/FLU/RSV testing.  Fact Sheet for Patients: bloggercourse.com  Fact Sheet for Healthcare Providers: seriousbroker.it  This test is not yet approved or cleared by the [place]  FDA and has been authorized for detection and/or diagnosis of SARS-CoV-2 by FDA under an Emergency Use Authorization (EUA). This EUA will remain in effect (meaning this test can be used) for the duration of the COVID-19 declaration under Section 564(b)(1) of the Act, 21 U.S.C. section 360bbb-3(b)(1), unless the authorization is terminated or revoked.     Resp Syncytial Virus by PCR NEGATIVE NEGATIVE Final    Comment: (NOTE) Fact Sheet for  Patients: bloggercourse.com  Fact Sheet for Healthcare Providers: seriousbroker.it  This test is not yet approved or cleared by the [place]  FDA and has been authorized for detection and/or diagnosis of SARS-CoV-2 by FDA under an Emergency Use Authorization (EUA). This EUA will remain in effect (meaning this test can be used) for the duration of the COVID-19 declaration under Section 564(b)(1) of the Act, 21 U.S.C. section 360bbb-3(b)(1), unless the authorization is terminated or revoked.  Performed at [facility], [address] [place], [place] [address]      Radiological Exams on Admission: DG Chest 2 View Result Date: 08/30/2024 EXAM: 2 VIEW(S) XRAY OF THE CHEST 08/30/2024 05:36:00 PM COMPARISON: 08/26/2024 CLINICAL HISTORY: [id number] SOB (shortness of breath) [id number]. [name] , a 58 y.o. male was evaluated in triage. Pt complains of sob. Hx of RCC undergoing chemo therapy. Pt complains of sob. FINDINGS: LUNGS AND PLEURA: Multifocal airspace disease in the bilateral upper lobes and bilateral lower lobes, not significantly changed. No pulmonary edema. No pleural effusion. No pneumothorax. HEART AND MEDIASTINUM: No acute abnormality of the cardiac and mediastinal silhouettes. BONES AND SOFT TISSUES: No acute osseous abnormality. IMPRESSION: 1. Stable Multifocal airspace disease in the bilateral upper lobes and bilateral lower lobes. 2. No pleural effusion or pneumothorax. Electronically signed by: Greig Pique MD 08/30/2024 06:07 PM EDT RP Workstation: [id number]     Assessment/Plan Principal Problem:   Pneumonitis Active Problems:   Kidney cancer, primary, with metastasis from kidney to other site [facility])   Essential hypertension    Multifocal pneumonia -    among the differentials include immune mediated Pneumonitis..  Will keep patient on empiric antibiotics for now.  Check respiratory viral panel/COVID  test/Fungitell/LDH consult pulmonologist. Metastatic renal cell carcinoma being followed by Dr. Sherrod oncologist.  Presently on nivolumab . Hypertension on amlodipine . Hypothyroidism on Synthroid . Anemia follow CBC.  Hemoglobin appears to be at baseline. Chronic kidney disease stage II creatinine at baseline.  Has had prior nephrectomy for renal cell carcinoma.  Since patient has persistent multifocal pneumonia  will need further workup and more than 2 midnight stay.   DVT prophylaxis: Lovenox. Code Status: Full code. Family Communication: Discussed with patient. Disposition Plan: Monitored bed. Consults called: Will consult pulmonologist. Admission status: Inpatient.

## 2024-08-31 NOTE — Plan of Care (Signed)

## 2024-08-31 NOTE — Consult Note (Signed)
 Date of Admission:  08/30/2024          Reason for Consult: Multifocal pneumonitis versus pneumonia   Referring Provider: Elgin Lam, MD   Assessment:  Pneumonitis from infusion or other cause vs pneumonia with atypical organism.  Renal cell carcinoma with metastases currenlty on immunotherpy with Optivio infusions and Xgeva  HTN Anemia   Plan:  DC antibacterial abx Consult CCM for bronchoscopy and BAL for AFG, fungal and bacterial cultutres, PJP PCR or Smear, Trans-bronchial biopsy.  If any empiric therapy be given I think it should be steroids ALONE We will send urine histoplassma, blastomyces ag , serum crypto ag and cocc antibodies, fungitell sent already Standard universal precations   Principal Problem:   Pneumonitis Active Problems:   Kidney cancer, primary, with metastasis from kidney to other site [facility])   Multifocal pneumonia   Essential hypertension   Anemia   Chronic kidney disease, stage II (mild)   Hypothyroidism   Scheduled Meds:  amLODipine   7.5 mg Oral Daily   enoxaparin (LOVENOX) injection  55 mg Subcutaneous Q24H   levothyroxine   150 mcg Oral QAC breakfast   montelukast   10 mg Oral Daily   Continuous Infusions: PRN Meds:.acetaminophen  **OR** acetaminophen   HPI: Michael [name] is a 58 y.o. male with history of renal cell carcinoma with metastasis to the brain lungs and sternum status post chemo and radiation and resection of cerebellar metastasis last December hypertension chronic kidney disease who is changed to a new chemotherapy regimen of Optivio infusions and Xgeva . This September after his infusion on September 15th he developed symptoms of malaise, dyspnea. He came in to ER and had CT scan showing  CT 07/2024 Chest 1. New dense airspace consolidation in the RIGHT upper lobe and RIGHT and LEFT lower lobes adjacent to osseous radiation treatment sites. Findings most consistent with significant progressive radiation pneumonitis. 2. No  mediastinal adenopathy. 3. Interval decrease in size of lytic lesion in the RIGHT aspect of the manubrium. 4. Interval decrease in size of lytic lesion at the T9 vertebral body.  PELVIS: 1. No evidence of local recurrence in the RIGHT nephrectomy bed. 2. New small focal soft tissue dilatation of the LEFT ureter without obstruction. Recommend CT urogram with contrast or MRI urogram without contrast if patient cannot receive IV contrast. Alternatively consider cystoscopy and ureteroscopy for evaluation depending on clinical concern.   He has NOT had recent radiation but there was concern that his infusion could have caused a pneumonitits and he received a course of steroids  along with rocephin  and azithromycin and then DC on doxycyline. He had improvement in his symmptoms and this improvement continued the entire time he was on a steroid taper.   He had even felt so well he had played 3 sets of tennis, played golf.    This then ended and he had another infusion of Optivio on 08/23/2024  He then developed same symptoms as before with dyspnea worsening and malaise and now a fever but no cough (none with the first episode either)  Pt seen in the ER on the 23rd and repeat CT (a CTA ) showing  Changes consistent with bony metastatic disease stable from the prior exam.   Waxing and waning infiltrates in both lungs as described above. The left upper lobe shows the most amount of worsening infiltrate.   Rounded nodular densities the largest of which lies in the right upper lobe and suspicious for metastatic disease given the clinical history.  He was again given azithromycin and doxycycline  rx and took the azithromycin alone with ho improvement in his sytmptoms.  His PCP Dr. Onita paged me yesterday and I recommended that the patient be admitted for bronchoscopy, BAL and likely trans-bronchial biopsy.  This story to me makes sense as a potential pneumonitis in response to his steroids.   It  is certainly NOT a typical bacterial infection I also am skeptical of subacute process such as nocardia, NTM or dimorphic fungal infection but they should be considered. He has travelled to [place], once to [place], [place] and [address]  We have stopped his antibacterial antibiotics (vancomycin  and cefepime)  His flu, covid and 20 pathogen respiratory panel were all negative.   Recommend consulting CCM for bronchoscopy BAL for AFB, fungal and routine cultures, PCP smear, and TBBiopsy.  I would favor an empiric coarse of steroids alone to potentially help determine if this is a pneumonitis.   I do not think this is TB.  We will send urine histoplasma antigen, blastomyces ag, serum crypto ag, serum cocci antibodies. He has a fungitell sent along with an LDH.  I have personally spent 83 minutes involved in face-to-face and non-face-to-face activities for this patient on the day of the visit. Professional time spent includes the following activities: Preparing to see the patient (review of tests), Obtaining and/or reviewing separately obtained history (admission/discharge record), Performing a medically appropriate examination and/or evaluation , Ordering medications/tests/procedures, referring and communicating with other health care professionals, Documenting clinical information in the EMR, Independently interpreting results (not separately reported), Communicating results to the patient/family/caregiver, Counseling and educating the patient/family/caregiver and Care coordination (not separately reported).   Evaluation of the patient requires complex antimicrobial therapy evaluation, counseling , isolation needs to reduce disease transmission and risk assessment and mitigation.     Review of Systems: ROS  Past Medical History:  Diagnosis Date   Cancer (HCC)    Renal cell carcinoma   Clear cell renal cell carcinoma (HCC)    Hypertension    Hypothyroidism     Sleep apnea    wears cpap    Social History   Tobacco Use   Smoking status: Never   Smokeless tobacco: Never  Vaping Use   Vaping status: Never Used  Substance Use Topics   Alcohol  use: Not Currently    Comment: occasional beer or wine 1 x a month   Drug use: Never    Family History  Problem Relation Age of Onset   Liver disease Neg Hx    Colon cancer Neg Hx    Esophageal cancer Neg Hx    Allergies  Allergen Reactions   Cleocin [Clindamycin] Rash   Penicillins Other (See Comments)    Unknown reaction in childhood   Tape Rash    Use Coban instead    OBJECTIVE: Blood pressure 118/84, pulse 77, temperature 98.2 F (36.8 C), temperature source Oral, resp. rate 16, SpO2 96%.  Physical Exam  Lab Results Lab Results  Component Value Date   WBC 7.2 08/31/2024   HGB 10.7 (L) 08/31/2024   HCT 34.5 (L) 08/31/2024   MCV 82.9 08/31/2024   PLT 358 08/31/2024    Lab Results  Component Value Date   CREATININE 1.31 (H) 08/31/2024   BUN 12 08/31/2024   NA 138 08/31/2024   K 3.9 08/31/2024   CL 105 08/31/2024   CO2 22 08/31/2024    Lab Results  Component Value Date   ALT 36  08/31/2024   AST 37 08/31/2024   ALKPHOS 62 08/31/2024   BILITOT 0.8 08/31/2024     Microbiology: Recent Results (from the past 240 hours)  Blood culture (routine x 2)     Status: None (Preliminary result)   Collection Time: 08/27/24 12:03 AM   Specimen: BLOOD  Result Value Ref Range Status   Specimen Description   Final    BLOOD RIGHT ANTECUBITAL Performed at [facility], [address] [place], [place] [address]    Special Requests   Final    Blood Culture adequate volume BOTTLES DRAWN AEROBIC AND ANAEROBIC Performed at [facility], [address] [place], [place] [address]    Culture   Final    NO GROWTH 4 DAYS Performed at [facility], [address], [place], [place] [address]    Report Status PENDING  Incomplete  Blood culture  (routine x 2)     Status: None (Preliminary result)   Collection Time: 08/27/24 12:17 AM   Specimen: BLOOD  Result Value Ref Range Status   Specimen Description   Final    BLOOD BLOOD RIGHT HAND Performed at [facility], [address] [place], [place] [address]    Special Requests   Final    Blood Culture adequate volume BOTTLES DRAWN AEROBIC AND ANAEROBIC Performed at [facility], [address], [place], [place] [address]    Culture   Final    NO GROWTH 4 DAYS Performed at [facility], [address] [place], [place] [address]    Report Status PENDING  Incomplete  Resp panel by RT-PCR (RSV, Flu A&B, Covid) Anterior Nasal Swab     Status: None   Collection Time: 08/27/24 12:26 AM   Specimen: Anterior Nasal Swab  Result Value Ref Range Status   SARS Coronavirus 2 by RT PCR NEGATIVE NEGATIVE Final    Comment: (NOTE) SARS-CoV-2 target nucleic acids are NOT DETECTED.  The SARS-CoV-2 RNA is generally detectable in upper respiratory specimens during the acute phase of infection. The lowest concentration of SARS-CoV-2 viral copies this assay can detect is 138 copies/mL. A negative result does not preclude SARS-Cov-2 infection and should not be used as the sole basis for treatment or other patient management decisions. A negative result may occur with  improper specimen collection/handling, submission of specimen other than nasopharyngeal swab, presence of viral mutation(s) within the areas targeted by this assay, and inadequate number of viral copies(<138 copies/mL). A negative result must be combined with clinical observations, patient history, and epidemiological information. The expected result is Negative.  Fact Sheet for Patients:  bloggercourse.com  Fact Sheet for Healthcare Providers:  seriousbroker.it  This test is no t yet approved or cleared by the [place]  FDA and   has been authorized for detection and/or diagnosis of SARS-CoV-2 by FDA under an Emergency Use Authorization (EUA). This EUA will remain  in effect (meaning this test can be used) for the duration of the COVID-19 declaration under Section 564(b)(1) of the Act, 21 U.S.C.section 360bbb-3(b)(1), unless the authorization is terminated  or revoked sooner.       Influenza A by PCR NEGATIVE NEGATIVE Final   Influenza B by PCR NEGATIVE NEGATIVE Final    Comment: (NOTE) The Xpert Xpress SARS-CoV-2/FLU/RSV plus assay is intended as an aid in the diagnosis of influenza from Nasopharyngeal swab specimens and should not be used as a sole basis for treatment. Nasal washings and aspirates are  unacceptable for Xpert Xpress SARS-CoV-2/FLU/RSV testing.  Fact Sheet for Patients: bloggercourse.com  Fact Sheet for Healthcare Providers: seriousbroker.it  This test is not yet approved or cleared by the [place]  FDA and has been authorized for detection and/or diagnosis of SARS-CoV-2 by FDA under an Emergency Use Authorization (EUA). This EUA will remain in effect (meaning this test can be used) for the duration of the COVID-19 declaration under Section 564(b)(1) of the Act, 21 U.S.C. section 360bbb-3(b)(1), unless the authorization is terminated or revoked.     Resp Syncytial Virus by PCR NEGATIVE NEGATIVE Final    Comment: (NOTE) Fact Sheet for Patients: bloggercourse.com  Fact Sheet for Healthcare Providers: seriousbroker.it  This test is not yet approved or cleared by the [place]  FDA and has been authorized for detection and/or diagnosis of SARS-CoV-2 by FDA under an Emergency Use Authorization (EUA). This EUA will remain in effect (meaning this test can be used) for the duration of the COVID-19 declaration under Section 564(b)(1) of the Act, 21 U.S.C. section 360bbb-3(b)(1),  unless the authorization is terminated or revoked.  Performed at [facility], [address] [place], [place] [address]   MRSA Next Gen by PCR, Nasal     Status: None   Collection Time: 08/30/24 11:22 PM   Specimen: Nasal Mucosa; Nasal Swab  Result Value Ref Range Status   MRSA by PCR Next Gen NOT DETECTED NOT DETECTED Final    Comment: (NOTE) The GeneXpert MRSA Assay (FDA approved for NASAL specimens only), is one component of a comprehensive MRSA colonization surveillance program. It is not intended to diagnose MRSA infection nor to guide or monitor treatment for MRSA infections. Test performance is not FDA approved in patients less than 22 years old. Performed at [facility], [address], [place], [place] [address]   Blood culture (routine x 2)     Status: None (Preliminary result)   Collection Time: 08/30/24 11:56 PM   Specimen: BLOOD RIGHT ARM  Result Value Ref Range Status   Specimen Description BLOOD RIGHT ARM  Final   Special Requests   Final    BOTTLES DRAWN AEROBIC AND ANAEROBIC Blood Culture adequate volume   Culture   Final    NO GROWTH < 12 HOURS Performed at [facility], [address] [place], [place] [address]    Report Status PENDING  Incomplete  Blood culture (routine x 2)     Status: None (Preliminary result)   Collection Time: 08/30/24 11:57 PM   Specimen: BLOOD LEFT ARM  Result Value Ref Range Status   Specimen Description BLOOD LEFT ARM  Final   Special Requests   Final    BOTTLES DRAWN AEROBIC AND ANAEROBIC Blood Culture adequate volume   Culture   Final    NO GROWTH < 12 HOURS Performed at [facility], [address], [place], [place] [address]    Report Status PENDING  Incomplete  Respiratory (~20 pathogens) panel by PCR     Status: None   Collection Time: 08/31/24  2:55 AM   Specimen: Nasal Mucosa; Respiratory  Result Value Ref Range Status   Adenovirus NOT DETECTED NOT DETECTED Final    Coronavirus 229E NOT DETECTED NOT DETECTED Final    Comment: (NOTE) The Coronavirus on the Respiratory Panel, DOES NOT test for the novel  Coronavirus (2019 nCoV)    Coronavirus HKU1 NOT DETECTED NOT DETECTED Final   Coronavirus NL63 NOT DETECTED NOT DETECTED Final   Coronavirus  OC43 NOT DETECTED NOT DETECTED Final   Metapneumovirus NOT DETECTED NOT DETECTED Final   Rhinovirus / Enterovirus NOT DETECTED NOT DETECTED Final   Influenza A NOT DETECTED NOT DETECTED Final   Influenza B NOT DETECTED NOT DETECTED Final   Parainfluenza Virus 1 NOT DETECTED NOT DETECTED Final   Parainfluenza Virus 2 NOT DETECTED NOT DETECTED Final   Parainfluenza Virus 3 NOT DETECTED NOT DETECTED Final   Parainfluenza Virus 4 NOT DETECTED NOT DETECTED Final   Respiratory Syncytial Virus NOT DETECTED NOT DETECTED Final   Bordetella pertussis NOT DETECTED NOT DETECTED Final   Bordetella Parapertussis NOT DETECTED NOT DETECTED Final   Chlamydophila pneumoniae NOT DETECTED NOT DETECTED Final   Mycoplasma pneumoniae NOT DETECTED NOT DETECTED Final    Comment: Performed at [facility], [address], [place], [place] [address]  SARS Coronavirus 2 by RT PCR (hospital order, performed in [facility] hospital lab) *cepheid single result test* Nasal Mucosa     Status: None   Collection Time: 08/31/24  2:55 AM   Specimen: Nasal Mucosa; Nasal Swab  Result Value Ref Range Status   SARS Coronavirus 2 by RT PCR NEGATIVE NEGATIVE Final    Comment: Performed at [facility], [address], [place], [place] [address]    Jomarie Fleeta Rothman, MD [facility] [phone] pager  08/31/2024, 11:32 AM

## 2024-08-31 NOTE — Progress Notes (Signed)
 PROGRESS NOTE    [name]  FMW:[id number] DOB: [birth year] DOA: 08/30/2024 PCP: [name], MD   Brief Narrative: Michael [name] is a 57 y.o. male with a history of re.  Patient presented secondary to worsening shortness of breath. Patient has had a month history of pulmonary symptoms with recent admission for pneumonia versus pneumonitis treated with antibiotics and steroids. Patient now with recurrent symptoms after completion of steroids but also now with fever. Empiric antibiotics started on admission.   Assessment/Plan:  Multifocal pneumonia Present on admission. Patient with symptoms of dyspnea on exertion. Concern for possible viral vs bacterial vs pneumonitis etiology for pneumonia. Patient started empirically on antibiotics. Blood cultures obtained on admission. -Continue Vancomycin  and Cefepime -Follow-up blood cultures -ID recommendations -Pulmonology consultation for consideration of bronchoscopy per ID recommendations  Metastatic renal cell carcinoma Brain metastasis Patient is s/p right nephrectomy, immunotherapy, cryoablation. Patient noted to have brain metastasis diagnosed in 2024 and started on SRS followed by craniotomy with resection. Currently on immunotherapy and follows with Dr. Sherrod.  Hypothyroidism -Continue Synthroid   Normocytic anemia Baseline hemoglobin appears to be around 12-13, with recent hemoglobin down to 10.5 days prior to admission. Hemoglobin of 12.0 with mild decrease to 10.7 after IV fluids. No evidence of bleeding.  -CBC in AM  CKD stage II Stable.   DVT prophylaxis: Lovenox Code Status:   Code Status: Full Code Family Communication: Wife at bedside Disposition Plan: Discharge pending ongoing workup/specialist recommendations   Consultants:  Infectious disease Pulmonology  Procedures:  None  Antimicrobials: Vancomycin  Cefepime    Subjective: No new issues this morning.  Objective: BP 124/74   Pulse 73    Temp 98.1 F (36.7 C)   Resp (!) 23   SpO2 95%   Examination:  General exam: Appears calm and comfortable. Respiratory system: Clear to auscultation. Respiratory effort normal. Cardiovascular system: S1 & S2 heard, RRR. No murmur. Gastrointestinal system: Abdomen is nondistended, soft and nontender. Normal bowel sounds heard. Central nervous system: Alert and oriented. No focal neurological deficits. Musculoskeletal: No edema. No calf tenderness Psychiatry: Judgement and insight appear normal. Mood & affect appropriate.    Data Reviewed: I have personally reviewed following labs and imaging studies   Last CBC Lab Results  Component Value Date   WBC 7.2 08/31/2024   HGB 10.7 (L) 08/31/2024   HCT 34.5 (L) 08/31/2024   MCV 82.9 08/31/2024   MCH 25.7 (L) 08/31/2024   RDW 18.2 (H) 08/31/2024   PLT 358 08/31/2024     Last metabolic panel Lab Results  Component Value Date   GLUCOSE 120 (H) 08/31/2024   NA 138 08/31/2024   K 3.9 08/31/2024   CL 105 08/31/2024   CO2 22 08/31/2024   BUN 12 08/31/2024   CREATININE 1.31 (H) 08/31/2024   GFRNONAA >60 08/31/2024   CALCIUM 8.3 (L) 08/31/2024   PHOS 2.2 (L) 08/04/2024   PROT 5.8 (L) 08/31/2024   ALBUMIN 2.5 (L) 08/31/2024   BILITOT 0.8 08/31/2024   ALKPHOS 62 08/31/2024   AST 37 08/31/2024   ALT 36 08/31/2024   ANIONGAP 11 08/31/2024     Creatinine Clearance: Estimated Creatinine Clearance: 74 mL/min (A) (by C-G formula based on SCr of 1.31 mg/dL (H)).  Recent Results (from the past 240 hours)  Blood culture (routine x 2)     Status: None (Preliminary result)   Collection Time: 08/27/24 12:03 AM   Specimen: BLOOD  Result Value Ref Range Status   Specimen Description   Final  BLOOD RIGHT ANTECUBITAL Performed at [facility], [address] [place], [place] [address]    Special Requests   Final    Blood Culture adequate volume BOTTLES DRAWN AEROBIC AND ANAEROBIC Performed at [facility], [address] [place], [place] [address]    Culture   Final    NO GROWTH 3 DAYS Performed at [facility], [address], [place], [place] [address]    Report Status PENDING  Incomplete  Blood culture (routine x 2)     Status: None (Preliminary result)   Collection Time: 08/27/24 12:17 AM   Specimen: BLOOD  Result Value Ref Range Status   Specimen Description   Final    BLOOD BLOOD RIGHT HAND Performed at [facility], [address], [place], [place] [address]    Special Requests   Final    Blood Culture adequate volume BOTTLES DRAWN AEROBIC AND ANAEROBIC Performed at [facility], [address] [place], [place] [address]    Culture   Final    NO GROWTH 3 DAYS Performed at [facility], [address], [place], [place] [address]    Report Status PENDING  Incomplete  Resp panel by RT-PCR (RSV, Flu A&B, Covid) Anterior Nasal Swab     Status: None   Collection Time: 08/27/24 12:26 AM   Specimen: Anterior Nasal Swab  Result Value Ref Range Status   SARS Coronavirus 2 by RT PCR NEGATIVE NEGATIVE Final    Comment: (NOTE) SARS-CoV-2 target nucleic acids are NOT DETECTED.  The SARS-CoV-2 RNA is generally detectable in upper respiratory specimens during the acute phase of infection. The lowest concentration of SARS-CoV-2 viral copies this assay can detect is 138 copies/mL. A negative result does not preclude SARS-Cov-2 infection and should not be used as the sole basis for treatment or other patient management decisions. A negative result may occur with  improper specimen collection/handling, submission of specimen other than nasopharyngeal swab, presence of viral mutation(s) within the areas targeted by this assay, and inadequate number of viral copies(<138 copies/mL). A negative result must be combined with clinical observations, patient history, and epidemiological information. The expected result is  Negative.  Fact Sheet for Patients:  bloggercourse.com  Fact Sheet for Healthcare Providers:  seriousbroker.it  This test is no t yet approved or cleared by the [place]  FDA and  has been authorized for detection and/or diagnosis of SARS-CoV-2 by FDA under an Emergency Use Authorization (EUA). This EUA will remain  in effect (meaning this test can be used) for the duration of the COVID-19 declaration under Section 564(b)(1) of the Act, 21 U.S.C.section 360bbb-3(b)(1), unless the authorization is terminated  or revoked sooner.       Influenza A by PCR NEGATIVE NEGATIVE Final   Influenza B by PCR NEGATIVE NEGATIVE Final    Comment: (NOTE) The Xpert Xpress SARS-CoV-2/FLU/RSV plus assay is intended as an aid in the diagnosis of influenza from Nasopharyngeal swab specimens and should not be used as a sole basis for treatment. Nasal washings and aspirates are unacceptable for Xpert Xpress SARS-CoV-2/FLU/RSV testing.  Fact Sheet for Patients: bloggercourse.com  Fact Sheet for Healthcare Providers: seriousbroker.it  This test is not yet approved or cleared by the [place]  FDA and has been authorized for detection and/or diagnosis of SARS-CoV-2 by FDA under an Emergency Use Authorization (EUA). This EUA will remain in effect (meaning this test can be used) for the duration of the COVID-19  declaration under Section 564(b)(1) of the Act, 21 U.S.C. section 360bbb-3(b)(1), unless the authorization is terminated or revoked.     Resp Syncytial Virus by PCR NEGATIVE NEGATIVE Final    Comment: (NOTE) Fact Sheet for Patients: bloggercourse.com  Fact Sheet for Healthcare Providers: seriousbroker.it  This test is not yet approved or cleared by the [place]  FDA and has been authorized for detection and/or diagnosis of  SARS-CoV-2 by FDA under an Emergency Use Authorization (EUA). This EUA will remain in effect (meaning this test can be used) for the duration of the COVID-19 declaration under Section 564(b)(1) of the Act, 21 U.S.C. section 360bbb-3(b)(1), unless the authorization is terminated or revoked.  Performed at [facility], [address] [place], [place] [address]   MRSA Next Gen by PCR, Nasal     Status: None   Collection Time: 08/30/24 11:22 PM   Specimen: Nasal Mucosa; Nasal Swab  Result Value Ref Range Status   MRSA by PCR Next Gen NOT DETECTED NOT DETECTED Final    Comment: (NOTE) The GeneXpert MRSA Assay (FDA approved for NASAL specimens only), is one component of a comprehensive MRSA colonization surveillance program. It is not intended to diagnose MRSA infection nor to guide or monitor treatment for MRSA infections. Test performance is not FDA approved in patients less than 80 years old. Performed at [facility], [address] [place], [place] [address]   Respiratory (~20 pathogens) panel by PCR     Status: None   Collection Time: 08/31/24  2:55 AM   Specimen: Nasal Mucosa; Respiratory  Result Value Ref Range Status   Adenovirus NOT DETECTED NOT DETECTED Final   Coronavirus 229E NOT DETECTED NOT DETECTED Final    Comment: (NOTE) The Coronavirus on the Respiratory Panel, DOES NOT test for the novel  Coronavirus (2019 nCoV)    Coronavirus HKU1 NOT DETECTED NOT DETECTED Final   Coronavirus NL63 NOT DETECTED NOT DETECTED Final   Coronavirus OC43 NOT DETECTED NOT DETECTED Final   Metapneumovirus NOT DETECTED NOT DETECTED Final   Rhinovirus / Enterovirus NOT DETECTED NOT DETECTED Final   Influenza A NOT DETECTED NOT DETECTED Final   Influenza B NOT DETECTED NOT DETECTED Final   Parainfluenza Virus 1 NOT DETECTED NOT DETECTED Final   Parainfluenza Virus 2 NOT DETECTED NOT DETECTED Final   Parainfluenza Virus 3 NOT DETECTED NOT DETECTED Final    Parainfluenza Virus 4 NOT DETECTED NOT DETECTED Final   Respiratory Syncytial Virus NOT DETECTED NOT DETECTED Final   Bordetella pertussis NOT DETECTED NOT DETECTED Final   Bordetella Parapertussis NOT DETECTED NOT DETECTED Final   Chlamydophila pneumoniae NOT DETECTED NOT DETECTED Final   Mycoplasma pneumoniae NOT DETECTED NOT DETECTED Final    Comment: Performed at [facility], [address], [place], [place] [address]  SARS Coronavirus 2 by RT PCR (hospital order, performed in [facility] hospital lab) *cepheid single result test* Nasal Mucosa     Status: None   Collection Time: 08/31/24  2:55 AM   Specimen: Nasal Mucosa; Nasal Swab  Result Value Ref Range Status   SARS Coronavirus 2 by RT PCR NEGATIVE NEGATIVE Final    Comment: Performed at [facility], [address] [place], [place] [address]      Radiology Studies: DG Chest 2 View Result Date: 08/30/2024 EXAM: 2 VIEW(S) XRAY OF THE CHEST 08/30/2024 05:36:00 PM COMPARISON: 08/26/2024 CLINICAL HISTORY: [id number] SOB (shortness of breath) [id number]. [name] , a 58 y.o.  male was evaluated in triage. Pt complains of sob. Hx of RCC undergoing chemo therapy. Pt complains of sob. FINDINGS: LUNGS AND PLEURA: Multifocal airspace disease in the bilateral upper lobes and bilateral lower lobes, not significantly changed. No pulmonary edema. No pleural effusion. No pneumothorax. HEART AND MEDIASTINUM: No acute abnormality of the cardiac and mediastinal silhouettes. BONES AND SOFT TISSUES: No acute osseous abnormality. IMPRESSION: 1. Stable Multifocal airspace disease in the bilateral upper lobes and bilateral lower lobes. 2. No pleural effusion or pneumothorax. Electronically signed by: Greig Pique MD 08/30/2024 06:07 PM EDT RP Workstation: [id number]      LOS: 0 days    Elgin Lam, MD [facility] 08/31/2024, 7:59 AM   If 7PM-7AM, please contact night-coverage [email or web address]

## 2024-08-31 NOTE — Progress Notes (Signed)
 Pt refused to take lovenox as ordered, explained purpose of, pt states he plans to move around, walk in the room and hallway and be OOB. States he was here recently for three days and did not have the lovenox, nor other med for blood clot prevention, not SCDs or TEDs.  Secure chat message sent to Dr Briana.   Crystal Scarberry,RN SWOT

## 2024-09-01 ENCOUNTER — Inpatient Hospital Stay (HOSPITAL_COMMUNITY): Admitting: Anesthesiology

## 2024-09-01 ENCOUNTER — Inpatient Hospital Stay (HOSPITAL_COMMUNITY)

## 2024-09-01 ENCOUNTER — Encounter (HOSPITAL_COMMUNITY): Payer: Self-pay | Admitting: Internal Medicine

## 2024-09-01 ENCOUNTER — Encounter (HOSPITAL_COMMUNITY)
Admission: EM | Disposition: A | Discharge status: Home / Self Care | Payer: Self-pay | Source: Home / Self Care | Attending: Internal Medicine

## 2024-09-01 DIAGNOSIS — R911 Solitary pulmonary nodule: Secondary | ICD-10-CM | POA: Diagnosis not present

## 2024-09-01 DIAGNOSIS — J984 Other disorders of lung: Secondary | ICD-10-CM | POA: Diagnosis not present

## 2024-09-01 DIAGNOSIS — J90 Pleural effusion, not elsewhere classified: Secondary | ICD-10-CM | POA: Diagnosis not present

## 2024-09-01 DIAGNOSIS — J188 Other pneumonia, unspecified organism: Secondary | ICD-10-CM | POA: Diagnosis not present

## 2024-09-01 DIAGNOSIS — R918 Other nonspecific abnormal finding of lung field: Secondary | ICD-10-CM | POA: Diagnosis not present

## 2024-09-01 DIAGNOSIS — Z48813 Encounter for surgical aftercare following surgery on the respiratory system: Secondary | ICD-10-CM | POA: Diagnosis not present

## 2024-09-01 HISTORY — PX: VIDEO BRONCHOSCOPY: SHX5072

## 2024-09-01 LAB — CULTURE, BLOOD (ROUTINE X 2)
Culture: NO GROWTH
Culture: NO GROWTH
Special Requests: ADEQUATE
Special Requests: ADEQUATE

## 2024-09-01 LAB — BODY FLUID CELL COUNT WITH DIFFERENTIAL
Eos, Fluid: 0 %
Eos, Fluid: 1 %
Lymphs, Fluid: 25 %
Lymphs, Fluid: 37 %
Monocyte-Macrophage-Serous Fluid: 22 % — ABNORMAL LOW (ref 50–90)
Monocyte-Macrophage-Serous Fluid: 33 % — ABNORMAL LOW (ref 50–90)
Neutrophil Count, Fluid: 37 % — ABNORMAL HIGH (ref 0–25)
Neutrophil Count, Fluid: 41 % — ABNORMAL HIGH (ref 0–25)
Total Nucleated Cell Count, Fluid: 128 uL (ref 0–1000)
Total Nucleated Cell Count, Fluid: 88 uL (ref 0–1000)

## 2024-09-01 LAB — GLUCOSE, CAPILLARY
Glucose-Capillary: 199 mg/dL — ABNORMAL HIGH (ref 70–99)
Glucose-Capillary: 195 mg/dL — ABNORMAL HIGH (ref 70–99)

## 2024-09-01 LAB — CRYPTOCOCCAL ANTIGEN: Crypto Ag: NEGATIVE

## 2024-09-01 SURGERY — BRONCHOSCOPY, WITH FLUOROSCOPY
Anesthesia: General | Laterality: Bilateral

## 2024-09-01 MED ORDER — IPRATROPIUM-ALBUTEROL 0.5-2.5 (3) MG/3ML IN SOLN
3.0000 mL | RESPIRATORY_TRACT | Status: DC
Start: 2024-09-01 — End: 2024-09-02
  Administered 2024-09-01 – 2024-09-02 (×5): 3 mL via RESPIRATORY_TRACT
  Filled 2024-09-01 (×6): qty 3

## 2024-09-01 MED ORDER — CHLORHEXIDINE GLUCONATE CLOTH 2 % EX PADS
6.0000 | MEDICATED_PAD | Freq: Every day | CUTANEOUS | Status: DC
  Administered 2024-09-01: 6 via TOPICAL

## 2024-09-01 MED ORDER — LACTATED RINGERS IV SOLN: INTRAVENOUS | Status: DC | PRN

## 2024-09-01 MED ORDER — LIDOCAINE 2% (20 MG/ML) 5 ML SYRINGE
INTRAMUSCULAR | Status: DC | PRN
  Administered 2024-09-01: 100 mg via INTRAVENOUS

## 2024-09-01 MED ORDER — PROPOFOL 500 MG/50ML IV EMUL
INTRAVENOUS | Status: DC | PRN
  Administered 2024-09-01: 170 ug/kg/min via INTRAVENOUS

## 2024-09-01 MED ORDER — CHLORHEXIDINE GLUCONATE 0.12 % MT SOLN
OROMUCOSAL | Status: AC
  Filled 2024-09-01: qty 15

## 2024-09-01 MED ORDER — CHLORHEXIDINE GLUCONATE 0.12 % MT SOLN
15.0000 mL | Freq: Once | OROMUCOSAL | Status: AC
  Administered 2024-09-01: 15 mL via OROMUCOSAL

## 2024-09-01 MED ORDER — IPRATROPIUM-ALBUTEROL 0.5-2.5 (3) MG/3ML IN SOLN
RESPIRATORY_TRACT | Status: AC
  Filled 2024-09-01: qty 3

## 2024-09-01 MED ORDER — ROCURONIUM BROMIDE 10 MG/ML (PF) SYRINGE
PREFILLED_SYRINGE | INTRAVENOUS | Status: DC | PRN
  Administered 2024-09-01: 40 mg via INTRAVENOUS

## 2024-09-01 MED ORDER — IPRATROPIUM-ALBUTEROL 0.5-2.5 (3) MG/3ML IN SOLN
3.0000 mL | Freq: Once | RESPIRATORY_TRACT | Status: AC
  Administered 2024-09-01: 3 mL via RESPIRATORY_TRACT

## 2024-09-01 MED ORDER — ONDANSETRON HCL 4 MG/2ML IJ SOLN
INTRAMUSCULAR | Status: DC | PRN
  Administered 2024-09-01: 4 mg via INTRAVENOUS

## 2024-09-01 MED ORDER — SUGAMMADEX SODIUM 200 MG/2ML IV SOLN
INTRAVENOUS | Status: DC | PRN
Start: 2024-09-01 — End: 2024-09-01
  Administered 2024-09-01: 175 mg via INTRAVENOUS
  Administered 2024-09-01: 225 mg via INTRAVENOUS

## 2024-09-01 MED ORDER — PROPOFOL 10 MG/ML IV BOLUS
INTRAVENOUS | Status: DC | PRN
Start: 2024-09-01 — End: 2024-09-01
  Administered 2024-09-01: 180 mg via INTRAVENOUS

## 2024-09-01 MED ORDER — METHYLPREDNISOLONE SODIUM SUCC 125 MG IJ SOLR
80.0000 mg | INTRAMUSCULAR | Status: DC
  Administered 2024-09-01: 80 mg via INTRAVENOUS
  Filled 2024-09-01: qty 2

## 2024-09-01 MED ORDER — SUCCINYLCHOLINE CHLORIDE 200 MG/10ML IV SOSY
PREFILLED_SYRINGE | INTRAVENOUS | Status: DC | PRN
  Administered 2024-09-01: 120 mg via INTRAVENOUS

## 2024-09-01 MED ORDER — ALBUTEROL SULFATE (2.5 MG/3ML) 0.083% IN NEBU: 2.5000 mg | INHALATION_SOLUTION | Freq: Once | RESPIRATORY_TRACT | Status: DC

## 2024-09-01 MED ORDER — DEXAMETHASONE SOD PHOSPHATE PF 10 MG/ML IJ SOLN
INTRAMUSCULAR | Status: DC | PRN
  Administered 2024-09-01: 10 mg via INTRAVENOUS

## 2024-09-01 MED ORDER — PHENYLEPHRINE 80 MCG/ML (10ML) SYRINGE FOR IV PUSH (FOR BLOOD PRESSURE SUPPORT)
PREFILLED_SYRINGE | INTRAVENOUS | Status: DC | PRN
  Administered 2024-09-01 (×5): 160 ug via INTRAVENOUS

## 2024-09-01 NOTE — Anesthesia Preprocedure Evaluation (Addendum)
 Anesthesia Evaluation  Patient identified by MRN, date of birth, ID band Patient awake    Reviewed: Allergy & Precautions, NPO status , Patient's Chart, lab work & pertinent test results  History of Anesthesia Complications Negative for: history of anesthetic complications  Airway Mallampati: I  TM Distance: >3 FB Neck ROM: Full    Dental  (+) Teeth Intact, Dental Advisory Given   Pulmonary sleep apnea and Continuous Positive Airway Pressure Ventilation , pneumonia, unresolved    + decreased breath sounds      Cardiovascular hypertension, Pt. on medications  Rhythm:Regular Rate:Normal     Neuro/Psych    GI/Hepatic   Endo/Other  Hypothyroidism    Renal/GU Renal disease     Musculoskeletal   Abdominal   Peds  Hematology  (+) Blood dyscrasia, anemia   Anesthesia Other Findings   Reproductive/Obstetrics                              Anesthesia Physical Anesthesia Plan  ASA: 3  Anesthesia Plan: General   Post-op Pain Management:    Induction: Intravenous  PONV Risk Score and Plan: 2 and Ondansetron , Propofol  infusion and Treatment may vary due to age or medical condition  Airway Management Planned: Oral ETT  Additional Equipment:   Intra-op Plan:   Post-operative Plan: Extubation in OR  Informed Consent:      Dental advisory given  Plan Discussed with: CRNA and Surgeon  Anesthesia Plan Comments:         Anesthesia Quick Evaluation

## 2024-09-01 NOTE — Anesthesia Procedure Notes (Signed)
 Procedure Name: Intubation Date/Time: 09/01/2024 12:47 PM  Performed by: Najae Filsaime J, CRNAPre-anesthesia Checklist: Patient identified, Emergency Drugs available, Suction available and Patient being monitored Patient Re-evaluated:Patient Re-evaluated prior to induction Oxygen  Delivery Method: Circle System Utilized Preoxygenation: Pre-oxygenation with 100% oxygen  Induction Type: IV induction Ventilation: Mask ventilation without difficulty and Oral airway inserted - appropriate to patient size Laryngoscope Size: Glidescope and 4 Grade View: Grade I Tube type: Oral Tube size: 7.5 mm Number of attempts: 1 Airway Equipment and Method: Stylet and Oral airway Placement Confirmation: ETT inserted through vocal cords under direct vision, positive ETCO2 and breath sounds checked- equal and bilateral Secured at: 23 cm Tube secured with: Tape Dental Injury: Teeth and Oropharynx as per pre-operative assessment

## 2024-09-01 NOTE — Progress Notes (Signed)
 PCCM interval note  Patient desaturated to 70s postprocedure.  Chest x-ray reviewed with upper lobe infiltrates.  No pneumothorax Sounds rhonchorous and wheezy on auscultation Will give DuoNebs, IV Solu-Medrol  Observe in ICU overnight Start BiPAP as has history of OSA  Wife and patient updated  Lonna Coder MD [facility] See Amion for pager  If no response to pager , please call [phone] until 7pm After 7:00 pm call Elink  [phone] 09/01/2024, 2:28 PM

## 2024-09-01 NOTE — Plan of Care (Signed)
   Problem: Education: Goal: Knowledge of General Education information will improve Description: Including pain rating scale, medication(s)/side effects and non-pharmacologic comfort measures Outcome: Progressing   Problem: Clinical Measurements: Goal: Ability to maintain clinical measurements within normal limits will improve Outcome: Progressing

## 2024-09-01 NOTE — Progress Notes (Signed)
 NAME:  Michael [name], MRN:  [id number], DOB:  1966-04-20, LOS: 1 ADMISSION DATE:  08/30/2024, CONSULTATION DATE:  08/31/2024 REFERRING MD:  JONELLE Copa MD, CHIEF COMPLAINT:  Acute resp failure, dyspnea   History of Present Illness:   The patient is a 58 year old with stage 4 renal cell cancer who presents with persistent shortness of breath.   Dyspnea and hypoxemia - Persistent shortness of breath for approximately one month, unresponsive to antibiotics and steroids - Hospitalized in early October for hypoxemia with oxygen  saturation in the 80s - Recent oxygen  saturation fluctuating, dropping to the low 90s - Fever associated with episodes of hypoxemia - Shortness of breath improved temporarily with steroids but never fully resolved - Increased difficulty breathing during cold weather - No cough or phlegm production  Oncologic history and treatment - Stage IV renal cell carcinoma - Status post right nephrectomy - History of brain tumor resected in December - Chest and spine tumors treated with stereotactic radiation - Currently receiving nivolumab  - Cabometyx  discontinued due to liver toxicity  Current medications and interventions - Tapering course of steroids - Recent courses of antibiotics including azithromycin (Z-Pak) and possibly doxycycline  - Oxygen  therapy administered during hospitalization  Functional status - Engages in activities such as golf and tennis - Experiences increased dyspnea with exertion, particularly in cold weather    Pertinent  Medical History    has a past medical history of Cancer (HCC), Clear cell renal cell carcinoma (HCC), Hypertension, Hypothyroidism, and Sleep apnea.   Significant Hospital Events: Including procedures, antibiotic start and stop dates in addition to other pertinent events     Interim History / Subjective:   Feels well. No issues  Objective    Blood pressure 108/75, pulse 81, temperature 98.2 F (36.8 C), resp. rate 18,  SpO2 94%.       No intake or output data in the 24 hours ending 09/01/24 1017 There were no vitals filed for this visit.  Examination: Gen:      No acute distress HEENT:  EOMI, sclera anicteric Neck:     No masses; no thyromegaly Lungs:    Clear to auscultation bilaterally; normal respiratory effort CV:         Regular rate and rhythm; no murmurs Abd:      + bowel sounds; soft, non-tender; no palpable masses, no distension Ext:    No edema; adequate peripheral perfusion Neuro: alert and oriented x 3 Psych: normal mood and affect   Resolved problem list   Assessment and Plan  Shortness of breath with pulmonary infiltrates and nodular opacities Persistent shortness of breath for about a month with pulmonary infiltrates and nodular opacities in the upper lobes. Differential diagnosis includes pneumonia, pneumonitis from immunotherapy, or cancer progression. Recent CT showed infiltrates and nodular opacities, with some improvement but new involvement in the left upper lobe. Oxygen  levels have fluctuated, with recent drops to low 90s and fever. Previous treatments with antibiotics and steroids provided temporary relief but did not resolve symptoms. Bronchoscopy with lavage and possible biopsy is needed to further investigate the cause of symptoms. Discussed risks of bronchoscopy, including anesthesia risks and potential bleeding or lung damage, with a small risk of complications - Scheduled for bronchoscopy with lavage and possible brushings and biopsy on 10/29  Stage IV renal cell carcinoma with metastases to lung, brain, bone, and chest wall Stage IV renal cell carcinoma with metastases to lung, brain, bone, and chest wall. Currently on nivolumab  immunotherapy since February 2025. Previous  treatments included Keytruda  and Cabometyx , with Cabometyx  held due to liver reaction. Recent stereotactic radiation to chest wall in February/March 2025. No current steroids.  Signature:   Gregori Abril  MD [facility] See Amion for pager  If no response to pager , please call [phone] until 7pm After 7:00 pm call Elink  [phone] 09/01/2024, 10:17 AM

## 2024-09-01 NOTE — Anesthesia Postprocedure Evaluation (Signed)
 Anesthesia Post Note  Patient: Michael [name]  Procedure(s) Performed: BRONCHOSCOPY, WITH FLUOROSCOPY (Bilateral)     Patient location during evaluation: ICU Anesthesia Type: General Level of consciousness: awake Pain management: pain level controlled Vital Signs Assessment: post-procedure vital signs reviewed and stable Respiratory status: non-rebreather facemask Cardiovascular status: blood pressure returned to baseline Postop Assessment: no apparent nausea or vomiting Anesthetic complications: no Comments: Persistent hypoxia following BAL requiring 8 L/min of O2 via NRB face mask. Discussed with pulmonology team need for high flow oxygen  - agree with plan to admit to MICU for further observation given respiratory status.    No notable events documented.  Last Vitals:  Vitals:   09/01/24 0801 09/01/24 1210  BP: 108/75 109/74  Pulse: 81 87  Resp: 18 (!) 22  Temp: 36.8 C 36.6 C  SpO2: 94% 93%    Last Pain:  Vitals:   09/01/24 1210  TempSrc: Oral  PainSc: 0-No pain                 Michael [name]

## 2024-09-01 NOTE — Progress Notes (Signed)
 PROGRESS NOTE  Michael [name]  DOB: May 13, 1966  PCP: [name], MD FMW:[id number]  DOA: 08/30/2024  LOS: 1 day  Hospital Day: 3  Subjective: Patient was seen and examined this morning. Pleasant middle-aged Caucasian male.  Sitting up in recliner.  Not in distress.  Wife at bedside. Breathing on room air.  No cough on deep breathing.  Has mild bibasilar crackles on auscultation  Brief narrative: Michael [name] is a 58 y.o. male with PMH significant for renal cell cancer with metastasis to brain, lungs, bone s/p chemo/radiation, resection of cerebellar metastasis, follows up with Dr. Sherrod, currently on immunotherapy with Opdivo  infusion and Zytiga; Also with h/o HTN, CKD, OSA, hypothyroidism. Most recently hospitalized on 9/30 -10/2 for pneumonitis, placed on antibiotics and 2 weeks tapering course of steroids. Patient felt good for about 10 days. 10/20, he got his last dose of Opdivo  infusion. He has not gradually started to feel short of breath again.  He was not able to play tennis as usual. 10/23, came to the ED. COVID and flu test were negative.  CT chest showed multifocal infiltrates, placed on antibiotics and discharged home.  At home, he continued to feel short of breath, also developed a fever of 101 and hence recommended by his oncologist to come to the ED on 10/27.  In the ED, patient had fever of 101.3 Labs with WC count 8.4, hemoglobin 12, creatinine 1 point Chest x-ray showed stable multifocal infiltrates Culture sent Started on empiric antibiotics Admitted to [facility] Pulmonology and ID were consulted  Assessment and plan: Multifocal pneumonia Presented with progressive shortness of breath, fever in the setting of cancer with metastasis under immunotherapy. Initially had a fever of 101.3.  No recurrence after that. WBC count, lactic acid has remained normal throughout. Antibiotics discontinued by ID.  ID sent studies for histoplasma, Blastomyces antigen, serum crypto  antigen, cocci antibodies and fungitell Pulmonology plans for bronchoscopy today. Recent Labs  Lab 08/26/24 2301 08/26/24 2323 08/27/24 0356 08/30/24 1717 08/30/24 1732 08/31/24 0257  WBC 8.9  --  5.9 8.4  --  7.2  LATICACIDVEN  --  1.5  --   --  0.8  --    Renal cell carcinoma with metastasis  metastasis to brain, lungs, bone s/p chemo/radiation, resection of cerebellar metastasis follows up with Dr. Sherrod currently on immunotherapy with Opdivo  infusion and Zytiga.  Hypertension  Currently controlled on amlodipine  7.5 mg daily  Hypothyroidism  Continue Synthroid   CKD 2 Creatinine at baseline Recent Labs    04/29/24 1505 05/13/24 1417 06/11/24 1306 07/19/24 0806 08/03/24 1046 08/04/24 0541 08/23/24 1410 08/26/24 2301 08/30/24 1717 08/31/24 0257  BUN 12 14 16 11 9 18 18 14 10 12   CREATININE 1.33* 1.12 1.22 1.30* 1.25* 1.13 1.23 1.37* 1.25* 1.31*  CO2 28 26 27 26 22  19* 26 24 18* 22   Anemia of chronic disease Hemoglobin slightly low but above 10.  No active bleeding.  Continue monitor Recent Labs    08/23/24 1410 08/26/24 2301 08/27/24 0356 08/30/24 1717 08/31/24 0257  HGB 13.6 12.5* 10.5* 12.0* 10.7*  MCV 80.2 83.2 84.0 80.7 82.9   Mobility: Encourage ambulation PT Orders:   PT Follow up Rec:    Goals of care   Code Status: Full Code     DVT prophylaxis: Lovenox subcu    Antimicrobials: None currently Fluid: None Consultants: Pulmonology, ID Family Communication: Wife at bedside  Status: Patient Level of care:  Med-Surg   Patient is from: Home Needs to  continue in-hospital care: Pending bronchoscopy and workup today Anticipated d/c to: Hopefully home tomorrow if respiratory status stays good    Diet:  Diet Order             Diet NPO time specified  Diet effective midnight                   Scheduled Meds:  [MAR Hold] amLODipine   7.5 mg Oral Daily   [MAR Hold] enoxaparin (LOVENOX) injection  55 mg Subcutaneous Q24H    [MAR Hold] levothyroxine   150 mcg Oral QAC breakfast   [MAR Hold] montelukast   10 mg Oral Daily    PRN meds: [MAR Hold] acetaminophen  **OR** [MAR Hold] acetaminophen    Infusions:    Antimicrobials: Anti-infectives (From admission, onward)    Start     Dose/Rate Route Frequency Ordered Stop   08/31/24 1600  vancomycin  (VANCOREADY) IVPB 750 mg/150 mL  Status:  Discontinued        750 mg 150 mL/hr over 60 Minutes Intravenous Every 12 hours 08/31/24 0141 08/31/24 0931   08/31/24 0800  ceFEPIme (MAXIPIME) 2 g in sodium chloride  0.9 % 100 mL IVPB  Status:  Discontinued        2 g 200 mL/hr over 30 Minutes Intravenous Every 8 hours 08/31/24 0141 08/31/24 0931   08/30/24 2330  vancomycin  (VANCOREADY) IVPB 2000 mg/400 mL        2,000 mg 200 mL/hr over 120 Minutes Intravenous  Once 08/30/24 2322 08/31/24 0442   08/30/24 2330  ceFEPIme (MAXIPIME) 2 g in sodium chloride  0.9 % 100 mL IVPB        2 g 200 mL/hr over 30 Minutes Intravenous  Once 08/30/24 2322 08/31/24 0147       Objective: Vitals:   09/01/24 0801 09/01/24 1210  BP: 108/75 109/74  Pulse: 81 87  Resp: 18 (!) 22  Temp: 98.2 F (36.8 C) 97.9 F (36.6 C)  SpO2: 94% 93%    Intake/Output Summary (Last 24 hours) at 09/01/2024 1340 Last data filed at 09/01/2024 0900 Gross per 24 hour  Intake 0 ml  Output --  Net 0 ml   Filed Weights   09/01/24 1210  Weight: 111.1 kg   Weight change:  Body mass index is 39.54 kg/m.   Physical Exam: General exam: Pleasant, middle-aged Caucasian male.  Not in distress Skin: No rashes, lesions or ulcers. HEENT: Atraumatic, normocephalic, no obvious bleeding Lungs: Mild bibasilar crackles CVS: S1, S2, no murmur,   GI/Abd: Soft, nontender, nondistended, bowel sound present,   CNS: Alert, awake, oriented x 3 Psychiatry: Mood appropriate Extremities: No pedal edema, no calf tenderness,   Data Review: I have personally reviewed the laboratory data and studies available.  F/u labs  ordered Unresulted Labs (From admission, onward)     Start     Ordered   09/07/24 0500  Creatinine, serum  (enoxaparin (LOVENOX)    CrCl >/= 30 ml/min)  Weekly,   R     Comments: while on enoxaparin therapy    08/31/24 0112   09/01/24 1336  Aspergillus Ag, BAL/Serum  Once,   R       Comments: Right upper lobe BAL    09/01/24 1336   09/01/24 1313  Body fluid cell count with differential  RELEASE UPON ORDERING,   TIMED       Comments: Specimen B: Specimen Description Left Upper Lobe BAL Phone [phone]         Previous Biopsy:  no Is  the patient on airborne/droplet precautions? No           Clinical History:  N/A Copy of Report to:  N/A Specimen Disposition: OR Specimen Holding     09/01/24 1313   09/01/24 1313  Fungus Culture With Stain  RELEASE UPON ORDERING,   TIMED       Comments: Specimen B: Specimen Description Left Upper Lobe BAL Phone [phone]         Previous Biopsy:  no Is the patient on airborne/droplet precautions? No           Clinical History:  N/A Copy of Report to:  N/A Specimen Disposition: OR Specimen Holding     09/01/24 1313   09/01/24 1313  Culture, BAL-quantitative w Gram Stain  RELEASE UPON ORDERING,   TIMED       Comments: Specimen B: Specimen Description Left Upper Lobe BAL Phone [phone]         Previous Biopsy:  no Is the patient on airborne/droplet precautions? No           Clinical History:  N/A Copy of Report to:  N/A Specimen Disposition: OR Specimen Holding     09/01/24 1313   09/01/24 1313  Aerobic/Anaerobic Culture w Gram Stain (surgical/deep wound)  RELEASE UPON ORDERING,   TIMED       Comments: Specimen B: Specimen Description Left Upper Lobe BAL Phone [phone]         Previous Biopsy:  no Is the patient on airborne/droplet precautions? No           Clinical History:  N/A Copy of Report to:  N/A Specimen Disposition: OR Specimen Holding     09/01/24 1313   09/01/24 1313  Aerobic Culture w Gram Stain (superficial specimen)   RELEASE UPON ORDERING,   TIMED       Comments: Specimen B: Specimen Description Left Upper Lobe BAL Phone [phone]         Previous Biopsy:  no Is the patient on airborne/droplet precautions? No           Clinical History:  N/A Copy of Report to:  N/A Specimen Disposition: OR Specimen Holding     09/01/24 1313   09/01/24 1313  Acid Fast Culture with reflexed sensitivities  RELEASE UPON ORDERING,   TIMED       Comments: Specimen B: Specimen Description Left Upper Lobe BAL Phone [phone]         Previous Biopsy:  no Is the patient on airborne/droplet precautions? No           Clinical History:  N/A Copy of Report to:  N/A Specimen Disposition: OR Specimen Holding     09/01/24 1313   09/01/24 1313  Acid Fast Smear (AFB)  RELEASE UPON ORDERING,   TIMED       Comments: Specimen B: Specimen Description Left Upper Lobe BAL Phone [phone]         Previous Biopsy:  no Is the patient on airborne/droplet precautions? No           Clinical History:  N/A Copy of Report to:  N/A Specimen Disposition: OR Specimen Holding     09/01/24 1313   09/01/24 1309  Body fluid cell count with differential  RELEASE UPON ORDERING,   TIMED       Comments: Specimen A: Specimen Description Right Upper Lobe BAL Phone [phone]         Previous Biopsy:  no Is the patient on airborne/droplet precautions? No  Clinical History:  N/A Copy of Report to:  N/A Specimen Disposition: OR Specimen Holding     09/01/24 1309   09/01/24 1309  Fungus Culture With Stain  RELEASE UPON ORDERING,   TIMED       Comments: Specimen A: Specimen Description Right Upper Lobe BAL Phone [phone]         Previous Biopsy:  no Is the patient on airborne/droplet precautions? No           Clinical History:  N/A Copy of Report to:  N/A Specimen Disposition: OR Specimen Holding     09/01/24 1309   09/01/24 1309  Culture, BAL-quantitative w Gram Stain  RELEASE UPON ORDERING,   TIMED       Comments: Specimen A:  Specimen Description Right Upper Lobe BAL Phone [phone]         Previous Biopsy:  no Is the patient on airborne/droplet precautions? No           Clinical History:  N/A Copy of Report to:  N/A Specimen Disposition: OR Specimen Holding     09/01/24 1309   09/01/24 1309  Aerobic/Anaerobic Culture w Gram Stain (surgical/deep wound)  RELEASE UPON ORDERING,   TIMED       Comments: Specimen A: Specimen Description Right Upper Lobe BAL Phone [phone]         Previous Biopsy:  no Is the patient on airborne/droplet precautions? No           Clinical History:  N/A Copy of Report to:  N/A Specimen Disposition: OR Specimen Holding     09/01/24 1309   09/01/24 1309  Aerobic Culture w Gram Stain (superficial specimen)  RELEASE UPON ORDERING,   TIMED       Comments: Specimen A: Specimen Description Right Upper Lobe BAL Phone [phone]         Previous Biopsy:  no Is the patient on airborne/droplet precautions? No           Clinical History:  N/A Copy of Report to:  N/A Specimen Disposition: OR Specimen Holding     09/01/24 1309   09/01/24 1309  Fungus Stain  RELEASE UPON ORDERING,   TIMED       Comments: Specimen A: Specimen Description Right Upper Lobe BAL Phone [phone]         Previous Biopsy:  no Is the patient on airborne/droplet precautions? No           Clinical History:  N/A Copy of Report to:  N/A Specimen Disposition: OR Specimen Holding     09/01/24 1309   09/01/24 1309  Acid Fast Culture with reflexed sensitivities  RELEASE UPON ORDERING,   TIMED       Comments: Specimen A: Specimen Description Right Upper Lobe BAL Phone [phone]         Previous Biopsy:  no Is the patient on airborne/droplet precautions? No           Clinical History:  N/A Copy of Report to:  N/A Specimen Disposition: OR Specimen Holding     09/01/24 1309   09/01/24 1309  Acid Fast Smear (AFB)  RELEASE UPON ORDERING,   TIMED       Comments: Specimen A: Specimen Description Right Upper Lobe  BAL Phone [phone]         Previous Biopsy:  no Is the patient on airborne/droplet precautions? No           Clinical History:  N/A Copy of Report to:  N/A  Specimen Disposition: OR Specimen Holding     09/01/24 1309   09/01/24 0500  QuantiFERON-TB Gold Plus  Tomorrow morning,   R        08/31/24 0948   08/31/24 1129  Miscellaneous [facility] test (send-out)  Once,   R       Question:  Test name / description:  Answer:  [facility] TEST 139172 Coccidioides CF   08/31/24 1129   08/31/24 0950  Histoplasma antigen, urine  Once,   R        08/31/24 0949   08/31/24 0950  Blastomyces Antigen  Once,   R        08/31/24 0949   08/31/24 0114  Fungitell Beta-D-Glucan  Once,   R        08/31/24 0113            Signed, Chapman Rota, MD [facility] 09/01/2024

## 2024-09-01 NOTE — Transfer of Care (Signed)
 Immediate Anesthesia Transfer of Care Note  Patient: Michael [name]  Procedure(s) Performed: BRONCHOSCOPY, WITH FLUOROSCOPY (Bilateral)  Patient Location: PACU  Anesthesia Type:General  Level of Consciousness: awake, alert , and oriented  Airway & Oxygen  Therapy: Patient Spontanous Breathing and Patient connected to face mask oxygen   Post-op Assessment: Report given to RN and Post -op Vital signs reviewed and stable  Post vital signs: Reviewed and stable  Last Vitals:  Vitals Value Taken Time  BP 99/56 09/01/24 14:45  Temp    Pulse 79 09/01/24 14:48  Resp 19 09/01/24 14:48  SpO2 92 % 09/01/24 14:48  Vitals shown include unfiled device data.  Last Pain:  Vitals:   09/01/24 1210  TempSrc: Oral  PainSc: 0-No pain         Complications: No notable events documented.

## 2024-09-01 NOTE — Op Note (Signed)
 [facility] Cardiopulmonary Patient Name: Michael [name] Date: 09/01/2024 MRN: [id number] Attending MD: Lonna Coder , MD, [id number] Date of Birth: 09/23/1966 CSN: Finalized Age: 58 Admit Type: Inpatient Gender: Male Procedure:             Bronchoscopy Indications:           Bilateral infiltrate Providers:             Lonna Coder, MD, Hoy Penner, RN, Fairy Marina, Technician Referring MD:           Medicines:             General Anesthesia Complications:         No immediate complications Estimated Blood Loss:  Estimated blood loss: none. Procedure:             Pre-Anesthesia Assessment:                        - A History and Physical has been performed. Patient                         meds and allergies have been reviewed. The risks and                         benefits of the procedure and the sedation options and                         risks were discussed with the patient. All questions                         were answered and informed consent was obtained.                         Patient identification and proposed procedure were                         verified prior to the procedure by the physician in                         the pre-procedure area. Mental Status Examination:                         alert and oriented. Airway Examination: normal                         oropharyngeal airway. Respiratory Examination: clear                         to auscultation. CV Examination: RRR, no murmurs, no                         S3 or S4. ASA Grade Assessment: II - A patient with                         mild systemic disease. After reviewing the risks and  benefits, the patient was deemed in satisfactory                         condition to undergo the procedure. The anesthesia                         plan was to use general anesthesia. Immediately prior                         to administration  of medications, the patient was                         re-assessed for adequacy to receive sedatives. The                         heart rate, respiratory rate, oxygen  saturations,                         blood pressure, adequacy of pulmonary ventilation, and                         response to care were monitored throughout the                         procedure. The physical status of the patient was                         re-assessed after the procedure.                        After obtaining informed consent, the bronchoscope was                         passed under direct vision. Throughout the procedure,                         the patient's blood pressure, pulse, and oxygen                          saturations were monitored continuously. the BF-H190                         ([id number]) OIympus bronchoscope was introduced through                         the mouth, via the endotracheal tube (the patient was                         intubated for the procedure) and advanced to the                         tracheobronchial tree of both lungs. The procedure was                         accomplished without difficulty. Scope In: Scope Out: Findings:      The endotracheal tube is in good position. The visualized portion of the       trachea is of normal caliber. The carina is sharp. The tracheobronchial  tree was examined to at least the first subsegmental level. Bronchial       mucosa and anatomy are normal; there are no endobronchial lesions, and       no secretions.      The bronchoscope was advanced until wedged at the desired location for       bronchoalveolar lavage. BAL was performed in the RUL anterior segment       (B3) and in the LUL anterior segment (B3) of the lung and sent for cell       count, bacterial culture, viral smears & culture, and fungal & AFB       analysis and cytology. 180 mL of fluid were instilled. 100 mL were       returned. The return was cloudy. There were  no mucoid plugs in the       return fluid. Multiple specimens were obtained and pooled into one       specimen, which was sent for analysis.      Fluoroscopy guided transbronchial brushings of an area of infiltration       were obtained in the apical-posterior segment of the left upper lobe and       in the anterior segment of the left upper lobe with a cytology brush and       sent for routine cytology. Three samples were obtained. Transbronchial       brushing technique was selected because the sampling site was not       visible endoscopically.      Transbronchial biopsies of an area of infiltration were performed in the       apical-posterior segment of the left upper lobe and in the anterior       segment of the left upper lobe using alligator forceps and sent for       histopathology examination. The procedure was guided by fluoroscopy.       Transbronchial biopsy technique was selected because the sampling site       was not visible endoscopically. Three biopsy passes were performed.       Three biopsy samples were obtained.      A peripheral radial ultrasound probe without a sheath was utilized in       order to better characterize the area of infiltration in the       apical-posterior segment of the left upper lobe and in the anterior       segment of the left upper lobe. Impression:            - Bilateral infiltrate                        - The airway examination was normal.                        - Bronchoalveolar lavage was performed.                        - Transbronchial brushings were obtained.                        - Transbronchial lung biopsies were performed.                        - Endobronchial ultrasound was performed. Moderate Sedation:      GA Recommendation:        -  Await BAL, biopsy, brushing, culture and cytology                         results. Procedure Code(s):     --- Professional ---                        [phone], Bronchoscopy, rigid or flexible,  including                         fluoroscopic guidance, when performed; with                         transbronchial lung biopsy(s), single lobe                        31624, Bronchoscopy, rigid or flexible, including                         fluoroscopic guidance, when performed; with bronchial                         alveolar lavage                        [phone], Bronchoscopy, rigid or flexible, including                         fluoroscopic guidance, when performed; with brushing                         or protected brushings                        31654, Bronchoscopy, rigid or flexible, including                         fluoroscopic guidance, when performed; with                         transendoscopic endobronchial ultrasound (EBUS) during                         bronchoscopic diagnostic or therapeutic                         intervention(s) for peripheral lesion(s) (List                         separately in addition to code for primary                         procedure[s]) Diagnosis Code(s):     --- Professional ---                        R91.8, Other nonspecific abnormal finding of lung field CPT copyright 2022 American Medical Association. All rights reserved. The codes documented in this report are preliminary and upon coder review may  be revised to meet current compliance requirements. Yan Pankratz, MD Abigaile Rossie, MD 09/01/2024 2:01:56 PM Number of Addenda: 0

## 2024-09-02 ENCOUNTER — Telehealth: Payer: Self-pay | Admitting: Pulmonary Disease

## 2024-09-02 ENCOUNTER — Inpatient Hospital Stay: Admitting: Internal Medicine

## 2024-09-02 ENCOUNTER — Encounter (HOSPITAL_COMMUNITY): Payer: Self-pay | Admitting: Pulmonary Disease

## 2024-09-02 DIAGNOSIS — J984 Other disorders of lung: Secondary | ICD-10-CM | POA: Diagnosis not present

## 2024-09-02 DIAGNOSIS — D649 Anemia, unspecified: Secondary | ICD-10-CM | POA: Diagnosis not present

## 2024-09-02 DIAGNOSIS — R0602 Shortness of breath: Secondary | ICD-10-CM | POA: Diagnosis not present

## 2024-09-02 DIAGNOSIS — J188 Other pneumonia, unspecified organism: Secondary | ICD-10-CM | POA: Diagnosis not present

## 2024-09-02 DIAGNOSIS — N182 Chronic kidney disease, stage 2 (mild): Secondary | ICD-10-CM | POA: Diagnosis not present

## 2024-09-02 DIAGNOSIS — C649 Malignant neoplasm of unspecified kidney, except renal pelvis: Secondary | ICD-10-CM | POA: Diagnosis not present

## 2024-09-02 LAB — FUNGITELL BETA-D-GLUCAN: Fungitell Value:: 57.378 pg/mL

## 2024-09-02 LAB — MISC LABCORP TEST (SEND OUT): Labcorp test code: 139172

## 2024-09-02 MED ORDER — PREDNISONE 50 MG PO TABS: 50.0000 mg | ORAL_TABLET | Freq: Every day | ORAL | 0 refills | Status: DC

## 2024-09-02 MED ORDER — SULFAMETHOXAZOLE-TRIMETHOPRIM 800-160 MG PO TABS: 1.0000 | ORAL_TABLET | ORAL | Status: DC

## 2024-09-02 MED ORDER — PREDNISONE 20 MG PO TABS: 50.0000 mg | ORAL_TABLET | Freq: Every day | ORAL | Status: DC

## 2024-09-02 MED ORDER — SULFAMETHOXAZOLE-TRIMETHOPRIM 800-160 MG PO TABS: 1.0000 | ORAL_TABLET | ORAL | 0 refills | Status: AC

## 2024-09-02 MED ORDER — PREDNISONE 20 MG PO TABS
50.0000 mg | ORAL_TABLET | Freq: Every day | ORAL | Status: DC
  Administered 2024-09-02: 50 mg via ORAL
  Filled 2024-09-02: qty 1

## 2024-09-02 NOTE — Progress Notes (Signed)
    Durable Medical Equipment  (From admission, onward)           Start     Ordered   09/02/24 1221  For home use only DME oxygen   Once       Question Answer Comment  Length of Need 6 Months   Mode or (Route) Nasal cannula   Liters per Minute 2   Frequency Continuous (stationary and portable oxygen  unit needed)   Oxygen  conserving device Yes   Oxygen  delivery system: Gas      09/02/24 1220

## 2024-09-02 NOTE — Progress Notes (Signed)
 Patient ambulated independently on the unit, completing 3 laps around the unit. Patient ambulated on 2 Liters oxygen . Patient's oxygen  saturation during ambulation ranged from 83-87%, most consistently maintaining at 85%.  Patient denied feeling short of breath with ambulation. Patient returned to recliner and oxygen  saturations improved to 92% on 2 Liters of oxygen .

## 2024-09-02 NOTE — Telephone Encounter (Signed)
 ATC X1. LMTCB

## 2024-09-02 NOTE — TOC Transition Note (Signed)
 Transition of Care [facility]) - Discharge Note   Patient Details  Name: Michael [name] MRN: [id number] Date of Birth: [birth year]  [facility] CM/SW Contact:  Tom-Johnson, Lilyian Quayle Daphne, RN Phone Number: 09/02/2024, 3:00 PM   Clinical Narrative:     Patient is scheduled for discharge today.  Readmission Risk Assessment done. Outpatient f/u, hospital f/u and discharge instructions on AVS. Home O2 ordered from [employer] and [employer] to deliver to patient's home. Father, Crew Goren. at bedside and will transport at discharge.  No further ICM needs noted.        Final next level of care: Home/Self Care Barriers to Discharge: Barriers Resolved   Patient Goals and CMS Choice Patient states their goals for this hospitalization and ongoing recovery are:: To return home CMS Medicare.gov Compare Post Acute Care list provided to:: Patient Choice offered to / list presented to : Patient      Discharge Placement                Patient to be transferred to facility by: Father Name of family member notified: [name]    Discharge Plan and Services Additional resources added to the After Visit Summary for                  DME Arranged: Oxygen  DME Agency: [employer] Date DME Agency Contacted: 09/02/24 Time DME Agency Contacted: 1350 Representative spoke with at DME Agency: [facility] HH Arranged: NA HH Agency: NA        Social Drivers of Health (SDOH) Interventions SDOH Screenings   Food Insecurity: No Food Insecurity (08/31/2024)  Housing: Low Risk  (08/31/2024)  Transportation Needs: No Transportation Needs (08/31/2024)  Utilities: Not At Risk (08/31/2024)  Alcohol  Screen: Low Risk  (10/07/2023)  Depression (PHQ2-9): Low Risk  (06/11/2024)  Financial Resource Strain: Low Risk  (08/29/2019)   Received from [facility]  Physical Activity: Insufficiently Active (08/29/2019)   Received from [facility]  Social Connections: Unknown  (05/11/2022)   Received from [facility]  Stress: No Stress Concern Present (08/29/2019)   Received from [facility]  Tobacco Use: Low Risk  (09/01/2024)     Readmission Risk Interventions    09/02/2024    2:51 PM  Readmission Risk Prevention Plan  Transportation Screening Complete  PCP or Specialist Appt within 3-5 Days Complete  HRI or Home Care Consult Complete  Social Work Consult for Recovery Care Planning/Counseling Complete  Palliative Care Screening Not Applicable  Medication Review Oceanographer) Referral to Pharmacy

## 2024-09-02 NOTE — Progress Notes (Signed)
 NAME:  Michael [name], MRN:  [id number], DOB:  03/22/1966, LOS: 2 ADMISSION DATE:  08/30/2024, CONSULTATION DATE:  08/31/2024 REFERRING MD:  JONELLE Copa MD, CHIEF COMPLAINT:  Acute resp failure, dyspnea   History of Present Illness:   The patient is a 58 year old with stage 4 renal cell cancer who presents with persistent shortness of breath.   Dyspnea and hypoxemia - Persistent shortness of breath for approximately one month, unresponsive to antibiotics and steroids - Hospitalized in early October for hypoxemia with oxygen  saturation in the 80s - Recent oxygen  saturation fluctuating, dropping to the low 90s - Fever associated with episodes of hypoxemia - Shortness of breath improved temporarily with steroids but never fully resolved - Increased difficulty breathing during cold weather - No cough or phlegm production  Oncologic history and treatment - Stage IV renal cell carcinoma - Status post right nephrectomy - History of brain tumor resected in December - Chest and spine tumors treated with stereotactic radiation - Currently receiving nivolumab  - Cabometyx  discontinued due to liver toxicity  Current medications and interventions - Tapering course of steroids - Recent courses of antibiotics including azithromycin (Z-Pak) and possibly doxycycline  - Oxygen  therapy administered during hospitalization  Functional status - Engages in activities such as golf and tennis - Experiences increased dyspnea with exertion, particularly in cold weather    Pertinent  Medical History    has a past medical history of Cancer (HCC), Clear cell renal cell carcinoma (HCC), Hypertension, Hypothyroidism, and Sleep apnea.   Significant Hospital Events: Including procedures, antibiotic start and stop dates in addition to other pertinent events   09/02/2024. Status post bronchoscopy.  Had desats postprocedure and observed in ICU.  No pneumothorax on chest x-ray  Interim History / Subjective:    Feels well. No issues Walking around the unit.  O2 weaned down to 1 L  Objective    Blood pressure 138/87, pulse 78, temperature 97.9 F (36.6 C), temperature source Oral, resp. rate (!) 21, height 5' 6 (1.676 m), weight 111.1 kg, SpO2 92%.        Intake/Output Summary (Last 24 hours) at 09/02/2024 1206 Last data filed at 09/02/2024 1100 Gross per 24 hour  Intake 1500 ml  Output --  Net 1500 ml   Filed Weights   09/01/24 1210  Weight: 111.1 kg    Examination: Blood pressure 138/87, pulse 78, temperature 97.9 F (36.6 C), temperature source Oral, resp. rate (!) 21, height 5' 6 (1.676 m), weight 111.1 kg, SpO2 92%. Gen:      No acute distress HEENT:  EOMI, sclera anicteric Neck:     No masses; no thyromegaly Lungs:    Clear to auscultation bilaterally; normal respiratory effort CV:         Regular rate and rhythm; no murmurs Abd:      + bowel sounds; soft, non-tender; no palpable masses, no distension Ext:    No edema; adequate peripheral perfusion Neuro: alert and oriented x 3 Psych: normal mood and affect   Resolved problem list   Assessment and Plan  Shortness of breath with pulmonary infiltrates and nodular opacities Persistent shortness of breath for about a month with pulmonary infiltrates and nodular opacities in the upper lobes. Differential diagnosis includes pneumonia, pneumonitis from immunotherapy, or cancer progression. Recent CT showed infiltrates and nodular opacities, with some improvement but new involvement in the left upper lobe. Oxygen  levels have fluctuated, with recent drops to low 90s and fever. Previous treatments with antibiotics and steroids provided  temporary relief but did not resolve symptoms. - Underwent bronchoscopy with lavage and brushing, biopsy on 10/30 - Cell count reviewed with mild neutrophilia.  Most of the other studies are pending - Can change steroids to prednisone  at 50 mg/day and keep at that dose until he can be reassessed in  office. Start bactrim for Pneumocystis prophylaxis - Prescribe oxygen  2 L to be used as needed at home - Will also send labs before discharge to look for any other autoimmune etiology - Ok to discharge from our perspective we will make follow-up in clinic.  Stage IV renal cell carcinoma with metastases to lung, brain, bone, and chest wall Stage IV renal cell carcinoma with metastases to lung, brain, bone, and chest wall. Currently on nivolumab  immunotherapy since February 2025. Previous treatments included Keytruda  and Cabometyx , with Cabometyx  held due to liver reaction. Recent stereotactic radiation to chest wall in February/March 2025.  Signature:   Mohammed Mcandrew MD [facility] See Amion for pager  If no response to pager , please call [phone] until 7pm After 7:00 pm call Elink  [phone] 09/02/2024, 12:06 PM

## 2024-09-02 NOTE — Progress Notes (Signed)
 Michael [name]   DOB:[birth year]   FM#:[id number]      ASSESSMENT & PLAN:  Michael [name] is a 58 year old male patient with oncologic history significant for clear cell renal cell carcinoma with pulmonary mets.  Admitted on 10/28 due to complaints of shortness of breath.    Clear cell RCC (Renal cell carcinoma) with pulmonary mets and solitary brain mets  -- Initially diagnosed 2020  -- Status post right nephrectomy 08/27/2019. Status post cryoablation of left renal mass 05/30/2021.  -- Status post treatment with Keytruda  and axitinib  x2 cycles, discontinued due to autoimmune hepatitis.  Continued on Keytruda  every 3 weeks. Last dose July 2024 -- Status post SRS, craniotomy and surgical resection 10/13/2023. -- Started on Nivolumab /Cabometyx  every 28 days. Cabo discontinued due to intolerance.  Continued with Nivolumab  (Opdivo ), last dose given 08/23/24, cycle 7.   -- Medical Oncology/Dr. Sherrod following closely.  Shortness of breath Pneumonitis -- Admitted with complaints of SOB after receiving immunotherapy. Patient reports that this is the second time this has happened to him after receiving immunotherapy.  -- Status post bronchoscopy 10/29. Follow results.  -- Continue antibiotics as ordered -- Follow up with Pulm as outpatient.  Anemia -- Hemoglobin 10.7 on 10/28 -- Likely due to malignancy, oncologic therapy, and renal dysfunction -- No transfusional intervention required at this time -- continue to monitor CBC with differential     Code Status Full   Subjective:  Patient seen awake and alert sitting up in chair.  Family member at bedside.  Reports that he is feeling much better and is being discharged today.  States he came in with shortness of breath but that he is breathing better and feels ready to go home.  Agreeable to outpatient oncology followup.  No other complaints offered.   Objective:   Intake/Output Summary (Last 24 hours) at 09/02/2024 1319 Last data filed  at 09/02/2024 1100 Gross per 24 hour  Intake 1500 ml  Output --  Net 1500 ml     PHYSICAL EXAMINATION: ECOG PERFORMANCE STATUS: 1 - Symptomatic but completely ambulatory  Vitals:   09/02/24 1229 09/02/24 1245  BP:    Pulse: 80 75  Resp: (!) 23 14  Temp:    SpO2: 91% 94%   Filed Weights   09/01/24 1210  Weight: 245 lb (111.1 kg)    GENERAL: alert, no distress and comfortable SKIN: skin color, texture, turgor are normal, no rashes or significant lesions EYES: normal, conjunctiva are pink and non-injected, sclera clear OROPHARYNX: no exudate, no erythema and lips, buccal mucosa, and tongue normal  NECK: supple, thyroid  normal size, non-tender, without nodularity LYMPH: no palpable lymphadenopathy in the cervical, axillary or inguinal LUNGS: +diminished to auscultation and percussion with normal breathing effort HEART: regular rate & rhythm and no murmurs and no lower extremity edema ABDOMEN: abdomen soft, non-tender and normal bowel sounds MUSCULOSKELETAL: no cyanosis of digits and no clubbing  PSYCH: alert & oriented x 3 with fluent speech NEURO: no focal motor/sensory deficits   All questions were answered. The patient knows to call the clinic with any problems, questions or concerns.   The total time spent in the appointment was 40 minutes encounter with patient including review of chart and various tests results, discussions about plan of care and coordination of care plan  Olam Michael Brunner, NP 09/02/2024 1:19 PM    Labs Reviewed:  Lab Results  Component Value Date   WBC 7.2 08/31/2024   HGB 10.7 (L) 08/31/2024   HCT  34.5 (L) 08/31/2024   MCV 82.9 08/31/2024   PLT 358 08/31/2024   Recent Labs    08/23/24 1410 08/26/24 2301 08/30/24 1717 08/31/24 0257  NA 137 136 137 138  K 3.7 4.4 4.0 3.9  CL 103 100 105 105  CO2 26 24 18* 22  GLUCOSE 119* 107* 94 120*  BUN 18 14 10 12   CREATININE 1.23 1.37* 1.25* 1.31*  CALCIUM 9.3 9.1 8.5* 8.3*  GFRNONAA >60 >60  >60 >60  PROT 7.0  --  6.6 5.8*  ALBUMIN 3.9  --  2.8* 2.5*  AST 18  --  23 37  ALT 27  --  31 36  ALKPHOS 78  --  68 62  BILITOT 0.8  --  0.7 0.8    Studies Reviewed:  DG CHEST PORT 1 VIEW Result Date: 09/01/2024 EXAM: 1 VIEW XRAY OF THE CHEST 09/01/2024 02:07:00 PM COMPARISON: 08/30/2024 CLINICAL HISTORY: Status post bronchoscopy with biopsy FINDINGS: LUNGS AND PLEURA: Low lung volumes. Worsening bilateral airspace opacities. Small pleural effusions. Pulmonary nodule in left upper lung not well delineated. No pneumothorax. HEART AND MEDIASTINUM: No acute abnormality of the cardiac and mediastinal silhouettes. BONES AND SOFT TISSUES: No acute osseous abnormality. IMPRESSION: 1. Worsening bilateral airspace opacities with small pleural effusions. 2. Left upper lung pulmonary nodule not well delineated. 3. No pneumothorax identified after bronchoscopy. Electronically signed by: Waddell Calk MD 09/01/2024 03:22 PM EDT RP Workstation: [id number]   DG C-ARM BRONCHOSCOPY Result Date: 09/01/2024 C-ARM BRONCHOSCOPY: Fluoroscopy was utilized by the requesting physician.  No radiographic interpretation.   DG C-Arm 1-60 Min-No Report Result Date: 09/01/2024 Fluoroscopy was utilized by the requesting physician.  No radiographic interpretation.   DG Chest 2 View Result Date: 08/30/2024 EXAM: 2 VIEW(S) XRAY OF THE CHEST 08/30/2024 05:36:00 PM COMPARISON: 08/26/2024 CLINICAL HISTORY: [id number] SOB (shortness of breath) [id number]. Michael [name] , a 58 y.o. male was evaluated in triage. Pt complains of sob. Hx of RCC undergoing chemo therapy. Pt complains of sob. FINDINGS: LUNGS AND PLEURA: Multifocal airspace disease in the bilateral upper lobes and bilateral lower lobes, not significantly changed. No pulmonary edema. No pleural effusion. No pneumothorax. HEART AND MEDIASTINUM: No acute abnormality of the cardiac and mediastinal silhouettes. BONES AND SOFT TISSUES: No acute osseous abnormality. IMPRESSION: 1.  Stable Multifocal airspace disease in the bilateral upper lobes and bilateral lower lobes. 2. No pleural effusion or pneumothorax. Electronically signed by: Greig Pique MD 08/30/2024 06:07 PM EDT RP Workstation: [id number]   CT Angio Chest PE W and/or Wo Contrast Result Date: 08/27/2024 CLINICAL DATA:  Fevers and cough, initial encounter EXAM: CT ANGIOGRAPHY CHEST WITH CONTRAST TECHNIQUE: Multidetector CT imaging of the chest was performed using the standard protocol during bolus administration of intravenous contrast. Multiplanar CT image reconstructions and MIPs were obtained to evaluate the vascular anatomy. RADIATION DOSE REDUCTION: This exam was performed according to the departmental dose-optimization program which includes automated exposure control, adjustment of the mA and/or kV according to patient size and/or use of iterative reconstruction technique. CONTRAST:  75mL OMNIPAQUE  IOHEXOL  350 MG/ML SOLN COMPARISON:  Chest x-ray from the previous day.  CT from 07/30/2024 FINDINGS: Cardiovascular: Thoracic aorta and its branches are within normal limits. Pulmonary artery shows no pulmonary emboli. No coronary calcifications are seen. No cardiac enlargement is noted. Mediastinum/Nodes: Thoracic inlet is within normal limits. No hilar or mediastinal adenopathy is noted. The esophagus as visualized is within normal limits. Lungs/Pleura: Right-sided airspace infiltrates are identified which has an of improved somewhat in  the interval from the prior CT examination. Some improved aeration in the left lower lobe is noted when compare with the prior exam although increase in left upper lobe infiltrate is seen. There are some rounded densities identified suspicious for metastatic disease. The most prominent of these measures 7 mm in the right upper lobe best seen on image number 50 of series 12. This is slightly increased from 5.5 mm on the prior exam. Upper Abdomen: Visualized upper abdomen demonstrates cysts in  the liver stable in appearance from the prior exam. Musculoskeletal: The previously seen right manubrial lytic lesion is again identified relatively stable from the prior exam. No other rib lesions are seen. Lytic lesion in the anterior aspect of the T9 vertebral body is again noted and stable. No new vertebral lytic lesions are seen. Review of the MIP images confirms the above findings. IMPRESSION: Changes consistent with bony metastatic disease stable from the prior exam. Waxing and waning infiltrates in both lungs as described above. The left upper lobe shows the most amount of worsening infiltrate. Rounded nodular densities the largest of which lies in the right upper lobe and suspicious for metastatic disease given the clinical history. No evidence of pulmonary emboli. Electronically Signed   By: Oneil Devonshire M.D.   On: 08/27/2024 03:31   DG Chest 2 View Result Date: 08/26/2024 EXAM: 2 VIEW(S) XRAY OF THE CHEST 08/26/2024 11:44:00 PM COMPARISON: 08/03/2024 CLINICAL HISTORY: shortness of breath. Per chart - Pt complains of sob. Report having fever, chills, sob, cough today. Was seen a few weeks ago for similar and hospitalized for pna vs pneumonitis. Report feeling better until today when he was outside playing tennis in the cold. Remote hx of kidney cancer currently on immunotherapy. No hx of PE/DVT. FINDINGS: LUNGS AND PLEURA: Persistent bilateral airspace opacities. Improved aeration of right upper lobe with worsening aeration of left upper lobe. Similar airspace opacities in the bilateral lower lungs. No pulmonary edema. No pleural effusion. No pneumothorax. HEART AND MEDIASTINUM: No acute abnormality of the cardiac and mediastinal silhouettes. BONES AND SOFT TISSUES: No acute osseous abnormality. IMPRESSION: 1. Persistent bilateral airspace opacities- improved aeration of the right upper lobe and worsening aeration of the left upper lobe. Similar opacities in the bilateral lower lungs compared to  08/03/2024. Electronically signed by: Norman Gatlin MD 08/26/2024 11:57 PM EDT RP Workstation: [id number]   CT HEMATURIA WORKUP Result Date: 08/05/2024 CLINICAL DATA:  [id number] Metastasis from malignant neoplasm of right kidney (HCC) [id number]. * Tracking Code: BO * EXAM: CT ABDOMEN AND PELVIS WITHOUT AND WITH CONTRAST TECHNIQUE: Multidetector CT imaging of the abdomen and pelvis was performed following the standard protocol before and following the bolus administration of intravenous contrast. RADIATION DOSE REDUCTION: This exam was performed according to the departmental dose-optimization program which includes automated exposure control, adjustment of the mA and/or kV according to patient size and/or use of iterative reconstruction technique. CONTRAST:  OMNIPAQUE  IOHEXOL  300 MG/ML  SOLN COMPARISON:  CT scan chest, abdomen and pelvis from 07/30/2024. FINDINGS: Lower chest: Redemonstration of heterogeneous opacities with air bronchogram in the bilateral lower lobes, essentially similar to the prior study. No pleural effusion. Normal heart size. No pericardial effusion. Hepatobiliary: The liver is normal in size. Non-cirrhotic configuration. No suspicious mass. Redemonstration of multiple simple cysts throughout the liver with largest measuring up to 2.8 x 3.5 cm. No intrahepatic or extrahepatic bile duct dilation. No calcified gallstones. Normal gallbladder wall thickness. No pericholecystic inflammatory changes. Pancreas: Unremarkable. No pancreatic ductal dilatation or  surrounding inflammatory changes. Spleen: Within normal limits. No focal lesion. Adrenals/Urinary Tract: Adrenal glands are unremarkable. Non-contrast images: No radiopaque urinary tract calculi. Kidneys: Symmetric enhancement. Surgically absent right kidney. No local recurrent tumor noted in the right nephrectomy bed. Redemonstration of partially exophytic approximately 1.5 x 1.6 cm lesion arising from the left kidney interpolar region,  laterally. The lesion exhibit thin slightly hyperattenuating walls and central macroscopic fat attenuation. This is incompletely characterized but appears unchanged since the prior PET-CT scan and was not FDG avid, favoring benign etiology. Urinary Tract Opacification: Adequate. Collecting Systems and Ureters: No filling defects, masses, strictures, or areas of abnormal dilatation. The apparent Focus of soft tissue dilation of the left ureter described on the prior CT scan corresponds to focally dilated/peristaltic left ureter. No focal mass seen. Urinary Bladder: No filling defect, wall thickening, or mass. Stomach/Bowel: No disproportionate dilation of the small or large bowel loops. No evidence of abnormal bowel wall thickening or inflammatory changes. The appendix is unremarkable. Vascular/Lymphatic: No ascites or pneumoperitoneum. No abdominal or pelvic lymphadenopathy, by size criteria. No aneurysmal dilation of the major abdominal arteries. Reproductive: Normal size prostate. Symmetric seminal vesicles. Other: There is a tiny fat containing umbilical hernia. The soft tissues and abdominal wall are otherwise unremarkable. Musculoskeletal: Redemonstration of lytic lesion in the anterior aspect of T8 vertebral body without interval change. There are mild - moderate multilevel degenerative changes in the visualized spine. IMPRESSION: 1. No metastatic disease identified within the abdomen or pelvis. 2. Multiple other nonacute observations, as described above. Electronically Signed   By: Ree Molt M.D.   On: 08/05/2024 15:24

## 2024-09-02 NOTE — Plan of Care (Signed)
  Problem: Education: Goal: Knowledge of General Education information will improve Description: Including pain rating scale, medication(s)/side effects and non-pharmacologic comfort measures Outcome: Progressing   Problem: Clinical Measurements: Goal: Ability to maintain clinical measurements within normal limits will improve Outcome: Progressing Goal: Respiratory complications will improve Outcome: Progressing   Problem: Clinical Measurements: Goal: Respiratory complications will improve Outcome: Progressing   Problem: Activity: Goal: Risk for activity intolerance will decrease Outcome: Progressing

## 2024-09-02 NOTE — Progress Notes (Signed)
 Subjective:  Required BiPAP and has been started on steroids with O2 requirements having gone down overnight in the ICU  Antibiotics:  Anti-infectives (From admission, onward)    Start     Dose/Rate Route Frequency Ordered Stop   08/31/24 1600  vancomycin  (VANCOREADY) IVPB 750 mg/150 mL  Status:  Discontinued        750 mg 150 mL/hr over 60 Minutes Intravenous Every 12 hours 08/31/24 0141 08/31/24 0931   08/31/24 0800  ceFEPIme (MAXIPIME) 2 g in sodium chloride  0.9 % 100 mL IVPB  Status:  Discontinued        2 g 200 mL/hr over 30 Minutes Intravenous Every 8 hours 08/31/24 0141 08/31/24 0931   08/30/24 2330  vancomycin  (VANCOREADY) IVPB 2000 mg/400 mL        2,000 mg 200 mL/hr over 120 Minutes Intravenous  Once 08/30/24 2322 08/31/24 0442   08/30/24 2330  ceFEPIme (MAXIPIME) 2 g in sodium chloride  0.9 % 100 mL IVPB        2 g 200 mL/hr over 30 Minutes Intravenous  Once 08/30/24 2322 08/31/24 0147       Medications: Scheduled Meds:  albuterol  2.5 mg Nebulization Once   amLODipine   7.5 mg Oral Daily   Chlorhexidine  Gluconate Cloth  6 each Topical Daily   enoxaparin (LOVENOX) injection  55 mg Subcutaneous Q24H   ipratropium-albuterol  3 mL Nebulization Q4H   levothyroxine   150 mcg Oral QAC breakfast   methylPREDNISolone  (SOLU-MEDROL ) injection  80 mg Intravenous Q24H   montelukast   10 mg Oral Daily   Continuous Infusions: PRN Meds:.acetaminophen  **OR** acetaminophen     Objective: Weight change:   Intake/Output Summary (Last 24 hours) at 09/02/2024 1153 Last data filed at 09/02/2024 1100 Gross per 24 hour  Intake 1500 ml  Output --  Net 1500 ml   Blood pressure 138/87, pulse 86, temperature 97.9 F (36.6 C), temperature source Oral, resp. rate (!) 22, height 5' 6 (1.676 m), weight 111.1 kg, SpO2 93%. Temp:  [96.5 F (35.8 C)-97.9 F (36.6 C)] 97.9 F (36.6 C) (10/30 1100) Pulse Rate:  [52-89] 86 (10/30 1103) Resp:  [11-33] 22 (10/30 1103) BP:  (93-138)/(58-87) 138/87 (10/30 1000) SpO2:  [88 %-95 %] 93 % (10/30 1130) Weight:  [111.1 kg] 111.1 kg (10/29 1210)  Physical Exam: Physical Exam Constitutional:      Appearance: Normal appearance.  HENT:     Head: Normocephalic and atraumatic.  Eyes:     General:        Right eye: No discharge.        Left eye: No discharge.     Extraocular Movements: Extraocular movements intact.     Pupils: Pupils are equal, round, and reactive to light.  Cardiovascular:     Rate and Rhythm: Normal rate and regular rhythm.  Pulmonary:     Effort: No respiratory distress.     Breath sounds: No wheezing.  Abdominal:     General: There is no distension.  Musculoskeletal:        General: Normal range of motion.     Cervical back: Normal range of motion and neck supple.  Skin:    General: Skin is warm and dry.  Neurological:     General: No focal deficit present.     Mental Status: He is alert and oriented to person, place, and time.  Psychiatric:        Mood and Affect: Mood normal.  Behavior: Behavior normal.        Thought Content: Thought content normal.        Judgment: Judgment normal.      CBC:    BMET Recent Labs    08/30/24 1717 08/31/24 0257  NA 137 138  K 4.0 3.9  CL 105 105  CO2 18* 22  GLUCOSE 94 120*  BUN 10 12  CREATININE 1.25* 1.31*  CALCIUM 8.5* 8.3*     Liver Panel  Recent Labs    08/30/24 1717 08/31/24 0257  PROT 6.6 5.8*  ALBUMIN 2.8* 2.5*  AST 23 37  ALT 31 36  ALKPHOS 68 62  BILITOT 0.7 0.8       Sedimentation Rate No results for input(s): ESRSEDRATE in the last 72 hours. C-Reactive Protein No results for input(s): CRP in the last 72 hours.  Micro Results: Recent Results (from the past 720 hours)  Respiratory (~20 pathogens) panel by PCR     Status: None   Collection Time: 08/03/24 11:37 PM   Specimen: Nasopharyngeal Swab; Respiratory  Result Value Ref Range Status   Adenovirus NOT DETECTED NOT DETECTED Final    Coronavirus 229E NOT DETECTED NOT DETECTED Final    Comment: (NOTE) The Coronavirus on the Respiratory Panel, DOES NOT test for the novel  Coronavirus (2019 nCoV)    Coronavirus HKU1 NOT DETECTED NOT DETECTED Final   Coronavirus NL63 NOT DETECTED NOT DETECTED Final   Coronavirus OC43 NOT DETECTED NOT DETECTED Final   Metapneumovirus NOT DETECTED NOT DETECTED Final   Rhinovirus / Enterovirus NOT DETECTED NOT DETECTED Final   Influenza A NOT DETECTED NOT DETECTED Final   Influenza B NOT DETECTED NOT DETECTED Final   Parainfluenza Virus 1 NOT DETECTED NOT DETECTED Final   Parainfluenza Virus 2 NOT DETECTED NOT DETECTED Final   Parainfluenza Virus 3 NOT DETECTED NOT DETECTED Final   Parainfluenza Virus 4 NOT DETECTED NOT DETECTED Final   Respiratory Syncytial Virus NOT DETECTED NOT DETECTED Final   Bordetella pertussis NOT DETECTED NOT DETECTED Final   Bordetella Parapertussis NOT DETECTED NOT DETECTED Final   Chlamydophila pneumoniae NOT DETECTED NOT DETECTED Final   Mycoplasma pneumoniae NOT DETECTED NOT DETECTED Final    Comment: Performed at [facility], [address] [place], [place] [address]  Expectorated Sputum Assessment w Gram Stain, Rflx to Resp Cult     Status: None   Collection Time: 08/05/24  5:00 AM   Specimen: Sputum  Result Value Ref Range Status   Specimen Description SPUTUM  Final   Special Requests NONE  Final   Sputum evaluation   Final    Sputum specimen not acceptable for testing.  Please recollect.    Ava W. 08/05/2024 0803 AJ Performed at [facility], [address], [place], [place] [address]    Report Status 08/05/2024 FINAL  Final  Blood culture (routine x 2)     Status: None   Collection Time: 08/27/24 12:03 AM   Specimen: BLOOD  Result Value Ref Range Status   Specimen Description   Final    BLOOD RIGHT ANTECUBITAL Performed at [facility], [address] [place], [place] [address]    Special  Requests   Final    Blood Culture adequate volume BOTTLES DRAWN AEROBIC AND ANAEROBIC Performed at [facility], [address], [place], [place] [address]    Culture   Final    NO GROWTH 5 DAYS Performed at [facility], [address] [place], [place] [address]    Report Status 09/01/2024 FINAL  Final  Blood culture (routine x 2)     Status: None   Collection Time: 08/27/24 12:17 AM   Specimen: BLOOD  Result Value Ref Range Status   Specimen Description   Final    BLOOD BLOOD RIGHT HAND Performed at [facility], [address], [place], [place] [address]    Special Requests   Final    Blood Culture adequate volume BOTTLES DRAWN AEROBIC AND ANAEROBIC Performed at [facility], [address], [place], [place] [address]    Culture   Final    NO GROWTH 5 DAYS Performed at [facility], [address], [place], [place] [address]    Report Status 09/01/2024 FINAL  Final  Resp panel by RT-PCR (RSV, Flu A&B, Covid) Anterior Nasal Swab     Status: None   Collection Time: 08/27/24 12:26 AM   Specimen: Anterior Nasal Swab  Result Value Ref Range Status   SARS Coronavirus 2 by RT PCR NEGATIVE NEGATIVE Final    Comment: (NOTE) SARS-CoV-2 target nucleic acids are NOT DETECTED.  The SARS-CoV-2 RNA is generally detectable in upper respiratory specimens during the acute phase of infection. The lowest concentration of SARS-CoV-2 viral copies this assay can detect is 138 copies/mL. A negative result does not preclude SARS-Cov-2 infection and should not be used as the sole basis for treatment or other patient management decisions. A negative result may occur with  improper specimen collection/handling, submission of specimen other than nasopharyngeal swab, presence of viral mutation(s) within the areas targeted by this assay, and inadequate number of viral copies(<138 copies/mL). A negative result must be combined  with clinical observations, patient history, and epidemiological information. The expected result is Negative.  Fact Sheet for Patients:  bloggercourse.com  Fact Sheet for Healthcare Providers:  seriousbroker.it  This test is no t yet approved or cleared by the [place]  FDA and  has been authorized for detection and/or diagnosis of SARS-CoV-2 by FDA under an Emergency Use Authorization (EUA). This EUA will remain  in effect (meaning this test can be used) for the duration of the COVID-19 declaration under Section 564(b)(1) of the Act, 21 U.S.C.section 360bbb-3(b)(1), unless the authorization is terminated  or revoked sooner.       Influenza A by PCR NEGATIVE NEGATIVE Final   Influenza B by PCR NEGATIVE NEGATIVE Final    Comment: (NOTE) The Xpert Xpress SARS-CoV-2/FLU/RSV plus assay is intended as an aid in the diagnosis of influenza from Nasopharyngeal swab specimens and should not be used as a sole basis for treatment. Nasal washings and aspirates are unacceptable for Xpert Xpress SARS-CoV-2/FLU/RSV testing.  Fact Sheet for Patients: bloggercourse.com  Fact Sheet for Healthcare Providers: seriousbroker.it  This test is not yet approved or cleared by the [place]  FDA and has been authorized for detection and/or diagnosis of SARS-CoV-2 by FDA under an Emergency Use Authorization (EUA). This EUA will remain in effect (meaning this test can be used) for the duration of the COVID-19 declaration under Section 564(b)(1) of the Act, 21 U.S.C. section 360bbb-3(b)(1), unless the authorization is terminated or revoked.     Resp Syncytial Virus by PCR NEGATIVE NEGATIVE Final    Comment: (NOTE) Fact Sheet for Patients: bloggercourse.com  Fact Sheet for Healthcare Providers: seriousbroker.it  This test is not yet approved  or cleared by the [place]  FDA and has been authorized for detection and/or  diagnosis of SARS-CoV-2 by FDA under an Emergency Use Authorization (EUA). This EUA will remain in effect (meaning this test can be used) for the duration of the COVID-19 declaration under Section 564(b)(1) of the Act, 21 U.S.C. section 360bbb-3(b)(1), unless the authorization is terminated or revoked.  Performed at [facility], [address], [place], [place] [address]   MRSA Next Gen by PCR, Nasal     Status: None   Collection Time: 08/30/24 11:22 PM   Specimen: Nasal Mucosa; Nasal Swab  Result Value Ref Range Status   MRSA by PCR Next Gen NOT DETECTED NOT DETECTED Final    Comment: (NOTE) The GeneXpert MRSA Assay (FDA approved for NASAL specimens only), is one component of a comprehensive MRSA colonization surveillance program. It is not intended to diagnose MRSA infection nor to guide or monitor treatment for MRSA infections. Test performance is not FDA approved in patients less than 66 years old. Performed at [facility], [address], [place], [place] [address]   Blood culture (routine x 2)     Status: None (Preliminary result)   Collection Time: 08/30/24 11:56 PM   Specimen: BLOOD RIGHT ARM  Result Value Ref Range Status   Specimen Description BLOOD RIGHT ARM  Final   Special Requests   Final    BOTTLES DRAWN AEROBIC AND ANAEROBIC Blood Culture adequate volume   Culture   Final    NO GROWTH 2 DAYS Performed at [facility], [address], [place], [place] [address]    Report Status PENDING  Incomplete  Blood culture (routine x 2)     Status: None (Preliminary result)   Collection Time: 08/30/24 11:57 PM   Specimen: BLOOD LEFT ARM  Result Value Ref Range Status   Specimen Description BLOOD LEFT ARM  Final   Special Requests   Final    BOTTLES DRAWN AEROBIC AND ANAEROBIC Blood Culture adequate volume   Culture   Final    NO GROWTH 2 DAYS Performed at  [facility], [address], [place], [place] [address]    Report Status PENDING  Incomplete  Respiratory (~20 pathogens) panel by PCR     Status: None   Collection Time: 08/31/24  2:55 AM   Specimen: Nasal Mucosa; Respiratory  Result Value Ref Range Status   Adenovirus NOT DETECTED NOT DETECTED Final   Coronavirus 229E NOT DETECTED NOT DETECTED Final    Comment: (NOTE) The Coronavirus on the Respiratory Panel, DOES NOT test for the novel  Coronavirus (2019 nCoV)    Coronavirus HKU1 NOT DETECTED NOT DETECTED Final   Coronavirus NL63 NOT DETECTED NOT DETECTED Final   Coronavirus OC43 NOT DETECTED NOT DETECTED Final   Metapneumovirus NOT DETECTED NOT DETECTED Final   Rhinovirus / Enterovirus NOT DETECTED NOT DETECTED Final   Influenza A NOT DETECTED NOT DETECTED Final   Influenza B NOT DETECTED NOT DETECTED Final   Parainfluenza Virus 1 NOT DETECTED NOT DETECTED Final   Parainfluenza Virus 2 NOT DETECTED NOT DETECTED Final   Parainfluenza Virus 3 NOT DETECTED NOT DETECTED Final   Parainfluenza Virus 4 NOT DETECTED NOT DETECTED Final   Respiratory Syncytial Virus NOT DETECTED NOT DETECTED Final   Bordetella pertussis NOT DETECTED NOT DETECTED Final   Bordetella Parapertussis NOT DETECTED NOT DETECTED Final   Chlamydophila pneumoniae NOT DETECTED NOT DETECTED Final   Mycoplasma pneumoniae NOT DETECTED NOT DETECTED Final    Comment: Performed at [facility], [address], [place], [place] [address]  SARS Coronavirus 2 by RT PCR (hospital order, performed in [facility] hospital lab) *cepheid single result test* Nasal Mucosa     Status: None   Collection Time: 08/31/24  2:55 AM   Specimen: Nasal Mucosa; Nasal Swab  Result Value Ref Range Status   SARS Coronavirus 2 by RT PCR NEGATIVE NEGATIVE Final    Comment: Performed at [facility], [address] [place], [place] [address]  Aerobic/Anaerobic Culture w Gram Stain (surgical/deep wound)     Status: None  (Preliminary result)   Collection Time: 09/01/24  1:07 PM   Specimen: Bronchial Alveolar Lavage; Respiratory  Result Value Ref Range Status   Specimen Description BRONCHIAL ALVEOLAR LAVAGE  Final   Special Requests RIGHT  Final   Gram Stain   Final    WBC PRESENT, PREDOMINANTLY MONONUCLEAR NO ORGANISMS SEEN CYTOSPIN SMEAR    Culture   Final    NO GROWTH < 24 HOURS Performed at [facility], [address] [place], [place] [address]    Report Status PENDING  Incomplete  Aerobic/Anaerobic Culture w Gram Stain (surgical/deep wound)     Status: None (Preliminary result)   Collection Time: 09/01/24  1:12 PM   Specimen: Bronchial Alveolar Lavage; Respiratory  Result Value Ref Range Status   Specimen Description BRONCHIAL ALVEOLAR LAVAGE  Final   Special Requests LEFT  Final   Gram Stain   Final    WBC PRESENT,BOTH PMN AND MONONUCLEAR NO ORGANISMS SEEN CYTOSPIN SMEAR    Culture   Final    NO GROWTH < 24 HOURS Performed at [facility], [address] [place], [place] [address]    Report Status PENDING  Incomplete    Studies/Results: DG CHEST PORT 1 VIEW Result Date: 09/01/2024 EXAM: 1 VIEW XRAY OF THE CHEST 09/01/2024 02:07:00 PM COMPARISON: 08/30/2024 CLINICAL HISTORY: Status post bronchoscopy with biopsy FINDINGS: LUNGS AND PLEURA: Low lung volumes. Worsening bilateral airspace opacities. Small pleural effusions. Pulmonary nodule in left upper lung not well delineated. No pneumothorax. HEART AND MEDIASTINUM: No acute abnormality of the cardiac and mediastinal silhouettes. BONES AND SOFT TISSUES: No acute osseous abnormality. IMPRESSION: 1. Worsening bilateral airspace opacities with small pleural effusions. 2. Left upper lung pulmonary nodule not well delineated. 3. No pneumothorax identified after bronchoscopy. Electronically signed by: Waddell Calk MD 09/01/2024 03:22 PM EDT RP Workstation: [id number]   DG C-ARM BRONCHOSCOPY Result Date: 09/01/2024 C-ARM  BRONCHOSCOPY: Fluoroscopy was utilized by the requesting physician.  No radiographic interpretation.   DG C-Arm 1-60 Min-No Report Result Date: 09/01/2024 Fluoroscopy was utilized by the requesting physician.  No radiographic interpretation.      Assessment/Plan:  INTERVAL HISTORY: Status post bronchoscopy   Principal Problem:   Pneumonitis Active Problems:   Kidney cancer, primary, with metastasis from kidney to other site [facility])   Multifocal pneumonia   Essential hypertension   Anemia   Chronic kidney disease, stage II (mild)   Hypothyroidism    Michael [name] is a 58 y.o. male with renal cell carcinoma with metastasis to the brain lungs and sternum status post chemo and radiation and resection of cerebellar metastases in December, chronic kidney disease who was changed to new regimen of Optivio infusions and Xgeva .   He was on this since last February but now appears to developed a pneumonitis that is occurring after each of his last two infusions of Optivio  #1 Pneumonitis:  Based on the appearance of the airways from  the bronchoscopy report cell count differential and the overall clinical picture I have a high degree of confidence that this has nothing to do with an infectious process   We have scheduled him for an appointment for follow-up with me so we can review all of his cultures when they come back as well as serologies and antigen testing  Serum crypto ag is negative, remainder of antigen tests and culture  I personally spent a total of 51 minutes in the care of the patient today including preparing to see the patient, getting/reviewing separately obtained history, performing a medically appropriate exam/evaluation, counseling and educating, independently interpreting results, and coordinating care.    Evaluation of the patient requires complex antimicrobial therapy evaluation, counseling , isolation needs to reduce disease transmission and risk assessment and  mitigation.    Manley Fason has an appointment on 11/01/2024 at 245PM with Dr. Fleeta Rothman at  [facility] for Infectious Disease, which  is located in the [facility] at  [address] [place] in [place].  [address], which is located to the left of the elevators.  Phone: [phone]  Fax: [phone]  [email or web address]  The patient should arrive 30 minutes prior to their appoitment.  I will sign off for now  Please call with further questions.   LOS: 2 days   Jomarie Fleeta Rothman 09/02/2024, 11:53 AM

## 2024-09-02 NOTE — Telephone Encounter (Signed)
 Please make hospital follow-up in 2 weeks with me or APP.  Okay to use a held slot.

## 2024-09-02 NOTE — Discharge Summary (Signed)
 Physician Discharge Summary  Elsie Sakuma FMW:[id number] DOB: 09-07-1966 DOA: 08/30/2024  PCP: [name], MD  Admit date: 08/30/2024 Discharge date: 09/02/2024  Admitted from: Home Discharge disposition: Home  Recommendations at discharge:  You have been started on prednisone  50 mg daily until you can be reassessed at the office.  Also started on Bactrim 3 times weekly for pneumocystis prophylaxis. Follow-up with pulmonology and ID for report of serologies, antigen and culture. Home oxygen  at 2 L arranged.   Subjective: Patient was seen and examined this morning.  Pleasant middle-aged Caucasian male.  Sitting up in recliner.  On 2 L oxygen . Underwent bronchoscopy yesterday.  Postop, patient desaturated to 70s was wheezy, given IV Solu-Medrol  and an ICU Afebrile, heart rate in 50s and 60s, blood pressure normal range, Ambulated in the hallway with RN.  O2 sat ranged from 83% to 87% on room air and required 2 L oxygen  to maintain saturation more than 92%.   Brief narrative: Fate Caster is a 58 y.o. male with PMH significant for renal cell cancer with metastasis to brain, lungs, bone s/p chemo/radiation, resection of cerebellar metastasis, follows up with Dr. Sherrod, currently on immunotherapy with Opdivo  infusion and Zytiga; Also with h/o HTN, CKD, OSA, hypothyroidism. Most recently hospitalized on 9/30 -10/2 for pneumonitis, placed on antibiotics and 2 weeks tapering course of steroids. Patient felt good for about 10 days. 10/20, he got his last dose of Opdivo  infusion. He has not gradually started to feel short of breath again.  He was not able to play tennis as usual. 10/23, came to the ED. COVID and flu test were negative.  CT chest showed multifocal infiltrates, placed on antibiotics and discharged home.  At home, he continued to feel short of breath, also developed a fever of 101 and hence recommended by his oncologist to come to the ED on 10/27.  In the ED, patient had  fever of 101.3 Labs with WC count 8.4, hemoglobin 12, creatinine 1 point Chest x-ray showed stable multifocal infiltrates Culture sent Started on empiric antibiotics Admitted to [facility] Pulmonology and ID were consulted 10/20, underwent bronchoscopy.    Hospital course: Pneumonitis Presented with progressive shortness of breath, fever in the setting of cancer with metastasis under immunotherapy. Initially had a fever of 101.3.  No recurrence after that. WBC count, lactic acid has remained normal throughout. Antibiotics discontinued by ID.  10/20, underwent bronchoscopy.  Postop, patient desaturated to 70s was wheezy, given IV Solu-Medrol  and an ICU. This morning, ambulated in the hallway with RN.  O2 sat ranged from 83% to 87% on room air and required 2 L oxygen  to maintain saturation more than 92%. Per pulmonology, based on bronchoscopic findings and, cell count differential and overall clinical picture, patient does not have infectious process.   Per ID, discharged on prednisone  50 mg daily and keep at that dose until can be reassessed at the office.  Also started on Bactrim for pneumocystis prophylaxis. Patient to follow-up with pulmonology and ID for report of serologies, antigen and culture.  Recent Labs  Lab 08/26/24 2301 08/26/24 2323 08/27/24 0356 08/30/24 1717 08/30/24 1732 08/31/24 0257  WBC 8.9  --  5.9 8.4  --  7.2  LATICACIDVEN  --  1.5  --   --  0.8  --    Renal cell carcinoma with metastasis  metastasis to brain, lungs, bone s/p chemo/radiation, resection of cerebellar metastasis follows up with Dr. Sherrod currently on immunotherapy with Opdivo  infusion and Zytiga.  Hypertension  Currently controlled on amlodipine  7.5 mg daily  Hypothyroidism  Continue Synthroid   CKD 2 Creatinine at baseline Recent Labs    04/29/24 1505 05/13/24 1417 06/11/24 1306 07/19/24 0806 08/03/24 1046 08/04/24 0541 08/23/24 1410 08/26/24 2301 08/30/24 1717 08/31/24 0257  BUN  12 14 16 11 9 18 18 14 10 12   CREATININE 1.33* 1.12 1.22 1.30* 1.25* 1.13 1.23 1.37* 1.25* 1.31*  CO2 28 26 27 26 22  19* 26 24 18* 22   Anemia of chronic disease Hemoglobin slightly low but above 10.  No active bleeding.  Continue monitor Recent Labs    08/23/24 1410 08/26/24 2301 08/27/24 0356 08/30/24 1717 08/31/24 0257  HGB 13.6 12.5* 10.5* 12.0* 10.7*  MCV 80.2 83.2 84.0 80.7 82.9    Goals of care   Code Status: Full Code   Diet:  Diet Order             Diet general           Diet Heart Room service appropriate? Yes; Fluid consistency: Thin  Diet effective now                   Nutritional status:  Body mass index is 39.54 kg/m.       Wounds:  -    Discharge Medications:   Allergies as of 09/02/2024       Reactions   Cleocin [clindamycin] Rash   Penicillins Other (See Comments)   Unknown reaction in childhood   Tape Rash   Use Coban instead        Medication List     STOP taking these medications    azithromycin 250 MG tablet Commonly known as: ZITHROMAX   doxycycline  100 MG capsule Commonly known as: VIBRAMYCIN    guaiFENesin 600 MG 12 hr tablet Commonly known as: MUCINEX   ondansetron  8 MG tablet Commonly known as: Zofran        TAKE these medications    amLODipine  5 MG tablet Commonly known as: NORVASC  Take 7.5 mg by mouth daily before breakfast.   levothyroxine  150 MCG tablet Commonly known as: Synthroid  Take 1 tablet (150 mcg total) by mouth daily before breakfast.   montelukast  10 MG tablet Commonly known as: SINGULAIR  Take 10 mg by mouth daily before breakfast.   predniSONE  50 MG tablet Commonly known as: DELTASONE  Take 1 tablet (50 mg total) by mouth daily with breakfast. What changed:  medication strength how much to take how to take this when to take this additional instructions   sulfamethoxazole-trimethoprim 800-160 MG tablet Commonly known as: BACTRIM DS Take 1 tablet by mouth 3 (three) times a  week. Start taking on: September 03, 2024   tadalafil 20 MG tablet Commonly known as: CIALIS Take 10-20 mg by mouth daily as needed for erectile dysfunction.               Durable Medical Equipment  (From admission, onward)           Start     Ordered   09/02/24 1221  For home use only DME oxygen   Once       Question Answer Comment  Length of Need 6 Months   Mode or (Route) Nasal cannula   Liters per Minute 2   Frequency Continuous (stationary and portable oxygen  unit needed)   Oxygen  conserving device Yes   Oxygen  delivery system: Gas      09/02/24 1220             Follow ups:  Follow-up Information     [name], MD Follow up.   Specialty: Internal Medicine Contact information: [address] [place] [address] [phone]                 Discharge Instructions:   Discharge Instructions     Call MD for:  difficulty breathing, headache or visual disturbances   Complete by: As directed    Call MD for:  extreme fatigue   Complete by: As directed    Call MD for:  hives   Complete by: As directed    Call MD for:  persistant dizziness or light-headedness   Complete by: As directed    Call MD for:  persistant nausea and vomiting   Complete by: As directed    Call MD for:  severe uncontrolled pain   Complete by: As directed    Call MD for:  temperature >100.4   Complete by: As directed    Diet general   Complete by: As directed    Discharge instructions   Complete by: As directed    Recommendations at discharge:   You have been started on prednisone  50 mg daily until you can be reassessed at the office.  Also started on Bactrim 3 times weekly for pneumocystis prophylaxis.  Follow-up with pulmonology and ID for report of serologies, antigen and culture.  Home oxygen  at 2 L arranged.  General discharge instructions: Follow with Primary MD [name], MD in 7 days  Please request your PCP  to go over your hospital tests,  procedures, radiology results at the follow up. Please get your medicines reviewed and adjusted.  Your PCP may decide to repeat certain labs or tests as needed. Do not drive, operate heavy machinery, perform activities at heights, swimming or participation in water activities or provide baby sitting services if your were admitted for syncope or siezures until you have seen by Primary MD or a Neurologist and advised to do so again. [place]  Controlled Substance Reporting System database was reviewed. Do not drive, operate heavy machinery, perform activities at heights, swim, participate in water activities or provide baby-sitting services while on medications for pain, sleep and mood until your outpatient physician has reevaluated you and advised to do so again.  You are strongly recommended to comply with the dose, frequency and duration of prescribed medications. Activity: As tolerated with Full fall precautions use walker/cane & assistance as needed Avoid using any recreational substances like cigarette, tobacco, alcohol , or non-prescribed drug. If you experience worsening of your admission symptoms, develop shortness of breath, life threatening emergency, suicidal or homicidal thoughts you must seek medical attention immediately by calling 911 or calling your MD immediately  if symptoms less severe. You must read complete instructions/literature along with all the possible adverse reactions/side effects for all the medicines you take and that have been prescribed to you. Take any new medicine only after you have completely understood and accepted all the possible adverse reactions/side effects.  Wear Seat belts while driving. You were cared for by a hospitalist during your hospital stay. If you have any questions about your discharge medications or the care you received while you were in the hospital after you are discharged, you can call the unit and ask to speak with the hospitalist or the  covering physician. Once you are discharged, your primary care physician will handle any further medical issues. Please note that NO REFILLS for any discharge medications will be authorized once you are discharged, as it  is imperative that you return to your primary care physician (or establish a relationship with a primary care physician if you do not have one).   Increase activity slowly   Complete by: As directed        Discharge Exam:   Vitals:   09/02/24 1130 09/02/24 1145 09/02/24 1229 09/02/24 1245  BP:      Pulse:  78 80 75  Resp:  (!) 21 (!) 23 14  Temp:      TempSrc:      SpO2: 93% 92% 91% 94%  Weight:      Height:        Body mass index is 39.54 kg/m.   General exam: Pleasant, middle-aged Caucasian male.  Not in distress Skin: No rashes, lesions or ulcers. HEENT: Atraumatic, normocephalic, no obvious bleeding Lungs: Continues to have mild bibasilar crackles.  On 2 L oxygen  CVS: S1, S2, no murmur,   GI/Abd: Soft, nontender, nondistended, bowel sound present,   CNS: Alert, awake, oriented x 3 Psychiatry: Mood appropriate Extremities: No pedal edema, no calf tenderness,    The results of significant diagnostics from this hospitalization (including imaging, microbiology, ancillary and laboratory) are listed below for reference.    Procedures and Diagnostic Studies:   DG Chest 2 View Result Date: 08/30/2024 EXAM: 2 VIEW(S) XRAY OF THE CHEST 08/30/2024 05:36:00 PM COMPARISON: 08/26/2024 CLINICAL HISTORY: [id number] SOB (shortness of breath) [id number]. [name] , a 58 y.o. male was evaluated in triage. Pt complains of sob. Hx of RCC undergoing chemo therapy. Pt complains of sob. FINDINGS: LUNGS AND PLEURA: Multifocal airspace disease in the bilateral upper lobes and bilateral lower lobes, not significantly changed. No pulmonary edema. No pleural effusion. No pneumothorax. HEART AND MEDIASTINUM: No acute abnormality of the cardiac and mediastinal silhouettes. BONES AND SOFT  TISSUES: No acute osseous abnormality. IMPRESSION: 1. Stable Multifocal airspace disease in the bilateral upper lobes and bilateral lower lobes. 2. No pleural effusion or pneumothorax. Electronically signed by: Greig Pique MD 08/30/2024 06:07 PM EDT RP Workstation: [id number]     Labs:   Basic Metabolic Panel: Recent Labs  Lab 08/26/24 2301 08/30/24 1717 08/31/24 0257  NA 136 137 138  K 4.4 4.0 3.9  CL 100 105 105  CO2 24 18* 22  GLUCOSE 107* 94 120*  BUN 14 10 12   CREATININE 1.37* 1.25* 1.31*  CALCIUM 9.1 8.5* 8.3*   GFR Estimated Creatinine Clearance: 73.7 mL/min (A) (by C-G formula based on SCr of 1.31 mg/dL (H)). Liver Function Tests: Recent Labs  Lab 08/30/24 1717 08/31/24 0257  AST 23 37  ALT 31 36  ALKPHOS 68 62  BILITOT 0.7 0.8  PROT 6.6 5.8*  ALBUMIN 2.8* 2.5*   No results for input(s): LIPASE, AMYLASE in the last 168 hours. No results for input(s): AMMONIA in the last 168 hours. Coagulation profile No results for input(s): INR, PROTIME in the last 168 hours.  CBC: Recent Labs  Lab 08/26/24 2301 08/27/24 0356 08/30/24 1717 08/31/24 0257  WBC 8.9 5.9 8.4 7.2  NEUTROABS  --  4.2 6.4  --   HGB 12.5* 10.5* 12.0* 10.7*  HCT 40.1 34.1* 38.0* 34.5*  MCV 83.2 84.0 80.7 82.9  PLT 229 196 390 358   Cardiac Enzymes: No results for input(s): CKTOTAL, CKMB, CKMBINDEX, TROPONINI in the last 168 hours. BNP: Invalid input(s): POCBNP CBG: Recent Labs  Lab 09/01/24 1927 09/01/24 2312  GLUCAP 199* 195*   D-Dimer No results for input(s): DDIMER in the last 72  hours. Hgb A1c No results for input(s): HGBA1C in the last 72 hours. Lipid Profile No results for input(s): CHOL, HDL, LDLCALC, TRIG, CHOLHDL, LDLDIRECT in the last 72 hours. Thyroid  function studies No results for input(s): TSH, T4TOTAL, T3FREE, THYROIDAB in the last 72 hours.  Invalid input(s): FREET3 Anemia work up No results for input(s):  VITAMINB12, FOLATE, FERRITIN, TIBC, IRON, RETICCTPCT in the last 72 hours. Microbiology Recent Results (from the past 240 hours)  Blood culture (routine x 2)     Status: None   Collection Time: 08/27/24 12:03 AM   Specimen: BLOOD  Result Value Ref Range Status   Specimen Description   Final    BLOOD RIGHT ANTECUBITAL Performed at [facility], [address] [place], [place] [address]    Special Requests   Final    Blood Culture adequate volume BOTTLES DRAWN AEROBIC AND ANAEROBIC Performed at [facility], [address], [place], [place] [address]    Culture   Final    NO GROWTH 5 DAYS Performed at [facility], [address] [place], [place] [address]    Report Status 09/01/2024 FINAL  Final  Blood culture (routine x 2)     Status: None   Collection Time: 08/27/24 12:17 AM   Specimen: BLOOD  Result Value Ref Range Status   Specimen Description   Final    BLOOD BLOOD RIGHT HAND Performed at [facility], [address] [place], [place] [address]    Special Requests   Final    Blood Culture adequate volume BOTTLES DRAWN AEROBIC AND ANAEROBIC Performed at [facility], [address], [place], [place] [address]    Culture   Final    NO GROWTH 5 DAYS Performed at [facility], [address] [place], [place] [address]    Report Status 09/01/2024 FINAL  Final  Resp panel by RT-PCR (RSV, Flu A&B, Covid) Anterior Nasal Swab     Status: None   Collection Time: 08/27/24 12:26 AM   Specimen: Anterior Nasal Swab  Result Value Ref Range Status   SARS Coronavirus 2 by RT PCR NEGATIVE NEGATIVE Final    Comment: (NOTE) SARS-CoV-2 target nucleic acids are NOT DETECTED.  The SARS-CoV-2 RNA is generally detectable in upper respiratory specimens during the acute phase of infection. The lowest concentration of SARS-CoV-2 viral copies this assay can detect is 138 copies/mL. A negative  result does not preclude SARS-Cov-2 infection and should not be used as the sole basis for treatment or other patient management decisions. A negative result may occur with  improper specimen collection/handling, submission of specimen other than nasopharyngeal swab, presence of viral mutation(s) within the areas targeted by this assay, and inadequate number of viral copies(<138 copies/mL). A negative result must be combined with clinical observations, patient history, and epidemiological information. The expected result is Negative.  Fact Sheet for Patients:  bloggercourse.com  Fact Sheet for Healthcare Providers:  seriousbroker.it  This test is no t yet approved or cleared by the [place]  FDA and  has been authorized for detection and/or diagnosis of SARS-CoV-2 by FDA under an Emergency Use Authorization (EUA). This EUA will remain  in effect (meaning this test can be used) for the duration of the COVID-19 declaration under Section 564(b)(1) of the Act, 21 U.S.C.section 360bbb-3(b)(1), unless the authorization is terminated  or revoked sooner.       Influenza A by PCR NEGATIVE NEGATIVE Final   Influenza B  by PCR NEGATIVE NEGATIVE Final    Comment: (NOTE) The Xpert Xpress SARS-CoV-2/FLU/RSV plus assay is intended as an aid in the diagnosis of influenza from Nasopharyngeal swab specimens and should not be used as a sole basis for treatment. Nasal washings and aspirates are unacceptable for Xpert Xpress SARS-CoV-2/FLU/RSV testing.  Fact Sheet for Patients: bloggercourse.com  Fact Sheet for Healthcare Providers: seriousbroker.it  This test is not yet approved or cleared by the [place]  FDA and has been authorized for detection and/or diagnosis of SARS-CoV-2 by FDA under an Emergency Use Authorization (EUA). This EUA will remain in effect (meaning this test can be used)  for the duration of the COVID-19 declaration under Section 564(b)(1) of the Act, 21 U.S.C. section 360bbb-3(b)(1), unless the authorization is terminated or revoked.     Resp Syncytial Virus by PCR NEGATIVE NEGATIVE Final    Comment: (NOTE) Fact Sheet for Patients: bloggercourse.com  Fact Sheet for Healthcare Providers: seriousbroker.it  This test is not yet approved or cleared by the [place]  FDA and has been authorized for detection and/or diagnosis of SARS-CoV-2 by FDA under an Emergency Use Authorization (EUA). This EUA will remain in effect (meaning this test can be used) for the duration of the COVID-19 declaration under Section 564(b)(1) of the Act, 21 U.S.C. section 360bbb-3(b)(1), unless the authorization is terminated or revoked.  Performed at [facility], [address] [place], [place] [address]   MRSA Next Gen by PCR, Nasal     Status: None   Collection Time: 08/30/24 11:22 PM   Specimen: Nasal Mucosa; Nasal Swab  Result Value Ref Range Status   MRSA by PCR Next Gen NOT DETECTED NOT DETECTED Final    Comment: (NOTE) The GeneXpert MRSA Assay (FDA approved for NASAL specimens only), is one component of a comprehensive MRSA colonization surveillance program. It is not intended to diagnose MRSA infection nor to guide or monitor treatment for MRSA infections. Test performance is not FDA approved in patients less than 12 years old. Performed at [facility], [address], [place], [place] [address]   Blood culture (routine x 2)     Status: None (Preliminary result)   Collection Time: 08/30/24 11:56 PM   Specimen: BLOOD RIGHT ARM  Result Value Ref Range Status   Specimen Description BLOOD RIGHT ARM  Final   Special Requests   Final    BOTTLES DRAWN AEROBIC AND ANAEROBIC Blood Culture adequate volume   Culture   Final    NO GROWTH 2 DAYS Performed at [facility], [address], [place], [place] [address]    Report Status PENDING  Incomplete  Blood culture (routine x 2)     Status: None (Preliminary result)   Collection Time: 08/30/24 11:57 PM   Specimen: BLOOD LEFT ARM  Result Value Ref Range Status   Specimen Description BLOOD LEFT ARM  Final   Special Requests   Final    BOTTLES DRAWN AEROBIC AND ANAEROBIC Blood Culture adequate volume   Culture   Final    NO GROWTH 2 DAYS Performed at [facility], [address], [place], [place] [address]    Report Status PENDING  Incomplete  Respiratory (~20 pathogens) panel by PCR     Status: None   Collection Time: 08/31/24  2:55 AM   Specimen: Nasal Mucosa; Respiratory  Result Value Ref Range Status   Adenovirus NOT DETECTED NOT DETECTED Final   Coronavirus 229E NOT DETECTED NOT DETECTED  Final    Comment: (NOTE) The Coronavirus on the Respiratory Panel, DOES NOT test for the novel  Coronavirus (2019 nCoV)    Coronavirus HKU1 NOT DETECTED NOT DETECTED Final   Coronavirus NL63 NOT DETECTED NOT DETECTED Final   Coronavirus OC43 NOT DETECTED NOT DETECTED Final   Metapneumovirus NOT DETECTED NOT DETECTED Final   Rhinovirus / Enterovirus NOT DETECTED NOT DETECTED Final   Influenza A NOT DETECTED NOT DETECTED Final   Influenza B NOT DETECTED NOT DETECTED Final   Parainfluenza Virus 1 NOT DETECTED NOT DETECTED Final   Parainfluenza Virus 2 NOT DETECTED NOT DETECTED Final   Parainfluenza Virus 3 NOT DETECTED NOT DETECTED Final   Parainfluenza Virus 4 NOT DETECTED NOT DETECTED Final   Respiratory Syncytial Virus NOT DETECTED NOT DETECTED Final   Bordetella pertussis NOT DETECTED NOT DETECTED Final   Bordetella Parapertussis NOT DETECTED NOT DETECTED Final   Chlamydophila pneumoniae NOT DETECTED NOT DETECTED Final   Mycoplasma pneumoniae NOT DETECTED NOT DETECTED Final    Comment: Performed at [facility], [address] [place], [place] [address]  SARS Coronavirus 2 by RT PCR (hospital order,  performed in [facility] hospital lab) *cepheid single result test* Nasal Mucosa     Status: None   Collection Time: 08/31/24  2:55 AM   Specimen: Nasal Mucosa; Nasal Swab  Result Value Ref Range Status   SARS Coronavirus 2 by RT PCR NEGATIVE NEGATIVE Final    Comment: Performed at [facility], [address], [place], [place] [address]  Aerobic/Anaerobic Culture w Gram Stain (surgical/deep wound)     Status: None (Preliminary result)   Collection Time: 09/01/24  1:07 PM   Specimen: Bronchial Alveolar Lavage; Respiratory  Result Value Ref Range Status   Specimen Description BRONCHIAL ALVEOLAR LAVAGE  Final   Special Requests RIGHT  Final   Gram Stain   Final    WBC PRESENT, PREDOMINANTLY MONONUCLEAR NO ORGANISMS SEEN CYTOSPIN SMEAR    Culture   Final    NO GROWTH < 24 HOURS Performed at [facility], [address], [place], [place] [address]    Report Status PENDING  Incomplete  Aerobic/Anaerobic Culture w Gram Stain (surgical/deep wound)     Status: None (Preliminary result)   Collection Time: 09/01/24  1:12 PM   Specimen: Bronchial Alveolar Lavage; Respiratory  Result Value Ref Range Status   Specimen Description BRONCHIAL ALVEOLAR LAVAGE  Final   Special Requests LEFT  Final   Gram Stain   Final    WBC PRESENT,BOTH PMN AND MONONUCLEAR NO ORGANISMS SEEN CYTOSPIN SMEAR    Culture   Final    NO GROWTH < 24 HOURS Performed at [facility], [address], [place], [place] [address]    Report Status PENDING  Incomplete    Time coordinating discharge: 45 minutes  Signed: Turon Kilmer  [facility] 09/02/2024, 1:19 PM

## 2024-09-03 ENCOUNTER — Other Ambulatory Visit: Payer: Self-pay

## 2024-09-03 ENCOUNTER — Other Ambulatory Visit: Payer: Self-pay | Admitting: Radiation Therapy

## 2024-09-03 DIAGNOSIS — C7931 Secondary malignant neoplasm of brain: Secondary | ICD-10-CM

## 2024-09-03 LAB — ANCA PROFILE
Anti-MPO Antibodies: <0.2 U (ref 0.0–0.9)
Anti-PR3 Antibodies: <0.2 U (ref 0.0–0.9)
Atypical P-ANCA titer: <1:20 {titer}
C-ANCA: <1:20 {titer}
P-ANCA: <1:20 {titer}

## 2024-09-03 LAB — CYTOLOGY - NON PAP

## 2024-09-03 LAB — SURGICAL PATHOLOGY

## 2024-09-03 LAB — BLASTOMYCES ANTIGEN
Blastomyces Antigen: NOT DETECTED ng/mL
Interpretation: NEGATIVE

## 2024-09-03 LAB — ANTINUCLEAR ANTIBODIES, IFA: ANA Ab, IFA: NEGATIVE

## 2024-09-03 LAB — ANTI-JO 1 ANTIBODY, IGG: Anti JO-1: <0.2 AI (ref 0.0–0.9)

## 2024-09-03 LAB — SJOGRENS SYNDROME-B EXTRACTABLE NUCLEAR ANTIBODY: SSB (La) (ENA) Antibody, IgG: <0.2 AI (ref 0.0–0.9)

## 2024-09-03 LAB — ANTI-SCLERODERMA ANTIBODY: Scleroderma (Scl-70) (ENA) Antibody, IgG: <0.2 AI (ref 0.0–0.9)

## 2024-09-03 LAB — ALDOLASE: Aldolase: 15.3 U/L — ABNORMAL HIGH (ref 3.3–10.3)

## 2024-09-03 LAB — RHEUMATOID FACTOR: Rheumatoid fact SerPl-aCnc: 18.2 [IU]/mL — ABNORMAL HIGH (ref <14.0)

## 2024-09-03 LAB — SJOGRENS SYNDROME-A EXTRACTABLE NUCLEAR ANTIBODY: SSA (Ro) (ENA) Antibody, IgG: <0.2 AI (ref 0.0–0.9)

## 2024-09-03 NOTE — Telephone Encounter (Signed)
 ATC x2 LMTCB

## 2024-09-03 NOTE — Telephone Encounter (Signed)
 Attempted to call patient. Left voicemail and sent MyChart message.

## 2024-09-04 ENCOUNTER — Other Ambulatory Visit: Payer: Self-pay

## 2024-09-04 LAB — QUANTIFERON-TB GOLD PLUS (RQFGPL)
QuantiFERON Mitogen Value: 5.05 [IU]/mL
QuantiFERON Nil Value: 0.06 [IU]/mL
QuantiFERON TB1 Ag Value: 0.05 [IU]/mL
QuantiFERON TB2 Ag Value: 0.05 [IU]/mL

## 2024-09-04 LAB — ACID FAST SMEAR (AFB, MYCOBACTERIA)
Acid Fast Smear: NEGATIVE
Acid Fast Smear: NEGATIVE

## 2024-09-05 LAB — CULTURE, BLOOD (ROUTINE X 2)
Culture: NO GROWTH
Culture: NO GROWTH
Special Requests: ADEQUATE
Special Requests: ADEQUATE

## 2024-09-06 LAB — AEROBIC/ANAEROBIC CULTURE W GRAM STAIN (SURGICAL/DEEP WOUND)
Culture: NORMAL
Culture: NORMAL

## 2024-09-06 LAB — ASPERGILLUS ANTIGEN, BAL/SERUM
Aspergillus Ag, BAL/Serum: 0.05 {index} (ref 0.00–0.49)
Aspergillus Ag, BAL/Serum: 0.05 {index} (ref 0.00–0.49)

## 2024-09-06 LAB — HISTOPLASMA ANTIGEN, URINE: Histoplasma Antigen, urine: NEGATIVE (ref <0.2)

## 2024-09-06 NOTE — Telephone Encounter (Signed)
 Appointment scheduled.

## 2024-09-07 ENCOUNTER — Telehealth: Payer: Self-pay | Admitting: *Deleted

## 2024-09-07 NOTE — Telephone Encounter (Signed)
 Pt. called needing a follow up appt. after hospitalization. Per Dr. Sherrod ok to schedule during his on call time Appt. Scheduled 09/08/24 at 1330. Pt. aware of appt.

## 2024-09-08 ENCOUNTER — Inpatient Hospital Stay: Attending: Oncology | Admitting: Internal Medicine

## 2024-09-08 ENCOUNTER — Ambulatory Visit (INDEPENDENT_AMBULATORY_CARE_PROVIDER_SITE_OTHER): Admitting: Pulmonary Disease

## 2024-09-08 ENCOUNTER — Inpatient Hospital Stay: Admitting: Nurse Practitioner

## 2024-09-08 ENCOUNTER — Telehealth: Payer: Self-pay | Admitting: Pharmacist

## 2024-09-08 ENCOUNTER — Telehealth: Payer: Self-pay

## 2024-09-08 ENCOUNTER — Encounter: Payer: Self-pay | Admitting: Pulmonary Disease

## 2024-09-08 ENCOUNTER — Other Ambulatory Visit (HOSPITAL_COMMUNITY): Payer: Self-pay

## 2024-09-08 ENCOUNTER — Other Ambulatory Visit: Payer: Self-pay

## 2024-09-08 VITALS — BP 120/77 | HR 85 | Temp 97.7°F | Resp 17 | Ht 66.0 in | Wt 245.0 lb

## 2024-09-08 VITALS — BP 120/80 | HR 78 | Temp 97.7°F | Ht 66.0 in | Wt 247.0 lb

## 2024-09-08 DIAGNOSIS — R7989 Other specified abnormal findings of blood chemistry: Secondary | ICD-10-CM | POA: Diagnosis not present

## 2024-09-08 DIAGNOSIS — G939 Disorder of brain, unspecified: Secondary | ICD-10-CM | POA: Diagnosis not present

## 2024-09-08 DIAGNOSIS — Z79899 Other long term (current) drug therapy: Secondary | ICD-10-CM | POA: Diagnosis not present

## 2024-09-08 DIAGNOSIS — E039 Hypothyroidism, unspecified: Secondary | ICD-10-CM | POA: Insufficient documentation

## 2024-09-08 DIAGNOSIS — C641 Malignant neoplasm of right kidney, except renal pelvis: Secondary | ICD-10-CM | POA: Insufficient documentation

## 2024-09-08 DIAGNOSIS — J984 Other disorders of lung: Secondary | ICD-10-CM

## 2024-09-08 DIAGNOSIS — C7931 Secondary malignant neoplasm of brain: Secondary | ICD-10-CM | POA: Diagnosis not present

## 2024-09-08 DIAGNOSIS — T451X5A Adverse effect of antineoplastic and immunosuppressive drugs, initial encounter: Secondary | ICD-10-CM | POA: Insufficient documentation

## 2024-09-08 DIAGNOSIS — Z79890 Hormone replacement therapy (postmenopausal): Secondary | ICD-10-CM | POA: Diagnosis not present

## 2024-09-08 DIAGNOSIS — I10 Essential (primary) hypertension: Secondary | ICD-10-CM | POA: Diagnosis not present

## 2024-09-08 DIAGNOSIS — Z905 Acquired absence of kidney: Secondary | ICD-10-CM | POA: Insufficient documentation

## 2024-09-08 DIAGNOSIS — Z9226 Personal history of immune checkpoint inhibitor therapy: Secondary | ICD-10-CM | POA: Diagnosis not present

## 2024-09-08 DIAGNOSIS — Z881 Allergy status to other antibiotic agents status: Secondary | ICD-10-CM | POA: Diagnosis not present

## 2024-09-08 DIAGNOSIS — T380X5A Adverse effect of glucocorticoids and synthetic analogues, initial encounter: Secondary | ICD-10-CM | POA: Insufficient documentation

## 2024-09-08 DIAGNOSIS — C7800 Secondary malignant neoplasm of unspecified lung: Secondary | ICD-10-CM | POA: Diagnosis not present

## 2024-09-08 DIAGNOSIS — J704 Drug-induced interstitial lung disorders, unspecified: Secondary | ICD-10-CM | POA: Diagnosis not present

## 2024-09-08 DIAGNOSIS — R748 Abnormal levels of other serum enzymes: Secondary | ICD-10-CM | POA: Diagnosis not present

## 2024-09-08 DIAGNOSIS — Z880 Allergy status to penicillin: Secondary | ICD-10-CM | POA: Diagnosis not present

## 2024-09-08 DIAGNOSIS — Z7952 Long term (current) use of systemic steroids: Secondary | ICD-10-CM | POA: Diagnosis not present

## 2024-09-08 DIAGNOSIS — C649 Malignant neoplasm of unspecified kidney, except renal pelvis: Secondary | ICD-10-CM | POA: Diagnosis not present

## 2024-09-08 MED ORDER — PREDNISONE 10 MG PO TABS: 40.0000 mg | ORAL_TABLET | Freq: Every day | ORAL | 2 refills | Status: DC

## 2024-09-08 MED ORDER — TIVOZANIB HCL 1.34 MG PO CAPS
1.3400 mg | ORAL_CAPSULE | Freq: Every day | ORAL | 3 refills | Status: AC
  Filled 2024-09-08 – 2024-09-10 (×2): qty 21, 28d supply, fill #0
  Filled 2024-09-28: qty 21, 28d supply, fill #1
  Filled 2024-10-26 – 2024-10-29 (×2): qty 21, 28d supply, fill #2
  Filled 2024-11-24 – 2024-11-29 (×3): qty 21, 28d supply, fill #3

## 2024-09-08 NOTE — Progress Notes (Signed)
 [facility] [phone]   [phone]  OFFICE PROGRESS NOTE  Michael Rush, MD [address] [place] [address]  DIAGNOSIS: Stage IV clear-cell renal cell carcinoma initially diagnosed as locally advanced disease in 2020 with evidence of pulmonary metastasis in 2021.  PRIOR THERAPY: 1) status post right nephrectomy on August 27, 2019 in [place]  and the final pathology showed clear-cell renal cell carcinoma nuclear grade 3 with 10% necrosis and negative margin with the final pathologic stage of T2a, NX. 2) status post treatment with Keytruda  and axitinib  started March 2021 for 2 cycles interrupted because of autoimmune hepatitis.  3) axitinib  5 mg daily started July 05, 2020 discontinued after 1 week because of elevated LFTs. 4) status post cryoablation of left renal mass on May 30, 2021. 5) Keytruda  200 Mg IV every 3 weeks started July 27, 2021 and then switch it to 400 Mg every 6 weeks on November 09, 2021.  Last dose was given July 2024. 6) the patient had solitary brain metastasis in November 2024 and currently undergoing treatment with SRS and this will be followed by craniotomy and surgical resection on October 13, 2023. 7) Nivolumab   480 mg IV every 4 weeks in addition to Cabometyx  40 mg p.o daily.  Cabometyx  was discontinued secondary to elevated liver enzyme and intolerance.  He is status post 7 cycles of treatment with nivolumab .  Last dose was given on August 16, 2024.  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  CURRENT THERAPY: Tivozanib (Fotivda) 1.34 mg p.o. daily for 21 days every 4 weeks.  First dose expected in the next few days.  INTERVAL HISTORY: Michael [name] 58 y.o. male returns to the clinic today for follow-up visit accompanied by his wife.Discussed the use of AI scribe software for clinical note transcription with the patient, who gave verbal consent to proceed.  History of Present  Illness Michael [name] is a 58 year old male with stage four clear cell renal cell carcinoma with pulmonary metastasis who presents with shortness of breath. He is accompanied by his wife.  He has a history of stage four clear cell renal cell carcinoma with pulmonary metastasis, diagnosed in 2021. He has undergone several treatments, including immunotherapy and targeted therapy. He was previously on cabozantinib  but could not tolerate it due to elevated liver enzymes and has been on nivolumab  as a single agent since then.  Recently, he was admitted to the hospital with shortness of breath. His breathing has improved significantly since being discharged, and he is currently on steroids, which he takes three times a week (Monday, Wednesday, and Friday). His oxygen  levels have improved, and he is able to walk around more comfortably.  He mentions experiencing frequent hospitalizations, which he attributes to potential pneumonitis or pneumonia, although he does not have symptoms of pneumonia except for fever. Recent biopsy results have come back negative for cancer and infection.  He has been on nivolumab , but he has recently developed lung issues. He has not experienced any reactions to the medication until recently, despite being on it for several months.  While on cabozantinib , he experienced side effects such as skin changes and hair color changes, which resolved after discontinuing the medication. He is concerned about potential side effects of new medications due to his history of adverse reactions.     MEDICAL HISTORY: Past Medical History:  Diagnosis Date   Cancer [facility])    Renal cell carcinoma   Clear cell renal  cell carcinoma (HCC)    Hypertension    Hypothyroidism    Sleep apnea    wears cpap    ALLERGIES:  is allergic to cleocin [clindamycin], penicillins, and tape.  MEDICATIONS:  Current Outpatient Medications  Medication Sig Dispense Refill   amLODipine  (NORVASC ) 5 MG  tablet Take 7.5 mg by mouth daily before breakfast.     levothyroxine  (SYNTHROID ) 150 MCG tablet Take 1 tablet (150 mcg total) by mouth daily before breakfast. 30 tablet 2   montelukast  (SINGULAIR ) 10 MG tablet Take 10 mg by mouth daily before breakfast.     predniSONE  (DELTASONE ) 50 MG tablet Take 1 tablet (50 mg total) by mouth daily with breakfast. 30 tablet 0   sulfamethoxazole-trimethoprim (BACTRIM DS) 800-160 MG tablet Take 1 tablet by mouth 3 (three) times a week. 12 tablet 0   tadalafil (CIALIS) 20 MG tablet Take 10-20 mg by mouth daily as needed for erectile dysfunction.     No current facility-administered medications for this visit.    SURGICAL HISTORY:  Past Surgical History:  Procedure Laterality Date   APPLICATION OF CRANIAL NAVIGATION N/A 10/13/2023   Procedure: APPLICATION OF CRANIAL NAVIGATION;  Surgeon: Lanis Pupa, MD;  Location: [facility] OR;  Service: Neurosurgery;  Laterality: N/A;   CRANIOTOMY N/A 10/13/2023   Procedure: SUBOCCIPITAL   CRANIOTOMY TUMOR EXCISION;  Surgeon: Lanis Pupa, MD;  Location: [facility] OR;  Service: Neurosurgery;  Laterality: N/A;   IR RADIOLOGIST EVAL & MGMT  04/04/2021   no procedures performed   IR RADIOLOGIST EVAL & MGMT  05/08/2021   no procedures performed   IR RADIOLOGIST EVAL & MGMT  08/12/2022   IR RADIOLOGIST EVAL & MGMT  05/24/2024   LUNG BIOPSY  2021   RADIOLOGY WITH ANESTHESIA N/A 05/30/2021   Procedure: CT WITH ANESTHESIA CRYOABLATION;  Surgeon: Luverne Aran, MD;  Location: [facility] ORS;  Service: Radiology;  Laterality: N/A;   right nephrectomy Right 08/2019   TONSILLECTOMY     VIDEO BRONCHOSCOPY Bilateral 09/01/2024   Procedure: BRONCHOSCOPY, WITH FLUOROSCOPY;  Surgeon: Mannam, Praveen, MD;  Location: [facility] ENDOSCOPY;  Service: Cardiopulmonary;  Laterality: Bilateral;    REVIEW OF SYSTEMS:  Constitutional: positive for fatigue Eyes: negative Ears, nose, mouth, throat, and face: negative Respiratory: positive for dyspnea on  exertion Cardiovascular: negative Gastrointestinal: negative Genitourinary:negative Integument/breast: negative Hematologic/lymphatic: negative Musculoskeletal:negative Neurological: negative Behavioral/Psych: negative Endocrine: negative Allergic/Immunologic: negative   PHYSICAL EXAMINATION: General appearance: alert, cooperative, appears stated age, fatigued, and no distress Head: Normocephalic, without obvious abnormality, atraumatic Neck: no adenopathy, no JVD, supple, symmetrical, trachea midline, and thyroid  not enlarged, symmetric, no tenderness/mass/nodules Lymph nodes: Cervical, supraclavicular, and axillary nodes normal. Resp: clear to auscultation bilaterally Back: symmetric, no curvature. ROM normal. No CVA tenderness. Cardio: regular rate and rhythm, S1, S2 normal, no murmur, click, rub or gallop GI: soft, non-tender; bowel sounds normal; no masses,  no organomegaly Extremities: extremities normal, atraumatic, no cyanosis or edema Neurologic: Alert and oriented X 3, normal strength and tone. Normal symmetric reflexes. Normal coordination and gait  ECOG PERFORMANCE STATUS: 1 - Symptomatic but completely ambulatory  Blood pressure 120/77, pulse 85, temperature 97.7 F (36.5 C), temperature source Temporal, resp. rate 17, height 5' 6 (1.676 m), weight 245 lb (111.1 kg), SpO2 95%.  LABORATORY DATA: Lab Results  Component Value Date   WBC 7.2 08/31/2024   HGB 10.7 (L) 08/31/2024   HCT 34.5 (L) 08/31/2024   MCV 82.9 08/31/2024   PLT 358 08/31/2024      Chemistry  Component Value Date/Time   NA 138 08/31/2024 0257   K 3.9 08/31/2024 0257   CL 105 08/31/2024 0257   CO2 22 08/31/2024 0257   BUN 12 08/31/2024 0257   CREATININE 1.31 (H) 08/31/2024 0257   CREATININE 1.23 08/23/2024 1410      Component Value Date/Time   CALCIUM 8.3 (L) 08/31/2024 0257   ALKPHOS 62 08/31/2024 0257   AST 37 08/31/2024 0257   AST 18 08/23/2024 1410   ALT 36 08/31/2024 0257    ALT 27 08/23/2024 1410   BILITOT 0.8 08/31/2024 0257   BILITOT 0.8 08/23/2024 1410       RADIOGRAPHIC STUDIES: DG CHEST PORT 1 VIEW Result Date: 09/01/2024 EXAM: 1 VIEW XRAY OF THE CHEST 09/01/2024 02:07:00 PM COMPARISON: 08/30/2024 CLINICAL HISTORY: Status post bronchoscopy with biopsy FINDINGS: LUNGS AND PLEURA: Low lung volumes. Worsening bilateral airspace opacities. Small pleural effusions. Pulmonary nodule in left upper lung not well delineated. No pneumothorax. HEART AND MEDIASTINUM: No acute abnormality of the cardiac and mediastinal silhouettes. BONES AND SOFT TISSUES: No acute osseous abnormality. IMPRESSION: 1. Worsening bilateral airspace opacities with small pleural effusions. 2. Left upper lung pulmonary nodule not well delineated. 3. No pneumothorax identified after bronchoscopy. Electronically signed by: Waddell Calk MD 09/01/2024 03:22 PM EDT RP Workstation: [id number]   DG C-ARM BRONCHOSCOPY Result Date: 09/01/2024 C-ARM BRONCHOSCOPY: Fluoroscopy was utilized by the requesting physician.  No radiographic interpretation.   DG C-Arm 1-60 Min-No Report Result Date: 09/01/2024 Fluoroscopy was utilized by the requesting physician.  No radiographic interpretation.   DG Chest 2 View Result Date: 08/30/2024 EXAM: 2 VIEW(S) XRAY OF THE CHEST 08/30/2024 05:36:00 PM COMPARISON: 08/26/2024 CLINICAL HISTORY: [id number] SOB (shortness of breath) [id number]. Michael [name] , a 58 y.o. male was evaluated in triage. Pt complains of sob. Hx of RCC undergoing chemo therapy. Pt complains of sob. FINDINGS: LUNGS AND PLEURA: Multifocal airspace disease in the bilateral upper lobes and bilateral lower lobes, not significantly changed. No pulmonary edema. No pleural effusion. No pneumothorax. HEART AND MEDIASTINUM: No acute abnormality of the cardiac and mediastinal silhouettes. BONES AND SOFT TISSUES: No acute osseous abnormality. IMPRESSION: 1. Stable Multifocal airspace disease in the bilateral upper  lobes and bilateral lower lobes. 2. No pleural effusion or pneumothorax. Electronically signed by: Greig Pique MD 08/30/2024 06:07 PM EDT RP Workstation: [id number]   CT Angio Chest PE W and/or Wo Contrast Result Date: 08/27/2024 CLINICAL DATA:  Fevers and cough, initial encounter EXAM: CT ANGIOGRAPHY CHEST WITH CONTRAST TECHNIQUE: Multidetector CT imaging of the chest was performed using the standard protocol during bolus administration of intravenous contrast. Multiplanar CT image reconstructions and MIPs were obtained to evaluate the vascular anatomy. RADIATION DOSE REDUCTION: This exam was performed according to the departmental dose-optimization program which includes automated exposure control, adjustment of the mA and/or kV according to patient size and/or use of iterative reconstruction technique. CONTRAST:  75mL OMNIPAQUE  IOHEXOL  350 MG/ML SOLN COMPARISON:  Chest x-ray from the previous day.  CT from 07/30/2024 FINDINGS: Cardiovascular: Thoracic aorta and its branches are within normal limits. Pulmonary artery shows no pulmonary emboli. No coronary calcifications are seen. No cardiac enlargement is noted. Mediastinum/Nodes: Thoracic inlet is within normal limits. No hilar or mediastinal adenopathy is noted. The esophagus as visualized is within normal limits. Lungs/Pleura: Right-sided airspace infiltrates are identified which has an of improved somewhat in the interval from the prior CT examination. Some improved aeration in the left lower lobe is noted when compare with the prior exam although increase  in left upper lobe infiltrate is seen. There are some rounded densities identified suspicious for metastatic disease. The most prominent of these measures 7 mm in the right upper lobe best seen on image number 50 of series 12. This is slightly increased from 5.5 mm on the prior exam. Upper Abdomen: Visualized upper abdomen demonstrates cysts in the liver stable in appearance from the prior exam.  Musculoskeletal: The previously seen right manubrial lytic lesion is again identified relatively stable from the prior exam. No other rib lesions are seen. Lytic lesion in the anterior aspect of the T9 vertebral body is again noted and stable. No new vertebral lytic lesions are seen. Review of the MIP images confirms the above findings. IMPRESSION: Changes consistent with bony metastatic disease stable from the prior exam. Waxing and waning infiltrates in both lungs as described above. The left upper lobe shows the most amount of worsening infiltrate. Rounded nodular densities the largest of which lies in the right upper lobe and suspicious for metastatic disease given the clinical history. No evidence of pulmonary emboli. Electronically Signed   By: Oneil Devonshire M.D.   On: 08/27/2024 03:31   DG Chest 2 View Result Date: 08/26/2024 EXAM: 2 VIEW(S) XRAY OF THE CHEST 08/26/2024 11:44:00 PM COMPARISON: 08/03/2024 CLINICAL HISTORY: shortness of breath. Per chart - Pt complains of sob. Report having fever, chills, sob, cough today. Was seen a few weeks ago for similar and hospitalized for pna vs pneumonitis. Report feeling better until today when he was outside playing tennis in the cold. Remote hx of kidney cancer currently on immunotherapy. No hx of PE/DVT. FINDINGS: LUNGS AND PLEURA: Persistent bilateral airspace opacities. Improved aeration of right upper lobe with worsening aeration of left upper lobe. Similar airspace opacities in the bilateral lower lungs. No pulmonary edema. No pleural effusion. No pneumothorax. HEART AND MEDIASTINUM: No acute abnormality of the cardiac and mediastinal silhouettes. BONES AND SOFT TISSUES: No acute osseous abnormality. IMPRESSION: 1. Persistent bilateral airspace opacities- improved aeration of the right upper lobe and worsening aeration of the left upper lobe. Similar opacities in the bilateral lower lungs compared to 08/03/2024. Electronically signed by: Norman Gatlin MD  08/26/2024 11:57 PM EDT RP Workstation: [id number]    ASSESSMENT AND PLAN: This is a very pleasant 58 years old white male with Stage IV clear-cell renal cell carcinoma initially diagnosed as locally advanced disease in 2020 with evidence of pulmonary metastasis in 2021.  He is status post right nephrectomy on August 27, 2019 in [place]  and the final pathology showed clear-cell renal cell carcinoma nuclear grade 3 with 10% necrosis and negative margin with the final pathologic stage of T2a, NX. The patient is status post treatment with Keytruda  and axitinib  started March 2021 for 2 cycles interrupted because of autoimmune hepatitis. He then  started Axitinib  5 mg daily on July 05, 2020 discontinued after 1 week because of elevated LFTs. He is also status post cryoablation of left renal mass on May 30, 2021. The patient is currently undergoing treatment with Keytruda  200 Mg IV every 3 weeks started July 27, 2021 and then switch it to 400 Mg every 6 weeks on November 09, 2021.  He completed his treatment in July 2024. He was found to have solitary right cerebellar brain metastasis in November 2024. He started treatment with a combination of nivolumab  480 mg IV every 4 weeks and Cabometyx  40 mg p.o. daily status post 7 cycles.  Cabometyx  was discontinued secondary to intolerance.  Last dose of immunotherapy  with nivolumab  was [place] on August 16, 2024 discontinued secondary to suspicious immunotherapy mediated pneumonitis that has been recurrent recently. He had repeat CT scan of the chest during recent hospitalization that showed the persistent bilateral airspace opacities with improved aeration in the right upper lobe and worsening evaluation of the left upper lobe. Assessment and Plan Assessment & Plan Stage IV clear cell renal cell carcinoma with pulmonary metastasis Diagnosed in 2021. Recent imaging shows nodules in the lungs, likely due to inflammation rather than cancer progression.  Current treatment with nivolumab  is not tolerated due to frequent hospitalizations and suspected pneumonitis. Biopsy results negative for cancer, infection, or pneumonitis. Considering switch to tivozanib, a new oral drug, due to its more specific action and potential for better tolerance. - Will initiate tivozanib therapy 1.34 mg p.o daily for 21 days every 4 weeks. - Will monitor liver enzyme function closely. - Will schedule follow-up appointments every two weeks initially to monitor response and side effects.  Pneumonitis secondary to immunotherapy Suspected pneumonitis secondary to nivolumab , presenting with shortness of breath and fever. Symptoms improved with steroid treatment, indicating an inflammatory response rather than infection. Imaging shows inflammation in the lungs, not consistent with rapid cancer growth. - Continue treatment with daily steroid and Bactrim DS three times a week (Monday, Wednesday, Friday). - Monitor respiratory status and adjust treatment as needed.  Adverse effect of immunotherapy (nivolumab ) Adverse effects from nivolumab  include frequent hospitalizations and suspected pneumonitis. Decision made to discontinue nivolumab  due to intolerance and adverse reactions. - Discontinued nivolumab . - Educated on potential side effects of tivozanib, including allergic reactions, skin rash, bleeding issues, heart attacks, kidney injury, hypothyroidism, and stroke.  Elevated liver enzymes secondary to cabozantinib  Previous treatment with cabozantinib  resulted in elevated liver enzymes, leading to discontinuation. Tivozanib is considered as it may have a more favorable liver enzyme profile. - Will monitor liver enzyme levels closely with tivozanib therapy.  Single functioning kidney Presence of a single functioning kidney necessitates careful monitoring of kidney function, especially with new medications. - Will monitor kidney function closely with tivozanib therapy. The  patient was advised to call immediately if he has any other concerning symptoms in the interval.   The patient voices understanding of current disease status and treatment options and is in agreement with the current care plan.  All questions were answered. The patient knows to call the clinic with any problems, questions or concerns. We can certainly see the patient much sooner if necessary.  The total time spent in the appointment was 30 minutes.  Disclaimer: This note was dictated with voice recognition software. Similar sounding words can inadvertently be transcribed and may not be corrected upon review.

## 2024-09-08 NOTE — Progress Notes (Signed)
 Oral Chemotherapy Pharmacist Encounter  Patient was counseled under telephone encounter from 09/08/24.  Asberry Macintosh, PharmD, BCPS, BCOP Hematology/Oncology Clinical Pharmacist [phone] 09/08/2024 4:41 PM

## 2024-09-08 NOTE — Telephone Encounter (Signed)
 Oral Chemotherapy Pharmacist Encounter  Patient Education I spoke with patient for overview of new oral chemotherapy medication: Fotivda (tivozanib) for the treatment of metastatic clear-cell renal cell carcinoma, planned duration until disease progression or unacceptable drug toxicity.  Treatment goal: Palliative  Counseled patient on administration, dosing, side effects, monitoring, drug-food interactions, safe handling, storage, and disposal.  CMP and CBC from 08/31/24 assessed - noted patient Scr of 1.31 mg/dL (CrCl ~26.2 mL/min) - no baseline dose adjustments required at this time. Patient BP in clinic stable at 120/77 mmHg on 09/08/24. Prescription dose and frequency assessed for appropriateness.   Patient will take Fotivda 1.4 mg capsule, 1 capsule (1.34 mg total) by mouth daily for 21 days on, then off for 7 days. Repeat every 28 days.  Start date: ~09/12/24  Side effects include but not limited to: Decreased Blood Counts, Nausea/Vomiting, Diarrhea, Fatigue, Changes in electrolytes (increase glucose, decreased sodium), Hypertension, Decreased Appetite, Dysphonia. Rare but serious cardiac side effects discussed.      Side Effect Management: Nausea/Vomiting: Patient states he has not experienced any issues with nausea on previous regimens, denies wanting a PRN antiemetic at home at this time. He will alert the office if he needs an antiemetic while on Fotivda.  Diarrhea: Patient will obtain Imodium (loperamide) to have on hand if they experience diarrhea. Patient knows to alert the office of 4 or more loose stools above baseline. Hypertension: Discussed with patient and patient's wife regarding keeping a blood pressure log at home and alerting the office if patient starts having an uptrend in blood pressure readings. Patient has a history of HTN, and is currently on amlodipine .  Impaired Wound Healing: Patient is aware to alert the office if they have a major surgery planned as Fotivda would  need to be held at least 24 days prior to surgery and not resumed until adequate wound healing.   Current medication list in Epic reviewed, no relevant/significant DDIs with Fotivda identified.  Reviewed with patient importance of keeping a medication schedule and plan for any missed doses.  Patient agreement for treatment documented in MD note on 09/08/24.  After discussion with patient no patient barriers to medication adherence identified.   Distress thermometer flowsheet: Distress thermometer not completed during in person visit as patient has been on previous lines of therapy.   Communication and Learning Assessment Primary learner: Patient and patient's wife Barriers to learning: No barriers Preferred language: English Learning preferences: Listening Reading  Mr. Warwick voiced understanding and appreciation. All questions answered. Medication handout provided.  Provided patient with Oral Chemotherapy Navigation Clinic phone number. Patient knows to call the office with questions or concerns.  Asberry Macintosh, PharmD, BCPS, BCOP Hematology/Oncology Clinical Pharmacist [phone] 09/08/2024 2:43 PM

## 2024-09-08 NOTE — Telephone Encounter (Signed)
 Oral Oncology Patient Advocate Encounter  Prior Authorization for  tivozanib hcl (FOTIVDA) 1.34 MG capsule  has been approved.    PA# [id number] Effective dates: 08/09/24 through 09/08/25  Patients co-pay is $0.00.    Lucie Lamer, CPhT [place]  [facility] Specialty Pharmacy Services Oncology Pharmacy Patient Advocate Specialist [facility] [place] Phone: [phone]  Fax: [phone] [email or web address]

## 2024-09-08 NOTE — Progress Notes (Signed)
 Specialty Pharmacy Initial Fill Coordination Note  Michael [name] is a 58 y.o. male contacted today regarding refills of specialty medication(s) Tivozanib HCl (FOTIVDA) .  Patient requested Delivery  on 09/10/24  to verified address [address]   [place] [address]   Medication will be filled on 09/09/24.   Patient is aware of 0.00 copayment.

## 2024-09-08 NOTE — Telephone Encounter (Signed)
 Oral Oncology Patient Advocate Encounter   Received notification that prior authorization for tivozanib hcl (FOTIVDA) 1.34 MG capsule  is required.   PA submitted on 09/08/24 Key [id number] Status is pending     Lucie Lamer, CPhT [place]  [facility] Specialty Pharmacy Services Oncology Pharmacy Patient Advocate Specialist [facility] [place] Phone: [phone]  Fax: [phone] [email or web address]

## 2024-09-08 NOTE — Progress Notes (Signed)
 Michael [name]    [id number]    06/12/1966  Primary Care Physician:Russo, Norleen, MD  Referring Physician: Onita Norleen, MD [address] [place],  [place] [address]  Chief complaint: Follow-up for immunotherapy related pneumonitis  HPI: 58 y.o. who  has a past medical history of Cancer (HCC), Clear cell renal cell carcinoma (HCC), Hypertension, Hypothyroidism, and Sleep apnea.  Discussed the use of AI scribe software for clinical note transcription with the patient, who gave verbal consent to proceed.  History of Present Illness Michael [name] is a 58 year old male with renal cell cancer on immunotherapy with nivolumab  who presents for follow-up of respiratory issues.  Respiratory symptoms - Hospitalized for recurrent respiratory issues and respiratory failure, discharged October 30th - Bronchoscopy on October 29th showed negative microbiology.  Cytology and path shows changes of pneumonitis - Started prednisone  50 mg daily post-discharge - Significant improvement in energy levels and reduction in dyspnea - Respiratory symptoms are improving  Glucocorticoid therapy effects - On prednisone  for less than one week, previously on Medrol  during hospitalization - Experiencing increased hunger - No issues with blood sugar or diabetes  Prophylactic antibiotic use - Started Bactrim three times a week post-discharge  Oncologic history and treatment - Stage IV renal cell carcinoma - Status post right nephrectomy - History of brain tumor resected in December - Chest and spine tumors treated with stereotactic radiation - Currently receiving nivolumab  which has been discontinued and scheduled to start tivozanib for ongoing cancer management by Dr. Sherrod - Cabometyx  discontinued due to liver toxicity  Relevant Pulmonary history: Pets: Occupation: Exposures: No h/o chemo/XRT/amiodarone/macrodantin/MTX  No exposure to asbestos, silica or other organic allergens   Smoking history: Travel history: Family history:   Outpatient Encounter Medications as of 09/08/2024  Medication Sig   amLODipine  (NORVASC ) 5 MG tablet Take 7.5 mg by mouth daily before breakfast.   levothyroxine  (SYNTHROID ) 150 MCG tablet Take 1 tablet (150 mcg total) by mouth daily before breakfast.   montelukast  (SINGULAIR ) 10 MG tablet Take 10 mg by mouth daily before breakfast.   predniSONE  (DELTASONE ) 50 MG tablet Take 1 tablet (50 mg total) by mouth daily with breakfast.   sulfamethoxazole-trimethoprim (BACTRIM DS) 800-160 MG tablet Take 1 tablet by mouth 3 (three) times a week.   tadalafil (CIALIS) 20 MG tablet Take 10-20 mg by mouth daily as needed for erectile dysfunction.   tivozanib hcl (FOTIVDA) 1.34 MG capsule Take 1 capsule (1.34 mg total) by mouth daily. Take for 21 days on, then off for 7 days. Repeat every 28 days. (Patient not taking: Reported on 09/08/2024)   No facility-administered encounter medications on file as of 09/08/2024.    Physical Exam: Today's Vitals   09/08/24 1501  BP: 120/80  Pulse: 78  Temp: 97.7 F (36.5 C)  TempSrc: Oral  SpO2: 95%  Weight: 247 lb (112 kg)  Height: 5' 6 (1.676 m)   Body mass index is 39.87 kg/m.  Physical Exam GEN: No acute distress. CV: Regular rate and rhythm, no murmurs. LUNGS: Clear to auscultation bilaterally, normal respiratory effort. SKIN JOINTS: Warm and dry, no rash.   Data Reviewed: Imaging: CTA 08/27/2024-metastatic bony disease stable from before.  Waxing and waning infiltrates.  Mostly in the left upper lobe.  Rounded nodular densities I reviewed the images personally.  PFTs:  Labs: Bronchoscopy 09/01/2024 Microbiology negative Cytology, pathology-changes consistent with pneumonitis Cell count-37 to 41% neutrophils  CTD serologies 09/02/2024-positive only for aldolase of 15.3  Assessment & Plan Drug-induced pneumonitis due to immunotherapy Recurrent respiratory issues and respiratory failure  likely due to drug-induced pneumonitis from immunotherapy with nivolumab . Bronchoscopy and biopsy indicate nonspecific inflammation, likely from immunotherapy rather than radiation pneumonitis. Symptoms have improved with prednisone  and Bactrim.  CTD serologies are negative except for mildly elevated aldolase which is likely nonspecific - Continue prednisone  50 mg daily for 4 weeks, then taper by 10 mg every 2 weeks. - Continue Bactrim every Monday, Wednesday, and Friday until prednisone  is reduced to 10 mg. - Monitor for symptoms during prednisone  taper and report any issues. - Advised gradual return to physical activities, including tennis, as tolerated.  Renal cell carcinoma, status post immunotherapy, transitioning to Tivozanib Renal cell carcinoma previously treated with nivolumab , now transitioning to Tivozanib due to respiratory issues. New medication expected to avoid lung and liver complications. - Continue with Tivozanib as prescribed by oncology.  Prophylactic antibiotic therapy for immunosuppression Prophylactic antibiotic therapy with Bactrim to prevent pneumocystis pneumonia due to immunosuppression from prednisone  use. - Continue Bactrim every Monday, Wednesday, and Friday until prednisone  is reduced to 10 mg.  Recommendations: Continue prednisone  at 50 mg/day for 1 month and then start tapering by 10 mg every 2 weeks Continue Bactrim for prophylaxis   Fedrick Cefalu MD [facility] 09/08/2024, 3:08 PM  CC: [name], MD

## 2024-09-08 NOTE — Patient Instructions (Signed)
  VISIT SUMMARY: You had a follow-up visit to discuss your respiratory issues and ongoing cancer treatment. Your respiratory symptoms have improved with prednisone , and you are transitioning to a new cancer medication, Tivozanib.  YOUR PLAN: DRUG-INDUCED PNEUMONITIS DUE TO IMMUNOTHERAPY: Your recurrent respiratory issues and respiratory failure are likely due to pneumonitis caused by your previous immunotherapy treatment with nivolumab . -Continue taking prednisone  50 mg daily for 4 weeks, then taper by 10 mg every 2 weeks. -Continue taking Bactrim every Monday, Wednesday, and Friday until your prednisone  dose is reduced to 10 mg. -Monitor for any symptoms during the prednisone  taper and report any issues. -Gradually return to physical activities, including tennis, as you feel able.  RENAL CELL CARCINOMA, TRANSITIONING TO TIVOZANIB: You are transitioning from nivolumab  to Tivozanib for your renal cell carcinoma treatment to avoid lung and liver complications. -Continue with Tivozanib as prescribed by your oncology team.  PROPHYLACTIC ANTIBIOTIC THERAPY FOR IMMUNOSUPPRESSION: You are taking Bactrim to prevent pneumonia due to immunosuppression from prednisone  use. -Continue taking Bactrim every Monday, Wednesday, and Friday until your prednisone  dose is reduced to 10 mg.

## 2024-09-09 ENCOUNTER — Other Ambulatory Visit: Payer: Self-pay

## 2024-09-10 ENCOUNTER — Other Ambulatory Visit: Payer: Self-pay

## 2024-09-10 DIAGNOSIS — N1831 Chronic kidney disease, stage 3a: Secondary | ICD-10-CM | POA: Diagnosis not present

## 2024-09-10 DIAGNOSIS — C641 Malignant neoplasm of right kidney, except renal pelvis: Secondary | ICD-10-CM | POA: Diagnosis not present

## 2024-09-10 DIAGNOSIS — I129 Hypertensive chronic kidney disease with stage 1 through stage 4 chronic kidney disease, or unspecified chronic kidney disease: Secondary | ICD-10-CM | POA: Diagnosis not present

## 2024-09-10 DIAGNOSIS — E669 Obesity, unspecified: Secondary | ICD-10-CM | POA: Diagnosis not present

## 2024-09-11 ENCOUNTER — Other Ambulatory Visit (HOSPITAL_COMMUNITY): Payer: Self-pay

## 2024-09-15 ENCOUNTER — Telehealth: Payer: Self-pay | Admitting: Radiation Therapy

## 2024-09-15 NOTE — Telephone Encounter (Signed)
 Left a detailed message for patient about upcoming brain MRI and telephone visit with Ashlyn to review those results. My contact information was included requesting him to call back with any questions or conflicts.  Devere Perch R.T(R)(T) Radiation Special Procedures Lead

## 2024-09-19 NOTE — Progress Notes (Unsigned)
 [facility] OFFICE PROGRESS NOTE  [name], MD [address] [place] [address]  DIAGNOSIS: Stage IV clear-cell renal cell carcinoma initially diagnosed as locally advanced disease in 2020 with evidence of pulmonary metastasis in 2021.   PRIOR THERAPY: 1) status post right nephrectomy on August 27, 2019 in [place]  and the final pathology showed clear-cell renal cell carcinoma nuclear grade 3 with 10% necrosis and negative margin with the final pathologic stage of T2a, NX. 2) status post treatment with Keytruda  and axitinib  started March 2021 for 2 cycles interrupted because of autoimmune hepatitis.  3) axitinib  5 mg daily started July 05, 2020 discontinued after 1 week because of elevated LFTs. 4) status post cryoablation of left renal mass on May 30, 2021. 5) Keytruda  200 Mg IV every 3 weeks started July 27, 2021 and then switch it to 400 Mg every 6 weeks on November 09, 2021.  Last dose was given July 2024. 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 Lanis 7) SBRT to the right chest wall/sternum and T9 under the care of Dr. Patrcia starting next week. Last dose on 01/16/24 8) Nivolumab   480 mg IV every 4 weeks in addition to Cabometyx  40 mg p.o daily.  Cabometyx  was discontinued secondary to elevated liver enzyme and intolerance.  He is status post 7 cycles of treatment with nivolumab .  Last dose was given on August 16, 2024.  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   CURRENT THERAPY:  1) Tivozanib (Fotivda) 1.34 mg p.o. daily for 21 days every 4 weeks.  First dose on ~09/11/24 2) Xgeva  monthly   INTERVAL HISTORY: Michael [name] 58 y.o. male returns to the clinic today for a followup visit.   The patient was last seen in clinic by Dr. Sherrod on 09/08/2024.  At his last appointment his immunotherapy with nivolumab   was discontinued due to pneumonitis.  He had a bronchoscopy on 10/29 that showed negative microbiology and cytology for malignancy but showed changes related to pneumonitis therefore he was started on oral treatment with Tivozanib.  His first dose was on ~11/8 thus far he has been tolerating it well.   He has experienced a change in his voice since starting a new medication, describing it as sounding like he is sick, although he feels perfectly healthy. There is no congestion or cough, and he has never experienced a cough throughout his lung issues.  He recently played an hour and a half of tennis, noting that while he felt slightly out of shape, his breathing was about 'ninety percent' back to normal. Previously, he had difficulty climbing stairs at home, needing to take one step at a time due to breathlessness.  He is currently on a high dose of prednisone , which he started approximately three weeks ago. He feels energetic and experiences tremors, particularly after traveling for work. He also notes increased appetite and difficulty sleeping, often waking up in the middle of the night and staying awake for several hours  He is taking Bactrim while on prednisone .   He experiences occasional leg cramps at night, particularly in one leg, which he attributes to bending it a certain way during sleep. He hydrates with water when he plays tennis but is going to try to use liquid IV.   No fevers, chills, night sweats, nausea, vomiting, diarrhea, or significant swelling, although he notes some weight gain  and mild swelling after flights, which he attributes to prednisone  and travel.    He is scheduled for repeat brain MRI and follow-up with radiation oncology on 12/23. He is here today for evaluation and repeat blood work.      MEDICAL HISTORY: Past Medical History:  Diagnosis Date   Cancer [facility])    Renal cell carcinoma   Clear cell renal cell carcinoma (HCC)    Hypertension    Hypothyroidism     Sleep apnea    wears cpap    ALLERGIES:  is allergic to cleocin [clindamycin], penicillins, and tape.  MEDICATIONS:  Current Outpatient Medications  Medication Sig Dispense Refill   amLODipine  (NORVASC ) 5 MG tablet Take 7.5 mg by mouth daily before breakfast.     levothyroxine  (SYNTHROID ) 150 MCG tablet Take 1 tablet (150 mcg total) by mouth daily before breakfast. 30 tablet 2   montelukast  (SINGULAIR ) 10 MG tablet Take 10 mg by mouth daily before breakfast.     predniSONE  (DELTASONE ) 10 MG tablet Take 4 tablets (40 mg total) by mouth daily with breakfast. Starting in October 04, 2024 start taking 40 mg of prednisone  per day and then reduce dose by 10 mg every 2 weeks until taper is complete. 120 tablet 2   predniSONE  (DELTASONE ) 50 MG tablet Take 1 tablet (50 mg total) by mouth daily with breakfast. 30 tablet 0   sulfamethoxazole-trimethoprim (BACTRIM DS) 800-160 MG tablet Take 1 tablet by mouth 3 (three) times a week. 12 tablet 0   tadalafil (CIALIS) 20 MG tablet Take 10-20 mg by mouth daily as needed for erectile dysfunction.     tivozanib hcl (FOTIVDA) 1.34 MG capsule Take 1 capsule (1.34 mg total) by mouth daily. Take for 21 days on, then off for 7 days. Repeat every 28 days. (Patient not taking: Reported on 09/08/2024) 21 capsule 3   No current facility-administered medications for this visit.   Facility-Administered Medications Ordered in Other Visits  Medication Dose Route Frequency Provider Last Rate Last Admin   denosumab  (XGEVA ) injection 120 mg  120 mg Subcutaneous Once Rayona Sardinha L, PA-C        SURGICAL HISTORY:  Past Surgical History:  Procedure Laterality Date   APPLICATION OF CRANIAL NAVIGATION N/A 10/13/2023   Procedure: APPLICATION OF CRANIAL NAVIGATION;  Surgeon: Lanis Pupa, MD;  Location: [facility] OR;  Service: Neurosurgery;  Laterality: N/A;   CRANIOTOMY N/A 10/13/2023   Procedure: SUBOCCIPITAL   CRANIOTOMY TUMOR EXCISION;  Surgeon: Lanis Pupa,  MD;  Location: [facility] OR;  Service: Neurosurgery;  Laterality: N/A;   IR RADIOLOGIST EVAL & MGMT  04/04/2021   no procedures performed   IR RADIOLOGIST EVAL & MGMT  05/08/2021   no procedures performed   IR RADIOLOGIST EVAL & MGMT  08/12/2022   IR RADIOLOGIST EVAL & MGMT  05/24/2024   LUNG BIOPSY  2021   RADIOLOGY WITH ANESTHESIA N/A 05/30/2021   Procedure: CT WITH ANESTHESIA CRYOABLATION;  Surgeon: Luverne Aran, MD;  Location: [facility] ORS;  Service: Radiology;  Laterality: N/A;   right nephrectomy Right 08/2019   TONSILLECTOMY     VIDEO BRONCHOSCOPY Bilateral 09/01/2024   Procedure: BRONCHOSCOPY, WITH FLUOROSCOPY;  Surgeon: Mannam, Praveen, MD;  Location: [facility] ENDOSCOPY;  Service: Cardiopulmonary;  Laterality: Bilateral;    REVIEW OF SYSTEMS:   Review of Systems  Constitutional: Positive for weight gain. Negative for appetite change, chills,  fever and unexpected weight change.  HENT: Mild voice changes. Negative for mouth sores, nosebleeds, sore throat and trouble swallowing.  Eyes: Negative for eye problems and icterus.  Respiratory: Improved shortness of breath. Negative for cough, hemoptysis,  and wheezing.   Cardiovascular: Negative for chest pain. Mild ankle swelling. No calf pain or erythema.  Gastrointestinal: Negative for abdominal pain, constipation, diarrhea, nausea and vomiting.  Genitourinary: Negative for bladder incontinence, difficulty urinating, dysuria, frequency and hematuria.   Musculoskeletal: Negative for back pain, gait problem, neck pain and neck stiffness.  Skin: Negative for itching and rash.  Neurological: Negative for dizziness, extremity weakness, gait problem, headaches, light-headedness and seizures.  Hematological: Negative for adenopathy. Does not bruise/bleed easily.  Psychiatric/Behavioral: Negative for confusion, depression and sleep disturbance. The patient is not nervous/anxious.     PHYSICAL EXAMINATION:  Blood pressure 118/88, pulse 65, temperature 98  F (36.7 C), temperature source Temporal, resp. rate 17, height 5' 6 (1.676 m), weight 249 lb (112.9 kg), SpO2 98%.  ECOG PERFORMANCE STATUS: 1  Physical Exam  Constitutional: Oriented to person, place, and time and well-developed, well-nourished, and in no distress.  HENT:  Head: Normocephalic and atraumatic.  Mouth/Throat: Oropharynx is clear and moist. No oropharyngeal exudate.  Eyes: Conjunctivae are normal. Right eye exhibits no discharge. Left eye exhibits no discharge. No scleral icterus.  Neck: Normal range of motion. Neck supple.  Cardiovascular: Normal rate, regular rhythm, normal heart sounds and intact distal pulses.   Pulmonary/Chest: Effort normal and breath sounds normal. No respiratory distress. No wheezes. No rales.  Abdominal: Soft. Bowel sounds are normal. Exhibits no distension and no mass. There is no tenderness.  Musculoskeletal: Normal range of motion. Positive for mild ankle swelling.  Lymphadenopathy:    No cervical adenopathy.  Neurological: Alert and oriented to person, place, and time. Exhibits normal muscle tone. Gait normal. Coordination normal.  Skin: Skin is warm and dry. No rash noted. Not diaphoretic. No erythema. No pallor.  Psychiatric: Mood, memory and judgment normal.  Vitals reviewed.  LABORATORY DATA: Lab Results  Component Value Date   WBC 9.0 09/22/2024   HGB 13.9 09/22/2024   HCT 42.9 09/22/2024   MCV 81.9 09/22/2024   PLT 175 09/22/2024      Chemistry      Component Value Date/Time   NA 140 09/22/2024 0846   K 4.2 09/22/2024 0846   CL 103 09/22/2024 0846   CO2 29 09/22/2024 0846   BUN 22 (H) 09/22/2024 0846   CREATININE 1.06 09/22/2024 0846      Component Value Date/Time   CALCIUM 8.9 09/22/2024 0846   ALKPHOS 61 09/22/2024 0846   AST 28 09/22/2024 0846   ALT 56 (H) 09/22/2024 0846   BILITOT 0.4 09/22/2024 0846       RADIOGRAPHIC STUDIES:  DG CHEST PORT 1 VIEW Result Date: 09/01/2024 EXAM: 1 VIEW XRAY OF THE CHEST  09/01/2024 02:07:00 PM COMPARISON: 08/30/2024 CLINICAL HISTORY: Status post bronchoscopy with biopsy FINDINGS: LUNGS AND PLEURA: Low lung volumes. Worsening bilateral airspace opacities. Small pleural effusions. Pulmonary nodule in left upper lung not well delineated. No pneumothorax. HEART AND MEDIASTINUM: No acute abnormality of the cardiac and mediastinal silhouettes. BONES AND SOFT TISSUES: No acute osseous abnormality. IMPRESSION: 1. Worsening bilateral airspace opacities with small pleural effusions. 2. Left upper lung pulmonary nodule not well delineated. 3. No pneumothorax identified after bronchoscopy. Electronically signed by: Waddell Calk MD 09/01/2024 03:22 PM EDT RP Workstation: [id number]   DG C-ARM BRONCHOSCOPY Result Date: 09/01/2024 C-ARM BRONCHOSCOPY: Fluoroscopy was utilized by the requesting physician.  No radiographic interpretation.   DG C-Arm 1-60 Min-No Report Result Date:  09/01/2024 Fluoroscopy was utilized by the requesting physician.  No radiographic interpretation.   DG Chest 2 View Result Date: 08/30/2024 EXAM: 2 VIEW(S) XRAY OF THE CHEST 08/30/2024 05:36:00 PM COMPARISON: 08/26/2024 CLINICAL HISTORY: [id number] SOB (shortness of breath) [id number]. [name] , a 58 y.o. male was evaluated in triage. Pt complains of sob. Hx of RCC undergoing chemo therapy. Pt complains of sob. FINDINGS: LUNGS AND PLEURA: Multifocal airspace disease in the bilateral upper lobes and bilateral lower lobes, not significantly changed. No pulmonary edema. No pleural effusion. No pneumothorax. HEART AND MEDIASTINUM: No acute abnormality of the cardiac and mediastinal silhouettes. BONES AND SOFT TISSUES: No acute osseous abnormality. IMPRESSION: 1. Stable Multifocal airspace disease in the bilateral upper lobes and bilateral lower lobes. 2. No pleural effusion or pneumothorax. Electronically signed by: Greig Pique MD 08/30/2024 06:07 PM EDT RP Workstation: [id number]   CT Angio Chest PE W and/or Wo  Contrast Result Date: 08/27/2024 CLINICAL DATA:  Fevers and cough, initial encounter EXAM: CT ANGIOGRAPHY CHEST WITH CONTRAST TECHNIQUE: Multidetector CT imaging of the chest was performed using the standard protocol during bolus administration of intravenous contrast. Multiplanar CT image reconstructions and MIPs were obtained to evaluate the vascular anatomy. RADIATION DOSE REDUCTION: This exam was performed according to the departmental dose-optimization program which includes automated exposure control, adjustment of the mA and/or kV according to patient size and/or use of iterative reconstruction technique. CONTRAST:  75mL OMNIPAQUE  IOHEXOL  350 MG/ML SOLN COMPARISON:  Chest x-ray from the previous day.  CT from 07/30/2024 FINDINGS: Cardiovascular: Thoracic aorta and its branches are within normal limits. Pulmonary artery shows no pulmonary emboli. No coronary calcifications are seen. No cardiac enlargement is noted. Mediastinum/Nodes: Thoracic inlet is within normal limits. No hilar or mediastinal adenopathy is noted. The esophagus as visualized is within normal limits. Lungs/Pleura: Right-sided airspace infiltrates are identified which has an of improved somewhat in the interval from the prior CT examination. Some improved aeration in the left lower lobe is noted when compare with the prior exam although increase in left upper lobe infiltrate is seen. There are some rounded densities identified suspicious for metastatic disease. The most prominent of these measures 7 mm in the right upper lobe best seen on image number 50 of series 12. This is slightly increased from 5.5 mm on the prior exam. Upper Abdomen: Visualized upper abdomen demonstrates cysts in the liver stable in appearance from the prior exam. Musculoskeletal: The previously seen right manubrial lytic lesion is again identified relatively stable from the prior exam. No other rib lesions are seen. Lytic lesion in the anterior aspect of the T9  vertebral body is again noted and stable. No new vertebral lytic lesions are seen. Review of the MIP images confirms the above findings. IMPRESSION: Changes consistent with bony metastatic disease stable from the prior exam. Waxing and waning infiltrates in both lungs as described above. The left upper lobe shows the most amount of worsening infiltrate. Rounded nodular densities the largest of which lies in the right upper lobe and suspicious for metastatic disease given the clinical history. No evidence of pulmonary emboli. Electronically Signed   By: Oneil Devonshire M.D.   On: 08/27/2024 03:31   DG Chest 2 View Result Date: 08/26/2024 EXAM: 2 VIEW(S) XRAY OF THE CHEST 08/26/2024 11:44:00 PM COMPARISON: 08/03/2024 CLINICAL HISTORY: shortness of breath. Per chart - Pt complains of sob. Report having fever, chills, sob, cough today. Was seen a few weeks ago for similar and hospitalized for pna vs pneumonitis. Report  feeling better until today when he was outside playing tennis in the cold. Remote hx of kidney cancer currently on immunotherapy. No hx of PE/DVT. FINDINGS: LUNGS AND PLEURA: Persistent bilateral airspace opacities. Improved aeration of right upper lobe with worsening aeration of left upper lobe. Similar airspace opacities in the bilateral lower lungs. No pulmonary edema. No pleural effusion. No pneumothorax. HEART AND MEDIASTINUM: No acute abnormality of the cardiac and mediastinal silhouettes. BONES AND SOFT TISSUES: No acute osseous abnormality. IMPRESSION: 1. Persistent bilateral airspace opacities- improved aeration of the right upper lobe and worsening aeration of the left upper lobe. Similar opacities in the bilateral lower lungs compared to 08/03/2024. Electronically signed by: Norman Gatlin MD 08/26/2024 11:57 PM EDT RP Workstation: [id number]     ASSESSMENT/PLAN:  This is a very pleasant 58 year old male with stage IV clear-cell renal cell carcinoma. This was initially diagnosed with  locally advanced disease in 2020. He had evidence of pulmonary metastases in 2021. He is status post right nephrectomy on 10/23/202020 in [place] . The final pathology showed clear-cell renal cell carcinoma nuclear grade 3 with 10% necrosis and negative margins. The final pathologic stage was T2a, NX.    The patient started treatment with Keytruda  and Axitinib  that was started in March 2021.  He had 2 cycles of treatment but it was interrupted due to autoimmune hepatitis.  He then started on 5 mg daily of axitinib  on 07/05/2020 but this was also discontinued after 1 week due to elevated LFTs.   He underwent cryoablation to the left renal mass on 05/30/2021.   He then underwent treatment with Keytruda .  He was initially on 200 mg IV every 3 weeks on 07/27/2021 but then was switched to 400 mg every 6 weeks starting from 11/09/2021.   He was found to have a solitary right cerebellar brain lesion in November 2024 and underwent SRS on 10/10/2023 followed by craniotomy on 10/13/2023.   He was recently found to have disease progression and February 2024 with a lytic lesion in the upper sternum.  The patient is planning to to this lesion under the care of Dr. Patrcia as well as the T9 lesion.  The last day radiation was on 01/16/24   The patient was started back on treatment with nivolumab  480 mg every 4 weeks in addition to his oral treatment with Cabometyx  40 mg p.o. daily.    Patient had significantly elevated LFTs cabo this was discontinued/on hold.  He then had pneumonitis and his immunotherapy was discontinued in October 2025.   Therefore, he was started on treatment with Tivozanib. He started this on ~09/11/24.   Labs were reviewed. Recommend he continue on the same treatment at the same dose. He will receive his Xgeva  today.  We will see him in 3 weeks with repeat labs (he is traveling for work in 2 weeks). However, I do want to monitor his labs closely due to history of liver toxicity. Therefore, I will  arrange lab only next week.   Hypothyroidism Variable thyroid  function tests. - Ensured thyroid  function tests were drawn today. - Continue to monitor thyroid  function regularly.  Peripheral edema and leg cramps Mild swelling and leg cramps, possibly due to prednisone , amlodipine , and recent travel. - Monitor for swelling and leg cramps. - Consider compression stockings if swelling worsens. - Ensure adequate hydration and electrolyte intake. - Discussed doppler ultrasound due to cramping but it states it is mild and only occurs at night and does not feel this is needed. We discussed  symptoms that would warrant re-evaluation such as calf pain, swelling, erythema.   He will need to follow with pulmonary medicine regarding his high-dose prednisone  taper.  He will continue taking his Bactrim.  We rediscussed signs and symptoms related to his prednisone .  Drug-induced voice disorder Voice changes since starting new medication with 27% incidence of voice disorder. - Monitor voice changes and report worsening symptoms.  He tells me his colonoscopy was rescheduled for January due to his recent hospitalizations due to his lung function.  The patient was advised to call immediately if he has any concerning symptoms in the interval. The patient voices understanding of current disease status and treatment options and is in agreement with the current care plan. All questions were answered. The patient knows to call the clinic with any problems, questions or concerns. We can certainly see the patient much sooner if necessary    Orders Placed This Encounter  Procedures   TSH    Standing Status:   Standing    Number of Occurrences:   5    Expiration Date:   09/22/2025   T4, free    Standing Status:   Standing    Number of Occurrences:   5    Expiration Date:   09/22/2025   CBC with Differential (Cancer Center Only)    Standing Status:   Future    Expected Date:   10/13/2024    Expiration  Date:   09/22/2025   CMP (Cancer Center only)    Standing Status:   Future    Expected Date:   09/29/2024    Expiration Date:   09/22/2025   CMP (Cancer Center only)    Standing Status:   Future    Expected Date:   10/13/2024    Expiration Date:   09/22/2025     The total time spent in the appointment was 20-29 minutes  Maryl Blalock L Tildon Silveria, PA-C 09/22/24

## 2024-09-20 ENCOUNTER — Telehealth: Payer: Self-pay | Admitting: *Deleted

## 2024-09-20 NOTE — Telephone Encounter (Signed)
 The pt has been advised and appt has been set up for 12/01/24 at 840 am with Dr Legrand to discuss colon.  He will call if he needs us  in the meantime

## 2024-09-20 NOTE — Telephone Encounter (Signed)
 This patient was seen by our PA 5 months ago for colorectal cancer screening.  He was known to have renal cell metastatic to multiple locations on immunotherapy at that time.  His condition was reportedly stable and he was felt appropriate for colorectal cancer screening.  He is scheduled for colonoscopy later this week (09/24/2024), and his chart was just reviewed by anesthesia.  They have discovered the patient has had 2 hospitalizations in the last 2 months for progressive dyspnea and is currently on high-dose steroids and supplemental oxygen  for severe pneumonitis.  Therefore, his colonoscopy needs to be canceled.  When or if he will be appropriate for screening colonoscopy will depend on his clinical picture in the coming months.  Please contact the patient and inform him that the colonoscopy will be canceled for later this week.  I would also like him to see me in clinic in mid to late January for reevaluation of his medical condition.  VEAR Brand MD

## 2024-09-20 NOTE — Telephone Encounter (Signed)
 Dr. Legrand,  This pt is scheduled with you on 11/21 for an elective screening colonoscopy.  He was recently hospitalized with severe pulmonitis and started on a very prolonged prednisone .  We are unable to approve him for anesthetic care at [facility] until six weeks after completion of his steriod taper.  Best regards,  Norleen EMERSON Schillings

## 2024-09-21 DIAGNOSIS — Z1389 Encounter for screening for other disorder: Secondary | ICD-10-CM | POA: Diagnosis not present

## 2024-09-21 DIAGNOSIS — Z0000 Encounter for general adult medical examination without abnormal findings: Secondary | ICD-10-CM | POA: Diagnosis not present

## 2024-09-21 DIAGNOSIS — Z1331 Encounter for screening for depression: Secondary | ICD-10-CM | POA: Diagnosis not present

## 2024-09-21 DIAGNOSIS — J700 Acute pulmonary manifestations due to radiation: Secondary | ICD-10-CM | POA: Diagnosis not present

## 2024-09-22 ENCOUNTER — Inpatient Hospital Stay (HOSPITAL_BASED_OUTPATIENT_CLINIC_OR_DEPARTMENT_OTHER): Admitting: Physician Assistant

## 2024-09-22 ENCOUNTER — Inpatient Hospital Stay

## 2024-09-22 VITALS — BP 118/88 | HR 65 | Temp 98.0°F | Resp 17 | Ht 66.0 in | Wt 249.0 lb

## 2024-09-22 DIAGNOSIS — Z881 Allergy status to other antibiotic agents status: Secondary | ICD-10-CM | POA: Diagnosis not present

## 2024-09-22 DIAGNOSIS — C7800 Secondary malignant neoplasm of unspecified lung: Secondary | ICD-10-CM | POA: Diagnosis not present

## 2024-09-22 DIAGNOSIS — C649 Malignant neoplasm of unspecified kidney, except renal pelvis: Secondary | ICD-10-CM

## 2024-09-22 DIAGNOSIS — G939 Disorder of brain, unspecified: Secondary | ICD-10-CM | POA: Diagnosis not present

## 2024-09-22 DIAGNOSIS — J704 Drug-induced interstitial lung disorders, unspecified: Secondary | ICD-10-CM | POA: Diagnosis not present

## 2024-09-22 DIAGNOSIS — T451X5A Adverse effect of antineoplastic and immunosuppressive drugs, initial encounter: Secondary | ICD-10-CM | POA: Diagnosis not present

## 2024-09-22 DIAGNOSIS — T380X5A Adverse effect of glucocorticoids and synthetic analogues, initial encounter: Secondary | ICD-10-CM | POA: Diagnosis not present

## 2024-09-22 DIAGNOSIS — Z9226 Personal history of immune checkpoint inhibitor therapy: Secondary | ICD-10-CM | POA: Diagnosis not present

## 2024-09-22 DIAGNOSIS — Z5112 Encounter for antineoplastic immunotherapy: Secondary | ICD-10-CM | POA: Diagnosis not present

## 2024-09-22 DIAGNOSIS — E039 Hypothyroidism, unspecified: Secondary | ICD-10-CM | POA: Diagnosis not present

## 2024-09-22 DIAGNOSIS — C7951 Secondary malignant neoplasm of bone: Secondary | ICD-10-CM

## 2024-09-22 DIAGNOSIS — Z880 Allergy status to penicillin: Secondary | ICD-10-CM | POA: Diagnosis not present

## 2024-09-22 DIAGNOSIS — Z7952 Long term (current) use of systemic steroids: Secondary | ICD-10-CM | POA: Diagnosis not present

## 2024-09-22 DIAGNOSIS — R7989 Other specified abnormal findings of blood chemistry: Secondary | ICD-10-CM | POA: Diagnosis not present

## 2024-09-22 DIAGNOSIS — Z905 Acquired absence of kidney: Secondary | ICD-10-CM | POA: Diagnosis not present

## 2024-09-22 DIAGNOSIS — R748 Abnormal levels of other serum enzymes: Secondary | ICD-10-CM | POA: Diagnosis not present

## 2024-09-22 DIAGNOSIS — C641 Malignant neoplasm of right kidney, except renal pelvis: Secondary | ICD-10-CM | POA: Diagnosis not present

## 2024-09-22 DIAGNOSIS — Z79899 Other long term (current) drug therapy: Secondary | ICD-10-CM | POA: Diagnosis not present

## 2024-09-22 DIAGNOSIS — I10 Essential (primary) hypertension: Secondary | ICD-10-CM | POA: Diagnosis not present

## 2024-09-22 DIAGNOSIS — C7931 Secondary malignant neoplasm of brain: Secondary | ICD-10-CM | POA: Diagnosis not present

## 2024-09-22 DIAGNOSIS — Z79890 Hormone replacement therapy (postmenopausal): Secondary | ICD-10-CM | POA: Diagnosis not present

## 2024-09-22 LAB — CBC WITH DIFFERENTIAL (CANCER CENTER ONLY)
Abs Immature Granulocytes: 0.05 K/uL (ref 0.00–0.07)
Basophils Absolute: 0 K/uL (ref 0.0–0.1)
Basophils Relative: 0 %
Eosinophils Absolute: 0.1 K/uL (ref 0.0–0.5)
Eosinophils Relative: 1 %
HCT: 42.9 % (ref 39.0–52.0)
Hemoglobin: 13.9 g/dL (ref 13.0–17.0)
Immature Granulocytes: 1 %
Lymphocytes Relative: 14 %
Lymphs Abs: 1.3 K/uL (ref 0.7–4.0)
MCH: 26.5 pg (ref 26.0–34.0)
MCHC: 32.4 g/dL (ref 30.0–36.0)
MCV: 81.9 fL (ref 80.0–100.0)
Monocytes Absolute: 0.7 K/uL (ref 0.1–1.0)
Monocytes Relative: 8 %
Neutro Abs: 6.9 K/uL (ref 1.7–7.7)
Neutrophils Relative %: 76 %
Platelet Count: 175 K/uL (ref 150–400)
RBC: 5.24 MIL/uL (ref 4.22–5.81)
RDW: 21.2 % — ABNORMAL HIGH (ref 11.5–15.5)
WBC Count: 9 K/uL (ref 4.0–10.5)
nRBC: 0 % (ref 0.0–0.2)

## 2024-09-22 LAB — CMP (CANCER CENTER ONLY)
ALT: 56 U/L — ABNORMAL HIGH (ref 0–44)
AST: 28 U/L (ref 15–41)
Albumin: 3.9 g/dL (ref 3.5–5.0)
Alkaline Phosphatase: 61 U/L (ref 38–126)
Anion gap: 8 (ref 5–15)
BUN: 22 mg/dL — ABNORMAL HIGH (ref 6–20)
CO2: 29 mmol/L (ref 22–32)
Calcium: 8.9 mg/dL (ref 8.9–10.3)
Chloride: 103 mmol/L (ref 98–111)
Creatinine: 1.06 mg/dL (ref 0.61–1.24)
GFR, Estimated: >60 mL/min (ref >60)
Glucose, Bld: 84 mg/dL (ref 70–99)
Potassium: 4.2 mmol/L (ref 3.5–5.1)
Sodium: 140 mmol/L (ref 135–145)
Total Bilirubin: 0.4 mg/dL (ref 0.0–1.2)
Total Protein: 6.3 g/dL — ABNORMAL LOW (ref 6.5–8.1)

## 2024-09-22 LAB — T4, FREE: Free T4: 0.77 ng/dL (ref 0.61–1.12)

## 2024-09-22 LAB — TSH: TSH: 0.678 u[IU]/mL (ref 0.350–4.500)

## 2024-09-22 MED ORDER — DENOSUMAB 120 MG/1.7ML ~~LOC~~ SOLN
120.0000 mg | Freq: Once | SUBCUTANEOUS | Status: AC
  Administered 2024-09-22: 120 mg via SUBCUTANEOUS
  Filled 2024-09-22: qty 1.7

## 2024-09-23 ENCOUNTER — Telehealth: Payer: Self-pay | Admitting: Physician Assistant

## 2024-09-23 NOTE — Telephone Encounter (Signed)
 Scheduled patient for next appointment. Called and spoke with the patient, he is aware.

## 2024-09-24 ENCOUNTER — Other Ambulatory Visit: Payer: Self-pay

## 2024-09-24 ENCOUNTER — Encounter: Admitting: Gastroenterology

## 2024-09-27 ENCOUNTER — Other Ambulatory Visit: Payer: Self-pay | Admitting: *Deleted

## 2024-09-27 DIAGNOSIS — C641 Malignant neoplasm of right kidney, except renal pelvis: Secondary | ICD-10-CM

## 2024-09-27 DIAGNOSIS — C7951 Secondary malignant neoplasm of bone: Secondary | ICD-10-CM

## 2024-09-27 DIAGNOSIS — C649 Malignant neoplasm of unspecified kidney, except renal pelvis: Secondary | ICD-10-CM

## 2024-09-28 ENCOUNTER — Inpatient Hospital Stay

## 2024-09-28 ENCOUNTER — Other Ambulatory Visit: Payer: Self-pay

## 2024-09-28 DIAGNOSIS — Z79899 Other long term (current) drug therapy: Secondary | ICD-10-CM | POA: Diagnosis not present

## 2024-09-28 DIAGNOSIS — G939 Disorder of brain, unspecified: Secondary | ICD-10-CM | POA: Diagnosis not present

## 2024-09-28 DIAGNOSIS — T380X5A Adverse effect of glucocorticoids and synthetic analogues, initial encounter: Secondary | ICD-10-CM | POA: Diagnosis not present

## 2024-09-28 DIAGNOSIS — J704 Drug-induced interstitial lung disorders, unspecified: Secondary | ICD-10-CM | POA: Diagnosis not present

## 2024-09-28 DIAGNOSIS — R748 Abnormal levels of other serum enzymes: Secondary | ICD-10-CM | POA: Diagnosis not present

## 2024-09-28 DIAGNOSIS — Z905 Acquired absence of kidney: Secondary | ICD-10-CM | POA: Diagnosis not present

## 2024-09-28 DIAGNOSIS — Z9226 Personal history of immune checkpoint inhibitor therapy: Secondary | ICD-10-CM | POA: Diagnosis not present

## 2024-09-28 DIAGNOSIS — Z881 Allergy status to other antibiotic agents status: Secondary | ICD-10-CM | POA: Diagnosis not present

## 2024-09-28 DIAGNOSIS — E039 Hypothyroidism, unspecified: Secondary | ICD-10-CM | POA: Diagnosis not present

## 2024-09-28 DIAGNOSIS — C7951 Secondary malignant neoplasm of bone: Secondary | ICD-10-CM

## 2024-09-28 DIAGNOSIS — C7800 Secondary malignant neoplasm of unspecified lung: Secondary | ICD-10-CM | POA: Diagnosis not present

## 2024-09-28 DIAGNOSIS — R7989 Other specified abnormal findings of blood chemistry: Secondary | ICD-10-CM | POA: Diagnosis not present

## 2024-09-28 DIAGNOSIS — C649 Malignant neoplasm of unspecified kidney, except renal pelvis: Secondary | ICD-10-CM

## 2024-09-28 DIAGNOSIS — Z7952 Long term (current) use of systemic steroids: Secondary | ICD-10-CM | POA: Diagnosis not present

## 2024-09-28 DIAGNOSIS — C641 Malignant neoplasm of right kidney, except renal pelvis: Secondary | ICD-10-CM | POA: Diagnosis not present

## 2024-09-28 DIAGNOSIS — I10 Essential (primary) hypertension: Secondary | ICD-10-CM | POA: Diagnosis not present

## 2024-09-28 DIAGNOSIS — Z880 Allergy status to penicillin: Secondary | ICD-10-CM | POA: Diagnosis not present

## 2024-09-28 DIAGNOSIS — C7931 Secondary malignant neoplasm of brain: Secondary | ICD-10-CM | POA: Diagnosis not present

## 2024-09-28 DIAGNOSIS — T451X5A Adverse effect of antineoplastic and immunosuppressive drugs, initial encounter: Secondary | ICD-10-CM | POA: Diagnosis not present

## 2024-09-28 DIAGNOSIS — Z79890 Hormone replacement therapy (postmenopausal): Secondary | ICD-10-CM | POA: Diagnosis not present

## 2024-09-28 LAB — CBC WITH DIFFERENTIAL (CANCER CENTER ONLY)
Abs Immature Granulocytes: 0.05 K/uL (ref 0.00–0.07)
Basophils Absolute: 0 K/uL (ref 0.0–0.1)
Basophils Relative: 0 %
Eosinophils Absolute: 0.2 K/uL (ref 0.0–0.5)
Eosinophils Relative: 4 %
HCT: 46.2 % (ref 39.0–52.0)
Hemoglobin: 14.8 g/dL (ref 13.0–17.0)
Immature Granulocytes: 1 %
Lymphocytes Relative: 22 %
Lymphs Abs: 1.2 K/uL (ref 0.7–4.0)
MCH: 26.6 pg (ref 26.0–34.0)
MCHC: 32 g/dL (ref 30.0–36.0)
MCV: 83.1 fL (ref 80.0–100.0)
Monocytes Absolute: 0.5 K/uL (ref 0.1–1.0)
Monocytes Relative: 8 %
Neutro Abs: 3.7 K/uL (ref 1.7–7.7)
Neutrophils Relative %: 65 %
Platelet Count: 204 K/uL (ref 150–400)
RBC: 5.56 MIL/uL (ref 4.22–5.81)
RDW: 20.8 % — ABNORMAL HIGH (ref 11.5–15.5)
WBC Count: 5.6 K/uL (ref 4.0–10.5)
nRBC: 0 % (ref 0.0–0.2)

## 2024-09-28 LAB — CMP (CANCER CENTER ONLY)
ALT: 68 U/L — ABNORMAL HIGH (ref 0–44)
AST: 38 U/L (ref 15–41)
Albumin: 4.1 g/dL (ref 3.5–5.0)
Alkaline Phosphatase: 65 U/L (ref 38–126)
Anion gap: 8 (ref 5–15)
BUN: 19 mg/dL (ref 6–20)
CO2: 32 mmol/L (ref 22–32)
Calcium: 9.2 mg/dL (ref 8.9–10.3)
Chloride: 100 mmol/L (ref 98–111)
Creatinine: 1.12 mg/dL (ref 0.61–1.24)
GFR, Estimated: >60 mL/min (ref >60)
Glucose, Bld: 95 mg/dL (ref 70–99)
Potassium: 4.6 mmol/L (ref 3.5–5.1)
Sodium: 141 mmol/L (ref 135–145)
Total Bilirubin: 0.6 mg/dL (ref 0.0–1.2)
Total Protein: 6.6 g/dL (ref 6.5–8.1)

## 2024-09-28 LAB — TSH: TSH: 5.47 u[IU]/mL — ABNORMAL HIGH (ref 0.350–4.500)

## 2024-09-28 LAB — T4, FREE: Free T4: 0.83 ng/dL (ref 0.61–1.12)

## 2024-09-28 NOTE — Progress Notes (Signed)
 Specialty Pharmacy Refill Coordination Note  Michael [name] is a 58 y.o. male contacted today regarding refills of specialty medication(s) Tivozanib HCl (FOTIVDA)   Patient requested Delivery   Delivery date: 10/07/24   Verified address: [address]    [place] [address]   Medication will be filled on: 10/06/24

## 2024-09-28 NOTE — Progress Notes (Signed)
 Specialty Pharmacy Ongoing Clinical Assessment Note  Michael [name] is a 58 y.o. male who is being followed by the specialty pharmacy service for RxSp Oncology   Patient's specialty medication(s) reviewed today: Tivozanib HCl (FOTIVDA)   Missed doses in the last 4 weeks: 0   Patient/Caregiver did not have any additional questions or concerns.   Therapeutic benefit summary: Patient is achieving benefit   Adverse events/side effects summary: Experienced adverse events/side effects (voice changes, tolerable)   Patient's therapy is appropriate to: Continue    Goals Addressed             This Visit's Progress    Slow Disease Progression   No change    Patient is initiating therapy. Patient will maintain adherence         Follow up: 3 months  Silvano LOISE [name] Specialty Pharmacist

## 2024-10-03 DIAGNOSIS — J9600 Acute respiratory failure, unspecified whether with hypoxia or hypercapnia: Secondary | ICD-10-CM | POA: Diagnosis not present

## 2024-10-04 LAB — FUNGUS CULTURE WITH STAIN

## 2024-10-04 LAB — FUNGAL ORGANISM REFLEX

## 2024-10-04 LAB — FUNGUS CULTURE RESULT

## 2024-10-06 ENCOUNTER — Other Ambulatory Visit: Payer: Self-pay

## 2024-10-13 ENCOUNTER — Inpatient Hospital Stay (HOSPITAL_BASED_OUTPATIENT_CLINIC_OR_DEPARTMENT_OTHER): Admitting: Internal Medicine

## 2024-10-13 ENCOUNTER — Inpatient Hospital Stay: Attending: Oncology

## 2024-10-13 VITALS — BP 144/106 | HR 61 | Temp 97.8°F | Resp 17 | Ht 66.0 in | Wt 256.0 lb

## 2024-10-13 DIAGNOSIS — C649 Malignant neoplasm of unspecified kidney, except renal pelvis: Secondary | ICD-10-CM | POA: Diagnosis not present

## 2024-10-13 DIAGNOSIS — C641 Malignant neoplasm of right kidney, except renal pelvis: Secondary | ICD-10-CM | POA: Insufficient documentation

## 2024-10-13 DIAGNOSIS — T451X5A Adverse effect of antineoplastic and immunosuppressive drugs, initial encounter: Secondary | ICD-10-CM | POA: Insufficient documentation

## 2024-10-13 DIAGNOSIS — E039 Hypothyroidism, unspecified: Secondary | ICD-10-CM | POA: Insufficient documentation

## 2024-10-13 DIAGNOSIS — Z79899 Other long term (current) drug therapy: Secondary | ICD-10-CM | POA: Insufficient documentation

## 2024-10-13 DIAGNOSIS — R7989 Other specified abnormal findings of blood chemistry: Secondary | ICD-10-CM | POA: Insufficient documentation

## 2024-10-13 DIAGNOSIS — C7800 Secondary malignant neoplasm of unspecified lung: Secondary | ICD-10-CM | POA: Diagnosis not present

## 2024-10-13 DIAGNOSIS — K7689 Other specified diseases of liver: Secondary | ICD-10-CM | POA: Diagnosis not present

## 2024-10-13 DIAGNOSIS — C7931 Secondary malignant neoplasm of brain: Secondary | ICD-10-CM | POA: Insufficient documentation

## 2024-10-13 DIAGNOSIS — I10 Essential (primary) hypertension: Secondary | ICD-10-CM | POA: Insufficient documentation

## 2024-10-13 DIAGNOSIS — Z905 Acquired absence of kidney: Secondary | ICD-10-CM | POA: Diagnosis not present

## 2024-10-13 DIAGNOSIS — Z880 Allergy status to penicillin: Secondary | ICD-10-CM | POA: Insufficient documentation

## 2024-10-13 DIAGNOSIS — Z881 Allergy status to other antibiotic agents status: Secondary | ICD-10-CM | POA: Diagnosis not present

## 2024-10-13 DIAGNOSIS — J189 Pneumonia, unspecified organism: Secondary | ICD-10-CM | POA: Insufficient documentation

## 2024-10-13 DIAGNOSIS — C7951 Secondary malignant neoplasm of bone: Secondary | ICD-10-CM | POA: Insufficient documentation

## 2024-10-13 DIAGNOSIS — Z9226 Personal history of immune checkpoint inhibitor therapy: Secondary | ICD-10-CM | POA: Insufficient documentation

## 2024-10-13 DIAGNOSIS — J704 Drug-induced interstitial lung disorders, unspecified: Secondary | ICD-10-CM | POA: Diagnosis not present

## 2024-10-13 LAB — CBC WITH DIFFERENTIAL (CANCER CENTER ONLY)
Abs Immature Granulocytes: 0.05 K/uL (ref 0.00–0.07)
Basophils Absolute: 0 K/uL (ref 0.0–0.1)
Basophils Relative: 0 %
Eosinophils Absolute: 0.1 K/uL (ref 0.0–0.5)
Eosinophils Relative: 1 %
HCT: 47.2 % (ref 39.0–52.0)
Hemoglobin: 15.5 g/dL (ref 13.0–17.0)
Immature Granulocytes: 1 %
Lymphocytes Relative: 20 %
Lymphs Abs: 1.7 K/uL (ref 0.7–4.0)
MCH: 27.1 pg (ref 26.0–34.0)
MCHC: 32.8 g/dL (ref 30.0–36.0)
MCV: 82.7 fL (ref 80.0–100.0)
Monocytes Absolute: 0.7 K/uL (ref 0.1–1.0)
Monocytes Relative: 8 %
Neutro Abs: 6 K/uL (ref 1.7–7.7)
Neutrophils Relative %: 70 %
Platelet Count: 215 K/uL (ref 150–400)
RBC: 5.71 MIL/uL (ref 4.22–5.81)
RDW: 19.5 % — ABNORMAL HIGH (ref 11.5–15.5)
WBC Count: 8.5 K/uL (ref 4.0–10.5)
nRBC: 0 % (ref 0.0–0.2)

## 2024-10-13 LAB — CMP (CANCER CENTER ONLY)
ALT: 61 U/L — ABNORMAL HIGH (ref 0–44)
AST: 33 U/L (ref 15–41)
Albumin: 4.2 g/dL (ref 3.5–5.0)
Alkaline Phosphatase: 58 U/L (ref 38–126)
Anion gap: 11 (ref 5–15)
BUN: 19 mg/dL (ref 6–20)
CO2: 28 mmol/L (ref 22–32)
Calcium: 9.1 mg/dL (ref 8.9–10.3)
Chloride: 102 mmol/L (ref 98–111)
Creatinine: 1.02 mg/dL (ref 0.61–1.24)
GFR, Estimated: >60 mL/min (ref >60)
Glucose, Bld: 93 mg/dL (ref 70–99)
Potassium: 3.9 mmol/L (ref 3.5–5.1)
Sodium: 141 mmol/L (ref 135–145)
Total Bilirubin: 0.5 mg/dL (ref 0.0–1.2)
Total Protein: 6.7 g/dL (ref 6.5–8.1)

## 2024-10-13 LAB — T4, FREE: Free T4: 0.89 ng/dL (ref 0.61–1.12)

## 2024-10-13 LAB — TSH: TSH: 3.86 u[IU]/mL (ref 0.350–4.500)

## 2024-10-13 NOTE — Progress Notes (Signed)
 [facility] [phone]   [phone]  OFFICE PROGRESS NOTE  [name], MD [address] [place] [address]  DIAGNOSIS: Stage IV clear-cell renal cell carcinoma initially diagnosed as locally advanced disease in 2020 with evidence of pulmonary metastasis in 2021.  PRIOR THERAPY: 1) status post right nephrectomy on August 27, 2019 in [place]  and the final pathology showed clear-cell renal cell carcinoma nuclear grade 3 with 10% necrosis and negative margin with the final pathologic stage of T2a, NX. 2) status post treatment with Keytruda  and axitinib  started March 2021 for 2 cycles interrupted because of autoimmune hepatitis.  3) axitinib  5 mg daily started July 05, 2020 discontinued after 1 week because of elevated LFTs. 4) status post cryoablation of left renal mass on May 30, 2021. 5) Keytruda  200 Mg IV every 3 weeks started July 27, 2021 and then switch it to 400 Mg every 6 weeks on November 09, 2021.  Last dose was given July 2024. 6) the patient had solitary brain metastasis in November 2024 and currently undergoing treatment with SRS and this will be followed by craniotomy and surgical resection on October 13, 2023. 7) Nivolumab   480 mg IV every 4 weeks in addition to Cabometyx  40 mg p.o daily.  Cabometyx  was discontinued secondary to elevated liver enzyme and intolerance.  He is status post 7 cycles of treatment with nivolumab .  Last dose was given on August 16, 2024.  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  CURRENT THERAPY: Tivozanib (Fotivda) 1.34 mg p.o. daily for 21 days every 4 weeks.  This treatment started on September 11, 2024.  INTERVAL HISTORY: Fermin Yan 58 y.o. male returns to the clinic today for follow-up visit accompanied by his wife.Discussed the use of AI scribe software for clinical note transcription with the patient, who gave verbal consent to proceed.  History of Present  Illness Brok Stocking is a 58 year old male with stage four clear cell carcinoma who presents for evaluation and repeat blood work. He is accompanied by his wife.  He has a history of stage four clear cell carcinoma, initially diagnosed as locally advanced in 2020, with pulmonary metastasis identified in 2021. He underwent a right nephrectomy and was treated with Keytruda  and axitinib , which was later discontinued due to disease progression and elevated liver function tests. Subsequent treatment with Keytruda  continued until July 2024, after which he developed a solitary brain metastasis treated with stereotactic radiosurgery.  He was then treated with nivolumab  and cabometyx  for seven cycles, which were discontinued in October 2025 due to elevated liver enzymes. Currently, he is on tivozanib 1.34 mg twice daily for 21 days every four weeks, starting October 01, 2024. He feels good on the new treatment with no major side effects, although he notes an elevated blood pressure and a change in his voice, which he attributes to the medication. No breathing issues are reported, and he maintains an active lifestyle, playing golf and tennis, and jogging and walking regularly. His breathing is 'ninety something percent' and his oxygen  saturation is 99%.  He is currently on prednisone , having started at 50 mg and self-reduced to 30 mg as of October 04, 2024. He reports a weight gain of 10 pounds in a month, which he attributes to increased appetite from the prednisone . He is scheduled to see his pulmonologist on November 09, 2024.  Recent blood work shows an ALT level of 61, which has decreased from previous levels of  68 and 59. His creatinine is at 1.02, the lowest it has been. He continues to travel for work and maintains a social lifestyle, including dining out and enjoying ice cream. No breathing issues and his oxygen  saturation is 99%. He notes a change in his voice but no cold symptoms.     MEDICAL  HISTORY: Past Medical History:  Diagnosis Date   Cancer [facility])    Renal cell carcinoma   Clear cell renal cell carcinoma (HCC)    Hypertension    Hypothyroidism    Sleep apnea    wears cpap    ALLERGIES:  is allergic to cleocin [clindamycin], penicillins, and tape.  MEDICATIONS:  Current Outpatient Medications  Medication Sig Dispense Refill   amLODipine  (NORVASC ) 5 MG tablet Take 7.5 mg by mouth daily before breakfast.     levothyroxine  (SYNTHROID ) 150 MCG tablet Take 1 tablet (150 mcg total) by mouth daily before breakfast. 30 tablet 2   montelukast  (SINGULAIR ) 10 MG tablet Take 10 mg by mouth daily before breakfast.     predniSONE  (DELTASONE ) 10 MG tablet Take 4 tablets (40 mg total) by mouth daily with breakfast. Starting in October 04, 2024 start taking 40 mg of prednisone  per day and then reduce dose by 10 mg every 2 weeks until taper is complete. 120 tablet 2   predniSONE  (DELTASONE ) 50 MG tablet Take 1 tablet (50 mg total) by mouth daily with breakfast. 30 tablet 0   tadalafil (CIALIS) 20 MG tablet Take 10-20 mg by mouth daily as needed for erectile dysfunction.     tivozanib hcl (FOTIVDA) 1.34 MG capsule Take 1 capsule (1.34 mg total) by mouth daily. Take for 21 days on, then off for 7 days. Repeat every 28 days. (Patient not taking: Reported on 09/08/2024) 21 capsule 3   No current facility-administered medications for this visit.    SURGICAL HISTORY:  Past Surgical History:  Procedure Laterality Date   APPLICATION OF CRANIAL NAVIGATION N/A 10/13/2023   Procedure: APPLICATION OF CRANIAL NAVIGATION;  Surgeon: Lanis Pupa, MD;  Location: [facility];  Service: Neurosurgery;  Laterality: N/A;   CRANIOTOMY N/A 10/13/2023   Procedure: SUBOCCIPITAL   CRANIOTOMY TUMOR EXCISION;  Surgeon: Lanis Pupa, MD;  Location: [facility] OR;  Service: Neurosurgery;  Laterality: N/A;   IR RADIOLOGIST EVAL & MGMT  04/04/2021   no procedures performed   IR RADIOLOGIST EVAL & MGMT  05/08/2021    no procedures performed   IR RADIOLOGIST EVAL & MGMT  08/12/2022   IR RADIOLOGIST EVAL & MGMT  05/24/2024   LUNG BIOPSY  2021   RADIOLOGY WITH ANESTHESIA N/A 05/30/2021   Procedure: CT WITH ANESTHESIA CRYOABLATION;  Surgeon: Luverne Aran, MD;  Location: [facility];  Service: Radiology;  Laterality: N/A;   right nephrectomy Right 08/2019   TONSILLECTOMY     VIDEO BRONCHOSCOPY Bilateral 09/01/2024   Procedure: BRONCHOSCOPY, WITH FLUOROSCOPY;  Surgeon: Mannam, Praveen, MD;  Location: [facility];  Service: Cardiopulmonary;  Laterality: Bilateral;    REVIEW OF SYSTEMS:  A comprehensive review of systems was negative.   PHYSICAL EXAMINATION: General appearance: alert, cooperative, appears stated age, and no distress Head: Normocephalic, without obvious abnormality, atraumatic Neck: no adenopathy, no JVD, supple, symmetrical, trachea midline, and thyroid  not enlarged, symmetric, no tenderness/mass/nodules Lymph nodes: Cervical, supraclavicular, and axillary nodes normal. Resp: clear to auscultation bilaterally Back: symmetric, no curvature. ROM normal. No CVA tenderness. Cardio: regular rate and rhythm, S1, S2 normal, no murmur, click, rub or gallop GI: soft, non-tender; bowel sounds  normal; no masses,  no organomegaly Extremities: extremities normal, atraumatic, no cyanosis or edema  ECOG PERFORMANCE STATUS: 1 - Symptomatic but completely ambulatory  Blood pressure (!) 144/106, pulse 61, temperature 97.8 F (36.6 C), temperature source Temporal, resp. rate 17, height 5' 6 (1.676 m), weight 256 lb (116.1 kg), SpO2 99%.  LABORATORY DATA: Lab Results  Component Value Date   WBC 8.5 10/13/2024   HGB 15.5 10/13/2024   HCT 47.2 10/13/2024   MCV 82.7 10/13/2024   PLT 215 10/13/2024      Chemistry      Component Value Date/Time   NA 141 09/28/2024 0839   K 4.6 09/28/2024 0839   CL 100 09/28/2024 0839   CO2 32 09/28/2024 0839   BUN 19 09/28/2024 0839   CREATININE 1.12 09/28/2024 0839       Component Value Date/Time   CALCIUM 9.2 09/28/2024 0839   ALKPHOS 65 09/28/2024 0839   AST 38 09/28/2024 0839   ALT 68 (H) 09/28/2024 0839   BILITOT 0.6 09/28/2024 0839       RADIOGRAPHIC STUDIES: No results found.   ASSESSMENT AND PLAN: This is a very pleasant 58 years old white male with Stage IV clear-cell renal cell carcinoma initially diagnosed as locally advanced disease in 2020 with evidence of pulmonary metastasis in 2021.  He is status post right nephrectomy on August 27, 2019 in [place]  and the final pathology showed clear-cell renal cell carcinoma nuclear grade 3 with 10% necrosis and negative margin with the final pathologic stage of T2a, NX. The patient is status post treatment with Keytruda  and axitinib  started March 2021 for 2 cycles interrupted because of autoimmune hepatitis. He then  started Axitinib  5 mg daily on July 05, 2020 discontinued after 1 week because of elevated LFTs. He is also status post cryoablation of left renal mass on May 30, 2021. The patient is currently undergoing treatment with Keytruda  200 Mg IV every 3 weeks started July 27, 2021 and then switch it to 400 Mg every 6 weeks on November 09, 2021.  He completed his treatment in July 2024. He was found to have solitary right cerebellar brain metastasis in November 2024. He started treatment with a combination of nivolumab  480 mg IV every 4 weeks and Cabometyx  40 mg p.o. daily status post 7 cycles.  Cabometyx  was discontinued secondary to intolerance.  Last dose of immunotherapy with nivolumab  was [place] on August 16, 2024 discontinued secondary to suspicious immunotherapy mediated pneumonitis that has been recurrent recently. He is currently on treatment with Tivozanib (Fotivda) 1.34 mg p.o. daily for 21 days every 4 weeks status post 4 months of treatment.  He is tolerating his treatment fairly well. Assessment and Plan Assessment & Plan Stage IV clear cell renal cell carcinoma with  metastases Stage IV clear cell renal cell carcinoma with pulmonary and brain metastases. Currently on tivozanib 1.34 mg BID for 21 days every 4 weeks since November 28th, 2025. Reports feeling good with no major side effects except for hoarseness. No breathing issues and maintains good physical activity levels. Blood count is normal, and creatinine is at its lowest recorded level at 1.02. - Continue tivozanib 1.34 mg BID for 21 days every 4 weeks. - Ordered repeat scan to assess treatment response. - Scheduled MRI for brain between Christmas and New Year's. - Plan follow-up visit in January with scan a week prior.  Immunotherapy-related pneumonitis Managed with prednisone . Currently on prednisone , self-reduced dose from 50 mg to 30 mg. Reports good breathing  and oxygen  saturation at 99%. Plans to taper off prednisone  further with pulmonologist's guidance. - Continue tapering prednisone  with pulmonologist's guidance. - Follow up with pulmonologist on January 6th.  Elevated liver enzymes secondary to cancer therapy Elevated liver enzymes secondary to cancer therapy. ALT previously elevated but currently at 61, down from 59. Monitoring liver function as prednisone  is tapered. - Continue monitoring liver function tests. The patient was advised to call immediately if he has any concerning symptoms in the interval. The patient voices understanding of current disease status and treatment options and is in agreement with the current care plan.  All questions were answered. The patient knows to call the clinic with any problems, questions or concerns. We can certainly see the patient much sooner if necessary.  The total time spent in the appointment was 20 minutes.  Disclaimer: This note was dictated with voice recognition software. Similar sounding words can inadvertently be transcribed and may not be corrected upon review.

## 2024-10-14 ENCOUNTER — Telehealth: Payer: Self-pay | Admitting: Internal Medicine

## 2024-10-14 NOTE — Telephone Encounter (Signed)
 Scheduled patient for next appointments. Called and spoke with the patient, he is aware of the appointments.

## 2024-10-15 ENCOUNTER — Other Ambulatory Visit: Payer: Self-pay | Admitting: Internal Medicine

## 2024-10-16 LAB — ACID FAST CULTURE WITH REFLEXED SENSITIVITIES (MYCOBACTERIA)
Acid Fast Culture: NEGATIVE
Acid Fast Culture: NEGATIVE

## 2024-10-17 ENCOUNTER — Encounter: Payer: Self-pay | Admitting: Physician Assistant

## 2024-10-20 NOTE — Progress Notes (Signed)
 Follow up call to receive results from brain MRI on 10/26/24.  Patient declines any pain or neurological issues today but does want to note that the spot of his surgery and radiation on his neck feels like it is sinking in. Just wanted to see if this was normal? Nursing interview completed.    IMPRESSION: 1. Post-treatment changes in the cerebellum without evidence of locally recurrent or new intracranial metastatic disease.

## 2024-10-21 ENCOUNTER — Inpatient Hospital Stay

## 2024-10-21 ENCOUNTER — Inpatient Hospital Stay: Admitting: Internal Medicine

## 2024-10-22 ENCOUNTER — Other Ambulatory Visit (HOSPITAL_COMMUNITY): Payer: Self-pay

## 2024-10-26 ENCOUNTER — Ambulatory Visit (HOSPITAL_COMMUNITY)
Admission: RE | Admit: 2024-10-26 | Discharge: 2024-10-26 | Disposition: A | Discharge status: Home / Self Care | Source: Ambulatory Visit | Attending: Radiation Oncology | Admitting: Radiation Oncology

## 2024-10-26 ENCOUNTER — Other Ambulatory Visit (HOSPITAL_COMMUNITY): Payer: Self-pay

## 2024-10-26 DIAGNOSIS — C7931 Secondary malignant neoplasm of brain: Secondary | ICD-10-CM | POA: Insufficient documentation

## 2024-10-26 DIAGNOSIS — R930 Abnormal findings on diagnostic imaging of skull and head, not elsewhere classified: Secondary | ICD-10-CM | POA: Diagnosis not present

## 2024-10-26 MED ORDER — GADOBUTROL 1 MMOL/ML IV SOLN
10.0000 mL | Freq: Once | INTRAVENOUS | Status: AC | PRN
  Administered 2024-10-26: 10 mL via INTRAVENOUS

## 2024-10-27 ENCOUNTER — Other Ambulatory Visit: Payer: Self-pay

## 2024-10-29 ENCOUNTER — Other Ambulatory Visit: Payer: Self-pay

## 2024-10-31 ENCOUNTER — Encounter: Payer: Self-pay | Admitting: Infectious Disease

## 2024-10-31 DIAGNOSIS — J984 Other disorders of lung: Secondary | ICD-10-CM | POA: Insufficient documentation

## 2024-10-31 NOTE — Progress Notes (Deleted)
 "  Subjective:  Chief Complaint: follow-up for chemotherapy induced pneumonitis   Patient ID: Michael [name], male    DOB: Sep 09, 1966, 58 y.o.   MRN: [id number]  HPI  Past Medical History:  Diagnosis Date   Cancer (HCC)    Renal cell carcinoma   Clear cell renal cell carcinoma (HCC)    Hypertension    Hypothyroidism    Sleep apnea    wears cpap    Past Surgical History:  Procedure Laterality Date   APPLICATION OF CRANIAL NAVIGATION N/A 10/13/2023   Procedure: APPLICATION OF CRANIAL NAVIGATION;  Surgeon: Lanis Pupa, MD;  [facility];  Service: Neurosurgery;  Laterality: N/A;   CRANIOTOMY N/A 10/13/2023   Procedure: SUBOCCIPITAL   CRANIOTOMY TUMOR EXCISION;  Surgeon: Lanis Pupa, MD;  [facility]: [facility];  Service: Neurosurgery;  Laterality: N/A;   IR RADIOLOGIST EVAL & MGMT  04/04/2021   no procedures performed   IR RADIOLOGIST EVAL & MGMT  05/08/2021   no procedures performed   IR RADIOLOGIST EVAL & MGMT  08/12/2022   IR RADIOLOGIST EVAL & MGMT  05/24/2024   LUNG BIOPSY  2021   RADIOLOGY WITH ANESTHESIA N/A 05/30/2021   Procedure: CT WITH ANESTHESIA CRYOABLATION;  Surgeon: Luverne Aran, MD;  Location: [facility];  Service: Radiology;  Laterality: N/A;   right nephrectomy Right 08/2019   TONSILLECTOMY     VIDEO BRONCHOSCOPY Bilateral 09/01/2024   Procedure: BRONCHOSCOPY, WITH FLUOROSCOPY;  Surgeon: Mannam, Praveen, MD;  Location: [facility];  Service: Cardiopulmonary;  Laterality: Bilateral;    Family History  Problem Relation Age of Onset   Liver disease Neg Hx    Colon cancer Neg Hx    Esophageal cancer Neg Hx       Social History   Socioeconomic History   Marital status: Married    Spouse name: Not on file   Number of children: 3   Years of education: Not on file   Highest education level: Not on file  Occupational History   Not on file  Tobacco Use   Smoking status: Never   Smokeless tobacco: Never  Vaping Use   Vaping status: Never Used   Substance and Sexual Activity   Alcohol  use: Not Currently    Comment: occasional beer or wine 1 x a month   Drug use: Never   Sexual activity: Yes    Birth control/protection: None  Other Topics Concern   Not on file  Social History Narrative   Not on file   Social Drivers of Health   Tobacco Use: Low Risk (09/08/2024)   Patient History    Smoking Tobacco Use: Never    Smokeless Tobacco Use: Never    Passive Exposure: Not on file  Financial Resource Strain: Not on file  Food Insecurity: No Food Insecurity (08/31/2024)   Epic    Worried About Programme Researcher, Broadcasting/film/video in the Last Year: Never true    Ran Out of Food in the Last Year: Never true  Transportation Needs: No Transportation Needs (08/31/2024)   Epic    Lack of Transportation (Medical): No    Lack of Transportation (Non-Medical): No  Physical Activity: Not on file  Stress: Not on file  Social Connections: Not on file  Depression (PHQ2-9): Low Risk (06/11/2024)   Depression (PHQ2-9)    PHQ-2 Score: 0  Alcohol  Screen: Low Risk (10/07/2023)   Alcohol  Screen    Last Alcohol  Screening Score (AUDIT): 1  Housing: Low Risk (08/31/2024)   Epic  Unable to Pay for Housing in the Last Year: No    Number of Times Moved in the Last Year: 0    Homeless in the Last Year: No  Utilities: Not At Risk (08/31/2024)   Epic    Threatened with loss of utilities: No  Health Literacy: Not on file    Allergies[1]  Current Medications[2]   Review of Systems     Objective:   Physical Exam        Assessment & Plan:       [1]  Allergies Allergen Reactions   Cleocin [Clindamycin] Rash   Penicillins Other (See Comments)    Unknown reaction in childhood   Tape Rash    Use Coban instead  [2]  Current Outpatient Medications:    amLODipine  (NORVASC ) 5 MG tablet, Take 7.5 mg by mouth daily before breakfast., Disp: , Rfl:    levothyroxine  (SYNTHROID ) 150 MCG tablet, TAKE 1 TABLET(150 MCG) BY MOUTH DAILY BEFORE BREAKFAST,  Disp: 30 tablet, Rfl: 2   montelukast  (SINGULAIR ) 10 MG tablet, Take 10 mg by mouth daily before breakfast., Disp: , Rfl:    predniSONE  (DELTASONE ) 10 MG tablet, Take 4 tablets (40 mg total) by mouth daily with breakfast. Starting in October 04, 2024 start taking 40 mg of prednisone  per day and then reduce dose by 10 mg every 2 weeks until taper is complete., Disp: 120 tablet, Rfl: 2   predniSONE  (DELTASONE ) 50 MG tablet, Take 1 tablet (50 mg total) by mouth daily with breakfast., Disp: 30 tablet, Rfl: 0   tadalafil (CIALIS) 20 MG tablet, Take 10-20 mg by mouth daily as needed for erectile dysfunction., Disp: , Rfl:    tivozanib hcl (FOTIVDA ) 1.34 MG capsule, Take 1 capsule (1.34 mg total) by mouth daily. Take for 21 days on, then off for 7 days. Repeat every 28 days. (Patient not taking: Reported on 09/08/2024), Disp: 21 capsule, Rfl: 3  "

## 2024-11-01 ENCOUNTER — Other Ambulatory Visit: Payer: Self-pay

## 2024-11-01 ENCOUNTER — Other Ambulatory Visit (HOSPITAL_COMMUNITY): Payer: Self-pay

## 2024-11-01 ENCOUNTER — Telehealth: Payer: Self-pay

## 2024-11-01 ENCOUNTER — Ambulatory Visit: Admitting: Infectious Disease

## 2024-11-01 ENCOUNTER — Inpatient Hospital Stay

## 2024-11-01 DIAGNOSIS — J984 Other disorders of lung: Secondary | ICD-10-CM

## 2024-11-01 DIAGNOSIS — J188 Other pneumonia, unspecified organism: Secondary | ICD-10-CM

## 2024-11-01 DIAGNOSIS — C649 Malignant neoplasm of unspecified kidney, except renal pelvis: Secondary | ICD-10-CM

## 2024-11-01 NOTE — Telephone Encounter (Signed)
 Patient called and would like to know if he needs to keep today's appointment. States he's following with pulmonology and all of his testing came back negative. Secure chat sent to Dr. Fleeta Rothman to advise.   Liberato Stansbery, BSN, RN

## 2024-11-01 NOTE — Progress Notes (Signed)
 Specialty Pharmacy Refill Coordination Note  Michael [name] is a 58 y.o. male contacted today regarding refills of specialty medication(s) Tivozanib HCl (FOTIVDA )   Patient requested Delivery   Delivery date: 11/05/24   Verified address: [address]   [place] [address]   Medication will be filled on: 11/03/24

## 2024-11-01 NOTE — Telephone Encounter (Signed)
 Per Dr. Fleeta Rothman, patient does not need appointment. Called [name] and canceled.   Yazleen Molock, BSN, RN

## 2024-11-02 ENCOUNTER — Inpatient Hospital Stay

## 2024-11-02 ENCOUNTER — Ambulatory Visit (HOSPITAL_COMMUNITY)
Admission: RE | Admit: 2024-11-02 | Discharge: 2024-11-02 | Disposition: A | Discharge status: Home / Self Care | Source: Ambulatory Visit | Attending: Internal Medicine | Admitting: Internal Medicine

## 2024-11-02 DIAGNOSIS — C7931 Secondary malignant neoplasm of brain: Secondary | ICD-10-CM | POA: Insufficient documentation

## 2024-11-02 DIAGNOSIS — C641 Malignant neoplasm of right kidney, except renal pelvis: Secondary | ICD-10-CM | POA: Diagnosis not present

## 2024-11-02 DIAGNOSIS — C649 Malignant neoplasm of unspecified kidney, except renal pelvis: Secondary | ICD-10-CM | POA: Insufficient documentation

## 2024-11-02 DIAGNOSIS — E039 Hypothyroidism, unspecified: Secondary | ICD-10-CM

## 2024-11-02 LAB — CBC WITH DIFFERENTIAL (CANCER CENTER ONLY)
Abs Immature Granulocytes: 0.07 K/uL (ref 0.00–0.07)
Basophils Absolute: 0 K/uL (ref 0.0–0.1)
Basophils Relative: 0 %
Eosinophils Absolute: 0.3 K/uL (ref 0.0–0.5)
Eosinophils Relative: 3 %
HCT: 46.8 % (ref 39.0–52.0)
Hemoglobin: 16 g/dL (ref 13.0–17.0)
Immature Granulocytes: 1 %
Lymphocytes Relative: 20 %
Lymphs Abs: 1.7 K/uL (ref 0.7–4.0)
MCH: 28 pg (ref 26.0–34.0)
MCHC: 34.2 g/dL (ref 30.0–36.0)
MCV: 82 fL (ref 80.0–100.0)
Monocytes Absolute: 0.8 K/uL (ref 0.1–1.0)
Monocytes Relative: 10 %
Neutro Abs: 5.6 K/uL (ref 1.7–7.7)
Neutrophils Relative %: 66 %
Platelet Count: 244 K/uL (ref 150–400)
RBC: 5.71 MIL/uL (ref 4.22–5.81)
RDW: 17.5 % — ABNORMAL HIGH (ref 11.5–15.5)
WBC Count: 8.4 K/uL (ref 4.0–10.5)
nRBC: 0 % (ref 0.0–0.2)

## 2024-11-02 LAB — CMP (CANCER CENTER ONLY)
ALT: 61 U/L — ABNORMAL HIGH (ref 0–44)
AST: 37 U/L (ref 15–41)
Albumin: 4.3 g/dL (ref 3.5–5.0)
Alkaline Phosphatase: 77 U/L (ref 38–126)
Anion gap: 11 (ref 5–15)
BUN: 16 mg/dL (ref 6–20)
CO2: 28 mmol/L (ref 22–32)
Calcium: 9.4 mg/dL (ref 8.9–10.3)
Chloride: 102 mmol/L (ref 98–111)
Creatinine: 1.23 mg/dL (ref 0.61–1.24)
GFR, Estimated: >60 mL/min
Glucose, Bld: 107 mg/dL — ABNORMAL HIGH (ref 70–99)
Potassium: 3.8 mmol/L (ref 3.5–5.1)
Sodium: 141 mmol/L (ref 135–145)
Total Bilirubin: 0.5 mg/dL (ref 0.0–1.2)
Total Protein: 6.8 g/dL (ref 6.5–8.1)

## 2024-11-02 LAB — TSH: TSH: 9.65 u[IU]/mL — ABNORMAL HIGH (ref 0.350–4.500)

## 2024-11-02 LAB — T4, FREE: Free T4: 0.88 ng/dL (ref 0.80–2.00)

## 2024-11-03 ENCOUNTER — Other Ambulatory Visit: Payer: Self-pay

## 2024-11-03 ENCOUNTER — Ambulatory Visit
Admission: RE | Admit: 2024-11-03 | Discharge: 2024-11-03 | Disposition: A | Discharge status: Home / Self Care | Source: Ambulatory Visit | Attending: Urology | Admitting: Urology

## 2024-11-03 ENCOUNTER — Telehealth: Payer: Self-pay

## 2024-11-03 DIAGNOSIS — C7931 Secondary malignant neoplasm of brain: Secondary | ICD-10-CM

## 2024-11-03 NOTE — Telephone Encounter (Signed)
 Copied from CRM [id number]. Topic: Clinical - Medication Question >> Nov 03, 2024 10:34 AM Rilla B wrote: Reason for CRM: Patient would llike to talk to nurse regarding a medication he is currently taking. (States not a refill, but questions) Please call patient @ [phone]   ----------------------------------------------------------------------- From previous Reason for Contact - Medical Advice: Reason for CRM:  I called and spoke to pt. Pt stated Dr Theophilus is his pulmonologist and he was put on prednisone  for a while. Pt states he was supposed to decrease from 50 mg to taper off. Pt states he skipped the 40 mg and went right to 30 mg and pt wanted this to be noted. Pt states he will be on 20 mg when he see's Dr Theophilus next. Pt states he is feeling better and is more active. Pt states he is not having any issues.   Pt wanted Dr Theophilus to be aware and wanted to know if he should continue to taper off as directed or if Dr Theophilus had any suggestions before his upcoming appt.

## 2024-11-05 NOTE — Progress Notes (Signed)
 " Radiation Oncology         [phone] ________________________________  Name: Michael [name] MRN: [id number]  Date: 11/03/2024  DOB: 08-07-1966  Post Treatment Note  CC: [name], MD  [name], MD  Diagnosis:    59 y.o. man with a solitary brain metastasis and osseous metastases at T9 and in the manubrium, secondary to Stage IV clear cell renal cell carcinoma   Interval Since Last Radiation: 1 year s/p SRS Brain; 9 months s/p SBRT to the osseous metastases in the manubrium and T9 vertebral body   01/06/24 - 01/16/24: The osseous metastases in the manubrium and T9 vertebral body were treated to 50 Gy in 5 fractions of 10 Gy each.  10/10/23: The solitary 3 cm right cerebellar brain metastasis was treated to 18 Gy in a single fraction of pre-operative SRS, followed by surgical resection on 10/13/23  Narrative:  I spoke with the patient to conduct his routine scheduled 3 month follow up visit to review results of his post-treatment MRI brain scan from 10/26/24 via telephone to spare the patient unnecessary potential exposure in the healthcare setting during the current COVID-19 pandemic.  The patient was notified in advance and gave permission to proceed with this visit format.  He tolerated the SRS brain treatment well, without any acute side effects.  He also tolerated the SBRT treatments to the obvious lesions in the manubrium and T9 vertebral body.  He did notice some mild esophagitis towards the end of treatment and this has lingered but he feels like it is gradually improving.  Really only noticeable when he is eating large bites of foods or meats and breads.  No issues at all with liquids or soft foods.  He has noticed a significant reduction in size of the palpable lesion in the manubrium.  He has noted some concavity along the scalp incision that seems to be progressing but is not painful. Otherwise, he denies frequent or severe headaches, changes in his visual or auditory acuity, focal  weakness, nausea/vomiting or difficulties with balance.    His posttreatment MRI brain scan from 10/26/2024 remains without any concerning findings for new intracranial metastasis or residual disease.  There are stable postoperative changes in the right posterior fossa from resection of a cerebellar lesion. There has been progressive encephalomalacia in the right cerebellar hemisphere and mild dural enhancement which is unchanged. There is a small overlying pseudomeningocele but no new enhancing intracranial lesions, acute infarct, acute hemorrhage or midline shift.  We reviewed these findings by telephone today.  On review of systems, the patient states that he is doing very well in general and remains without complaints as noted above.  He has noted some concavity along the scalp incision that seems to be progressing but is not painful. He denies any unintentional weight loss, chest pain, increased shortness of breath, productive cough or hemoptysis.  He has not had any abdominal pain, nausea, vomiting or diarrhea.  He denies any focal weakness or pain in the skeleton. Overall, he is quite pleased with his progress to date.                          ALLERGIES:  is allergic to cleocin [clindamycin], penicillins, and tape.  Meds: Current Outpatient Medications  Medication Sig Dispense Refill   amLODipine  (NORVASC ) 5 MG tablet Take 7.5 mg by mouth daily before breakfast.     levothyroxine  (SYNTHROID ) 150 MCG tablet TAKE 1 TABLET(150 MCG) BY  MOUTH DAILY BEFORE BREAKFAST 30 tablet 2   montelukast  (SINGULAIR ) 10 MG tablet Take 10 mg by mouth daily before breakfast.     predniSONE  (DELTASONE ) 10 MG tablet Take 4 tablets (40 mg total) by mouth daily with breakfast. Starting in October 04, 2024 start taking 40 mg of prednisone  per day and then reduce dose by 10 mg every 2 weeks until taper is complete. 120 tablet 2   predniSONE  (DELTASONE ) 50 MG tablet Take 1 tablet (50 mg total) by mouth daily with  breakfast. 30 tablet 0   tadalafil (CIALIS) 20 MG tablet Take 10-20 mg by mouth daily as needed for erectile dysfunction.     tivozanib hcl (FOTIVDA ) 1.34 MG capsule Take 1 capsule (1.34 mg total) by mouth daily. Take for 21 days on, then off for 7 days. Repeat every 28 days. (Patient not taking: Reported on 09/08/2024) 21 capsule 3   No current facility-administered medications for this encounter.    Physical Findings:  vitals were not taken for this visit.  Pain Assessment Pain Score: 0-No pain/10 Unable to assess due to telephone follow-up visit format.  Lab Findings: Lab Results  Component Value Date   WBC 8.4 11/02/2024   HGB 16.0 11/02/2024   HCT 46.8 11/02/2024   MCV 82.0 11/02/2024   PLT 244 11/02/2024     Radiographic Findings: MR Brain W Wo Contrast Result Date: 10/26/2024 EXAM: MRI BRAIN WITH AND WITHOUT CONTRAST 10/26/2024 03:35:34 PM TECHNIQUE: Multiplanar multisequence MRI of the head/brain was performed with and without the administration of intravenous contrast. 10 mL (gadobutrol  (GADAVIST ) 1 MMOL/ML injection 10 mL GADOBUTROL  1 MMOL/ML IV SOLN) was administered. COMPARISON: MRI head 07/16/2024. CLINICAL HISTORY: Metastatic disease evaluation; 3T SRS Protocol. History of metastatic renal cell carcinoma status post resection of a right cerebellar metastasis. FINDINGS: BRAIN AND VENTRICLES: Postoperative changes are again seen from right occipital craniotomy and right cerebellar tumor resection. There has been development of progressive encephalomalacia in the right cerebellar hemisphere. Mild dural enhancement is again noted subjacent to the craniotomy with decreased, minimal amount of nodular enhancement at the posterior aspect of the resection cavity. No progressive or new masslike enhancement is seen to indicate locally recurrent tumor, and no new enhancing brain lesions are seen elsewhere. A small amount of chronic blood products are again seen at the resection site, and  there is a small overlying pseudomeningocele which has mildly decreased in size. No acute infarct, midline shift, or hydrocephalus is evident. Several scattered punctate foci of nonspecific T2 hyperintensity in the cerebral white matter are unchanged. Major intracranial vascular flow voids are preserved. ORBITS: No acute abnormality. SINUSES: Small right mastoid effusion. Minimal mucosal thickening in the paranasal sinuses. BONES AND SOFT TISSUES: Normal bone marrow signal and enhancement. No acute soft tissue abnormality. IMPRESSION: 1. Post-treatment changes in the cerebellum without evidence of locally recurrent or new intracranial metastatic disease. Electronically signed by: Dasie Hamburg MD 10/26/2024 05:13 PM EST RP Workstation: [id number]    Impression/Plan: 1.  59 y.o. man with a solitary brain metastasis and osseous metastases at T9 and in the manubrium, secondary to Stage IV clear cell renal cell carcinoma. He appears to have recovered well from the effects of the preop SRS brain treatment and SBRT of the osseous metastases and is currently without complaints.  We reviewed the findings from his recent posttreatment MRI brain scan, performed on  10/26/2024, which is without any concerning findings for new intracranial metastasis or residual disease.  Therefore, we discussed the plan to  continue with serial MRI brain scans every 3 months to continue to monitor for any evidence of disease progression or recurrence.  I will plan to connect with him by telephone following each scan to review results and recommendations.  He will also continue in routine follow-up under the care and direction of Dr. Gatha for continued management of his systemic disease.  He knows that he is welcome to call at anytime in the interim with any questions or concerns related to his previous radiation treatments.  He appears to have a good understanding of these recommendations and is comfortable and in agreement with the  stated plan.  I personally spent 30 minutes in this encounter including chart review, reviewing radiological studies, telephone conversation with the patient, entering orders, coordinating care and completing documentation.    Sabra MICAEL Rusk, PA-C  "

## 2024-11-08 NOTE — Telephone Encounter (Signed)
 ATC x1.  LVM to return call.  Cancelled appointment with Dr. Geronimo on 11/09/24 per Dr. Theophilus.  Rescheduled with Dr. Theophilus on 11/09/24 at 9:45 am.  Sent patient a mychart message to make him aware.  Advised him to call if this time does not work for him.  Mychart message sent with information per Dr. Theophilus.

## 2024-11-08 NOTE — Telephone Encounter (Signed)
 Please instruct to continue taper as directed.  Looks like he has an appointment with Dr. Geronimo tomorrow. Not sure why that was scheduled instead of me as he has not seen him before. Can you please reschedule his appointment to me this week.   Ok to use a same day or follow up held spot.

## 2024-11-09 ENCOUNTER — Ambulatory Visit: Admitting: Pulmonary Disease

## 2024-11-09 ENCOUNTER — Ambulatory Visit: Admitting: Internal Medicine

## 2024-11-09 ENCOUNTER — Encounter: Payer: Self-pay | Admitting: Pulmonary Disease

## 2024-11-09 VITALS — BP 154/100 | HR 83 | Temp 97.8°F | Ht 66.0 in | Wt 259.0 lb

## 2024-11-09 DIAGNOSIS — J8410 Pulmonary fibrosis, unspecified: Secondary | ICD-10-CM

## 2024-11-09 DIAGNOSIS — C649 Malignant neoplasm of unspecified kidney, except renal pelvis: Secondary | ICD-10-CM | POA: Diagnosis not present

## 2024-11-09 DIAGNOSIS — J984 Other disorders of lung: Secondary | ICD-10-CM

## 2024-11-09 DIAGNOSIS — J849 Interstitial pulmonary disease, unspecified: Secondary | ICD-10-CM

## 2024-11-09 NOTE — Progress Notes (Signed)
 "              Michael [name]    [id number]    Aug 25, 1966  Primary Care Physician:Russo, Norleen, MD  Referring Physician: Onita Norleen, MD [address] [place],  [place] [address]  Chief complaint: Follow-up for immunotherapy related pneumonitis  HPI: 59 y.o. 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  Discussed the use of AI scribe software for clinical note transcription with the patient, who gave verbal consent to proceed.  History of Present Illness Michael [name] is a 59 year old male with renal cell cancer on immunotherapy with nivolumab  who presents for follow-up of respiratory issues.  Respiratory symptoms - Hospitalized for recurrent respiratory issues and respiratory failure, discharged October 30th - Bronchoscopy on October 29th showed negative microbiology.  Cytology and path shows changes of pneumonitis - Started prednisone  50 mg daily post-discharge - Significant improvement in energy levels and reduction in dyspnea - Respiratory symptoms are improving  Glucocorticoid therapy effects - On prednisone  for less than one week, previously on Medrol  during hospitalization - Experiencing increased hunger - No issues with blood sugar or diabetes  Prophylactic antibiotic use - Started Bactrim  three times a week post-discharge  Oncologic history and treatment - Stage IV renal cell carcinoma - Status post right nephrectomy - History of brain tumor resected in December - Chest and spine tumors treated with stereotactic radiation - Currently receiving nivolumab  which has been discontinued and scheduled to start tivozanib for ongoing cancer management by Dr. Sherrod - Cabometyx  discontinued due to liver toxicity  Interim history: Michael [name] is a 59 year old male with pneumonitis secondary to immunotherapy who presents for follow-up of  his condition.  Respiratory symptoms and functional status - No limiting respiratory symptoms - Able to walk, work out, and play tennis two to three times per week without difficulty - Remains physically active, including tennis and treadmill exercise while traveling  Glucocorticoid therapy and adverse effects - Completed prednisone  50 mg daily for one month, tapered to 30 mg, currently on 20 mg daily for the past two weeks - Experienced 10-pound weight gain and moon facies as side effects  Imaging and laboratory findings - Chest scan performed on December 30, results pending - ALT mildly elevated at 60 (baseline 30) on recent laboratory evaluation   Outpatient Encounter Medications as of 11/09/2024  Medication Sig   amLODipine  (NORVASC ) 5 MG tablet Take 7.5 mg by mouth daily before breakfast.   levothyroxine  (SYNTHROID ) 150 MCG tablet TAKE 1 TABLET(150 MCG) BY MOUTH DAILY BEFORE BREAKFAST   montelukast  (SINGULAIR ) 10 MG tablet Take 10 mg by mouth daily before breakfast.   predniSONE  (DELTASONE ) 10 MG tablet Take 4 tablets (40 mg total) by mouth daily with breakfast. Starting in October 04, 2024 start taking 40 mg of prednisone  per day and then reduce dose by 10 mg every 2 weeks until taper is complete.   tadalafil (CIALIS) 20 MG tablet Take 10-20 mg by mouth daily as needed for erectile dysfunction.   tivozanib hcl (FOTIVDA ) 1.34 MG capsule Take 1 capsule (1.34 mg total) by mouth daily. Take for 21 days on, then off for 7 days. Repeat every 28 days.   predniSONE  (DELTASONE ) 50 MG tablet Take 1 tablet (50 mg total) by mouth daily with breakfast. (Patient not taking: Reported on 11/09/2024)   No facility-administered encounter medications on file as of 11/09/2024.  Physical Exam: Today's Vitals   11/09/24 9047  TempSrc: Oral  Weight: 259 lb (117.5 kg)  Height: 5' 6 (1.676 m)   Body mass index is 41.8 kg/m.  Physical Exam GEN: No acute distress CV: Regular rate and rhythm, no  murmurs LUNGS: Clear to auscultation bilaterally, normal respiratory effort SKIN JOINTS: Warm and dry, no rash   Data Reviewed: Imaging: CTA 08/27/2024-metastatic bony disease stable from before.  Waxing and waning infiltrates.  Mostly in the left upper lobe.  Rounded nodular densities  CT chest abdomen pelvis 11/02/2024-radiology read is pending.  By my review lungs show marked improvement in bilateral infiltrate. I reviewed the images personally.  PFTs:  Labs: Bronchoscopy 09/01/2024 Microbiology negative Cytology, pathology-changes consistent with pneumonitis Cell count-37 to 41% neutrophils  CTD serologies 09/02/2024-positive only for aldolase of 15.3  Assessment & Plan Drug-induced pneumonitis with residual pulmonary fibrosis Recurrent respiratory issues and respiratory failure likely due to drug-induced pneumonitis from immunotherapy with nivolumab . Bronchoscopy and biopsy indicate nonspecific inflammation, likely from immunotherapy rather than radiation pneumonitis. Symptoms have improved with prednisone  and Bactrim .  CTD serologies are negative except for mildly elevated aldolase which is likely nonspecific  Currently improving with treatment. Recent chest scan by my review shows significant improvement with residual scarring, likely permanent. ALT levels have stabilized at 60, previously elevated from baseline of 30s. He has been tapering prednisone  successfully, currently at 20 mg, with plans to further taper to 10 mg and then 5 mg before discontinuation. No current use of Bactrim , as prednisone  dose has decreased. - Continue tapering prednisone : reduce to 10 mg for one week, then 5 mg for one week, then discontinue. - Monitor for any flare-ups or changes in symptoms during prednisone  taper. - Scheduled follow-up in six months to assess condition and need for further visits.  Renal cell carcinoma, status post immunotherapy, transitioning to Tivozanib Renal cell carcinoma  previously treated with nivolumab , now transitioned to Tivozanib due to respiratory issues.  - Continue with Tivozanib as prescribed by oncology.  Recommendations: Continue prednisone  taper to off for the next 2 weeks Monitor symptoms for recurrence  Lonna Coder MD [facility] 11/09/2024, 9:56 AM  CC: [name], MD    "

## 2024-11-09 NOTE — Patient Instructions (Signed)
" °  VISIT SUMMARY: Today, you had a follow-up appointment to check on your pneumonitis condition caused by immunotherapy. You are doing well with no limiting respiratory symptoms and remain physically active. Your prednisone  dose is being tapered down, and your recent chest scan shows significant improvement.  YOUR PLAN: DRUG-INDUCED PNEUMONITIS WITH RESIDUAL PULMONARY FIBROSIS: Your pneumonitis caused by immunotherapy is improving with treatment, but there is some residual scarring in your lungs. -Continue tapering prednisone : reduce to 10 mg for one week, then 5 mg for one week, then discontinue. -Monitor for any flare-ups or changes in symptoms during prednisone  taper. -Scheduled follow-up in six months to assess your condition and determine if further visits are needed. "

## 2024-11-10 ENCOUNTER — Other Ambulatory Visit: Payer: Self-pay | Admitting: Radiation Therapy

## 2024-11-10 ENCOUNTER — Inpatient Hospital Stay: Attending: Oncology | Admitting: Internal Medicine

## 2024-11-10 VITALS — BP 134/101 | HR 79 | Temp 97.6°F | Resp 17 | Ht 66.0 in | Wt 257.9 lb

## 2024-11-10 DIAGNOSIS — T451X5A Adverse effect of antineoplastic and immunosuppressive drugs, initial encounter: Secondary | ICD-10-CM | POA: Insufficient documentation

## 2024-11-10 DIAGNOSIS — Z79890 Hormone replacement therapy (postmenopausal): Secondary | ICD-10-CM | POA: Insufficient documentation

## 2024-11-10 DIAGNOSIS — K754 Autoimmune hepatitis: Secondary | ICD-10-CM | POA: Insufficient documentation

## 2024-11-10 DIAGNOSIS — C7931 Secondary malignant neoplasm of brain: Secondary | ICD-10-CM | POA: Diagnosis not present

## 2024-11-10 DIAGNOSIS — Z880 Allergy status to penicillin: Secondary | ICD-10-CM | POA: Diagnosis not present

## 2024-11-10 DIAGNOSIS — J704 Drug-induced interstitial lung disorders, unspecified: Secondary | ICD-10-CM | POA: Diagnosis not present

## 2024-11-10 DIAGNOSIS — Z905 Acquired absence of kidney: Secondary | ICD-10-CM | POA: Diagnosis not present

## 2024-11-10 DIAGNOSIS — E039 Hypothyroidism, unspecified: Secondary | ICD-10-CM | POA: Diagnosis not present

## 2024-11-10 DIAGNOSIS — Z923 Personal history of irradiation: Secondary | ICD-10-CM | POA: Insufficient documentation

## 2024-11-10 DIAGNOSIS — L299 Pruritus, unspecified: Secondary | ICD-10-CM | POA: Diagnosis not present

## 2024-11-10 DIAGNOSIS — C641 Malignant neoplasm of right kidney, except renal pelvis: Secondary | ICD-10-CM | POA: Insufficient documentation

## 2024-11-10 DIAGNOSIS — Z881 Allergy status to other antibiotic agents status: Secondary | ICD-10-CM | POA: Insufficient documentation

## 2024-11-10 DIAGNOSIS — Z79899 Other long term (current) drug therapy: Secondary | ICD-10-CM | POA: Insufficient documentation

## 2024-11-10 DIAGNOSIS — Z8701 Personal history of pneumonia (recurrent): Secondary | ICD-10-CM | POA: Diagnosis not present

## 2024-11-10 DIAGNOSIS — C7800 Secondary malignant neoplasm of unspecified lung: Secondary | ICD-10-CM | POA: Insufficient documentation

## 2024-11-10 DIAGNOSIS — K7689 Other specified diseases of liver: Secondary | ICD-10-CM | POA: Insufficient documentation

## 2024-11-10 DIAGNOSIS — R21 Rash and other nonspecific skin eruption: Secondary | ICD-10-CM | POA: Diagnosis not present

## 2024-11-10 DIAGNOSIS — N289 Disorder of kidney and ureter, unspecified: Secondary | ICD-10-CM | POA: Insufficient documentation

## 2024-11-10 DIAGNOSIS — Z9226 Personal history of immune checkpoint inhibitor therapy: Secondary | ICD-10-CM | POA: Diagnosis not present

## 2024-11-10 DIAGNOSIS — I10 Essential (primary) hypertension: Secondary | ICD-10-CM | POA: Diagnosis not present

## 2024-11-10 NOTE — Progress Notes (Signed)
 "     [facility] [phone]   [phone]  OFFICE PROGRESS NOTE  [name], MD [address] [place] [address]  DIAGNOSIS: Stage IV clear-cell renal cell carcinoma initially diagnosed as locally advanced disease in 2020 with evidence of pulmonary metastasis in 2021.  PRIOR THERAPY: 1) status post right nephrectomy on August 27, 2019 in [place]  and the final pathology showed clear-cell renal cell carcinoma nuclear grade 3 with 10% necrosis and negative margin with the final pathologic stage of T2a, NX. 2) status post treatment with Keytruda  and axitinib  started March 2021 for 2 cycles interrupted because of autoimmune hepatitis.  3) axitinib  5 mg daily started July 05, 2020 discontinued after 1 week because of elevated LFTs. 4) status post cryoablation of left renal mass on May 30, 2021. 5) Keytruda  200 Mg IV every 3 weeks started July 27, 2021 and then switch it to 400 Mg every 6 weeks on November 09, 2021.  Last dose was given July 2024. 6) the patient had solitary brain metastasis in November 2024 and currently undergoing treatment with SRS and this will be followed by craniotomy and surgical resection on October 13, 2023. 7) Nivolumab   480 mg IV every 4 weeks in addition to Cabometyx  40 mg p.o daily.  Cabometyx  was discontinued secondary to elevated liver enzyme and intolerance.  He is status post 7 cycles of treatment with nivolumab .  Last dose was given on August 16, 2024.  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  CURRENT THERAPY: Tivozanib (Fotivda ) 1.34 mg p.o. daily for 21 days every 4 weeks.  This treatment started on September 11, 2024.  INTERVAL HISTORY: Michael [name] 59 y.o. male returns to the clinic today for follow-up visit accompanied by his wife.Discussed the use of AI scribe software for clinical note transcription with the patient, who gave verbal consent to proceed.  History of Present  Illness Michael [name] is a 59 year old male with stage IV clear cell renal cell carcinoma with pulmonary metastases who presents for evaluation and repeat CT imaging for restaging.  He was diagnosed with locally advanced clear cell renal cell carcinoma in 2020, with pulmonary metastases identified in 2021. He has undergone left nephrectomy and received multiple prior lines of therapy, including pembrolizumab  and axitinib , followed by nivolumab  and cabozantinib , which were discontinued due to disease progression. He initiated tivozanib 1.34 mg PO daily (three weeks on, one week off) on September 11, 2024.  Recent pulmonary imaging showed improvement compared to October 2025, with a few small residual spots noted. He has no new respiratory symptoms and is nearing completion of a prednisone  taper, with approximately ten days remaining.  He denies significant adverse effects from tivozanib, except for hoarseness, which may be related to prolonged steroid use. He reports sensitive areas and blistering on the soles of his feet, particularly toward the third week of the tivozanib cycle. He has recently resumed playing tennis, wears cushioned shoes, and avoids prolonged standing at home.  He reviewed his recent CT scan and inquired about a stable, likely benign lesion of the left kidney measuring 1.4 cm, unchanged over serial imaging. He referenced a history of two kidney masses, one treated with cryoablation and the other which decreased in size with treatment. He is aware that his imaging report mentioned several small pulmonary nodules up to 4 mm and hepatic cysts.  He does not use alcohol  significantly and expresses satisfaction with his current disease status and treatment plan.  MEDICAL HISTORY: Past Medical History:  Diagnosis Date   Cancer [facility])    Renal cell carcinoma   Clear cell renal cell carcinoma (HCC)    Drug-induced pneumonitis 10/31/2024   Hypertension    Hypothyroidism     Sleep apnea    wears cpap    ALLERGIES:  is allergic to cleocin [clindamycin], penicillins, and tape.  MEDICATIONS:  Current Outpatient Medications  Medication Sig Dispense Refill   amLODipine  (NORVASC ) 5 MG tablet Take 7.5 mg by mouth daily before breakfast.     levothyroxine  (SYNTHROID ) 150 MCG tablet TAKE 1 TABLET(150 MCG) BY MOUTH DAILY BEFORE BREAKFAST 30 tablet 2   montelukast  (SINGULAIR ) 10 MG tablet Take 10 mg by mouth daily before breakfast.     predniSONE  (DELTASONE ) 10 MG tablet Take 4 tablets (40 mg total) by mouth daily with breakfast. Starting in October 04, 2024 start taking 40 mg of prednisone  per day and then reduce dose by 10 mg every 2 weeks until taper is complete. 120 tablet 2   predniSONE  (DELTASONE ) 50 MG tablet Take 1 tablet (50 mg total) by mouth daily with breakfast. (Patient not taking: Reported on 11/09/2024) 30 tablet 0   tadalafil (CIALIS) 20 MG tablet Take 10-20 mg by mouth daily as needed for erectile dysfunction.     tivozanib hcl (FOTIVDA ) 1.34 MG capsule Take 1 capsule (1.34 mg total) by mouth daily. Take for 21 days on, then off for 7 days. Repeat every 28 days. 21 capsule 3   No current facility-administered medications for this visit.    SURGICAL HISTORY:  Past Surgical History:  Procedure Laterality Date   APPLICATION OF CRANIAL NAVIGATION N/A 10/13/2023   Procedure: APPLICATION OF CRANIAL NAVIGATION;  Surgeon: Lanis Pupa, MD;  Location: [facility] OR;  Service: Neurosurgery;  Laterality: N/A;   CRANIOTOMY N/A 10/13/2023   Procedure: SUBOCCIPITAL   CRANIOTOMY TUMOR EXCISION;  Surgeon: Lanis Pupa, MD;  Location: [facility] OR;  Service: Neurosurgery;  Laterality: N/A;   IR RADIOLOGIST EVAL & MGMT  04/04/2021   no procedures performed   IR RADIOLOGIST EVAL & MGMT  05/08/2021   no procedures performed   IR RADIOLOGIST EVAL & MGMT  08/12/2022   IR RADIOLOGIST EVAL & MGMT  05/24/2024   LUNG BIOPSY  2021   RADIOLOGY WITH ANESTHESIA N/A 05/30/2021    Procedure: CT WITH ANESTHESIA CRYOABLATION;  Surgeon: Luverne Aran, MD;  Location: [facility] ORS;  Service: Radiology;  Laterality: N/A;   right nephrectomy Right 08/2019   TONSILLECTOMY     VIDEO BRONCHOSCOPY Bilateral 09/01/2024   Procedure: BRONCHOSCOPY, WITH FLUOROSCOPY;  Surgeon: Mannam, Praveen, MD;  Location: [facility] ENDOSCOPY;  Service: Cardiopulmonary;  Laterality: Bilateral;    REVIEW OF SYSTEMS:  Constitutional: negative Eyes: negative Ears, nose, mouth, throat, and face: negative Respiratory: negative Cardiovascular: negative Gastrointestinal: negative Genitourinary:negative Integument/breast: negative Hematologic/lymphatic: negative Musculoskeletal:negative Neurological: negative Behavioral/Psych: negative Endocrine: negative Allergic/Immunologic: negative   PHYSICAL EXAMINATION: General appearance: alert, cooperative, appears stated age, and no distress Head: Normocephalic, without obvious abnormality, atraumatic Neck: no adenopathy, no JVD, supple, symmetrical, trachea midline, and thyroid  not enlarged, symmetric, no tenderness/mass/nodules Lymph nodes: Cervical, supraclavicular, and axillary nodes normal. Resp: clear to auscultation bilaterally Back: symmetric, no curvature. ROM normal. No CVA tenderness. Cardio: regular rate and rhythm, S1, S2 normal, no murmur, click, rub or gallop GI: soft, non-tender; bowel sounds normal; no masses,  no organomegaly Extremities: extremities normal, atraumatic, no cyanosis or edema Neurologic: Alert and oriented X 3, normal strength and tone. Normal symmetric reflexes. Normal  coordination and gait  ECOG PERFORMANCE STATUS: 1 - Symptomatic but completely ambulatory  Blood pressure (!) 134/101, pulse 79, temperature 97.6 F (36.4 C), temperature source Temporal, resp. rate 17, height 5' 6 (1.676 m), weight 257 lb 14.4 oz (117 kg), SpO2 96%.  LABORATORY DATA: Lab Results  Component Value Date   WBC 8.4 11/02/2024   HGB 16.0  11/02/2024   HCT 46.8 11/02/2024   MCV 82.0 11/02/2024   PLT 244 11/02/2024      Chemistry      Component Value Date/Time   NA 141 11/02/2024 0743   K 3.8 11/02/2024 0743   CL 102 11/02/2024 0743   CO2 28 11/02/2024 0743   BUN 16 11/02/2024 0743   CREATININE 1.23 11/02/2024 0743      Component Value Date/Time   CALCIUM 9.4 11/02/2024 0743   ALKPHOS 77 11/02/2024 0743   AST 37 11/02/2024 0743   ALT 61 (H) 11/02/2024 0743   BILITOT 0.5 11/02/2024 0743       RADIOGRAPHIC STUDIES: CT CHEST ABDOMEN PELVIS WO CONTRAST Result Date: 11/09/2024 EXAM: CT CHEST, ABDOMEN AND PELVIS WITHOUT CONTRAST 11/02/2024 08:23:48 AM TECHNIQUE: CT of the chest, abdomen and pelvis was performed without the administration of intravenous contrast. Multiplanar reformatted images are provided for review. Automated exposure control, iterative reconstruction, and/or weight based adjustment of the mA/kV was utilized to reduce the radiation dose to as low as reasonably achievable. COMPARISON: 08/27/2024 and 08/05/2024. CLINICAL HISTORY: Renal cell carcinoma status post right nephrectomy, with prior pulmonary metastatic disease and prior osseous intracranial metastatic disease. * Tracking Code: BO * FINDINGS: CHEST: MEDIASTINUM AND LYMPH NODES: Heart and pericardium are unremarkable. The central airways are clear. No mediastinal, hilar or axillary lymphadenopathy. Large lytic metastatic lesion of the right sternal manubrium again observed with surrounding inflammatory or therapy related findings along the medial right pectoralis muscle and adjacent subcutaneous tissues. LUNGS AND PLEURA: Substantial reduction in prior biapical and paramediastinal airspace opacities, currently manifesting as volume loss and bandlike paramediastinal densities in the upper lobes. Previous lower lobe and inferior right middle lobe airspace opacities have primarily cleared with only some faint interstitial accentuation and potentially hazy  alveolitis medially in the right lower lobe and laterally in the left lower lobe. Several small nodules measuring up to 4 mm in diameter are present in the right lower lobe, previously obscured by airspace opacities. No pleural effusion. No pneumothorax. ABDOMEN AND PELVIS: LIVER: Stable hepatic cysts. GALLBLADDER AND BILE DUCTS: Unremarkable. No biliary ductal dilatation. SPLEEN: No acute abnormality. PANCREAS: No acute abnormality. ADRENAL GLANDS: No acute abnormality. KIDNEYS, URETERS AND BLADDER: Right nephrectomy without local recurrence. Likely benign lesion of the left mid kidney measuring about 1.4 cm in diameter on image 68 series 2, no change from 08/05/2024. No stones in the kidneys or ureters. No hydronephrosis. No perinephric or periureteral stranding. Urinary bladder is unremarkable. GI AND BOWEL: Stomach demonstrates no acute abnormality. There is no bowel obstruction. REPRODUCTIVE ORGANS: No acute abnormality. PERITONEUM AND RETROPERITONEUM: No ascites. No free air. VASCULATURE: Aorta is normal in caliber. ABDOMINAL AND PELVIS LYMPH NODES: No lymphadenopathy. BONES AND SOFT TISSUES: Metastatic lesion along the anterior T9 vertebral body with mild extraosseous extension, measuring about 3.6 x 1.8 cm on image 41 series 2, stable. Suspected left lateral recess disc protrusion at the L4-L5 level. No acute osseous abnormality. No focal soft tissue abnormality. IMPRESSION: 1. Stable lytic metastatic lesion of the right sternal manubrium with surrounding inflammatory or treatment-related changes along the medial right pectoralis  muscle and adjacent subcutaneous tissues. Stable anterior T9 vertebral body metastatic lesion. 2. Substantial reduction of prior biapical and paramediastinal airspace opacities with resultant volume loss and bandlike paramediastinal densities in the upper lobes. 3. Previous lower lobe and inferior right middle lobe airspace opacities largely cleared with mild residual interstitial  prominence and possible focal alveolitis medially in the right lower lobe and laterally in the left lower lobe. 4. Several small pulmonary nodules up to 4 mm in the right lower lobe. 5. Right nephrectomy without local recurrence. 6. Likely benign left mid renal lesion measuring approximately 1.4 cm, stable. 7. Stable hepatic cysts. 8. Suspected left lateral recess disc protrusion at L4-L5. Electronically signed by: Ryan Salvage MD 11/09/2024 05:17 PM EST RP Workstation: [id number]   MR Brain W Wo Contrast Result Date: 10/26/2024 EXAM: MRI BRAIN WITH AND WITHOUT CONTRAST 10/26/2024 03:35:34 PM TECHNIQUE: Multiplanar multisequence MRI of the head/brain was performed with and without the administration of intravenous contrast. 10 mL (gadobutrol  (GADAVIST ) 1 MMOL/ML injection 10 mL GADOBUTROL  1 MMOL/ML IV SOLN) was administered. COMPARISON: MRI head 07/16/2024. CLINICAL HISTORY: Metastatic disease evaluation; 3T SRS Protocol. History of metastatic renal cell carcinoma status post resection of a right cerebellar metastasis. FINDINGS: BRAIN AND VENTRICLES: Postoperative changes are again seen from right occipital craniotomy and right cerebellar tumor resection. There has been development of progressive encephalomalacia in the right cerebellar hemisphere. Mild dural enhancement is again noted subjacent to the craniotomy with decreased, minimal amount of nodular enhancement at the posterior aspect of the resection cavity. No progressive or new masslike enhancement is seen to indicate locally recurrent tumor, and no new enhancing brain lesions are seen elsewhere. A small amount of chronic blood products are again seen at the resection site, and there is a small overlying pseudomeningocele which has mildly decreased in size. No acute infarct, midline shift, or hydrocephalus is evident. Several scattered punctate foci of nonspecific T2 hyperintensity in the cerebral white matter are unchanged. Major intracranial  vascular flow voids are preserved. ORBITS: No acute abnormality. SINUSES: Small right mastoid effusion. Minimal mucosal thickening in the paranasal sinuses. BONES AND SOFT TISSUES: Normal bone marrow signal and enhancement. No acute soft tissue abnormality. IMPRESSION: 1. Post-treatment changes in the cerebellum without evidence of locally recurrent or new intracranial metastatic disease. Electronically signed by: Dasie Hamburg MD 10/26/2024 05:13 PM EST RP Workstation: [id number]     ASSESSMENT AND PLAN: This is a very pleasant 59 years old white male with Stage IV clear-cell renal cell carcinoma initially diagnosed as locally advanced disease in 2020 with evidence of pulmonary metastasis in 2021.  He is status post right nephrectomy on August 27, 2019 in [place]  and the final pathology showed clear-cell renal cell carcinoma nuclear grade 3 with 10% necrosis and negative margin with the final pathologic stage of T2a, NX. The patient is status post treatment with Keytruda  and axitinib  started March 2021 for 2 cycles interrupted because of autoimmune hepatitis. He then  started Axitinib  5 mg daily on July 05, 2020 discontinued after 1 week because of elevated LFTs. He is also status post cryoablation of left renal mass on May 30, 2021. The patient is currently undergoing treatment with Keytruda  200 Mg IV every 3 weeks started July 27, 2021 and then switch it to 400 Mg every 6 weeks on November 09, 2021.  He completed his treatment in July 2024. He was found to have solitary right cerebellar brain metastasis in November 2024. He started treatment with a combination of nivolumab  480 mg  IV every 4 weeks and Cabometyx  40 mg p.o. daily status post 7 cycles.  Cabometyx  was discontinued secondary to intolerance.  Last dose of immunotherapy with nivolumab  was [place] on August 16, 2024 discontinued secondary to suspicious immunotherapy mediated pneumonitis that has been recurrent recently. He is currently on  treatment with Tivozanib (Fotivda ) 1.34 mg p.o. daily for 21 days every 4 weeks status post 2 months of treatment.  He is tolerating his treatment fairly well. He had repeat CT scan of the chest, abdomen and pelvis performed recently.  I personally independently reviewed the scan images and discussed the result and showed the images to the patient and his wife. His scan showed no concerning findings for disease progression and there was resolution of the airspace disease in his lungs. Assessment and Plan Assessment & Plan Stage IV clear cell renal cell carcinoma with pulmonary metastases Disease remains stable on tivozanib, with no new lesions and improvement in pulmonary nodules on recent CT. He has tolerated multiple prior lines of therapy, discontinued due to progression. Laboratory values are stable. He is nearing completion of a prednisone  taper.  - Continued tivozanib 1.34 mg PO daily for three weeks every four weeks. - Reviewed recent CT chest, abdomen, and pelvis confirming stable disease. - Reviewed laboratory results; will reassess as he completes steroid taper. - Scheduled follow-up visit and laboratory evaluation in one month.  Adverse effects of antineoplastic therapy He reports mild adverse effects from tivozanib, including hoarseness (likely related to prolonged steroid use) and plantar blistering, possibly due to medication or increased activity. Prednisone  taper is nearly complete. - Advised use of cushioned footwear and avoidance of prolonged standing for plantar symptoms. - Continued prednisone  taper, with completion anticipated in approximately 10 days.  Benign lesion of left kidney A 1.4 cm stable, likely benign lesion of the left kidney on imaging, unchanged from prior studies, without features concerning for malignancy. - Reviewed imaging confirming stability of the left renal lesion. The patient was advised to call immediately if he has any concerning symptoms in the  interval.  The patient voices understanding of current disease status and treatment options and is in agreement with the current care plan.  All questions were answered. The patient knows to call the clinic with any problems, questions or concerns. We can certainly see the patient much sooner if necessary.  The total time spent in the appointment was 30 minutes including review of chart and various tests results, discussions about plan of care and coordination of care plan .   Disclaimer: This note was dictated with voice recognition software. Similar sounding words can inadvertently be transcribed and may not be corrected upon review.        "

## 2024-11-11 ENCOUNTER — Other Ambulatory Visit: Payer: Self-pay | Admitting: Interventional Radiology

## 2024-11-11 DIAGNOSIS — C642 Malignant neoplasm of left kidney, except renal pelvis: Secondary | ICD-10-CM

## 2024-11-12 ENCOUNTER — Other Ambulatory Visit: Payer: Self-pay | Admitting: Radiation Therapy

## 2024-11-15 ENCOUNTER — Other Ambulatory Visit: Payer: Self-pay | Admitting: Urology

## 2024-11-15 ENCOUNTER — Other Ambulatory Visit (HOSPITAL_COMMUNITY): Payer: Self-pay

## 2024-11-15 ENCOUNTER — Other Ambulatory Visit: Payer: Self-pay

## 2024-11-15 MED ORDER — LORAZEPAM 1 MG PO TABS
1.0000 mg | ORAL_TABLET | ORAL | 0 refills | Status: AC
  Filled 2024-11-15: qty 30, 30d supply, fill #0

## 2024-11-19 NOTE — Telephone Encounter (Signed)
 Patient was seen 11/09/2024. NFN

## 2024-11-23 ENCOUNTER — Other Ambulatory Visit: Payer: Self-pay

## 2024-11-24 ENCOUNTER — Telehealth: Payer: Self-pay | Admitting: Medical Oncology

## 2024-11-24 ENCOUNTER — Other Ambulatory Visit: Payer: Self-pay

## 2024-11-24 NOTE — Telephone Encounter (Addendum)
 Patient reports onset of rash, fatigue, and chills beginning last week. Rash noted to bilateral lower legs, bilateral forearms, and chest. Patient describes rash as very pruritic, especially in the mornings. Reports extreme fatigue; currently out of town attending a conference and training classes and has required daily naps lasting approximately 2 hours. Patient reports decreased appetite and states he is unable to get warm.  Patient reports experiencing similar symptoms while on a previous medication. Recently completed a Medrol  Dose Pak prescribed by pulmonology for possible pneumonitis.  Denies nausea, vomiting, diarrhea, or constipation. Rash is beginning to scab over secondary to frequent scratching. Pt stopped taking Tivozanib on 12/02/24.  He flies home on Friday . Should he get labs out of town and make appt next week.  Please advise

## 2024-11-25 ENCOUNTER — Other Ambulatory Visit: Payer: Self-pay | Admitting: Medical Oncology

## 2024-11-25 ENCOUNTER — Telehealth: Payer: Self-pay | Admitting: Medical Oncology

## 2024-11-25 DIAGNOSIS — C649 Malignant neoplasm of unspecified kidney, except renal pelvis: Secondary | ICD-10-CM

## 2024-11-25 DIAGNOSIS — R21 Rash and other nonspecific skin eruption: Secondary | ICD-10-CM

## 2024-11-25 DIAGNOSIS — Z5112 Encounter for antineoplastic immunotherapy: Secondary | ICD-10-CM

## 2024-11-25 MED ORDER — METHYLPREDNISOLONE 4 MG PO TBPK: ORAL_TABLET | ORAL | 0 refills | Status: AC

## 2024-11-25 NOTE — Telephone Encounter (Signed)
 Schedule message sent for appts for next week

## 2024-11-25 NOTE — Telephone Encounter (Signed)
 Pt notified that Dr Sherrod sent in a rx for a medrol  dose pak for his rash to local pharmacy. Also pt told that he can expect a call from a scheduler  for his appts next week-Lab and [facility] . Lab orders entered.

## 2024-11-26 ENCOUNTER — Telehealth: Payer: Self-pay

## 2024-11-26 ENCOUNTER — Other Ambulatory Visit: Payer: Self-pay

## 2024-11-26 ENCOUNTER — Encounter: Payer: Self-pay | Admitting: Physician Assistant

## 2024-11-26 ENCOUNTER — Other Ambulatory Visit (HOSPITAL_COMMUNITY): Payer: Self-pay

## 2024-11-26 ENCOUNTER — Emergency Department (HOSPITAL_BASED_OUTPATIENT_CLINIC_OR_DEPARTMENT_OTHER): Admission: EM | Admit: 2024-11-26 | Discharge: 2024-11-26 | Disposition: A | Discharge status: Home / Self Care

## 2024-11-26 DIAGNOSIS — E860 Dehydration: Secondary | ICD-10-CM | POA: Insufficient documentation

## 2024-11-26 DIAGNOSIS — R21 Rash and other nonspecific skin eruption: Secondary | ICD-10-CM | POA: Insufficient documentation

## 2024-11-26 LAB — RESP PANEL BY RT-PCR (RSV, FLU A&B, COVID)  RVPGX2
Influenza A by PCR: NEGATIVE
Influenza B by PCR: NEGATIVE
Resp Syncytial Virus by PCR: NEGATIVE
SARS Coronavirus 2 by RT PCR: NEGATIVE

## 2024-11-26 LAB — COMPREHENSIVE METABOLIC PANEL WITH GFR
ALT: 64 U/L — ABNORMAL HIGH (ref 0–44)
AST: 31 U/L (ref 15–41)
Albumin: 4 g/dL (ref 3.5–5.0)
Alkaline Phosphatase: 85 U/L (ref 38–126)
Anion gap: 15 (ref 5–15)
BUN: 8 mg/dL (ref 6–20)
CO2: 24 mmol/L (ref 22–32)
Calcium: 8.9 mg/dL (ref 8.9–10.3)
Chloride: 98 mmol/L (ref 98–111)
Creatinine, Ser: 1.27 mg/dL — ABNORMAL HIGH (ref 0.61–1.24)
GFR, Estimated: >60 mL/min
Glucose, Bld: 99 mg/dL (ref 70–99)
Potassium: 4 mmol/L (ref 3.5–5.1)
Sodium: 138 mmol/L (ref 135–145)
Total Bilirubin: 1.1 mg/dL (ref 0.0–1.2)
Total Protein: 7 g/dL (ref 6.5–8.1)

## 2024-11-26 LAB — CBC
HCT: 45.5 % (ref 39.0–52.0)
Hemoglobin: 15.6 g/dL (ref 13.0–17.0)
MCH: 28.2 pg (ref 26.0–34.0)
MCHC: 34.3 g/dL (ref 30.0–36.0)
MCV: 82.3 fL (ref 80.0–100.0)
Platelets: 332 K/uL (ref 150–400)
RBC: 5.53 MIL/uL (ref 4.22–5.81)
RDW: 15.4 % (ref 11.5–15.5)
WBC: 9.4 K/uL (ref 4.0–10.5)
nRBC: 0 % (ref 0.0–0.2)

## 2024-11-26 LAB — LIPASE, BLOOD: Lipase: 31 U/L (ref 11–51)

## 2024-11-26 MED ORDER — LACTATED RINGERS IV BOLUS
1000.0000 mL | Freq: Once | INTRAVENOUS | Status: AC
  Administered 2024-11-26: 1000 mL via INTRAVENOUS

## 2024-11-26 NOTE — ED Provider Notes (Signed)
 " [place] EMERGENCY DEPARTMENT AT [facility] Provider Note   CSN: [id number] Arrival date & time: 11/26/24  1547     Patient presents with: Fatigue   Michael [name] is a 59 y.o. male with history of stage IV renal cell carcinoma with pulmonary metastasis currently on tivozanib, presents with concern for a week of increased fatigue, subjective chills, and a rash that appeared on his chest, arms, and legs.  Denies any skin blistering or peeling or any involvement of mucosal surfaces.  Denies any fevers, cough.  He reports that he was recently tapered off of a 14-month long prednisone  course and this seemed to correspond with when the rash started.  He self discontinued the Tivozanib last week when symptoms flared up without improvement in symptoms.  His oncologist was notified about his symptoms and prescribed him a Medrol  Dosepak which he has not started yet.   HPI     Prior to Admission medications  Medication Sig Start Date End Date Taking? Authorizing Provider  amLODipine  (NORVASC ) 10 MG tablet Take 10 mg by mouth daily.    [provider]  amLODipine  (NORVASC ) 5 MG tablet Take 7.5 mg by mouth daily before breakfast. 04/23/21   [provider]  levothyroxine  (SYNTHROID ) 150 MCG tablet TAKE 1 TABLET(150 MCG) BY MOUTH DAILY BEFORE BREAKFAST 10/17/24   [name], MD  LORazepam  (ATIVAN ) 1 MG tablet Take 1 tablet (1 mg total) by mouth as directed. Take one tablet 30 minutes prior to MRI scans and may repeat once, just prior to scan if needed. 11/15/24   Bruning, Ashlyn, PA-C  methylPREDNISolone  (MEDROL  DOSEPAK) 4 MG TBPK tablet Take per package instructions 11/25/24   [name], MD  montelukast  (SINGULAIR ) 10 MG tablet Take 10 mg by mouth daily before breakfast.    [provider]  tadalafil (CIALIS) 20 MG tablet Take 10-20 mg by mouth daily as needed for erectile dysfunction.    [provider]  tivozanib hcl (FOTIVDA ) 1.34 MG capsule  Take 1 capsule (1.34 mg total) by mouth daily. Take for 21 days on, then off for 7 days. Repeat every 28 days. 09/08/24   [name], MD    Allergies: Cleocin [clindamycin], Penicillins, and Tape    Review of Systems  Skin:  Positive for rash.    Updated Vital Signs BP (!) 116/90 (BP Location: Right Arm)   Pulse 99   Temp 98.6 F (37 C)   Resp 18   SpO2 94%   Physical Exam Vitals and nursing note reviewed.  Constitutional:      General: He is not in acute distress.    Appearance: He is well-developed.  HENT:     Head: Normocephalic and atraumatic.     Mouth/Throat:     Mouth: Mucous membranes are dry.     Comments: Tongue dry. Eyes:     Conjunctiva/sclera: Conjunctivae normal.  Cardiovascular:     Rate and Rhythm: Normal rate and regular rhythm.     Heart sounds: No murmur heard. Pulmonary:     Effort: Pulmonary effort is normal. No respiratory distress.     Breath sounds: Normal breath sounds.  Abdominal:     Palpations: Abdomen is soft.     Tenderness: There is no abdominal tenderness.  Musculoskeletal:        General: No swelling.     Cervical back: Neck supple.  Skin:    General: Skin is warm and dry.     Capillary Refill: Capillary refill takes less  than 2 seconds.     Comments: Erythematous papules to the chest wall, back, arms, and calfs bilaterally.  No involvement of palms or soles.  No skin blistering or peeling.  No involvement of mucosal surfaces.  Neurological:     Mental Status: He is alert.  Psychiatric:        Mood and Affect: Mood normal.            (all labs ordered are listed, but only abnormal results are displayed) Labs Reviewed  COMPREHENSIVE METABOLIC PANEL WITH GFR - Abnormal; Notable for the following components:      Result Value   Creatinine, Ser 1.27 (*)    ALT 64 (*)    All other components within normal limits  RESP PANEL BY RT-PCR (RSV, FLU A&B, COVID)  RVPGX2  LIPASE, BLOOD  CBC    EKG: None  Radiology: No  results found.   Procedures   Medications Ordered in the ED  lactated ringers  bolus 1,000 mL (0 mLs Intravenous Stopped 11/26/24 1838)                                    Medical Decision Making Amount and/or Complexity of Data Reviewed Labs: ordered.     Differential diagnosis includes but is not limited to medication reaction, contact dermatitis, SJS, dress, COVID, flu, RSV, electrolyte abnormality, dehydration  ED Course:  Upon initial evaluation, patient is very well-appearing, stable vitals.  He has erythematous papules to the chest wall, arms, and legs that developed last week.  No skin blistering or peeling, no involvement of mucosal surfaces, no fever, doubt SJS.  He reports he just tapered off course of prednisone  and was also started on a new biologic for his chemotherapy.  Suspect that this could be concerning to the rash today.  No signs of cellulitis.  Patient reports that he has had some issues with his liver enzymes elevated on chemotherapy and is concern for possible elevation of liver enzymes today.  Labs Ordered: I Ordered, and personally interpreted labs.  The pertinent results include:   CBC within normal limits CMP with elevated creatinine 1.27, up from baseline of 1.02 taken 1 month ago.  ALT elevated at 64, but at baseline. Lipase within normal limits   Medications Given: LR bolus  Upon re-evaluation, patient remains well-appearing.  His liver enzymes are stable from prior.  He does not have any electrolyte abnormalities.  Normal blood counts.  He has an elevated creatinine of 1.27 which is up from 1.02 taken a month ago.  He does report decreased appetite over the past week and has not been eating as well as normal.  Suspect some mild dehydration and he was given an LR bolus here to help with this.  He denies any nausea or vomiting that is contributing to his decreased appetite.  No abdominal pain.  Reports subjective chills at home, but no fever here.  COVID,  flu, RSV testing negative.  Denies any cough, fevers, lungs clear, doubt pneumonia. Reviewed case with my attending Dr. Simon who feels patient is appropriate for outpatient follow-up with his oncologist next week.  We will have him continue on the Medrol  Dosepak as prescribed by his oncologist for his rash.    Impression: Rash, possible medication reaction  Disposition:  The patient was discharged home with instructions to  start taking medrol  dose pack as prescribed by his oncologist. Attend his oncology  appointment next week.  Return precautions given and patient verbalized understanding.    Record Review: External records from outside source obtained and reviewed including phone call to his oncologist earlier today where they started him on Medrol  Dosepak and will schedule appointment for next week.     This chart was dictated using voice recognition software, Dragon. Despite the best efforts of this provider to proofread and correct errors, errors may still occur which can change documentation meaning.       Final diagnoses:  Rash  Dehydration    ED Discharge Orders     None          Veta Palma, PA-C 11/26/24 1844    Simon Lavonia SAILOR, MD 11/26/24 2040  "

## 2024-11-26 NOTE — ED Triage Notes (Addendum)
 Patient reports fatigue, chills, and rash to bilateral arms, legs, and chest. Reports last time this happened his liver enzymes were elevated. Patient suspects it is a reaction to Fotivda  medication which he discontinued taking on Monday. Cancer patient.

## 2024-11-26 NOTE — Discharge Instructions (Addendum)
 Please continue taking the Medrol  Dosepak as prescribed by your oncologist for your rash.  Please attend your follow-up appointment with your oncologist next week.  You were slightly dehydrated today.  You are given some fluids to help with this.  Please increase your water intake at home.  Your ALT was slightly elevated at 64, but this seems to be consistent with prior labs.  Your other liver enzymes are normal.  Normal electrolytes and blood counts.  Your COVID, flu, RSV testing is negative.  Please return to the ER for any skin blistering or peeling, any rash on your lips or in your mouth, and any unexplained fevers, any other new or concerning symptoms

## 2024-11-26 NOTE — Telephone Encounter (Signed)
 Oral Oncology Patient Advocate Encounter   Was successful in obtaining a copay card for Fotivda .  This copay card will make the patients copay $0.   The billing information is as follows and has been shared with [facility].   RxBin: [id number] PCN: [id number] Member ID: [id number] Group ID: [id number]   Lucie Lamer, CPhT [place]  [facility] Oncology Pharmacy Patient Advocate Specialist [facility] [place] Phone: [phone]  Fax: [phone] [email or web address]

## 2024-11-28 ENCOUNTER — Other Ambulatory Visit (HOSPITAL_COMMUNITY): Payer: Self-pay

## 2024-11-29 ENCOUNTER — Other Ambulatory Visit: Payer: Self-pay

## 2024-12-01 ENCOUNTER — Encounter: Payer: Self-pay | Admitting: Nurse Practitioner

## 2024-12-01 ENCOUNTER — Ambulatory Visit: Admitting: Gastroenterology

## 2024-12-01 ENCOUNTER — Other Ambulatory Visit: Payer: Self-pay

## 2024-12-01 ENCOUNTER — Encounter: Payer: Self-pay | Admitting: Gastroenterology

## 2024-12-01 ENCOUNTER — Inpatient Hospital Stay

## 2024-12-01 ENCOUNTER — Inpatient Hospital Stay: Admitting: Nurse Practitioner

## 2024-12-01 VITALS — BP 147/101 | HR 51 | Temp 98.0°F | Resp 17 | Wt 253.1 lb

## 2024-12-01 VITALS — BP 150/90 | HR 74 | Ht 66.0 in | Wt 252.0 lb

## 2024-12-01 DIAGNOSIS — R21 Rash and other nonspecific skin eruption: Secondary | ICD-10-CM | POA: Diagnosis not present

## 2024-12-01 DIAGNOSIS — C649 Malignant neoplasm of unspecified kidney, except renal pelvis: Secondary | ICD-10-CM

## 2024-12-01 DIAGNOSIS — K5900 Constipation, unspecified: Secondary | ICD-10-CM

## 2024-12-01 DIAGNOSIS — Z5112 Encounter for antineoplastic immunotherapy: Secondary | ICD-10-CM

## 2024-12-01 DIAGNOSIS — C641 Malignant neoplasm of right kidney, except renal pelvis: Secondary | ICD-10-CM

## 2024-12-01 DIAGNOSIS — R7401 Elevation of levels of liver transaminase levels: Secondary | ICD-10-CM

## 2024-12-01 DIAGNOSIS — Z1211 Encounter for screening for malignant neoplasm of colon: Secondary | ICD-10-CM

## 2024-12-01 LAB — CBC WITH DIFFERENTIAL (CANCER CENTER ONLY)
Abs Immature Granulocytes: 0.08 10*3/uL — ABNORMAL HIGH (ref 0.00–0.07)
Basophils Absolute: 0.1 10*3/uL (ref 0.0–0.1)
Basophils Relative: 1 %
Eosinophils Absolute: 0.1 10*3/uL (ref 0.0–0.5)
Eosinophils Relative: 1 %
HCT: 47 % (ref 39.0–52.0)
Hemoglobin: 15.8 g/dL (ref 13.0–17.0)
Immature Granulocytes: 1 %
Lymphocytes Relative: 17 %
Lymphs Abs: 1.4 10*3/uL (ref 0.7–4.0)
MCH: 28.2 pg (ref 26.0–34.0)
MCHC: 33.6 g/dL (ref 30.0–36.0)
MCV: 83.8 fL (ref 80.0–100.0)
Monocytes Absolute: 0.7 10*3/uL (ref 0.1–1.0)
Monocytes Relative: 9 %
Neutro Abs: 5.6 10*3/uL (ref 1.7–7.7)
Neutrophils Relative %: 71 %
Platelet Count: 401 10*3/uL — ABNORMAL HIGH (ref 150–400)
RBC: 5.61 MIL/uL (ref 4.22–5.81)
RDW: 14.8 % (ref 11.5–15.5)
WBC Count: 7.9 10*3/uL (ref 4.0–10.5)
nRBC: 0.3 % — ABNORMAL HIGH (ref 0.0–0.2)

## 2024-12-01 LAB — CMP (CANCER CENTER ONLY)
ALT: 40 U/L (ref 0–44)
AST: 35 U/L (ref 15–41)
Albumin: 4.1 g/dL (ref 3.5–5.0)
Alkaline Phosphatase: 84 U/L (ref 38–126)
Anion gap: 11 (ref 5–15)
BUN: 13 mg/dL (ref 6–20)
CO2: 29 mmol/L (ref 22–32)
Calcium: 9.7 mg/dL (ref 8.9–10.3)
Chloride: 102 mmol/L (ref 98–111)
Creatinine: 1.16 mg/dL (ref 0.61–1.24)
GFR, Estimated: >60 mL/min
Glucose, Bld: 86 mg/dL (ref 70–99)
Potassium: 4 mmol/L (ref 3.5–5.1)
Sodium: 141 mmol/L (ref 135–145)
Total Bilirubin: 0.4 mg/dL (ref 0.0–1.2)
Total Protein: 7.1 g/dL (ref 6.5–8.1)

## 2024-12-01 LAB — TSH: TSH: 6.25 u[IU]/mL — ABNORMAL HIGH (ref 0.350–4.500)

## 2024-12-01 NOTE — Progress Notes (Signed)
 "     [facility]   [phone] [phone]    Patient Care Team: [name], MD as PCP - General (Internal Medicine)   CHIEF COMPLAINT: Fatigue, low appetite, rash  CURRENT THERAPY: Tivozanib 1.34 mg daily 3 weeks on/1 week off for metastatic RCC  INTERVAL HISTORY [name] presents to [facility] for fatigue, low appetite, feeling cold, and rash starting about 10 days into cycle 3 while on a work trip.  He did not eat much, and fatigue was moderate to severe where he could hardly participate in the meeting.  He developed an itching rash to arms, legs, and chest; a Medrol  Dosepak was called in, he has 2 days left.  He was concerned for liver dysfunction so he went to the ED 1/23, labs looked okay, he was hydrated.  He stopped tivozanib about a week early (completed 2 weeks of cycle 3). Overall he is feeling much better, 95 to 99% back to baseline.  ROS  All other systems reviewed and negative  Past Medical History:  Diagnosis Date   Cancer (HCC)    Renal cell carcinoma   Clear cell renal cell carcinoma (HCC)    Drug-induced pneumonitis 10/31/2024   Hypertension    Hypothyroidism    Sleep apnea    wears cpap     Past Surgical History:  Procedure Laterality Date   APPLICATION OF CRANIAL NAVIGATION N/A 10/13/2023   Procedure: APPLICATION OF CRANIAL NAVIGATION;  Surgeon: Lanis Pupa, MD;  Location: [facility] OR;  Service: Neurosurgery;  Laterality: N/A;   CRANIOTOMY N/A 10/13/2023   Procedure: SUBOCCIPITAL   CRANIOTOMY TUMOR EXCISION;  Surgeon: Lanis Pupa, MD;  Location: [facility] OR;  Service: Neurosurgery;  Laterality: N/A;   IR RADIOLOGIST EVAL & MGMT  04/04/2021   no procedures performed   IR RADIOLOGIST EVAL & MGMT  05/08/2021   no procedures performed   IR RADIOLOGIST EVAL & MGMT  08/12/2022   IR RADIOLOGIST EVAL & MGMT  05/24/2024   LUNG BIOPSY  2021   RADIOLOGY WITH ANESTHESIA N/A 05/30/2021   Procedure: CT WITH ANESTHESIA CRYOABLATION;  Surgeon:  Luverne Aran, MD;  Location: [facility] ORS;  Service: Radiology;  Laterality: N/A;   right nephrectomy Right 08/2019   TONSILLECTOMY     VIDEO BRONCHOSCOPY Bilateral 09/01/2024   Procedure: BRONCHOSCOPY, WITH FLUOROSCOPY;  Surgeon: Mannam, Praveen, MD;  Location: [facility] ENDOSCOPY;  Service: Cardiopulmonary;  Laterality: Bilateral;     Outpatient Encounter Medications as of 12/01/2024  Medication Sig   amLODipine  (NORVASC ) 10 MG tablet Take 10 mg by mouth daily.   amLODipine  (NORVASC ) 5 MG tablet Take 7.5 mg by mouth daily before breakfast.   levothyroxine  (SYNTHROID ) 150 MCG tablet TAKE 1 TABLET(150 MCG) BY MOUTH DAILY BEFORE BREAKFAST   LORazepam  (ATIVAN ) 1 MG tablet Take 1 tablet (1 mg total) by mouth as directed. Take one tablet 30 minutes prior to MRI scans and may repeat once, just prior to scan if needed.   methylPREDNISolone  (MEDROL  DOSEPAK) 4 MG TBPK tablet Take per package instructions   montelukast  (SINGULAIR ) 10 MG tablet Take 10 mg by mouth daily before breakfast.   tadalafil (CIALIS) 20 MG tablet Take 10-20 mg by mouth daily as needed for erectile dysfunction.   tivozanib hcl (FOTIVDA ) 1.34 MG capsule Take 1 capsule (1.34 mg total) by mouth daily. Take for 21 days on, then off for 7 days. Repeat every 28 days.   No facility-administered encounter medications on file as of 12/01/2024.  Today's Vitals   12/01/24 0946 12/01/24 0947 12/01/24 0948  BP: (!) 159/107 (!) 147/101   Pulse: (!) 51    Resp: 17    Temp: 98 F (36.7 C)    SpO2: 99%    Weight: 253 lb 1.6 oz (114.8 kg)    PainSc:   0-No pain   Body mass index is 40.85 kg/m.   ECOG PERFORMANCE STATUS: 1 - Symptomatic but completely ambulatory  PHYSICAL EXAM GENERAL:alert, no distress and comfortable SKIN: Dry flaky skin to arms and legs without obvious rash   EYES: sclera clear NECK: without mass LUNGS: clear with normal breathing effort HEART: regular rate & rhythm, no lower extremity edema ABDOMEN: abdomen soft,  non-tender and normal bowel sounds NEURO: alert & oriented x 3 with fluent speech, no focal motor/sensory deficits   CBC    Latest Ref Rng & Units 12/01/2024    9:16 AM 11/26/2024    4:10 PM 11/02/2024    7:43 AM  CBC  WBC 4.0 - 10.5 K/uL 7.9  9.4  8.4   Hemoglobin 13.0 - 17.0 g/dL 84.1  84.3  83.9   Hematocrit 39.0 - 52.0 % 47.0  45.5  46.8   Platelets 150 - 400 K/uL 401  332  244       CMP     Latest Ref Rng & Units 12/01/2024    9:16 AM 11/26/2024    4:10 PM 11/02/2024    7:43 AM  CMP  Glucose 70 - 99 mg/dL 86  99  892   BUN 6 - 20 mg/dL 13  8  16    Creatinine 0.61 - 1.24 mg/dL 8.83  8.72  8.76   Sodium 135 - 145 mmol/L 141  138  141   Potassium 3.5 - 5.1 mmol/L 4.0  4.0  3.8   Chloride 98 - 111 mmol/L 102  98  102   CO2 22 - 32 mmol/L 29  24  28    Calcium 8.9 - 10.3 mg/dL 9.7  8.9  9.4   Total Protein 6.5 - 8.1 g/dL 7.1  7.0  6.8   Total Bilirubin 0.0 - 1.2 mg/dL 0.4  1.1  0.5   Alkaline Phos 38 - 126 U/L 84  85  77   AST 15 - 41 U/L 35  31  37   ALT 0 - 44 U/L 40  64  61       ASSESSMENT & PLAN: 59 year old male with   #Stage IV clear-cell renal cell carcinoma initially diagnosed as locally advanced disease in 2020 with evidence of pulmonary metastasis in 2021. 1) status post right nephrectomy on August 27, 2019 in [place]  and the final pathology showed clear-cell renal cell carcinoma nuclear grade 3 with 10% necrosis and negative margin with the final pathologic stage of T2a, NX. 2) status post treatment with Keytruda  and axitinib  started March 2021 for 2 cycles interrupted because of autoimmune hepatitis.  3) axitinib  5 mg daily started July 05, 2020 discontinued after 1 week because of elevated LFTs. 4) status post cryoablation of left renal mass on May 30, 2021. 5) Keytruda  200 Mg IV every 3 weeks started July 27, 2021 and then switch it to 400 Mg every 6 weeks on November 09, 2021.  Last dose was given July 2024. 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 7) Nivolumab   480 mg IV every 4  weeks in addition to Cabometyx  40 mg p.o daily.  Cabometyx  was discontinued secondary to elevated liver enzyme and intolerance.  He is status post 7 cycles of treatment with nivolumab .  Last dose was given on August 16, 2024.  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 -currently on Tivozanib (Fotivda ) 1.34 mg p.o. daily for 21 days every 4 weeks.  This treatment started on September 11, 2024.   Dispo:  Mr. Szumski presents to [facility] with fatigue, low appetite, feeling cold, and rash that developed on cycle 3 day 10. He stopped Tivozanib and is completing medrol  dose pack. Hgb is normal, TSH pending. He is recovering well.   Unclear is presentation is secondary to Tivozanib toxicity vs other acute illness. Labs WNL today. He agrees to resume Tivozanib at the usual dose on 2/2. He will monitor for recurrent issues.   Follow up 2/11 with Dr. Sherrod as scheduled. We are available to see him sooner if needed.   Case discussed with Dr. Sherrod.    All questions were answered. The patient knows to call the clinic with any problems, questions or concerns. No barriers to learning were detected. I spent 20 minutes counseling the patient face to face. The total time spent in the appointment was 30 minutes and more than 50% was on counseling, review of test results, and coordination of care.   Taffie Eckmann K Kylo Gavin, NP 12/01/2024  "

## 2024-12-01 NOTE — Patient Instructions (Signed)
 Your provider has ordered Cologuard testing as an option for colon cancer screening. This is performed by [employer] and may be out of network with your insurance. PRIOR to completing the test, it is YOUR responsibility to contact your insurance about covered benefits for this test. Your out of pocket expense could be anywhere from $0.00 to $649.00.   When you call to check coverage with your insurer, please provide the following information:   -The ONLY provider of Cologuard is [facility], Dispensing  - CPT code for Cologuard is [phone].  [employer] NPI # [id number]  -[employer] Tax ID # [id number]   We have already sent your demographic and insurance information to [employer] (phone number [phone]) and they should contact you within the next week regarding your test. If you have not heard from them within the next week, please call our office at [phone].   Thank you for trusting me with your gastrointestinal care!    Dr. Victory Legrand D[facility]

## 2024-12-01 NOTE — Progress Notes (Signed)
 "     Michael [name]  Chief Complaint: Colorectal cancer screening, constipation  Subjective  Prior history  [facility] clinic visit with APP June 2025 for consideration of colorectal cancer screening and patient with metastatic renal cell cancer reportedly stable on immunotherapy.  Procedure was eventually scheduled for late December 2025, but anesthesia chart review determined that in the interim this patient had 2 hospitalizations for severe pneumonitis and was on a prolonged course of prednisone .  Followed up with pulmonary clinic 11/09/2024 (Dr. Theophilus), indicating patient had considerable respiratory improvement from the steroids alone and good exercise tolerance (about 10 days left on taper at that point) Respiratory issues were drug-induced pneumonitis from immunotherapy with nivolumab  which was changed to tivozanib  Patient seen in oncology follow-up by Dr. Sherrod on 11/10/2024: Recent pulmonary imaging showed improvement compared to October 2025, with a few small residual spots noted. He has no new respiratory symptoms and is nearing completion of a prednisone  taper, with approximately ten days remaining.    History of Present Illness   Michael [name] is here today for office evaluation and reconsideration of colon cancer screening. He has had significant clinical events regarding his cancer care since he was last seen here in June as described in the summary above.  What is also occurred with the last week was a visit to the ED immediately upon returning from a work trip because he was having a rash and concerns that he may have a reaction to his latest cancer treatment.  He felt the symptoms of rash and fatigue and chills were reminiscent of a previous medication reaction that led to increased LFTs.  Fortunately lab results were reassuring at the ED visit and he was given a Medrol  Dosepak.  He also has a visit to see Dr. Gatha later today.  He is tending toward constipation in the  last several months on his new medication, and trying to treat that with fiber water and activity.  He travels a lot for work which he has continued to do in the midst of being physically active despite his recent setbacks.  He says his respiratory condition has considerably improved and he is able to engage in exercise without difficulty. Denies rectal bleeding and has no family history of colorectal cancer  He also recalls being sent a Cologuard kit by his primary care about 2 years ago but never completed it ROS: Cardiovascular:  no chest pain Respiratory: no dyspnea or cough Rash on arms and legs improving on Medrol  Dosepak after recent ED visit  The patient's Past Medical, Family and Social History were reviewed and are on file in the EMR. Past Medical History:  Diagnosis Date   Cancer Michael [name])    Renal cell carcinoma   Clear cell renal cell carcinoma (HCC)    Drug-induced pneumonitis 10/31/2024   Hypertension    Hypothyroidism    Sleep apnea    wears cpap    Past Surgical History:  Procedure Laterality Date   APPLICATION OF CRANIAL NAVIGATION N/A 10/13/2023   Procedure: APPLICATION OF CRANIAL NAVIGATION;  Surgeon: Michael Pupa, MD;  Location: [facility] OR;  Service: Neurosurgery;  Laterality: N/A;   CRANIOTOMY N/A 10/13/2023   Procedure: SUBOCCIPITAL   CRANIOTOMY TUMOR EXCISION;  Surgeon: Michael Pupa, MD;  Location: [facility] OR;  Service: Neurosurgery;  Laterality: N/A;   IR RADIOLOGIST EVAL & MGMT  04/04/2021   no procedures performed   IR RADIOLOGIST EVAL & MGMT  05/08/2021   no procedures performed  IR RADIOLOGIST EVAL & MGMT  08/12/2022   IR RADIOLOGIST EVAL & MGMT  05/24/2024   LUNG BIOPSY  2021   RADIOLOGY WITH ANESTHESIA N/A 05/30/2021   Procedure: CT WITH ANESTHESIA CRYOABLATION;  Surgeon: Michael Aran, MD;  Location: [facility];  Service: Radiology;  Laterality: N/A;   right nephrectomy Right 08/2019   TONSILLECTOMY     VIDEO BRONCHOSCOPY Bilateral 09/01/2024    Procedure: BRONCHOSCOPY, WITH FLUOROSCOPY;  Surgeon: Michael [name], Praveen, MD;  Location: [facility];  Service: Cardiopulmonary;  Laterality: Bilateral;     Objective:  Med list reviewed Current Medications[1]   Vital signs in last 24 hrs: Vitals:   12/01/24 0836  BP: (!) 150/90  Pulse: 74  SpO2: 96%   Wt Readings from Last 3 Encounters:  12/01/24 252 lb (114.3 kg)  11/10/24 257 lb 14.4 oz (117 kg)  11/09/24 259 lb (117.5 kg)    Physical Exam  Well-appearing HEENT: sclera anicteric, oral mucosa moist without lesions Neck: supple, no thyromegaly, JVD or lymphadenopathy Cardiac: Regular without appreciable murmur,  no peripheral edema Pulm: clear to auscultation bilaterally, normal RR and effort noted Abdomen: soft, obese, no tenderness, with active bowel sounds. No guarding or palpable hepatosplenomegaly.   Labs:     Latest Ref Rng & Units 11/26/2024    4:10 PM 11/02/2024    7:43 AM 10/13/2024    9:39 AM  CBC  WBC 4.0 - 10.5 K/uL 9.4  8.4  8.5   Hemoglobin 13.0 - 17.0 g/dL 84.3  83.9  84.4   Hematocrit 39.0 - 52.0 % 45.5  46.8  47.2   Platelets 150 - 400 K/uL 332  244  215       Latest Ref Rng & Units 11/26/2024    4:10 PM 11/02/2024    7:43 AM 10/13/2024    9:39 AM  CMP  Glucose 70 - 99 mg/dL 99  892  93   BUN 6 - 20 mg/dL 8  16  19    Creatinine 0.61 - 1.24 mg/dL 8.72  8.76  8.97   Sodium 135 - 145 mmol/L 138  141  141   Potassium 3.5 - 5.1 mmol/L 4.0  3.8  3.9   Chloride 98 - 111 mmol/L 98  102  102   CO2 22 - 32 mmol/L 24  28  28    Calcium 8.9 - 10.3 mg/dL 8.9  9.4  9.1   Total Protein 6.5 - 8.1 g/dL 7.0  6.8  6.7   Total Bilirubin 0.0 - 1.2 mg/dL 1.1  0.5  0.5   Alkaline Phos 38 - 126 U/L 85  77  58   AST 15 - 41 U/L 31  37  33   ALT 0 - 44 U/L 64  61  61     ___________________________________________ Radiologic studies:  CT CHEST, ABDOMEN AND PELVIS WITHOUT CONTRAST 11/02/2024 08:23:48 AM   TECHNIQUE: CT of the chest, abdomen and pelvis was  performed without the administration of intravenous contrast. Multiplanar reformatted images are provided for review. Automated exposure control, iterative reconstruction, and/or weight based adjustment of the mA/kV was utilized to reduce the radiation dose to as low as reasonably achievable.   COMPARISON: 08/27/2024 and 08/05/2024.   CLINICAL HISTORY: Renal cell carcinoma status post right nephrectomy, with prior pulmonary metastatic disease and prior osseous intracranial metastatic disease. * Tracking Code: BO *   FINDINGS:   CHEST:   MEDIASTINUM AND LYMPH NODES: Heart and pericardium are unremarkable. The central airways are clear. No  mediastinal, hilar or axillary lymphadenopathy. Large lytic metastatic lesion of the right sternal manubrium again observed with surrounding inflammatory or therapy related findings along the medial right pectoralis muscle and adjacent subcutaneous tissues.   LUNGS AND PLEURA: Substantial reduction in prior biapical and paramediastinal airspace opacities, currently manifesting as volume loss and bandlike paramediastinal densities in the upper lobes. Previous lower lobe and inferior right middle lobe airspace opacities have primarily cleared with only some faint interstitial accentuation and potentially hazy alveolitis medially in the right lower lobe and laterally in the left lower lobe. Several small nodules measuring up to 4 mm in diameter are present in the right lower lobe, previously obscured by airspace opacities. No pleural effusion. No pneumothorax.   ABDOMEN AND PELVIS:   LIVER: Stable hepatic cysts.   GALLBLADDER AND BILE DUCTS: Unremarkable. No biliary ductal dilatation.   SPLEEN: No acute abnormality.   PANCREAS: No acute abnormality.   ADRENAL GLANDS: No acute abnormality.   KIDNEYS, URETERS AND BLADDER: Right nephrectomy without local recurrence. Likely benign lesion of the left mid kidney measuring about 1.4 cm in  diameter on image 68 series 2, no change from 08/05/2024. No stones in the kidneys or ureters. No hydronephrosis. No perinephric or periureteral stranding. Urinary bladder is unremarkable.   GI AND BOWEL: Stomach demonstrates no acute abnormality. There is no bowel obstruction.   REPRODUCTIVE ORGANS: No acute abnormality.   PERITONEUM AND RETROPERITONEUM: No ascites. No free air.   VASCULATURE: Aorta is normal in caliber.   ABDOMINAL AND PELVIS LYMPH NODES: No lymphadenopathy.   BONES AND SOFT TISSUES: Metastatic lesion along the anterior T9 vertebral body with mild extraosseous extension, measuring about 3.6 x 1.8 cm on image 41 series 2, stable. Suspected left lateral recess disc protrusion at the L4-L5 level. No acute osseous abnormality. No focal soft tissue abnormality.   IMPRESSION: 1. Stable lytic metastatic lesion of the right sternal manubrium with surrounding inflammatory or treatment-related changes along the medial right pectoralis muscle and adjacent subcutaneous tissues. Stable anterior T9 vertebral body metastatic lesion. 2. Substantial reduction of prior biapical and paramediastinal airspace opacities with resultant volume loss and bandlike paramediastinal densities in the upper lobes. 3. Previous lower lobe and inferior right middle lobe airspace opacities largely cleared with mild residual interstitial prominence and possible focal alveolitis medially in the right lower lobe and laterally in the left lower lobe. 4. Several small pulmonary nodules up to 4 mm in the right lower lobe. 5. Right nephrectomy without local recurrence. 6. Likely benign left mid renal lesion measuring approximately 1.4 cm, stable. 7. Stable hepatic cysts. 8. Suspected left lateral recess disc protrusion at L4-L5.   Electronically signed by: Ryan Salvage MD 11/09/2024 05:17 PM EST RP Workstation:  [id number] ____________________________________________ Other:   _____________________________________________   Encounter Diagnoses  Name Primary?   Acute constipation Yes   Special screening for malignant neoplasms, colon    Transaminitis     Assessment & Plan  Constipation that he believes is related to his cancer treatment. Recommended adequate dietary fiber (25 to 30 g/day), as needed use of MiraLAX , increase water intake.  Mild stable transaminitis with elevated LFT but this felt likely due to his underlying malignancy and/or treatment.    Colorectal cancer screening.  Average risk without family history. While his long-term prognosis and life expectancy are uncertain, he is currently clinically stable from the standpoint of his malignancy and now considerably improved from a respiratory standpoint after treatment of the drug-induced pneumonitis. He now has a  rash but we do not yet know for certain if it was related to his treatment and whether or not that will need to be changed.  I recommended he do a Cologuard at this point.  While it is less sensitive and specific than the colonoscopy regarding clinically significant polyps, it is otherwise risk-free compared to colonoscopy.  I believe he could undergo a colonoscopy with a positive result, though that would need to wait until March when he is at least 6 weeks away from the initial prednisone  taper given for his pneumonitis (per our anesthesia consultants recommendation). He is aware that a Cologuard could be positive for colorectal cancer, clinically significant polyps (most likely reason), occult blood in the stool or false positive.  After all that discussion he wished to do the Cologuard so a new order was sent for him and results will come to us .  I spent total of 32 minutes in both face-to-face (20 minutes interview/exam) and non-face-to-face (12 minutes chart review, care coordination, documentation)  activities,  excluding procedures performed, for the visit on the date of this encounter.   Victory LITTIE Brand III     [1]  Current Outpatient Medications:    amLODipine  (NORVASC ) 10 MG tablet, Take 10 mg by mouth daily., Disp: , Rfl:    amLODipine  (NORVASC ) 5 MG tablet, Take 7.5 mg by mouth daily before breakfast., Disp: , Rfl:    levothyroxine  (SYNTHROID ) 150 MCG tablet, TAKE 1 TABLET(150 MCG) BY MOUTH DAILY BEFORE BREAKFAST, Disp: 30 tablet, Rfl: 2   LORazepam  (ATIVAN ) 1 MG tablet, Take 1 tablet (1 mg total) by mouth as directed. Take one tablet 30 minutes prior to MRI scans and may repeat once, just prior to scan if needed., Disp: 30 tablet, Rfl: 0   methylPREDNISolone  (MEDROL  DOSEPAK) 4 MG TBPK tablet, Take per package instructions, Disp: 21 tablet, Rfl: 0   montelukast  (SINGULAIR ) 10 MG tablet, Take 10 mg by mouth daily before breakfast., Disp: , Rfl:    tadalafil (CIALIS) 20 MG tablet, Take 10-20 mg by mouth daily as needed for erectile dysfunction., Disp: , Rfl:    tivozanib hcl (FOTIVDA ) 1.34 MG capsule, Take 1 capsule (1.34 mg total) by mouth daily. Take for 21 days on, then off for 7 days. Repeat every 28 days., Disp: 21 capsule, Rfl: 3  "

## 2024-12-02 ENCOUNTER — Inpatient Hospital Stay

## 2024-12-02 ENCOUNTER — Inpatient Hospital Stay: Admitting: Nurse Practitioner

## 2024-12-02 LAB — T4: T4, Total: 8.4 ug/dL (ref 4.5–12.0)

## 2024-12-03 ENCOUNTER — Other Ambulatory Visit: Payer: Self-pay

## 2024-12-03 NOTE — Progress Notes (Signed)
 Specialty Pharmacy Refill Coordination Note  Michael [name] is a 59 y.o. male contacted today regarding refills of specialty medication(s) Tivozanib HCl (FOTIVDA )   Patient requested [name] at [facility] at [place] date: 12/03/24   Medication will be filled on: 12/03/24

## 2024-12-08 ENCOUNTER — Inpatient Hospital Stay: Admitting: Internal Medicine

## 2024-12-08 ENCOUNTER — Inpatient Hospital Stay: Attending: Oncology

## 2024-12-09 ENCOUNTER — Other Ambulatory Visit: Payer: Self-pay | Admitting: Interventional Radiology

## 2024-12-09 ENCOUNTER — Telehealth: Payer: Self-pay

## 2024-12-09 DIAGNOSIS — C642 Malignant neoplasm of left kidney, except renal pelvis: Secondary | ICD-10-CM

## 2024-12-09 NOTE — Progress Notes (Unsigned)
 Complex Care Management Note Care Guide Note  12/09/2024 Name: Michael [name] MRN: [id number] DOB: 07/06/1966   Complex Care Management Outreach Attempts: An unsuccessful telephone outreach was attempted today to offer the patient information about available complex care management services.  Follow Up Plan:  Additional outreach attempts will be made to offer the patient complex care management information and services.   Encounter Outcome:  No Answer  [facility]  [facility] Guide Direct Dial: [phone]  Fax: [phone]

## 2024-12-10 NOTE — Addendum Note (Signed)
 Encounter addended by: Marena Witts, PA-C on: 12/10/2024 3:06 PM  Actions taken: Clinical Note Signed

## 2024-12-15 ENCOUNTER — Inpatient Hospital Stay: Attending: Oncology

## 2024-12-15 ENCOUNTER — Inpatient Hospital Stay: Admitting: Internal Medicine

## 2025-01-27 ENCOUNTER — Ambulatory Visit (HOSPITAL_COMMUNITY)

## 2025-01-31 ENCOUNTER — Inpatient Hospital Stay: Attending: Oncology

## 2025-02-01 ENCOUNTER — Other Ambulatory Visit (HOSPITAL_COMMUNITY)

## 2025-02-02 ENCOUNTER — Ambulatory Visit: Admitting: Urology

## 2025-05-03 ENCOUNTER — Ambulatory Visit: Admitting: Pulmonary Disease
